# Patient Record
Sex: Female | Born: 1950
Health system: Southern US, Community
[De-identification: ages and names within clinical notes are randomized; demographics above are authoritative.]

## PROBLEM LIST (undated history)

## (undated) DIAGNOSIS — M810 Age-related osteoporosis without current pathological fracture: Secondary | ICD-10-CM

## (undated) DIAGNOSIS — I509 Heart failure, unspecified: Secondary | ICD-10-CM

## (undated) DIAGNOSIS — K219 Gastro-esophageal reflux disease without esophagitis: Secondary | ICD-10-CM

## (undated) DIAGNOSIS — R011 Cardiac murmur, unspecified: Secondary | ICD-10-CM

## (undated) DIAGNOSIS — H269 Unspecified cataract: Secondary | ICD-10-CM

## (undated) DIAGNOSIS — I10 Essential (primary) hypertension: Secondary | ICD-10-CM

## (undated) DIAGNOSIS — C801 Malignant (primary) neoplasm, unspecified: Secondary | ICD-10-CM

## (undated) DIAGNOSIS — T7840XA Allergy, unspecified, initial encounter: Secondary | ICD-10-CM

## (undated) DIAGNOSIS — I2510 Atherosclerotic heart disease of native coronary artery without angina pectoris: Secondary | ICD-10-CM

## (undated) DIAGNOSIS — E079 Disorder of thyroid, unspecified: Secondary | ICD-10-CM

## (undated) DIAGNOSIS — E785 Hyperlipidemia, unspecified: Secondary | ICD-10-CM

## (undated) DIAGNOSIS — M1990 Unspecified osteoarthritis, unspecified site: Secondary | ICD-10-CM

## (undated) HISTORY — DX: Allergy, unspecified, initial encounter: T78.40XA

## (undated) HISTORY — DX: Essential (primary) hypertension: I10

## (undated) HISTORY — PX: APPENDECTOMY: SHX54

## (undated) HISTORY — PX: CATARACT EXTRACTION: SUR2

## (undated) HISTORY — DX: Gastro-esophageal reflux disease without esophagitis: K21.9

## (undated) HISTORY — DX: Unspecified osteoarthritis, unspecified site: M19.90

## (undated) HISTORY — DX: Hyperlipidemia, unspecified: E78.5

## (undated) HISTORY — PX: CORONARY ANGIOPLASTY WITH STENT PLACEMENT: SHX49

## (undated) HISTORY — DX: Malignant (primary) neoplasm, unspecified: C80.1

## (undated) HISTORY — PX: COLONOSCOPY: SHX174

## (undated) HISTORY — DX: Cardiac murmur, unspecified: R01.1

## (undated) HISTORY — DX: Age-related osteoporosis without current pathological fracture: M81.0

## (undated) HISTORY — DX: Unspecified cataract: H26.9

## (undated) HISTORY — DX: Disorder of thyroid, unspecified: E07.9

## (undated) HISTORY — PX: ABDOMINAL HYSTERECTOMY: SHX81

---

## 2003-04-19 HISTORY — PX: CARDIAC ELECTROPHYSIOLOGY MAPPING AND ABLATION: SHX1292

## 2011-04-08 DIAGNOSIS — M341 CR(E)ST syndrome: Secondary | ICD-10-CM | POA: Insufficient documentation

## 2012-09-22 DIAGNOSIS — M1990 Unspecified osteoarthritis, unspecified site: Secondary | ICD-10-CM | POA: Insufficient documentation

## 2012-09-22 DIAGNOSIS — I7300 Raynaud's syndrome without gangrene: Secondary | ICD-10-CM | POA: Insufficient documentation

## 2014-12-05 DIAGNOSIS — F488 Other specified nonpsychotic mental disorders: Secondary | ICD-10-CM | POA: Insufficient documentation

## 2014-12-05 DIAGNOSIS — R278 Other lack of coordination: Secondary | ICD-10-CM | POA: Insufficient documentation

## 2016-05-22 ENCOUNTER — Ambulatory Visit: Payer: Self-pay | Admitting: Family Medicine

## 2016-05-23 ENCOUNTER — Ambulatory Visit: Payer: Self-pay | Admitting: Family Medicine

## 2016-05-27 DIAGNOSIS — M65332 Trigger finger, left middle finger: Secondary | ICD-10-CM | POA: Diagnosis not present

## 2016-05-27 DIAGNOSIS — M341 CR(E)ST syndrome: Secondary | ICD-10-CM | POA: Diagnosis not present

## 2016-05-27 DIAGNOSIS — I7300 Raynaud's syndrome without gangrene: Secondary | ICD-10-CM | POA: Diagnosis not present

## 2016-05-27 DIAGNOSIS — M1990 Unspecified osteoarthritis, unspecified site: Secondary | ICD-10-CM | POA: Diagnosis not present

## 2016-05-29 DIAGNOSIS — L600 Ingrowing nail: Secondary | ICD-10-CM | POA: Diagnosis not present

## 2016-05-29 DIAGNOSIS — S86912A Strain of unspecified muscle(s) and tendon(s) at lower leg level, left leg, initial encounter: Secondary | ICD-10-CM | POA: Diagnosis not present

## 2016-05-29 DIAGNOSIS — M25562 Pain in left knee: Secondary | ICD-10-CM | POA: Diagnosis not present

## 2016-05-30 DIAGNOSIS — M7652 Patellar tendinitis, left knee: Secondary | ICD-10-CM | POA: Diagnosis not present

## 2016-06-16 DIAGNOSIS — Z79899 Other long term (current) drug therapy: Secondary | ICD-10-CM | POA: Insufficient documentation

## 2016-06-16 DIAGNOSIS — M8589 Other specified disorders of bone density and structure, multiple sites: Secondary | ICD-10-CM | POA: Insufficient documentation

## 2016-06-16 DIAGNOSIS — M349 Systemic sclerosis, unspecified: Secondary | ICD-10-CM | POA: Insufficient documentation

## 2016-06-16 NOTE — Progress Notes (Signed)
Office Visit Note  Patient: Rhonda Brewer             Date of Birth: September 01, 1950           MRN: [id number]             PCP: Zigmund Gottron, MD Referring: Gwyndolyn Kaufman, MD Visit Date: 06/19/2016 Occupation: Retired Scientist, water quality    Subjective:  Pain hands   History of Present Illness: Rhonda Brewer is a 66 y.o. female seen in consultation per request of her PCP. According to patient in 2008 she was diagnosed with uterine cancer and was treated with hysterectomy, chemotherapy and radiation therapy. She states after that she developed swelling in her bilateral hands. She was referred to a rheumatologist while she was living in [place]. She was diagnosed with scleroderma and was started on Plaquenil. She states she did not require any other treatment. She had labs and eye exams every 6 months. Her Raynauds is only active when she is exposed to cold weather. She denies any reflux symptoms. She moved to [facility] in January 2018. She states she continues to have some stiffness in her hands and some Raynaud's phenomenon. She was also having problems with left third trigger finger which is improved by itself.  Activities of Daily Living:  Patient reports morning stiffness for 5 minutes.   Patient Denies nocturnal pain.  Difficulty dressing/grooming: Denies Difficulty climbing stairs: Denies Difficulty getting out of chair: Denies Difficulty using hands for taps, buttons, cutlery, and/or writing: Reports   Review of Systems  Constitutional: Negative for fatigue, night sweats, weight gain, weight loss and weakness.  HENT: Negative for mouth sores, trouble swallowing, trouble swallowing, mouth dryness and nose dryness.   Eyes: Positive for dryness. Negative for pain, redness and visual disturbance.  Respiratory: Negative for cough, shortness of breath and difficulty breathing.   Cardiovascular: Negative for chest pain, palpitations, hypertension, irregular heartbeat and  swelling in legs/feet.  Gastrointestinal: Negative for blood in stool, constipation and diarrhea.  Endocrine: Negative for increased urination.  Genitourinary: Negative for vaginal dryness.  Musculoskeletal: Positive for arthralgias, joint pain and morning stiffness. Negative for joint swelling, myalgias, muscle weakness, muscle tenderness and myalgias.  Skin: Positive for color change and skin tightness. Negative for rash, hair loss, ulcers and sensitivity to sunlight.  Allergic/Immunologic: Negative for susceptible to infections.  Neurological: Negative for dizziness, memory loss and night sweats.  Hematological: Negative for swollen glands.  Psychiatric/Behavioral: Negative for depressed mood and sleep disturbance. The patient is not nervous/anxious.     PMFS History:  Patient Active Problem List   Diagnosis Date Noted  . History of coronary artery disease 06/19/2016  . History of hypothyroidism 06/19/2016  . History of uterine cancer 06/19/2016  . Scleroderma ([place]) 06/16/2016  . High risk medication use 06/16/2016  . Osteopenia of multiple sites 06/16/2016    Past Medical History:  Diagnosis Date  . [facility])    Uterine 2008    No family history on file. Past Surgical History:  Procedure Laterality Date  . ABDOMINAL HYSTERECTOMY    . APPENDECTOMY    . CARDIAC ELECTROPHYSIOLOGY MAPPING AND ABLATION  04/19/2003  . CORONARY ANGIOPLASTY WITH STENT PLACEMENT     Social History   Social History Narrative  . No narrative on file     Objective: Vital Signs: BP 102/68   Pulse 60   Resp 16   Ht '5\' 4"'$  (1.626 m)   Wt 140 lb (63.5 kg)   BMI 24.03 kg/m  Physical Exam  Constitutional: She is oriented to person, place, and time. She appears well-developed and well-nourished.  HENT:  Head: Normocephalic and atraumatic.  Eyes: Conjunctivae and EOM are normal.  Neck: Normal range of motion.  Cardiovascular: Normal rate, regular rhythm, normal heart sounds and intact  distal pulses.   Pulmonary/Chest: Effort normal and breath sounds normal.  Abdominal: Soft. Bowel sounds are normal.  Lymphadenopathy:    She has no cervical adenopathy.  Neurological: She is alert and oriented to person, place, and time.  Skin: Skin is warm and dry. Capillary refill takes 2 to 3 seconds.  sclerodactyly noted on bilateral hands distal to MCPs. 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  Psychiatric: She has a normal mood and affect. Her behavior is normal.  Nursing note and vitals reviewed.    Musculoskeletal Exam: C-spine and thoracic lumbar spine good range of motion. No SI joint tenderness noted. Shoulder joints elbow joints wrist joint MCPs PIPs DIPs with good range of motion. She has some thickening of PIP joints consistent with osteoarthritis. Hip joints knee joints ankles MTPs PIPs with good range of motion with no synovitis.  CDAI Exam: No CDAI exam completed.    Investigation: Findings:  11/04/2015 ANA 1:2560 centromere,(dsDNA, SSA, SSB, Smith, RNP, SCL 70, Jo 1, chromatin negative), ESR 2, CRP less than 0.3, 08/31/2014 CMP normal, CBC normal, CK normal, anti-CCP antibody negative    Imaging: Xr Hand 2 View Left  Result Date: 06/19/2016 PIP DIP narrowing were noted. There was questionable erosion over right second and and fifth PIP joint. Impression: These findings are consistent with osteoarthritis and inflammatory arthritis.  Xr Hand 2 View Right  Result Date: 06/19/2016 Possible erosion noted over first distal phalanx minimal PIP/DIP narrowing noted. No MCP or intercarpal joint space narrowing was noted. Impression: These findings are consistent with osteoarthritis and inflammatory arthritis.   Speciality Comments: No specialty comments available.    Procedures:  No procedures performed Allergies: Avelox [moxifloxacin hcl in nacl]   Assessment / Plan:     Visit Diagnoses: Scleroderma ([place]) - Limited systemic with Raynauds,  Telengectesia's, sclerodactyly, arthralgias, erosions in right fifth and left third DIP, ANA centromere -she is sclerodactyly only distal to MCPs. I'll obtain some basic labs today. Plan: Urinalysis, Routine w reflex microscopic. I'll also make referral for cardiology and pulmonary evaluation.  High risk medication use - Hydroxychloroquine 200 mg twice a day , I advised patient to reduce her Plaquenil to 200 mg twice a day Monday to Friday is tolerable height. She's been getting eye exams every 6 months. I've advised her to establish with ophthalmologist.- Plan: CBC with Differential/Platelet, COMPLETE METABOLIC PANEL WITH GFR  Pain in both hands, she had no synovitis on examination today. - Plan: XR Hand 2 View Right, XR Hand 2 View Left  Osteopenia of multiple sites - T score -1.1 lumbar 2014: She is on supplements.  History of coronary artery disease -  status post stent  History of hypothyroidism  History of uterine cancer - 2008, status post hysterectomy, chemotherapy, radiation therapy    Orders: Orders Placed This Encounter  Procedures  . XR Hand 2 View Right  . XR Hand 2 View Left  . CBC with Differential/Platelet  . COMPLETE METABOLIC PANEL WITH GFR  . Urinalysis, Routine w reflex microscopic  . CBC with Differential/Platelet  . COMPLETE METABOLIC PANEL WITH GFR  . Ambulatory referral to Pulmonology  . AMB referral to CHF clinic   No orders of the defined  types were placed in this encounter.   Face-to-face time spent with patient was 45 minutes. 50% of time was spent in counseling and coordination of care.  Follow-Up Instructions: Return in about 6 months (around 12/19/2016) for Scleroderma.   Bo Merino, MD  Note - This record has been created using Editor, commissioning.  Chart creation errors have been sought, but may not always  have been located. Such creation errors do not reflect on  the standard of medical care.

## 2016-06-19 ENCOUNTER — Ambulatory Visit (INDEPENDENT_AMBULATORY_CARE_PROVIDER_SITE_OTHER): Payer: Medicare Other

## 2016-06-19 ENCOUNTER — Ambulatory Visit (INDEPENDENT_AMBULATORY_CARE_PROVIDER_SITE_OTHER): Payer: Medicare Other | Admitting: Rheumatology

## 2016-06-19 ENCOUNTER — Ambulatory Visit (INDEPENDENT_AMBULATORY_CARE_PROVIDER_SITE_OTHER): Payer: Self-pay

## 2016-06-19 ENCOUNTER — Encounter: Payer: Self-pay | Admitting: Rheumatology

## 2016-06-19 VITALS — BP 102/68 | HR 60 | Resp 16 | Ht 64.0 in | Wt 140.0 lb

## 2016-06-19 DIAGNOSIS — Z8542 Personal history of malignant neoplasm of other parts of uterus: Secondary | ICD-10-CM | POA: Diagnosis not present

## 2016-06-19 DIAGNOSIS — M8589 Other specified disorders of bone density and structure, multiple sites: Secondary | ICD-10-CM

## 2016-06-19 DIAGNOSIS — M79642 Pain in left hand: Secondary | ICD-10-CM

## 2016-06-19 DIAGNOSIS — Z8639 Personal history of other endocrine, nutritional and metabolic disease: Secondary | ICD-10-CM | POA: Diagnosis not present

## 2016-06-19 DIAGNOSIS — Z79899 Other long term (current) drug therapy: Secondary | ICD-10-CM | POA: Diagnosis not present

## 2016-06-19 DIAGNOSIS — Z8679 Personal history of other diseases of the circulatory system: Secondary | ICD-10-CM | POA: Diagnosis not present

## 2016-06-19 DIAGNOSIS — M79641 Pain in right hand: Secondary | ICD-10-CM

## 2016-06-19 DIAGNOSIS — M349 Systemic sclerosis, unspecified: Secondary | ICD-10-CM

## 2016-06-19 DIAGNOSIS — I2510 Atherosclerotic heart disease of native coronary artery without angina pectoris: Secondary | ICD-10-CM | POA: Insufficient documentation

## 2016-06-19 LAB — COMPLETE METABOLIC PANEL WITH GFR
ALT: 24 U/L (ref 6–29)
AST: 27 U/L (ref 10–35)
Albumin: 4.4 g/dL (ref 3.6–5.1)
Alkaline Phosphatase: 64 U/L (ref 33–130)
BUN: 14 mg/dL (ref 7–25)
CALCIUM: 9.5 mg/dL (ref 8.6–10.4)
CHLORIDE: 104 mmol/L (ref 98–110)
CO2: 24 mmol/L (ref 20–31)
Creat: 0.79 mg/dL (ref 0.50–0.99)
GFR, Est African American: >89 mL/min (ref >=60)
GFR, Est Non African American: 79 mL/min (ref >=60)
GLUCOSE: 82 mg/dL (ref 65–99)
Potassium: 4.2 mmol/L (ref 3.5–5.3)
SODIUM: 139 mmol/L (ref 135–146)
Total Bilirubin: 0.7 mg/dL (ref 0.2–1.2)
Total Protein: 6.8 g/dL (ref 6.1–8.1)

## 2016-06-19 LAB — CBC WITH DIFFERENTIAL/PLATELET
BASOS PCT: 0 %
Basophils Absolute: 0 cells/uL (ref 0–200)
Eosinophils Absolute: 50 cells/uL (ref 15–500)
Eosinophils Relative: 1 %
HCT: 44.9 % (ref 35.0–45.0)
Hemoglobin: 14.8 g/dL (ref 11.7–15.5)
LYMPHS PCT: 28 %
Lymphs Abs: 1400 cells/uL (ref 850–3900)
MCH: 31.7 pg (ref 27.0–33.0)
MCHC: 33 g/dL (ref 32.0–36.0)
MCV: 96.1 fL (ref 80.0–100.0)
MONO ABS: 500 {cells}/uL (ref 200–950)
MPV: 11.1 fL (ref 7.5–12.5)
Monocytes Relative: 10 %
Neutro Abs: 3050 cells/uL (ref 1500–7800)
Neutrophils Relative %: 61 %
PLATELETS: 88 10*3/uL — AB (ref 140–400)
RBC: 4.67 MIL/uL (ref 3.80–5.10)
RDW: 13.7 % (ref 11.0–15.0)
WBC: 5 10*3/uL (ref 3.8–10.8)

## 2016-06-19 NOTE — Progress Notes (Signed)
Pharmacy Note  Subjective: Patient presents today to the [facility] to see Dr. Estanislado Pandy.  Patient is currently taking hydroxychloroquine 200 mg BID prescribed by previous rheumatologist.  Patient seen by the pharmacist for counseling on hydroxychloroquine.    Objective: CBC, CMP ordered today  Assessment/Plan: Patient was counseled to take hydroxychloroquine 200 mg BID Monday through Friday.  Patient was counseled on the purpose, proper use, and adverse effects of hydroxychloroquine including nausea/diarrhea, skin rash, headaches, and sun sensitivity.  Discussed importance of annual eye exams while on hydroxychloroquine to monitor to ocular toxicity and discussed importance of frequent laboratory monitoring.  Provided patient with eye exam form and standing lab instructions.  Provided patient with educational materials on hydroxychloroquine and answered all questions.  Patient consented to hydroxychloroquine.  Will upload consent in the media tab.    Elisabeth Most, Pharm.D., BCPS Clinical Pharmacist Pager: [phone] Phone: [phone] 06/19/2016 9:58 AM

## 2016-06-19 NOTE — Patient Instructions (Addendum)
Standing Labs We placed an order today for your standing lab work.    Please come back and get your standing labs in 5 months  We have open lab Monday through Friday from 8:30-11:30 AM and 1:30-4 PM at the office of Dr. Tresa Moore, PA.   The office is located at [address] [place], [place], [place] [address] No appointment is necessary.   Labs are drawn by [facility].  You may receive a bill from [facility] for your lab work.       See your eye doctor, if you do not have one you can try Dr Katy Fitch, his office is close to ours, the phone number is [phone]    Hydroxychloroquine tablets What is this medicine? HYDROXYCHLOROQUINE (hye drox ee KLOR oh kwin) is used to treat rheumatoid arthritis and systemic lupus erythematosus. It is also used to treat malaria. This medicine may be used for other purposes; ask your health care provider or pharmacist if you have questions. COMMON BRAND NAME(S): Plaquenil, Quineprox What should I tell my health care provider before I take this medicine? They need to know if you have any of these conditions: -diabetes -eye disease, vision problems -G6PD deficiency -history of blood diseases -history of irregular heartbeat -if you often drink alcohol -kidney disease -liver disease -porphyria -psoriasis -seizures -an unusual or allergic reaction to chloroquine, hydroxychloroquine, other medicines, foods, dyes, or preservatives -pregnant or trying to get pregnant -breast-feeding How should I use this medicine? Take this medicine by mouth with a glass of water. Follow the directions on the prescription label. Avoid taking antacids within 4 hours of taking this medicine. It is best to separate these medicines by at least 4 hours. Do not cut, crush or chew this medicine. You can take it with or without food. If it upsets your stomach, take it with food. Take your medicine at regular intervals. Do not take your medicine more often  than directed. Take all of your medicine as directed even if you think you are better. Do not skip doses or stop your medicine early. Talk to your pediatrician regarding the use of this medicine in children. While this drug may be prescribed for selected conditions, precautions do apply. Overdosage: If you think you have taken too much of this medicine contact a poison control center or emergency room at once. NOTE: This medicine is only for you. Do not share this medicine with others. What if I miss a dose? If you miss a dose, take it as soon as you can. If it is almost time for your next dose, take only that dose. Do not take double or extra doses. What may interact with this medicine? Do not take this medicine with any of the following medications: -cisapride -dofetilide -dronedarone -live virus vaccines -penicillamine -pimozide -thioridazine -ziprasidone This medicine may also interact with the following medications: -ampicillin -antacids -cimetidine -cyclosporine -digoxin -medicines for diabetes, like insulin, glipizide, glyburide -medicines for seizures like carbamazepine, phenobarbital, phenytoin -mefloquine -methotrexate -other medicines that prolong the QT interval (cause an abnormal heart rhythm) -praziquantel This list may not describe all possible interactions. Give your health care provider a list of all the medicines, herbs, non-prescription drugs, or dietary supplements you use. Also tell them if you smoke, drink alcohol, or use illegal drugs. Some items may interact with your medicine. What should I watch for while using this medicine? Tell your doctor or healthcare professional if your symptoms do not start to get better or if they get  worse. Avoid taking antacids within 4 hours of taking this medicine. It is best to separate these medicines by at least 4 hours. Tell your doctor or health care professional right away if you have any change in your eyesight. Your  vision and blood may be tested before and during use of this medicine. This medicine can make you more sensitive to the sun. Keep out of the sun. If you cannot avoid being in the sun, wear protective clothing and use sunscreen. Do not use sun lamps or tanning beds/booths. What side effects may I notice from receiving this medicine? Side effects that you should report to your doctor or health care professional as soon as possible: -allergic reactions like skin rash, itching or hives, swelling of the face, lips, or tongue -changes in vision -decreased hearing or ringing of the ears -redness, blistering, peeling or loosening of the skin, including inside the mouth -seizures -sensitivity to light -signs and symptoms of a dangerous change in heartbeat or heart rhythm like chest pain; dizziness; fast or irregular heartbeat; palpitations; feeling faint or lightheaded, falls; breathing problems -signs and symptoms of liver injury like dark yellow or brown urine; general ill feeling or flu-like symptoms; light-colored stools; loss of appetite; nausea; right upper belly pain; unusually weak or tired; yellowing of the eyes or skin -signs and symptoms of low blood sugar such as feeling anxious; confusion; dizziness; increased hunger; unusually weak or tired; sweating; shakiness; cold; irritable; headache; blurred vision; fast heartbeat; loss of consciousness -uncontrollable head, mouth, neck, arm, or leg movements Side effects that usually do not require medical attention (report to your doctor or health care professional if they continue or are bothersome): -anxious -diarrhea -dizziness -hair loss -headache -irritable -loss of appetite -nausea, vomiting -stomach pain This list may not describe all possible side effects. Call your doctor for medical advice about side effects. You may report side effects to FDA at 1-800-FDA-1088. Where should I keep my medicine? Keep out of the reach of children. In  children, this medicine can cause overdose with small doses. Store at room temperature between 15 and 30 degrees C (59 and 86 degrees F). Protect from moisture and light. Throw away any unused medicine after the expiration date. NOTE: This sheet is a summary. It may not cover all possible information. If you have questions about this medicine, talk to your doctor, pharmacist, or health care provider.  2018 Elsevier/[employer] Standard (2015-10-18 14:16:15)

## 2016-06-20 LAB — URINALYSIS, ROUTINE W REFLEX MICROSCOPIC
BILIRUBIN URINE: NEGATIVE
GLUCOSE, UA: NEGATIVE
Hgb urine dipstick: NEGATIVE
KETONES UR: NEGATIVE
Leukocytes, UA: NEGATIVE
Nitrite: NEGATIVE
PH: 5.5 (ref 5.0–8.0)
Protein, ur: NEGATIVE
SPECIFIC GRAVITY, URINE: 1.015 (ref 1.001–1.035)

## 2016-06-20 NOTE — Progress Notes (Signed)
Labs normal.

## 2016-06-26 ENCOUNTER — Ambulatory Visit (INDEPENDENT_AMBULATORY_CARE_PROVIDER_SITE_OTHER): Payer: Medicare Other | Admitting: Family Medicine

## 2016-06-26 ENCOUNTER — Encounter: Payer: Self-pay | Admitting: Family Medicine

## 2016-06-26 ENCOUNTER — Telehealth: Payer: Self-pay | Admitting: *Deleted

## 2016-06-26 DIAGNOSIS — R1012 Left upper quadrant pain: Secondary | ICD-10-CM | POA: Diagnosis not present

## 2016-06-26 DIAGNOSIS — Z8679 Personal history of other diseases of the circulatory system: Secondary | ICD-10-CM | POA: Diagnosis not present

## 2016-06-26 DIAGNOSIS — I2510 Atherosclerotic heart disease of native coronary artery without angina pectoris: Secondary | ICD-10-CM | POA: Diagnosis not present

## 2016-06-26 DIAGNOSIS — Z8542 Personal history of malignant neoplasm of other parts of uterus: Secondary | ICD-10-CM | POA: Diagnosis not present

## 2016-06-26 DIAGNOSIS — Z9889 Other specified postprocedural states: Secondary | ICD-10-CM

## 2016-06-26 DIAGNOSIS — Z8639 Personal history of other endocrine, nutritional and metabolic disease: Secondary | ICD-10-CM | POA: Diagnosis not present

## 2016-06-26 MED ORDER — RANITIDINE HCL 300 MG PO TABS: 300.0000 mg | ORAL_TABLET | Freq: Every day | ORAL | 3 refills | Status: DC

## 2016-06-26 MED ORDER — TICAGRELOR 90 MG PO TABS: ORAL_TABLET | ORAL | 3 refills | Status: DC

## 2016-06-26 MED ORDER — FAMOTIDINE 40 MG PO TABS: 40.0000 mg | ORAL_TABLET | Freq: Every day | ORAL | 2 refills | Status: DC

## 2016-06-26 NOTE — Progress Notes (Signed)
   Subjective:    Patient ID: Rhonda Brewer, female    DOB: [birth year], 66 y.o.   MRN: [id number]  HPI  Just moved to area and here to establish care with me as PCP.  Note Multiple issues. 1. Scleroderma.  On plaquenil.  Already established with rheum.  No issues. 2. Recent cardiac stent.  Sept 2017.  On brilinta, aspirin and arorvastatin.  No chest pain.  Needs cardiologist.  Already referred to [facility], Dr. Tempie Hoist.   3. Suggested to have a pulm referral by rheum.  4. Hypothyroid on replacement.  No symptoms.  Believes had TSH done 11/2015 5. HX of uterine cancer.  Apparently cured with hysterectomy, radiation on chemo.   6. New symptom of left upper quadrent abd pain on and off for two months.  No pattern - maybe worse on empty stomach.  No change in bowel or bladder.  No bleeding, nausea or wt loss.  Has seen GI remotely - not for this.  Brother with history of "stomach cancer" so she gets q5y colonoscopy.  Remote hx of EGD.  Recent CBC and LFTs normal.   7 Believes that she is up to date with HPDP - but is unsure about pneumonia vaccines.      Review of Systems     Objective:   Physical Exam VS noted For all her PMHx, she is healthy appearing. HEENT normal Neck supple without thyromegally Lungs clear Cardiac RRR without m or g Abd, some mild Lt UQ tenderness, no rebound Ext no edema. Neuro WNL        Assessment & Plan:

## 2016-06-26 NOTE — Telephone Encounter (Signed)
Patient informed. 

## 2016-06-26 NOTE — Assessment & Plan Note (Addendum)
Refill brillinta.  Keep cards referral Request records from previous cardiologist.  Check direct LDL.

## 2016-06-26 NOTE — Patient Instructions (Addendum)
I refilled brilinta. I will give you a written prescription for famotidine, an acid reducing medicine.  Let me know if that helps your discomfort. I will call with the results of the blood tests My nurse will get you to sign several release of informations so that we can get your records. Call me if the abd pain worsens and I will order more tests. Please make appoints with the pulmonologist and cardiologist. See me in 4-6 weeks.  I want enough time for your records to get here.

## 2016-06-26 NOTE — Telephone Encounter (Signed)
Made change and sent to [facility].

## 2016-06-26 NOTE — Assessment & Plan Note (Signed)
Done for arrythmia (type?) 2005

## 2016-06-26 NOTE — Assessment & Plan Note (Signed)
Will get PCP records.

## 2016-06-26 NOTE — Telephone Encounter (Signed)
Famotidine not covered by insurance, pharmacy requesting formulary alternative ranitidine be sent in instead. Will forward to PCP.

## 2016-06-26 NOTE — Assessment & Plan Note (Signed)
Will get gyn and onc records.

## 2016-06-26 NOTE — Assessment & Plan Note (Addendum)
Check amylase and h pylori.  Therapeutic trial of H2 blocker.

## 2016-06-27 LAB — LDL CHOLESTEROL, DIRECT: LDL DIRECT: 57 mg/dL (ref 0–99)

## 2016-06-27 LAB — AMYLASE: Amylase: 85 U/L (ref 31–124)

## 2016-07-19 ENCOUNTER — Ambulatory Visit: Payer: Self-pay | Admitting: Rheumatology

## 2016-07-22 DIAGNOSIS — Z79899 Other long term (current) drug therapy: Secondary | ICD-10-CM | POA: Diagnosis not present

## 2016-07-22 DIAGNOSIS — M0600 Rheumatoid arthritis without rheumatoid factor, unspecified site: Secondary | ICD-10-CM | POA: Diagnosis not present

## 2016-07-22 DIAGNOSIS — H2513 Age-related nuclear cataract, bilateral: Secondary | ICD-10-CM | POA: Diagnosis not present

## 2016-07-24 ENCOUNTER — Telehealth (HOSPITAL_COMMUNITY): Payer: Self-pay | Admitting: *Deleted

## 2016-07-24 ENCOUNTER — Telehealth (HOSPITAL_COMMUNITY): Payer: Self-pay | Admitting: Vascular Surgery

## 2016-07-24 DIAGNOSIS — M349 Systemic sclerosis, unspecified: Secondary | ICD-10-CM

## 2016-07-24 NOTE — Telephone Encounter (Signed)
Left pt messag eto make new pt Pulm w/ echo and PFT

## 2016-07-24 NOTE — Telephone Encounter (Signed)
Received referral from Dr Estanislado Pandy, pt needs echo, pfts and appt w/Dr Bensimhon for scleroderma, pulm htn eval.  Orders placed, will scheduled

## 2016-07-25 ENCOUNTER — Ambulatory Visit (INDEPENDENT_AMBULATORY_CARE_PROVIDER_SITE_OTHER): Payer: Medicare Other | Admitting: Family Medicine

## 2016-07-25 ENCOUNTER — Encounter: Payer: Self-pay | Admitting: Family Medicine

## 2016-07-25 DIAGNOSIS — R1012 Left upper quadrant pain: Secondary | ICD-10-CM

## 2016-07-25 DIAGNOSIS — R21 Rash and other nonspecific skin eruption: Secondary | ICD-10-CM

## 2016-07-25 MED ORDER — RANITIDINE HCL 300 MG PO TABS: 300.0000 mg | ORAL_TABLET | Freq: Every day | ORAL | 3 refills | Status: DC

## 2016-07-25 MED ORDER — KETOCONAZOLE 2 % EX CREA: 1.0000 "application " | TOPICAL_CREAM | Freq: Two times a day (BID) | CUTANEOUS | 0 refills | Status: DC

## 2016-07-25 NOTE — Assessment & Plan Note (Signed)
Resolved on ranitidine.  Refill and continue.

## 2016-07-25 NOTE — Progress Notes (Signed)
   Subjective:    Patient ID: Rhonda Brewer, female    DOB: 12-21-1950, 66 y.o.   MRN: [id number]  HPI Several issues: My second visit with this patient. 1. Left upper quadrent/epigastric pain has resolved on ranitidine.   2. No significant medical records yet.  I believe she is largely up to date on HPDP.  She tells me that she had colonoscopy, 01/2014, Mammo Sept 2017, Tetanus 07/2011.  She is unclear on pneumonia vaccine - thinks maybe she got one.  Also doubts that she ever had HIV or Hep C screen.  She is at low risk.   3. Rash on right trunk.  Mildly pruritic.  Seems to be ggetting larger.No tick bites. 4. Getting plugged into local [facility] specialists.  Deveshwar, rheum: Bensimon, cards: Rameswamey, Pulm.  Wants to establish with local gyn physician.  Otherwise feels great.    Review of Systems     Objective:   Physical ExamLungs clear Cardiac RRR without m or g Three distinct lesions with active border and central clearing.  Not scaley,  Could not scrape enough off to do a KOH.        Assessment & Plan:

## 2016-07-25 NOTE — Patient Instructions (Addendum)
I have only received hand X rays from your hospital, no other records. What I most need is: Have you ever had a Hepatitis C or HIV test.  Have you had a pneumonia vaccine?  If yes, when and which one - there are two.  I also need your bone density scan results. Please schedule an annual Medicare Wellness visit with my nurse at your convenience.   It will help my grade if I get a copy of your colonoscopy, tetanus shot and mammogram. For a Gyn doctor, I recommend either [facility] or [facility].  Both are part of [facility]. Send me a message via MyChart in a month or so to remind me to find out what records have and have not come in.

## 2016-07-25 NOTE — Assessment & Plan Note (Addendum)
Exam consistent with tinea corporis  Attempted to scrape - not enough dead skin. Will treat empirically.

## 2016-07-30 ENCOUNTER — Institutional Professional Consult (permissible substitution): Payer: Medicare Other | Admitting: Internal Medicine

## 2016-08-13 ENCOUNTER — Encounter: Payer: Self-pay | Admitting: Family Medicine

## 2016-08-16 ENCOUNTER — Encounter: Payer: Self-pay | Admitting: Internal Medicine

## 2016-08-16 ENCOUNTER — Ambulatory Visit (INDEPENDENT_AMBULATORY_CARE_PROVIDER_SITE_OTHER): Payer: Medicare Other | Admitting: Internal Medicine

## 2016-08-16 DIAGNOSIS — R0600 Dyspnea, unspecified: Secondary | ICD-10-CM | POA: Diagnosis not present

## 2016-08-16 DIAGNOSIS — R0689 Other abnormalities of breathing: Secondary | ICD-10-CM | POA: Diagnosis not present

## 2016-08-16 NOTE — Assessment & Plan Note (Signed)
Higher concern is coronary artery related Lower concern for intersistial lung disease   Plan  - respect your desire and agree to hold off getting HRCT chest right now  - agree with PFT and ECHO 08/27/16 - definitely see Dr Jeffie Pollock 08/27/16; but any worsening go to ER  Followup 2 months or sooner if needed to review progress. Might need CT chest based on course

## 2016-08-16 NOTE — Progress Notes (Signed)
Subjective:    Patient ID: Rhonda Brewer, female    DOB: 11/30/1950, 66 y.o.   MRN: [id number]  PCP Rhonda Resides, MD   HPI  IOV 08/16/2016  Chief Complaint  Patient presents with  . Advice Only    Referred by Dr. Estanislado Pandy for scleroderma.  c/o worsening sob, chest tightness with exertion X1 month.     S: See resident physician for details. She has scleroderma for over 10 years for which she is on Actonel. Denies any associated acid reflux. Started on was believed only to involve the skin. I personally evaluated the history that this patient had a few months of shortness of breath in 2017 between summer and fall that then resulted in worsening dyspnea on exertion. That then resulted in a cardiac stent. After this dyspnea resolved. Then subsequently in January 2018 moved from the [place] area to [place], [place]. Now for the last 1 month she's having recurrent dyspnea on exertion. She feels this is from the heart. She does not think is a lung issue. Relieved by rest. She notices it for climbing stairs relieved by rest. She has cardiology appointment pending. There is a pulmonary function test and echocardiogram pending on 08/27/2016. She is reluctant to get a CT chest. Walking desat test in office 185 feet x  3 laps on RA: 100% at rest and exertion  Recent pertinent labs  Results for Rhonda, Brewer (MRN [id number]) as of 08/16/2016 10:29  Ref. Range 06/19/2016 10:38 06/26/2016 10:04  Creatinine Latest Ref Range: 0.50 - 0.99 mg/dL 0.79   Results for Rhonda, Brewer (MRN [id number]) as of 08/16/2016 10:29  Ref. Range 06/19/2016 10:38 06/26/2016 10:04  Hemoglobin Latest Ref Range: 11.7 - 15.5 g/dL 14.8      has a past medical history of Cancer ([place]).   reports that she has never smoked. She has never used smokeless tobacco.  Past Surgical History:  Procedure Laterality Date  . ABDOMINAL HYSTERECTOMY    . APPENDECTOMY    . CARDIAC ELECTROPHYSIOLOGY MAPPING AND ABLATION   04/19/2003  . CORONARY ANGIOPLASTY WITH STENT PLACEMENT      Allergies  Allergen Reactions  . Avelox [Moxifloxacin Hcl In Nacl]     dizziness    Immunization History  Administered Date(s) Administered  . Influenza Split 01/17/2016    Family History  Problem Relation Age of Onset  . Asthma Maternal Aunt      Current Outpatient Prescriptions:  .  aspirin EC 81 MG tablet, Take by mouth., Disp: , Rfl:  .  atorvastatin (LIPITOR) 40 MG tablet, Take by mouth., Disp: , Rfl:  .  Estriol 10 % CREA, by Does not apply route., Disp: , Rfl:  .  glucosamine-chondroitin 500-400 MG tablet, Take by mouth., Disp: , Rfl:  .  hydroxychloroquine (PLAQUENIL) 200 MG tablet, Take 200 mg by mouth 2 (two) times daily. Monday through Friday, Disp: , Rfl:  .  ketoconazole (NIZORAL) 2 % cream, Apply 1 application topically 2 (two) times daily., Disp: 30 g, Rfl: 0 .  levothyroxine (SYNTHROID) 75 MCG tablet, Take 75 mcg by mouth daily before breakfast. , Disp: , Rfl:  .  Multiple Vitamin (MULTI-VITAMINS) TABS, Take by mouth., Disp: , Rfl:  .  ranitidine (ZANTAC) 300 MG tablet, Take 1 tablet (300 mg total) by mouth at bedtime. Replaces famotidine Rx., Disp: 90 tablet, Rfl: 3 .  ticagrelor (BRILINTA) 90 MG TABS tablet, 1 Q 12 H, Disp: 180 tablet, Rfl: 3 .  vitamin B-12 (CYANOCOBALAMIN)  100 MCG tablet, Take 100 mcg by mouth daily., Disp: , Rfl:     Review of Systems  Constitutional: Negative for fever and unexpected weight change.  HENT: Negative for congestion, dental problem, ear pain, nosebleeds, postnasal drip, rhinorrhea, sinus pressure, sneezing, sore throat and trouble swallowing.   Eyes: Negative for redness and itching.  Respiratory: Positive for chest tightness, shortness of breath and wheezing. Negative for cough.   Cardiovascular: Negative for palpitations and leg swelling.  Gastrointestinal: Negative for nausea and vomiting.  Genitourinary: Negative for dysuria.  Musculoskeletal: Negative for  joint swelling.  Skin: Negative for rash.  Neurological: Negative for headaches.  Hematological: Does not bruise/bleed easily.  Psychiatric/Behavioral: Negative for dysphoric mood. The patient is not nervous/anxious.        Objective:   Physical Exam  Vitals:   08/16/16 1023  BP: 124/66  Pulse: 62  SpO2: 97%  Weight: 135 lb (61.2 kg)  Height: 5\' 4"  (1.626 m)    Estimated body mass index is 23.17 kg/m as calculated from the following:   Height as of this encounter: 5\' 4"  (1.626 m).   Weight as of this encounter: 135 lb (61.2 kg).  Obvious scleroderma ? Malar rash No oral ulcers No photosensitivity No loud  second heart sound Pulmonic component No basal crackles  Walking desaturation test 185 feet 3 laps on room air    Assessment & Plan:  Dyspnea and respiratory abnormalities Higher concern is coronary artery related Lower concern for intersistial lung disease   Plan  - respect your desire and agree to hold off getting HRCT chest right now  - agree with PFT and ECHO 08/27/16 - definitely see Dr Jeffie Pollock 08/27/16; but any worsening go to ER  Followup 2 months or sooner if needed to review progress. Might need CT chest based on course     Rest per resident   Dr. Brand Males, M.D., [facility].C.P Pulmonary and Critical Care Medicine Staff Physician [facility] Pager: [phone], If no answer or between  15:00h - 7:00h: call [phone]  08/16/2016 11:20 AM

## 2016-08-16 NOTE — Progress Notes (Signed)
Subjective:     Patient ID: Rhonda Brewer, female   DOB: 03-08-1951, 66 y.o.   MRN: [id number]  HPI 66 year old woman with history of scleroderma on hydroxychloroquine, CAD s/p stent 11/2015, uterine cancer dx in 2008 s/p hysterectomy, chemo/radtx presenting for evaluation of dyspnea on exertion.  She is followed by Dr. Patrecia Pour for rheumatology, Dr. Haroldine Laws for cardiology. Previously was followed by Dr. Gwynneth Aliment in [place]. Last saw Dr. Estanislado Pandy 06/19/2016. She has had scleroderma for 10 years and only has been on hydroxychloroquine. She moved to [place] in January 2018.  For the past month, she has dyspnea with going up one flight of stairs most times. This is new. Also sometimes gets short of breath with bending over to pick something up. Denies cough. She does yoga, strength training, and walking for exercise. She can walk a couple of miles without any issues. She is independent in all her activities of daily living. Does not use any devices to aid in walking. Denies GERD. Has not had a pulmonologist.  She had shortness of breath that prompted the cardiac evaluation leading to stent placement. She has an appointment with cardiology June 12. Review of Systems  Constitutional: Negative for chills, fever and unexpected weight change.  HENT: Negative for congestion.   Eyes: Negative for visual disturbance.  Respiratory: Negative for shortness of breath and wheezing.   Cardiovascular: Negative for chest pain.  Gastrointestinal: Negative for abdominal pain, nausea and vomiting.  Endocrine: Negative for polydipsia and polyuria.  Genitourinary: Negative for dysuria.  Musculoskeletal: Negative for myalgias and neck stiffness.  Skin: Negative for rash.  Neurological: Negative for dizziness and light-headedness.    has a past medical history of Cancer ([place]).    reports that she has never smoked. She has never used smokeless tobacco.  Past Surgical History:  Procedure Laterality Date  .  ABDOMINAL HYSTERECTOMY    . APPENDECTOMY    . CARDIAC ELECTROPHYSIOLOGY MAPPING AND ABLATION  04/19/2003  . CORONARY ANGIOPLASTY WITH STENT PLACEMENT     Allergies  Allergen Reactions  . Avelox [Moxifloxacin Hcl In Nacl]     dizziness   Immunization History  Administered Date(s) Administered  . Influenza Split 01/17/2016   Family History  Problem Relation Age of Onset  . Asthma Maternal Aunt     Current Outpatient Prescriptions:  .  aspirin EC 81 MG tablet, Take by mouth., Disp: , Rfl:  .  atorvastatin (LIPITOR) 40 MG tablet, Take by mouth., Disp: , Rfl:  .  Estriol 10 % CREA, by Does not apply route., Disp: , Rfl:  .  glucosamine-chondroitin 500-400 MG tablet, Take by mouth., Disp: , Rfl:  .  hydroxychloroquine (PLAQUENIL) 200 MG tablet, Take 200 mg by mouth 2 (two) times daily. Monday through Friday, Disp: , Rfl:  .  ketoconazole (NIZORAL) 2 % cream, Apply 1 application topically 2 (two) times daily., Disp: 30 g, Rfl: 0 .  levothyroxine (SYNTHROID) 75 MCG tablet, Take 75 mcg by mouth daily before breakfast. , Disp: , Rfl:  .  Multiple Vitamin (MULTI-VITAMINS) TABS, Take by mouth., Disp: , Rfl:  .  ranitidine (ZANTAC) 300 MG tablet, Take 1 tablet (300 mg total) by mouth at bedtime. Replaces famotidine Rx., Disp: 90 tablet, Rfl: 3 .  ticagrelor (BRILINTA) 90 MG TABS tablet, 1 Q 12 H, Disp: 180 tablet, Rfl: 3 .  vitamin B-12 (CYANOCOBALAMIN) 100 MCG tablet, Take 100 mcg by mouth daily., Disp: , Rfl:      Objective:   Physical Exam General  Apperance: NAD Head: Normocephalic, atraumatic Eyes: PERRL, EOMI, anicteric sclera Ears: Normal external ear canal Nose: Nares normal, septum midline, mucosa normal Throat: Lips, mucosa and tongue normal  Neck: Supple, trachea midline Back: No tenderness or bony abnormality  Lungs: Clear to auscultation bilaterally. No wheezes. Breathing comfortably on room air Chest Wall: Nontender, no deformity Heart: Regular rate and rhythm, no  murmur/rub/gallop Abdomen: Soft, nontender, nondistended, no rebound/guarding Extremities: Normal, atraumatic, warm and well perfused, no edema, no clubbing Pulses: 2+ throughout Skin: Hypopigmentation of bilateral fingers Neurologic: Alert and oriented x 3. CNII-XII intact. Normal strength and sensation  Echo 12/05/2014 with LV EF > 55%, no abnormalities.  No desaturation with ambulation    Assessment:     Scleroderma Dyspnea    Plan:     At risk for developing pulmonary fibrosis with scleroderma. Possible that her symptoms are cardiac in etiology. PFTs already ordered by cardiology. Will have her follow up following her cardiac evaluation.   Jacques Earthly, MD  Internal Medicine PGY-3 08/16/16 11:01 AM

## 2016-08-16 NOTE — Patient Instructions (Signed)
Dyspnea and respiratory abnormalities Higher concern is coronary artery related Lower concern for intersistial lung disease   Plan  - respect your desire and agree to hold off getting HRCT chest right now  - agree with PFT and ECHO 08/27/16 - definitely see Dr Jeffie Pollock 08/27/16; but any worsening go to ER  Followup 2 months or sooner if needed to review progress. Might need CT chest based on course

## 2016-08-27 ENCOUNTER — Encounter (HOSPITAL_COMMUNITY): Payer: Self-pay | Admitting: Internal Medicine

## 2016-08-27 ENCOUNTER — Ambulatory Visit (HOSPITAL_BASED_OUTPATIENT_CLINIC_OR_DEPARTMENT_OTHER)
Admission: RE | Admit: 2016-08-27 | Discharge: 2016-08-27 | Disposition: A | Discharge status: Home / Self Care | Payer: Medicare Other | Source: Ambulatory Visit | Attending: Internal Medicine | Admitting: Internal Medicine

## 2016-08-27 ENCOUNTER — Ambulatory Visit (HOSPITAL_COMMUNITY)
Admission: RE | Admit: 2016-08-27 | Discharge: 2016-08-27 | Disposition: A | Discharge status: Home / Self Care | Payer: Medicare Other | Source: Ambulatory Visit | Attending: Internal Medicine | Admitting: Internal Medicine

## 2016-08-27 VITALS — BP 130/72 | HR 76 | Wt 138.2 lb

## 2016-08-27 DIAGNOSIS — R0600 Dyspnea, unspecified: Secondary | ICD-10-CM | POA: Diagnosis not present

## 2016-08-27 DIAGNOSIS — I2510 Atherosclerotic heart disease of native coronary artery without angina pectoris: Secondary | ICD-10-CM

## 2016-08-27 DIAGNOSIS — R0609 Other forms of dyspnea: Secondary | ICD-10-CM | POA: Diagnosis not present

## 2016-08-27 DIAGNOSIS — R942 Abnormal results of pulmonary function studies: Secondary | ICD-10-CM | POA: Insufficient documentation

## 2016-08-27 DIAGNOSIS — M349 Systemic sclerosis, unspecified: Secondary | ICD-10-CM | POA: Insufficient documentation

## 2016-08-27 DIAGNOSIS — Z881 Allergy status to other antibiotic agents status: Secondary | ICD-10-CM | POA: Diagnosis not present

## 2016-08-27 DIAGNOSIS — Z8542 Personal history of malignant neoplasm of other parts of uterus: Secondary | ICD-10-CM | POA: Insufficient documentation

## 2016-08-27 DIAGNOSIS — Z7902 Long term (current) use of antithrombotics/antiplatelets: Secondary | ICD-10-CM | POA: Diagnosis not present

## 2016-08-27 DIAGNOSIS — Z955 Presence of coronary angioplasty implant and graft: Secondary | ICD-10-CM | POA: Diagnosis not present

## 2016-08-27 DIAGNOSIS — R0689 Other abnormalities of breathing: Secondary | ICD-10-CM | POA: Diagnosis not present

## 2016-08-27 DIAGNOSIS — Z79899 Other long term (current) drug therapy: Secondary | ICD-10-CM | POA: Diagnosis not present

## 2016-08-27 LAB — PULMONARY FUNCTION TEST
DL/VA % PRED: 70 %
DL/VA: 3.39 ml/min/mmHg/L
DLCO unc % pred: 63 %
DLCO unc: 15.42 ml/min/mmHg
FEF 25-75 POST: 2.89 L/s
FEF 25-75 Pre: 1.99 L/sec
FEF2575-%Change-Post: 45 %
FEF2575-%PRED-POST: 137 %
FEF2575-%Pred-Pre: 94 %
FEV1-%CHANGE-POST: 15 %
FEV1-%PRED-PRE: 88 %
FEV1-%Pred-Post: 101 %
FEV1-PRE: 2.11 L
FEV1-Post: 2.45 L
FEV1FVC-%Change-Post: 10 %
FEV1FVC-%Pred-Pre: 95 %
FEV6-%Change-Post: 9 %
FEV6-%Pred-Post: 98 %
FEV6-%Pred-Pre: 89 %
FEV6-POST: 2.96 L
FEV6-PRE: 2.69 L
FEV6FVC-%Change-Post: 0 %
FEV6FVC-%PRED-POST: 103 %
FEV6FVC-%PRED-PRE: 104 %
FVC-%Change-Post: 4 %
FVC-%PRED-POST: 94 %
FVC-%PRED-PRE: 91 %
FVC-POST: 2.98 L
FVC-PRE: 2.86 L
PRE FEV6/FVC RATIO: 100 %
Post FEV1/FVC ratio: 82 %
Post FEV6/FVC ratio: 99 %
Pre FEV1/FVC ratio: 74 %
RV % pred: 80 %
RV: 1.69 L
TLC % PRED: 91 %
TLC: 4.64 L

## 2016-08-27 LAB — ECHOCARDIOGRAM COMPLETE
CHL CUP MV DEC (S): 222
E/e' ratio: 7.32
EWDT: 222 ms
FS: 36 % (ref 28–44)
IV/PV OW: 1.18
LA ID, A-P, ES: 39 mm
LA diam index: 2.34 cm/m2
LA vol A4C: 48.6 ml
LA vol index: 33.1 mL/m2
LA vol: 55.2 mL
LDCA: 2.84 cm2
LEFT ATRIUM END SYS DIAM: 39 mm
LV PW d: 7.1 mm — AB (ref 0.6–1.1)
LV TDI E'LATERAL: 11.6
LV e' LATERAL: 11.6 cm/s
LVEEAVG: 7.32
LVEEMED: 7.32
LVOT VTI: 29.2 cm
LVOT peak grad rest: 9 mmHg
LVOTD: 19 mm
LVOTPV: 146 cm/s
LVOTSV: 83 mL
Lateral S' vel: 14.5 cm/s
MV Peak grad: 3 mmHg
MV pk A vel: 59.7 m/s
MV pk E vel: 84.9 m/s
TAPSE: 26.5 mm
TDI e' medial: 8.49

## 2016-08-27 MED ORDER — ALBUTEROL SULFATE (2.5 MG/3ML) 0.083% IN NEBU
2.5000 mg | INHALATION_SOLUTION | Freq: Once | RESPIRATORY_TRACT | Status: AC
  Administered 2016-08-27: 2.5 mg via RESPIRATORY_TRACT

## 2016-08-27 MED ORDER — CLOPIDOGREL BISULFATE 75 MG PO TABS: 75.0000 mg | ORAL_TABLET | Freq: Every day | ORAL | 2 refills | Status: DC

## 2016-08-27 NOTE — Progress Notes (Signed)
  Echocardiogram 2D Echocardiogram has been performed.  Rhonda Brewer 08/27/2016, 1:54 PM

## 2016-08-27 NOTE — Patient Instructions (Signed)
Stop Brillinta  Start Plavix 75 mg daily  High Resolution CT of chest  If shortness of breath gets worse please give [place] a call at [phone], opt 5  Your physician recommends that you schedule a follow-up appointment in: 1 month

## 2016-08-27 NOTE — Progress Notes (Signed)
PCP: Dr. Andria Frames Referring: Dr. Kirke Corin    HPI:  [name] is a 66 y/o woman with h/o scleroderma, CAD s/p stent 9/17 , SVT s/p ablation 2/05 referred by Dr. Patrecia Pour for screening for [facility] in setting of scleroderma.   Previously lived in [place]. 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 At time had exertional fatigue and dyspnea and arm tingling. No CP.    Cath 11/24/15 LM: mild irregs LAD: 20% mid LCX: mild irreg RCA: mRCA 99% ->Xience Alpine DES 3.5x45mm  ECHO 11/22/15: LVEF 55-60% Mild AI. Normal RV. Mild TR.   Non-smoker. Has had long h/o scleroderma (> 10 years). Very active. Walks a lot (2-3 miles per day). Also does some yoga and weights. No orthopnea, PND or edema. No coughing or presyncope/syncope.   Had walk test with Dr. Chase Caller this week with no desat.    Echo today: EF 6-065% RV normal Mild AI. No RV strain or PAH. Personally reviewed  PFTs:  FEV1 2.11 (88%) FVC 2.86 (91%) DLCO 63%   Review of Systems:     Cardiac Review of Systems: {Y] = yes [ ]  = no  Chest Pain [    ]  Resting SOB [   ] Exertional SOB  Blue.Reese  ]  Orthopnea [  ]   Pedal Edema [   ]    Palpitations [  ] Syncope  [  ]   Presyncope [   ]  General Review of Systems: [Y] = yes [  ]=no Constitional: recent weight change [  ]; anorexia [  ]; fatigue [  ]; nausea [  ]; night sweats [  ]; fever [  ]; or chills [  ];                                                                                                                                          Dental: poor dentition[  ]; y  Eye : blurred vision [  ]; diplopia [   ]; vision changes [  ];  Amaurosis fugax[  ]; Resp: cough [  ];  wheezing[  ];  hemoptysis[  ]; shortness of breath[  ]; paroxysmal nocturnal dyspnea[  ]; dyspnea on exertion[  ]; or orthopnea[  ];  GI:  gallstones[  ], vomiting[  ];  dysphagia[  ]; melena[  ];  hematochezia [  ];  heartburn[  ];   Hx of  Colonoscopy[  ]; GU: kidney stones [  ]; hematuria[  ];   dysuria [  ];  nocturia[  ];  history of     obstruction [  ];                 Skin: rash, swelling[ y ];, hair loss[  ];  peripheral edema[  ];  or itching[  ]; Musculosketetal: myalgias[  ];  joint swelling[  ];  joint erythema[  ];  joint pain[  ];  back pain[  ];  Heme/Lymph: bruising[  ];  bleeding[  ];  anemia[  ];  Neuro: TIA[  ];  headaches[  ];  stroke[  ];  vertigo[  ];  seizures[  ];   paresthesias[  ];  difficulty walking[  ];  Psych:depression[  ]; anxiety[  ];  Endocrine: diabetes[  ];  thyroid dysfunction[  ];  Other:    Past Medical History:  Diagnosis Date  . Cancer [facility])    Uterine 2008    Current Outpatient Prescriptions  Medication Sig Dispense Refill  . aspirin EC 81 MG tablet Take by mouth.    Marland Kitchen atorvastatin (LIPITOR) 40 MG tablet Take 40 mg by mouth daily.     . Estriol 10 % CREA Place 1 application vaginally 2 (two) times a week.     Marland Kitchen glucosamine-chondroitin 500-400 MG tablet Take 1 tablet by mouth daily.     . hydroxychloroquine (PLAQUENIL) 200 MG tablet Take 400 mg by mouth daily. Monday through Friday    . levothyroxine (SYNTHROID) 75 MCG tablet Take 75 mcg by mouth daily before breakfast.     . Multiple Vitamin (MULTI-VITAMINS) TABS Take 1 tablet by mouth daily.     . ranitidine (ZANTAC) 300 MG tablet Take 1 tablet (300 mg total) by mouth at bedtime. Replaces famotidine Rx. 90 tablet 3  . vitamin B-12 (CYANOCOBALAMIN) 100 MCG tablet Take 100 mcg by mouth daily.    . ticagrelor (BRILINTA) 90 MG TABS tablet 1 Q 12 H (Patient taking differently: Take 90 mg by mouth 2 (two) times daily. 1 Q 12 H) 180 tablet 3   No current facility-administered medications for this encounter.      Allergies  Allergen Reactions  . Avelox [Moxifloxacin Hcl In Nacl] Other (See Comments)    dizziness    Social History   Social History  . Marital status: Married    Spouse name: [name]  .  Number of children: [name]  . Years of education: [name]   Occupational History  . Not on file.   Social History Main Topics  . Smoking status: Never Smoker  . Smokeless tobacco: Never Used  . Alcohol use No  . Drug use: No  . Sexual activity: Not on file     Comment: hysterectomy   Other Topics Concern  . Not on file   Social History Narrative  . No narrative on file    Family History  Problem Relation Age of Onset  . Asthma Maternal Aunt     PHYSICAL EXAM: Vitals:   08/27/16 1359  BP: 130/72  Pulse: 76   General:  Well appearing. No respiratory difficulty HEENT: normal Neck: supple. no JVD. Carotids 2+ bilat; no bruits. No lymphadenopathy or thryomegaly appreciated. Cor: PMI nondisplaced. Regular rate & rhythm. No rubs, gallops or murmurs. Lungs: clear Abdomen: soft, nontender, nondistended. No hepatosplenomegaly. No bruits or masses. Good bowel sounds. Extremities: no cyanosis, clubbing, rash, edema. Mild skin tightening and telangectasias  Neuro: alert & oriented x 3, cranial nerves grossly intact. moves all 4 extremities w/o difficulty. Affect pleasant.   No results found for this or any previous visit (from the past 24 hour(s)). No results found.   ASSESSMENT & PLAN: 1. Dyspnea on exertion 2. CAD s/p RCA stent 9/17 3. Scleroderma 4. Abnormal DLCO on PFTs  She continues with exertional fatigue which appears stable since PCI. I have reviewed echo and PFTs personally. Echo normal without evidence of PAH or RV strain. PFTs with normal spirometry but mildly reduced DLCO.   Etiology of dyspnea currently unclear. Will switch Brilinta to Plavix and order hi-res CT of chest to look for CTD-related pulmonary fibrosis. If symptoms unchanged or worse will need R/L heart cath to assess patency of stent and evaluate for PAH.   Glori Bickers, MD  4:09 PM

## 2016-08-27 NOTE — Progress Notes (Signed)
Advanced Heart Failure Medication Review by a Pharmacist  Does the patient  feel that his/her medications are working for him/her?  yes  Has the patient been experiencing any side effects to the medications prescribed?  no  Does the patient measure his/her own blood pressure or blood glucose at home?  no   Does the patient have any problems obtaining medications due to transportation or finances?   No however Bilinta is ~$300/month and plaquenil ~$200. She is able to afford at this point.   Understanding of regimen: good Understanding of indications: good Potential of compliance: good Patient understands to avoid NSAIDs. Patient understands to avoid decongestants.  Issues to address at subsequent visits: copay assistance for Brilinta   Pharmacist comments: Rhonda Brewer is a pleasant 66 yo female presenting without Rx bottles, however with a good understanding of her medications. Patient has no complaints but does state that her brilinta is very costly, though she can afford it at this time.   [name], Pharm.D. PGY1 Pharmacy Resident 6/12/20182:12 PM Pager [phone]    Time with patient: 10 mins Preparation and documentation time: 3 mins Total time: 13 mins

## 2016-08-29 ENCOUNTER — Encounter: Payer: Self-pay | Admitting: Family Medicine

## 2016-08-29 NOTE — Progress Notes (Addendum)
Abstract of external records.  From GI Normal colonoscopy 02/05/13 will scan EGD 02/05/13 no Barretts or H pylori.  Will scan  DC summary for NSTMI 11/25/15  Will scan  Echocardiogram 11/22/15  Will scan.  No major abnormalities.  09/04/16, more records. 10/26/2014 received prevnar 13 pneumococcal vaccination. 09/14/13 received pneumovax 07/18/2011 received Tdap.  Per notes of 11/07/15 had dexa scan7/8/14 showed mild osteopenia of spine.  "Next due July 2018"   At this point she will need: 1. HIV and Hep C screen. 2. Mammo - likely ordered but I have no confirmed copy of results. 3. Dexa scan July 2018 4. Tetanus booster 2023 5. One last pneumovax 08/2018 since received previous dose before 65.

## 2016-09-03 ENCOUNTER — Ambulatory Visit (HOSPITAL_COMMUNITY)
Admission: RE | Admit: 2016-09-03 | Discharge: 2016-09-03 | Disposition: A | Discharge status: Home / Self Care | Payer: Medicare Other | Source: Ambulatory Visit | Attending: Internal Medicine | Admitting: Internal Medicine

## 2016-09-03 DIAGNOSIS — M47814 Spondylosis without myelopathy or radiculopathy, thoracic region: Secondary | ICD-10-CM | POA: Diagnosis not present

## 2016-09-03 DIAGNOSIS — I2510 Atherosclerotic heart disease of native coronary artery without angina pectoris: Secondary | ICD-10-CM | POA: Diagnosis not present

## 2016-09-03 DIAGNOSIS — I700 Atherosclerosis of aorta: Secondary | ICD-10-CM | POA: Diagnosis not present

## 2016-09-03 DIAGNOSIS — R0689 Other abnormalities of breathing: Secondary | ICD-10-CM

## 2016-09-03 DIAGNOSIS — R911 Solitary pulmonary nodule: Secondary | ICD-10-CM | POA: Insufficient documentation

## 2016-09-03 DIAGNOSIS — I313 Pericardial effusion (noninflammatory): Secondary | ICD-10-CM | POA: Insufficient documentation

## 2016-09-03 DIAGNOSIS — R0600 Dyspnea, unspecified: Secondary | ICD-10-CM | POA: Insufficient documentation

## 2016-09-04 ENCOUNTER — Ambulatory Visit (INDEPENDENT_AMBULATORY_CARE_PROVIDER_SITE_OTHER): Payer: Medicare Other | Admitting: Family Medicine

## 2016-09-04 ENCOUNTER — Encounter: Payer: Self-pay | Admitting: Family Medicine

## 2016-09-04 DIAGNOSIS — Z1159 Encounter for screening for other viral diseases: Secondary | ICD-10-CM

## 2016-09-04 DIAGNOSIS — Z114 Encounter for screening for human immunodeficiency virus [HIV]: Secondary | ICD-10-CM

## 2016-09-04 DIAGNOSIS — Z1239 Encounter for other screening for malignant neoplasm of breast: Secondary | ICD-10-CM | POA: Insufficient documentation

## 2016-09-04 DIAGNOSIS — Z1231 Encounter for screening mammogram for malignant neoplasm of breast: Secondary | ICD-10-CM

## 2016-09-04 DIAGNOSIS — I2510 Atherosclerotic heart disease of native coronary artery without angina pectoris: Secondary | ICD-10-CM | POA: Diagnosis not present

## 2016-09-04 DIAGNOSIS — M7052 Other bursitis of knee, left knee: Secondary | ICD-10-CM | POA: Diagnosis not present

## 2016-09-04 DIAGNOSIS — M8589 Other specified disorders of bone density and structure, multiple sites: Secondary | ICD-10-CM

## 2016-09-04 NOTE — Patient Instructions (Addendum)
At this point she will need: 1. HIV and Hep C screen. 2. Mammo - likely ordered but I have no confirmed copy of results. 3. Dexa scan July 2018 4. Tetanus booster 2023 5. One last pneumovax 08/2018 since received previous dose before 65.    Someone should contact you.  I ordered the bone density test, which can be done any time.    I will put in an order for your screening mammogram.  I would wait until at least the end of Sept.  Medicare may not pay if it is done in less than one year.  Google Pes anserinus bursitis to learn more about your knee.  I hope the cortisone shot helps.  Ice today to prevent bruising.

## 2016-09-05 LAB — HEPATITIS C ANTIBODY: Hep C Virus Ab: <0.1 s/co ratio (ref 0.0–0.9)

## 2016-09-05 LAB — HIV ANTIBODY (ROUTINE TESTING W REFLEX): HIV SCREEN 4TH GENERATION: NONREACTIVE

## 2016-09-05 NOTE — Assessment & Plan Note (Signed)
Retest now.

## 2016-09-05 NOTE — Assessment & Plan Note (Signed)
Done and neg

## 2016-09-05 NOTE — Assessment & Plan Note (Signed)
Done and negative

## 2016-09-05 NOTE — Progress Notes (Signed)
   Subjective:    Patient ID: Rhonda Brewer, female    DOB: 1950/08/16, 66 y.o.   MRN: [id number]  HPI Left knee pain.  Mild.  Present since they moved.  Moving required lots of lifting and stooping.  Not improving.  Present x 2 months.  No trauma.  No previous arthritis.  Interfering with activities - Yoga  Outside records.  Please see documentation Needs hep c screen and hiv screen.   Osteopenia needs repeat dexa Will be due for mammo in fall.    Review of Systems     Objective:   Physical Exam Left knee.  No effusion.  Pain is not max over joint.  Marked over inferior medial knee at pes ansuranus.    After discussion, bursal injection at pes with 40 mg of depo medrol and 2 cc xylocaine with good immediate relief.        Assessment & Plan:

## 2016-09-05 NOTE — Assessment & Plan Note (Signed)
Ordered screen for fall

## 2016-09-05 NOTE — Assessment & Plan Note (Signed)
Injection

## 2016-09-06 ENCOUNTER — Ambulatory Visit
Admission: RE | Admit: 2016-09-06 | Discharge: 2016-09-06 | Disposition: A | Discharge status: Home / Self Care | Payer: Medicare Other | Source: Ambulatory Visit | Attending: Family Medicine | Admitting: Family Medicine

## 2016-09-06 DIAGNOSIS — M85852 Other specified disorders of bone density and structure, left thigh: Secondary | ICD-10-CM | POA: Diagnosis not present

## 2016-09-06 DIAGNOSIS — M8589 Other specified disorders of bone density and structure, multiple sites: Secondary | ICD-10-CM

## 2016-09-06 DIAGNOSIS — Z780 Asymptomatic menopausal state: Secondary | ICD-10-CM | POA: Diagnosis not present

## 2016-09-19 ENCOUNTER — Encounter: Payer: Self-pay | Admitting: Family Medicine

## 2016-09-20 ENCOUNTER — Encounter: Payer: Self-pay | Admitting: Family Medicine

## 2016-09-23 ENCOUNTER — Other Ambulatory Visit: Payer: Self-pay | Admitting: *Deleted

## 2016-09-23 MED ORDER — RANITIDINE HCL 300 MG PO TABS: 300.0000 mg | ORAL_TABLET | Freq: Every day | ORAL | 3 refills | Status: DC

## 2016-09-26 ENCOUNTER — Encounter (HOSPITAL_COMMUNITY): Payer: Self-pay | Admitting: Internal Medicine

## 2016-09-26 ENCOUNTER — Ambulatory Visit (HOSPITAL_COMMUNITY)
Admission: RE | Admit: 2016-09-26 | Discharge: 2016-09-26 | Disposition: A | Discharge status: Home / Self Care | Payer: Medicare Other | Source: Ambulatory Visit | Attending: Internal Medicine | Admitting: Internal Medicine

## 2016-09-26 VITALS — BP 114/62 | HR 56 | Wt 135.1 lb

## 2016-09-26 DIAGNOSIS — Z7902 Long term (current) use of antithrombotics/antiplatelets: Secondary | ICD-10-CM | POA: Diagnosis not present

## 2016-09-26 DIAGNOSIS — R0600 Dyspnea, unspecified: Secondary | ICD-10-CM | POA: Insufficient documentation

## 2016-09-26 DIAGNOSIS — Z9889 Other specified postprocedural states: Secondary | ICD-10-CM | POA: Diagnosis not present

## 2016-09-26 DIAGNOSIS — I2510 Atherosclerotic heart disease of native coronary artery without angina pectoris: Secondary | ICD-10-CM

## 2016-09-26 DIAGNOSIS — R942 Abnormal results of pulmonary function studies: Secondary | ICD-10-CM

## 2016-09-26 DIAGNOSIS — Z7982 Long term (current) use of aspirin: Secondary | ICD-10-CM | POA: Insufficient documentation

## 2016-09-26 DIAGNOSIS — I471 Supraventricular tachycardia: Secondary | ICD-10-CM | POA: Diagnosis not present

## 2016-09-26 DIAGNOSIS — M349 Systemic sclerosis, unspecified: Secondary | ICD-10-CM

## 2016-09-26 DIAGNOSIS — Z955 Presence of coronary angioplasty implant and graft: Secondary | ICD-10-CM | POA: Diagnosis not present

## 2016-09-26 HISTORY — DX: Atherosclerotic heart disease of native coronary artery without angina pectoris: I25.10

## 2016-09-26 NOTE — Patient Instructions (Signed)
OK to STOP Plavix in September.  Follow up in 4 months, we will contact you to schedule appointment.

## 2016-09-26 NOTE — Progress Notes (Signed)
PCP: Dr. Andria Frames Referring: Dr. Kirke Corin    HPI:  Rhonda Brewer is a 66 y/o woman with h/o scleroderma, CAD s/p stent 9/17 , SVT s/p ablation 2/05 referred by Dr. Patrecia Pour for screening for [facility] in setting of scleroderma.   Previously lived in [place]. 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 At time had exertional fatigue and dyspnea and arm tingling. No CP.    Cath 11/24/15 LM: mild irregs LAD: 20% mid LCX: mild irreg RCA: mRCA 99% ->Xience Alpine DES 3.5x32mm  Echo 11/22/15: LVEF 55-60% Mild AI. Normal RV. Mild TR.  Echo 08/27/16: EF 60-65%,  RV normal Mild AI. No RV strain or PAH.   PFTs 08/2016 FEV1 2.11 (88%) FVC 2.86 (91%) DLCO 63%  She was seen in the HF clinic for initial evaluation in June 2018. It was felt that her dyspnea could be due to Brilinta. She was switched to Plavix. Chest CT was ordered and showed no interstitial lung disease.   She returns today for follow up. Feeling much better since stopping Brilinta. She can now walk up stairs without dyspnea. She walks 3-4 miles a day, does yoga without SOB. Denies chest pain, palpitations. She has been taking all of her medications, eating a healthy diet.    Past Medical History:  Diagnosis Date  . CAD (coronary artery disease)    a. DES to RCA 11/2015  . Cancer [facility])    Uterine 2008    Current Outpatient Prescriptions  Medication Sig Dispense Refill  . aspirin EC 81 MG tablet Take by mouth.    Marland Kitchen atorvastatin (LIPITOR) 40 MG tablet Take 40 mg by mouth daily.     . clopidogrel (PLAVIX) 75 MG tablet Take 1 tablet (75 mg total) by mouth daily. 90 tablet 2  . Estriol 10 % CREA Place 1 application vaginally 2 (two) times a week.     Marland Kitchen glucosamine-chondroitin 500-400 MG tablet Take 1 tablet by mouth daily.     . hydroxychloroquine (PLAQUENIL) 200 MG tablet Take 400 mg by mouth daily. Monday through Friday    .  levothyroxine (SYNTHROID) 75 MCG tablet Take 75 mcg by mouth daily before breakfast.     . Multiple Vitamin (MULTI-VITAMINS) TABS Take 1 tablet by mouth daily.     . ranitidine (ZANTAC) 300 MG tablet Take 1 tablet (300 mg total) by mouth at bedtime. 90 tablet 3  . vitamin B-12 (CYANOCOBALAMIN) 100 MCG tablet Take 100 mcg by mouth daily.     No current facility-administered medications for this encounter.      Allergies  Allergen Reactions  . Avelox [Moxifloxacin Hcl In Nacl] Other (See Comments)    dizziness    Social History   Social History  . Marital status: Married    Spouse name: [name]  . Number of children: [name]  . Years of education: [name]   Occupational History  . Not on file.   Social History Main Topics  . Smoking status: Never Smoker  . Smokeless tobacco: Never Used  . Alcohol use No  . Drug use: No  . Sexual activity: Not on file     Comment: hysterectomy   Other Topics Concern  . Not on file   Social History Narrative  . No narrative on file    Family History  Problem Relation Age of Onset  . Asthma Maternal Aunt  PHYSICAL EXAM: Vitals:   09/26/16 1323  BP: 114/62  Pulse: (!) 56    General: Well appearing. No resp difficulty. HEENT: Normal Neck: Supple. JVP 5-6. Carotids 2+ bilat; no bruits. No thyromegaly or nodule noted. Cor: PMI nondisplaced. RRR, No M/G/R noted Lungs: CTAB, normal effort. Abdomen: Soft, non-tender, non-distended, no HSM. No bruits or masses. +BS  Extremities: No cyanosis, clubbing, rash, R and LLE no edema.  Neuro: Alert & orientedx3, cranial nerves grossly intact. moves all 4 extremities w/o difficulty. Affect pleasant   ASSESSMENT & PLAN:  1. Dyspnea on exertion: Now resolved with switch from Brilinta to Plavix.   2. CAD s/p RCA stent in 11/2015: - Continue Plavix and ASA for a year. Can stop Plavix in 11/2016.  - Continue atorvastatin 40 mg - HR too low for beta blocker.  - Repeat Echo in June 2019.   3.  Scleroderma - Repeat PFT's in June 2019.   Follow up in 4 months.    Arbutus Leas, NP  1:28 PM   Patient seen and examined with Jettie Booze, NP. We discussed all aspects of the encounter. I agree with the assessment and plan as stated above.   Doing well. Dyspnea much improved with switch from Brillinta to Plavix. No s/s ischemia. Can stop Plavix in September.  DLCO mildly reduced. No R heart strain on echo. CT without ILD. Will repeat PFTs and echo next year. If DLCO decreasing further will need RHC.   Glori Bickers, MD  2:37 PM

## 2016-10-31 ENCOUNTER — Encounter: Payer: Self-pay | Admitting: Family Medicine

## 2016-10-31 ENCOUNTER — Encounter: Payer: Self-pay | Admitting: Gastroenterology

## 2016-10-31 ENCOUNTER — Ambulatory Visit (INDEPENDENT_AMBULATORY_CARE_PROVIDER_SITE_OTHER): Payer: Medicare Other | Admitting: Family Medicine

## 2016-10-31 DIAGNOSIS — R1012 Left upper quadrant pain: Secondary | ICD-10-CM | POA: Diagnosis not present

## 2016-10-31 DIAGNOSIS — I2510 Atherosclerotic heart disease of native coronary artery without angina pectoris: Secondary | ICD-10-CM

## 2016-10-31 MED ORDER — PANTOPRAZOLE SODIUM 40 MG PO TBEC: 40.0000 mg | DELAYED_RELEASE_TABLET | Freq: Every day | ORAL | 1 refills | Status: DC

## 2016-10-31 NOTE — Patient Instructions (Signed)
Someone should call in the next couple of days about the GI appointment. Stop the ranitidine for now. I sent in a prescription for the more potent acid reducers.  Take the protonix until seen by GI.

## 2016-11-01 NOTE — Progress Notes (Signed)
   Subjective:    Patient ID: Rhonda Brewer, female    DOB: 05/16/1950, 66 y.o.   MRN: [id number]  HPI FU epigastric and left upper quadrent pain.  Had initially resolved with ranitidine.  Now pain is back.  She can find not pattern to the pain.  Specifically denies association with meals, activity or bowel movements.  No vomiting.  BMs normal without bleeding.  Does seem to be improved when lying down.  Although she presents this as an acute problem, reviewing records, seems to be a chronic recurrent problem WU: per my synopsis of outside records: EGD 02/05/13 no Barretts or H pylori.  It is also scanned under media, 02/05/13.  Also, she had a CT chest on 09/03/16: with the comment that upper abd appeared normal.  CBC 06/19/16 normal  She has a strongly positive family history for stomach cancer.  She had a brother and uncle die of stomach cancer.         Review of Systems     Objective:   Physical Exam  Lungs clear Cardiac RRR without m or g Abd benign, no masses        Assessment & Plan:

## 2016-11-01 NOTE — Assessment & Plan Note (Signed)
I am most concern about unexplained pain in patient with strong family hx of stomach cancer.  Will switch from H2 blocker to PPI.  Also GI referral.  Note she will [place] out of the country in a month for a month.  Would like WU done prior to departure.

## 2016-11-05 ENCOUNTER — Encounter: Payer: Self-pay | Admitting: Internal Medicine

## 2016-11-05 ENCOUNTER — Ambulatory Visit (INDEPENDENT_AMBULATORY_CARE_PROVIDER_SITE_OTHER): Payer: Medicare Other | Admitting: Internal Medicine

## 2016-11-05 VITALS — BP 122/70 | HR 67 | Ht 64.0 in | Wt 134.0 lb

## 2016-11-05 DIAGNOSIS — R0689 Other abnormalities of breathing: Secondary | ICD-10-CM

## 2016-11-05 DIAGNOSIS — R0600 Dyspnea, unspecified: Secondary | ICD-10-CM | POA: Diagnosis not present

## 2016-11-05 DIAGNOSIS — I2510 Atherosclerotic heart disease of native coronary artery without angina pectoris: Secondary | ICD-10-CM | POA: Diagnosis not present

## 2016-11-05 NOTE — Progress Notes (Signed)
Subjective:     Patient ID: Rhonda Brewer, female   DOB: 08/30/1950, 66 y.o.   MRN: [id number]  HPI    PCP [name], MD   HPI  IOV 08/16/2016  Chief Complaint  Patient presents with  . Advice Only    Referred by Dr. Estanislado Pandy for scleroderma.  c/o worsening sob, chest tightness with exertion X1 month.    HPI 66 year old woman with history of scleroderma on hydroxychloroquine, CAD s/p stent 11/2015, uterine cancer dx in 2008 s/p hysterectomy, chemo/radtx presenting for evaluation of dyspnea on exertion.  She is followed by Dr. Patrecia Pour for rheumatology, Dr. Haroldine Laws for cardiology. Previously was followed by Dr. Gwynneth Aliment in [place]. Last saw Dr. Estanislado Pandy 06/19/2016. She has had scleroderma for 10 years and only has been on hydroxychloroquine. She moved to [facility] in January 2018.  For the past month, she has dyspnea with going up one flight of stairs most times. This is new. Also sometimes gets short of breath with bending over to pick something up. Denies cough. She does yoga, strength training, and walking for exercise. She can walk a couple of miles without any issues. She is independent in all her activities of daily living. Does not use any devices to aid in walking. Denies GERD. Has not had a pulmonologist.  She had shortness of breath that prompted the cardiac evaluation leading to stent placement. She has an appointment with cardiology June 12. Review of Systems  S: See resident physician for details. She has scleroderma for over 10 years for which she is on Actonel. Denies any associated acid reflux. Started on was believed only to involve the skin. I personally evaluated the history that this patient had a few months of shortness of breath in 2017 between summer and fall that then resulted in worsening dyspnea on exertion. That then resulted in a cardiac stent. After this dyspnea resolved. Then subsequently in January 2018 moved from the [place] area to [place], [place]. Now for the last 1 month she's having recurrent dyspnea on exertion. She feels this is from the heart. She does not think is a lung issue. Relieved by rest. She notices it for climbing stairs relieved by rest. She has cardiology appointment pending. There is a pulmonary function test and echocardiogram pending on 08/27/2016. She is reluctant to get a CT chest. Walking desat test in office 185 feet x  3 laps on RA: 100% at rest and exertion  Recent pertinent labs  Results for Rhonda, Brewer (MRN [id number]) as of 08/16/2016 10:29  Ref. Range 06/19/2016 10:38 06/26/2016 10:04  Creatinine Latest Ref Range: 0.50 - 0.99 mg/dL 0.79   Results for Rhonda, Brewer (MRN [id number]) as of 08/16/2016 10:29  Ref. Range 06/19/2016 10:38 06/26/2016 10:04  Hemoglobin Latest Ref Range: 11.7 - 15.5 g/dL 14.8      has a past medical history of Cancer ([place]).   reports that she has never smoked. She has never used smokeless tobacco.   OV 11/05/2016  Chief Complaint  Patient presents with  . Follow-up    Pt here after CT and PFT. Pt denies change in SOB since last OV. Pt denies cough, CP/tightness, f/c/s.     Follow-up scleroderma with associated shortness of breath   last seen in June 2018. At that time he thought the suspicion for interstitial lung disease was low. She had Rosanne Gutting function test that showed isolated reduction in diffusion capacity to 63%. This raises the possibility of interstitial lung disease but she  did have a high-resolution CT scan of the chest that is documented below. Interstitial lung disease has been ruled out. This no pulmonary parenchymal abnormality. Her hemoglobin was 14.8 g percent suggesting no anemia causing shortness of breath. She then followed up with cardiology. According to the echocardiogram she does not have pulmonary hypertension which can be seen and started on the patient's. She was on  BRILINTA for CAD - this got changed to Plavix and her dyspnea resolved. Currently she is  doing well does not have any rest symptoms.   Results for Rhonda, Brewer (MRN [id number]) as of 11/05/2016 11:24  Ref. Range 08/27/2016 11:19  FVC-Pre Latest Units: L 2.86  FVC-%Pred-Pre Latest Units: % 91  FEV1-Pre Latest Units: L 2.11  FEV1-%Pred-Pre Latest Units: % 88  Pre FEV1/FVC ratio Latest Units: % 74    Results for Rhonda, Brewer (MRN [id number]) as of 11/05/2016 11:24  Ref. Range 08/27/2016 11:19  TLC Latest Units: L 4.64  TLC % pred Latest Units: % 91  Results for Rhonda, Brewer (MRN [id number]) as of 11/05/2016 11:24  Ref. Range 08/27/2016 11:19  DLCO unc Latest Units: ml/min/mmHg 15.42  DLCO unc % pred Latest Units: % 63   IMPRESSION: 1. No evidence of interstitial lung disease. No acute pulmonary disease . 2. Solitary 3 mm solid apical right upper lobe pulmonary nodule. No follow-up needed if patient is low-risk. Non-contrast chest CT can be considered in 12 months if patient is high-risk. This recommendation follows the consensus statement: Guidelines for Management of Incidental Pulmonary Nodules Detected on CT Images: From the Fleischner Society 2017; Radiology 2017; 284:228-243. 3. Small pericardial effusion/thickening. 4. Three-vessel coronary atherosclerosis.  Aortic Atherosclerosis (ICD10-I70.0).   Electronically Signed   By: Ilona Sorrel M.D.   On: 09/03/2016 15:18    has a past medical history of CAD (coronary artery disease) and Cancer ([place]).   reports that she has never smoked. She has never used smokeless tobacco.  Past Surgical History:  Procedure Laterality Date  . ABDOMINAL HYSTERECTOMY    . APPENDECTOMY    . CARDIAC ELECTROPHYSIOLOGY MAPPING AND ABLATION  04/19/2003  . CORONARY ANGIOPLASTY WITH STENT PLACEMENT      Allergies  Allergen Reactions  . Avelox [Moxifloxacin Hcl In Nacl] Other (See Comments)    dizziness    Immunization History  Administered Date(s) Administered  . Influenza Split 01/17/2016  . Pneumococcal Conjugate-13  10/26/2014  . Pneumococcal-Unspecified 09/14/2013  . Td 07/18/2011    Family History  Problem Relation Age of Onset  . Asthma Maternal Aunt      Current Outpatient Prescriptions:  .  aspirin EC 81 MG tablet, Take by mouth., Disp: , Rfl:  .  atorvastatin (LIPITOR) 40 MG tablet, Take 40 mg by mouth daily. , Disp: , Rfl:  .  clopidogrel (PLAVIX) 75 MG tablet, Take 1 tablet (75 mg total) by mouth daily., Disp: 90 tablet, Rfl: 2 .  Estriol 10 % CREA, Place 1 application vaginally 2 (two) times a week. , Disp: , Rfl:  .  glucosamine-chondroitin 500-400 MG tablet, Take 1 tablet by mouth daily. , Disp: , Rfl:  .  hydroxychloroquine (PLAQUENIL) 200 MG tablet, Take 400 mg by mouth daily. Monday through Friday, Disp: , Rfl:  .  levothyroxine (SYNTHROID) 75 MCG tablet, Take 75 mcg by mouth daily before breakfast. , Disp: , Rfl:  .  Multiple Vitamin (MULTI-VITAMINS) TABS, Take 1 tablet by mouth daily. , Disp: , Rfl:  .  pantoprazole (PROTONIX) 40 MG tablet, Take  1 tablet (40 mg total) by mouth daily., Disp: 60 tablet, Rfl: 1 .  vitamin B-12 (CYANOCOBALAMIN) 100 MCG tablet, Take 100 mcg by mouth daily., Disp: , Rfl:    Review of Systems     Objective:   Physical Exam Vitals:   11/05/16 1122  BP: 122/70  Pulse: 67  SpO2: 97%    Estimated body mass index is 23 kg/m as calculated from the following:   Height as of 10/31/16: 5\' 4"  (1.626 m).   Weight as of 10/31/16: 134 lb (60.8 kg). Brief physical exam shows absence of crackles and normal heart sounds    Assessment:       ICD-10-CM   1. Dyspnea and respiratory abnormalities R06.00 Pulmonary Function Test   R06.89        Plan:     Though she is a candidate for interstitial lung disease and pulmonary hypertension based on low DLCO and scleroderma history he did these look unlikely based on high resolution CT scan of the chest that was normal and echocardiogram that does not show evidence of pulmonary hypertension. Her dyspnea has  resolved after stopping brilinta; this can be cause of dyspnea in a high percentage of patients taking this drug. At this point because of the low isolated reduction in diffusion capacity be related to see her in 1 year with pulmonary function test. She is agreeable with this plan if anything changes she will come earlier.   Dr. Brand Males, M.D., [facility].C.P Pulmonary and Critical Care Medicine Staff Physician [facility] Pager: [phone], If no answer or between  15:00h - 7:00h: call [phone]  11/05/2016 11:45 AM

## 2016-11-05 NOTE — Patient Instructions (Addendum)
ICD-10-CM   1. Dyspnea and respiratory abnormalities R06.00    R06.89    No evidence of ILD disease in lung on CT chest though PFT test suggested that you might have it Glad shortness of breath resolved after switch from brilinta to plavix  followup  - in 1 year do Pre-bd spiro and dlco only. No lung volume or bd response. No post-bd spiro - return to see me after that  - return sooner if needed

## 2016-11-13 ENCOUNTER — Other Ambulatory Visit: Payer: Self-pay | Admitting: *Deleted

## 2016-11-13 DIAGNOSIS — R1012 Left upper quadrant pain: Secondary | ICD-10-CM

## 2016-11-13 MED ORDER — PANTOPRAZOLE SODIUM 40 MG PO TBEC: 40.0000 mg | DELAYED_RELEASE_TABLET | Freq: Every day | ORAL | 1 refills | Status: DC

## 2016-11-19 ENCOUNTER — Other Ambulatory Visit (HOSPITAL_COMMUNITY): Payer: Self-pay | Admitting: Cardiology

## 2016-11-19 MED ORDER — CLOPIDOGREL BISULFATE 75 MG PO TABS: 75.0000 mg | ORAL_TABLET | Freq: Every day | ORAL | 0 refills | Status: DC

## 2016-11-19 NOTE — Telephone Encounter (Signed)
Patient was advised to continue PLavix until 9/30, with patients current bottle she has enough until 9/17, requests a 14 day supply to local pharmacy, if she refills with mail order she will have #90  As requested RX sent to local [facility]

## 2016-11-29 DIAGNOSIS — L821 Other seborrheic keratosis: Secondary | ICD-10-CM | POA: Diagnosis not present

## 2016-11-29 DIAGNOSIS — D2371 Other benign neoplasm of skin of right lower limb, including hip: Secondary | ICD-10-CM | POA: Diagnosis not present

## 2016-11-29 DIAGNOSIS — L814 Other melanin hyperpigmentation: Secondary | ICD-10-CM | POA: Diagnosis not present

## 2016-11-29 DIAGNOSIS — L819 Disorder of pigmentation, unspecified: Secondary | ICD-10-CM | POA: Diagnosis not present

## 2016-11-29 DIAGNOSIS — D225 Melanocytic nevi of trunk: Secondary | ICD-10-CM | POA: Diagnosis not present

## 2016-11-29 DIAGNOSIS — D224 Melanocytic nevi of scalp and neck: Secondary | ICD-10-CM | POA: Diagnosis not present

## 2016-12-17 ENCOUNTER — Other Ambulatory Visit: Payer: Self-pay | Admitting: Family Medicine

## 2016-12-17 DIAGNOSIS — Z1231 Encounter for screening mammogram for malignant neoplasm of breast: Secondary | ICD-10-CM

## 2016-12-19 ENCOUNTER — Ambulatory Visit: Payer: Medicare Other | Admitting: Rheumatology

## 2016-12-25 NOTE — Progress Notes (Signed)
Office Visit Note  Patient: Rhonda Brewer             Date of Birth: 07/19/1950           MRN: [id number]             PCP: [name], MD Referring: [name], MD Visit Date: 01/06/2017 Occupation: @GUAROCC @    Subjective:  Raynauds and hand stiffness.   History of Present Illness: Rhonda Brewer is a 66 y.o. female  with scleroderma patient returns today after her initial visit in April 2018. She states she continues to have some stiffness in her hands and some issues with Raynauds phenomenon. But no recent episodes of joint swelling. She denies any increased skin tightness or rash. She has seen Dr. Chase Caller who will follow her closely. She's also seen cardiologist. She had recent endoscopy which was unremarkable per patient.   Activities of Daily Living:  Patient reports morning stiffness for 10 minutes.   Patient Denies nocturnal pain.  Difficulty dressing/grooming: Denies Difficulty climbing stairs: Denies Difficulty getting out of chair: Denies Difficulty using hands for taps, buttons, cutlery, and/or writing: Denies   Review of Systems  Constitutional: Negative.  Negative for fatigue, night sweats, weight gain, weight loss and weakness.  HENT: Negative.  Negative for mouth sores, trouble swallowing, trouble swallowing, mouth dryness and nose dryness.   Eyes: Positive for dryness. Negative for pain, redness and visual disturbance.  Respiratory: Negative.  Negative for cough, shortness of breath and difficulty breathing.   Cardiovascular: Negative.  Negative for chest pain, palpitations, hypertension, irregular heartbeat and swelling in legs/feet.  Gastrointestinal: Negative.  Negative for blood in stool, constipation and diarrhea.  Endocrine: Negative for increased urination.  Genitourinary: Negative for vaginal dryness.  Musculoskeletal: Positive for arthralgias, joint pain and morning stiffness. Negative for joint swelling, myalgias, muscle weakness, muscle  tenderness and myalgias.  Skin: Positive for color change and skin tightness. Negative for rash, hair loss, ulcers and sensitivity to sunlight.  Allergic/Immunologic: Negative for susceptible to infections.  Neurological: Negative.  Negative for dizziness, numbness, headaches, memory loss and night sweats.  Hematological: Negative for swollen glands.  Psychiatric/Behavioral: Negative.  Negative for depressed mood and sleep disturbance. The patient is not nervous/anxious.     PMFS History:  Patient Active Problem List   Diagnosis Date Noted  . Screening for breast cancer 09/04/2016  . Pes anserinus bursitis of left knee 09/04/2016  . Dyspnea and respiratory abnormalities 08/16/2016  . Rash and nonspecific skin eruption 07/25/2016  . LUQ abdominal pain 06/26/2016  . H/O cardiac radiofrequency ablation 06/26/2016  . CAD (coronary artery disease), native coronary artery 06/19/2016  . History of hypothyroidism 06/19/2016  . History of uterine cancer 06/19/2016  . Scleroderma ([place]) 06/16/2016  . High risk medication use 06/16/2016  . Osteopenia of multiple sites 06/16/2016    Past Medical History:  Diagnosis Date  . Allergy   . Arthritis   . CAD (coronary artery disease)    a. DES to RCA 11/2015  . Cancer [facility])    Uterine 2008  . Heart murmur   . Hyperlipidemia   . Osteoporosis   . Thyroid disease     Family History  Problem Relation Age of Onset  . Asthma Maternal Aunt   . Heart disease Mother   . Diabetes Mother   . Heart disease Father   . Stomach cancer Brother   . Colon cancer Neg Hx   . Esophageal cancer Neg Hx   .  Rectal cancer Neg Hx    Past Surgical History:  Procedure Laterality Date  . ABDOMINAL HYSTERECTOMY    . APPENDECTOMY    . CARDIAC ELECTROPHYSIOLOGY MAPPING AND ABLATION  04/19/2003  . CORONARY ANGIOPLASTY WITH STENT PLACEMENT     Social History   Social History Narrative  . No narrative on file     Objective: Vital Signs: BP 120/67 (BP  Location: Left Arm, Patient Position: Sitting, Cuff Size: Normal)   Pulse 74   Ht 5\' 4"  (1.626 m)   Wt 140 lb (63.5 kg)   BMI 24.03 kg/m    Physical Exam  Constitutional: She is oriented to person, place, and time. She appears well-developed and well-nourished.  HENT:  Head: Normocephalic and atraumatic.  Eyes: Conjunctivae and EOM are normal.  Neck: Normal range of motion.  Cardiovascular: Normal rate, regular rhythm, normal heart sounds and intact distal pulses.   Pulmonary/Chest: Effort normal and breath sounds normal.  Abdominal: Soft. Bowel sounds are normal.  Lymphadenopathy:    She has no cervical adenopathy.  Neurological: She is alert and oriented to person, place, and time.  Skin: Skin is warm and dry. Capillary refill takes less than 2 seconds.  sclerodactyly noted on bilateral hands. Hyperpigmentation noted on bilateral hands. Nail bed capillary changes noted.  Psychiatric: She has a normal mood and affect. Her behavior is normal.  Nursing note and vitals reviewed.    Musculoskeletal Exam: C-spine and thoracic lumbar spine good range of motion. Shoulder joints elbow joints wrist joint MCPs PIPs DIPs with good range of motion with no synovitis. She is some tightness with taking fist formation. Hip joints knee joints ankles MTPs PIPs with good range of motion with no synovitis.  CDAI Exam: No CDAI exam completed.    Investigation: No additional findings.PLQ eye exam? CBC Latest Ref Rng & Units 06/19/2016  WBC 3.8 - 10.8 K/uL 5.0  Hemoglobin 11.7 - 15.5 g/dL 14.8  Hematocrit 35.0 - 45.0 % 44.9  Platelets 140 - 400 K/uL 88(L)   CMP Latest Ref Rng & Units 06/19/2016  Glucose 65 - 99 mg/dL 82  BUN 7 - 25 mg/dL 14  Creatinine 0.50 - 0.99 mg/dL 0.79  Sodium 135 - 146 mmol/L 139  Potassium 3.5 - 5.3 mmol/L 4.2  Chloride 98 - 110 mmol/L 104  CO2 20 - 31 mmol/L 24  Calcium 8.6 - 10.4 mg/dL 9.5  Total Protein 6.1 - 8.1 g/dL 6.8  Total Bilirubin 0.2 - 1.2 mg/dL 0.7    Alkaline Phos 33 - 130 U/L 64  AST 10 - 35 U/L 27  ALT 6 - 29 U/L 24    UA negative, HIV negative,hepatitis C negative Imaging: No results found.  Speciality Comments: No specialty comments available.    Procedures:  No procedures performed Allergies: Avelox [moxifloxacin hcl in nacl] and Other   Assessment / Plan:     Visit Diagnoses: Scleroderma ([place]) - Limited systemic with Raynauds, Telengectesia's, sclerodactyly, arthralgias, erosions in right fifth and left third DIP, ANA centromere  - patient continues to do well. She states her joint pain and stiffness is controlled on Plaquenil which she's been taking for the last 10 years from her rheumatologist in [place]. She states she had recent eye exam with Dr. Katy Fitch which was within normal limits. I do not have that report available. She's also seen Dr. Chase Caller and will be following up with them on a yearly basis. She's been closely monitored by her cardiologist. She had recent GI workup  which was unremarkable per patient.  High risk medication use - Hydroxychloroquine 200 mg twice a day Monday through Friday. She is clinically doing well. I've discussed about reducing her Plaquenil to once a day on daily basis if she has increased joint pain and discomfort then she can go back to twice a day dosing. Eye exam per patient was in May 2018. She was advised to get eye exam every 6 months.- Plan: CBC with Differential/Platelet, COMPLETE METABOLIC PANEL WITH GFR, CBC with Differential/Platelet, COMPLETE METABOLIC PANEL WITH GFR  Raynaud's phenomenon: Stable protective clothing was discussed as a winter is approaching.  Thrombocytopenia [facility]): Her last labs showed trauma cytopenia. Patient believes is because of anticoagulant use. She has come off the medication now. We will check her labs again today and then every 3 months to monitor. If the labs are stable we can change them to every 5 months.  Osteopenia of multiple sites - DEXA 6/2018t  score -1.6 left femur. She is on calcium and vitamin D.  History of coronary artery disease - status post stent  History of hypothyroidism  History of uterine cancer - 2008, status post hysterectomy, chemotherapy, radiation therapy     Orders: Orders Placed This Encounter  Procedures  . CBC with Differential/Platelet  . COMPLETE METABOLIC PANEL WITH GFR   No orders of the defined types were placed in this encounter.   Face-to-face time spent with patient was 30 minutes. Greater than 50% of time was spent in counseling and coordination of care.  Follow-Up Instructions: Return in about 6 months (around 07/07/2017) for Scleroderma, osteopenia.   Bo Merino, MD  Note - This record has been created using Editor, commissioning.  Chart creation errors have been sought, but may not always  have been located. Such creation errors do not reflect on  the standard of medical care.

## 2017-01-02 ENCOUNTER — Encounter: Payer: Self-pay | Admitting: Gastroenterology

## 2017-01-02 ENCOUNTER — Ambulatory Visit (INDEPENDENT_AMBULATORY_CARE_PROVIDER_SITE_OTHER): Payer: Medicare Other | Admitting: Gastroenterology

## 2017-01-02 VITALS — BP 110/70 | HR 68 | Ht 64.0 in | Wt 139.0 lb

## 2017-01-02 DIAGNOSIS — Z800 Family history of malignant neoplasm of digestive organs: Secondary | ICD-10-CM

## 2017-01-02 DIAGNOSIS — R1012 Left upper quadrant pain: Secondary | ICD-10-CM

## 2017-01-02 DIAGNOSIS — I2510 Atherosclerotic heart disease of native coronary artery without angina pectoris: Secondary | ICD-10-CM | POA: Diagnosis not present

## 2017-01-02 DIAGNOSIS — R1013 Epigastric pain: Secondary | ICD-10-CM | POA: Diagnosis not present

## 2017-01-02 MED ORDER — PANTOPRAZOLE SODIUM 40 MG PO TBEC: 40.0000 mg | DELAYED_RELEASE_TABLET | Freq: Every day | ORAL | 11 refills | Status: DC

## 2017-01-02 NOTE — Patient Instructions (Signed)
You have been scheduled for an abdominal ultrasound at [facility] (1st floor of hospital) on 01/07/2017 at 9am. Please arrive 15 minutes prior to your appointment for registration. Make certain not to have anything to eat or drink after midnight prior to your appointment. Should you need to reschedule your appointment, please contact radiology at [phone]. This test typically takes about 30 minutes to perform.  You have been scheduled for an endoscopy. Please follow written instructions given to you at your visit today. If you use inhalers (even only as needed), please bring them with you on the day of your procedure. Your physician has requested that you go to www.startemmi.com and enter the access code given to you at your visit today. This web site gives a general overview about your procedure. However, you should still follow specific instructions given to you by our office regarding your preparation for the procedure.  Continue Protonix   Follow up in 3 months

## 2017-01-02 NOTE — Progress Notes (Signed)
Rhonda Brewer    [id number]    01-22-1951  Primary Care Physician:Hensel, Jamal Collin, MD  Referring Physician: [name], MD [address] [place], [place] Brewer [address]  Chief complaint: Left upper quadrant /epigastric abdominal pain   HPI:  23 yr F with h/o scleroderma, CAD s/p stent 2017, SVT s/p ablatiion 20015, CHF, uterine cancer status post TAH/BSO , chemotherapy here with c/o left upper quadrant abominal pain. LUQ pain started about 2 months ago, comes and goes, was started on Pntoprazole, improved somewhat with decreased intensity but continues to have burning sensation. She feels better after a meal. She wakes up occasionally at bedtime with pain. Occasional nausea, no vomiting. No weight loss. No change in bowel movements.  No change in diet or meds recently. Exercises regularly with either yoga/Zumba, or cardio with small weight.  She had leave the Zumba cardio class with weights one day due to significant left upper quadrant abdominal pain.  Brother had stomach cancer at age 38, Maternal uncle also passed away with stomach cancer in his 80's Ovarian  Cancer in maternal grandmother    Outpatient Encounter Prescriptions as of 01/02/2017  Medication Sig  . aspirin EC 81 MG tablet Take by mouth.  Rhonda Brewer atorvastatin (LIPITOR) 40 MG tablet Take 40 mg by mouth daily.   Rhonda Brewer glucosamine-chondroitin 500-400 MG tablet Take 1 tablet by mouth daily.   . hydroxychloroquine (PLAQUENIL) 200 MG tablet Take 400 mg by mouth daily. Monday through Friday  . levothyroxine (SYNTHROID) 75 MCG tablet Take 75 mcg by mouth daily before breakfast.   . Multiple Vitamin (MULTI-VITAMINS) TABS Take 1 tablet by mouth daily.   . pantoprazole (PROTONIX) 40 MG tablet Take 1 tablet (40 mg total) by mouth daily.  . vitamin B-12 (CYANOCOBALAMIN) 100 MCG tablet Take 100 mcg by mouth daily.  . [DISCONTINUED] clopidogrel (PLAVIX) 75 MG tablet Take 1 tablet (75 mg total) by mouth daily.  .  [DISCONTINUED] Estriol 10 % CREA Place 1 application vaginally 2 (two) times a week.    No facility-administered encounter medications on file as of 01/02/2017.     Allergies as of 01/02/2017 - Review Complete 01/02/2017  Allergen Reaction Noted  . Avelox [moxifloxacin hcl in nacl] Other (See Comments) 06/19/2016    Past Medical History:  Diagnosis Date  . CAD (coronary artery disease)    a. DES to RCA 11/2015  . Cancer [facility])    Uterine 2008    Past Surgical History:  Procedure Laterality Date  . ABDOMINAL HYSTERECTOMY    . APPENDECTOMY    . CARDIAC ELECTROPHYSIOLOGY MAPPING AND ABLATION  04/19/2003  . CORONARY ANGIOPLASTY WITH STENT PLACEMENT      Family History  Problem Relation Age of Onset  . Asthma Maternal Aunt   . Heart disease Mother   . Diabetes Mother   . Heart disease Father   . Stomach cancer Brother     Social History   Social History  . Marital status: Married    Spouse name: Rhonda Brewer  . Number of children: 2  . Years of education: Rhonda Brewer   Occupational History  . Not on file.   Social History Main Topics  . Smoking status: Never Smoker  . Smokeless tobacco: Never Used  . Alcohol use No  . Drug use: No  . Sexual activity: Not on file     Comment: hysterectomy   Other Topics Concern  . Not on file   Social  History Narrative  . No narrative on file      Review of systems: Review of Systems  Constitutional: Negative for fever and chills.  HENT: Negative.   Eyes: Negative for blurred vision.  Respiratory: Negative for cough, shortness of breath and wheezing.   Cardiovascular: Negative for chest pain and palpitations.  Gastrointestinal: as per HPI Genitourinary: Negative for dysuria, urgency, frequency and hematuria.  Musculoskeletal: Negative for myalgias, back pain and joint pain.  Skin: Negative for itching and rash.  Neurological: Negative for dizziness, tremors, focal weakness, seizures and loss of consciousness.  Endo/Heme/Allergies:  Negative for seasonal allergies.  Psychiatric/Behavioral: Negative for depression, suicidal ideas and hallucinations.  All other systems reviewed and are negative.   Physical Exam: Vitals:   01/02/17 0909  BP: 110/70  Pulse: 68   Body mass index is 23.86 kg/m. Gen:      No acute distress HEENT:  EOMI, sclera anicteric Neck:     No masses; no thyromegaly Lungs:    Clear to auscultation bilaterally; normal respiratory effort CV:         Regular rate and rhythm; no murmurs Abd:      + bowel sounds; soft, non-tender; no palpable masses, no distension Ext:    No edema; adequate peripheral perfusion Skin:      Warm and dry; no rash Neuro: alert and oriented x 3 Psych: normal mood and affect  Data Reviewed:  Reviewed labs, radiology imaging, old records and pertinent past GI work up  Colonoscopy : Novemeber 21, 2014 Small internal hemorrhoids otherwise normal exam 10 yr recall  Assessment and Plan/Recommendations:  2 yr F with h/o scleroderma, CAD s/p stent 2017, SVT s/p ablatiion 20015, CHF, uterine cancer status post TAH/BSO , chemotherapy here with c/o left upper quadrant abominal pain. Family history of gastric cancer in brother and maternal uncle We'll schedule for EGD for evaluation Continue Protonix Antireflux measures Small frequent meals  Also obtain abdominal ultrasound to exclude gallbladder disease  Return in 2 months or sooner     Rhonda Brewer , MD [id number] Mon-Fri 8a-5p [phone] after 5p, weekends, holidays  CC: Hensel, Jamal Collin, MD

## 2017-01-03 ENCOUNTER — Ambulatory Visit (AMBULATORY_SURGERY_CENTER): Payer: Medicare Other | Admitting: Gastroenterology

## 2017-01-03 ENCOUNTER — Encounter: Payer: Self-pay | Admitting: Gastroenterology

## 2017-01-03 VITALS — BP 127/66 | HR 54 | Temp 97.1°F | Resp 12 | Ht 64.0 in | Wt 139.0 lb

## 2017-01-03 DIAGNOSIS — I2510 Atherosclerotic heart disease of native coronary artery without angina pectoris: Secondary | ICD-10-CM | POA: Diagnosis not present

## 2017-01-03 DIAGNOSIS — Z800 Family history of malignant neoplasm of digestive organs: Secondary | ICD-10-CM | POA: Diagnosis not present

## 2017-01-03 DIAGNOSIS — K219 Gastro-esophageal reflux disease without esophagitis: Secondary | ICD-10-CM | POA: Diagnosis not present

## 2017-01-03 DIAGNOSIS — R1012 Left upper quadrant pain: Secondary | ICD-10-CM | POA: Diagnosis not present

## 2017-01-03 DIAGNOSIS — R1013 Epigastric pain: Secondary | ICD-10-CM | POA: Diagnosis not present

## 2017-01-03 DIAGNOSIS — K208 Other esophagitis: Secondary | ICD-10-CM | POA: Diagnosis not present

## 2017-01-03 MED ORDER — SODIUM CHLORIDE 0.9 % IV SOLN: 500.0000 mL | INTRAVENOUS | Status: DC

## 2017-01-03 NOTE — Progress Notes (Signed)
Called to room to assist during endoscopic procedure.  Patient ID and intended procedure confirmed with present staff. Received instructions for my participation in the procedure from the performing physician.  

## 2017-01-03 NOTE — Patient Instructions (Signed)
**   Make appt to be seen in the office in 3 months **   YOU HAD AN ENDOSCOPIC PROCEDURE TODAY AT [place]:   Refer to the procedure report that was given to you for any specific questions about what was found during the examination.  If the procedure report does not answer your questions, please call your gastroenterologist to clarify.  If you requested that your care partner not be given the details of your procedure findings, then the procedure report has been included in a sealed envelope for you to review at your convenience later.  YOU SHOULD EXPECT: Some feelings of bloating in the abdomen. Passage of more gas than usual.  Walking can help get rid of the air that was put into your GI tract during the procedure and reduce the bloating. If you had a lower endoscopy (such as a colonoscopy or flexible sigmoidoscopy) you may notice spotting of blood in your stool or on the toilet paper. If you underwent a bowel prep for your procedure, you may not have a normal bowel movement for a few days.  Please Note:  You might notice some irritation and congestion in your nose or some drainage.  This is from the oxygen used during your procedure.  There is no need for concern and it should clear up in a day or so.  SYMPTOMS TO REPORT IMMEDIATELY:   Following upper endoscopy (EGD)  Vomiting of blood or coffee ground material  New chest pain or pain under the shoulder blades  Painful or persistently difficult swallowing  New shortness of breath  Fever of 100F or higher  Black, tarry-looking stools  For urgent or emergent issues, a gastroenterologist can be reached at any hour by calling [phone].   DIET:  We do recommend a small meal at first, but then you may proceed to your regular diet.  Drink plenty of fluids but you should avoid alcoholic beverages for 24 hours.  ACTIVITY:  You should plan to take it easy for the rest of today and you should NOT DRIVE or use heavy  machinery until tomorrow (because of the sedation medicines used during the test).    FOLLOW UP: Our staff will call the number listed on your records the next business day following your procedure to check on you and address any questions or concerns that you may have regarding the information given to you following your procedure. If we do not reach you, we will leave a message.  However, if you are feeling well and you are not experiencing any problems, there is no need to return our call.  We will assume that you have returned to your regular daily activities without incident.  If any biopsies were taken you will be contacted by phone or by letter within the next 1-3 weeks.  Please call us at [phone] if you have not heard about the biopsies in 3 weeks.    SIGNATURES/CONFIDENTIALITY: You and/or your care partner have signed paperwork which will be entered into your electronic medical record.  These signatures attest to the fact that that the information above on your After Visit Summary has been reviewed and is understood.  Full responsibility of the confidentiality of this discharge information lies with you and/or your care-partner.

## 2017-01-03 NOTE — Progress Notes (Signed)
Report to PACU, RN, vss, BBS= Clear.  

## 2017-01-03 NOTE — Op Note (Signed)
[place] Patient Name: Rhonda Brewer Procedure Date: 01/03/2017 10:09 AM MRN: [id number] Endoscopist: Mauri Pole , MD Age: 66 Referring MD:  Date of Birth: 20-Apr-1950 Gender: Female Account #: [id number] Procedure:                Upper GI endoscopy Indications:              Epigastric abdominal pain, Abdominal pain in the                            left upper quadrant Medicines:                Monitored Anesthesia Care Procedure:                Pre-Anesthesia Assessment:                           - Prior to the procedure, a History and Physical                            was performed, and patient medications and                            allergies were reviewed. The patient's tolerance of                            previous anesthesia was also reviewed. The risks                            and benefits of the procedure and the sedation                            options and risks were discussed with the patient.                            All questions were answered, and informed consent                            was obtained. Prior Anticoagulants: The patient has                            taken no previous anticoagulant or antiplatelet                            agents. ASA Grade Assessment: II - A patient with                            mild systemic disease. After reviewing the risks                            and benefits, the patient was deemed in                            satisfactory condition to undergo the procedure.  After obtaining informed consent, the endoscope was                            passed under direct vision. Throughout the                            procedure, the patient's blood pressure, pulse, and                            oxygen saturations were monitored continuously. The                            Model GIF-HQ190 [id number]) scope was introduced                            through the mouth, and  advanced to the second part                            of duodenum. The upper GI endoscopy was                            accomplished without difficulty. The patient                            tolerated the procedure well. Scope In: Scope Out: Findings:                 The esophagus and gastroesophageal junction were                            examined with white light and narrow band imaging                            (NBI) from a forward view and retroflexed position.                            There were esophageal mucosal changes suspicious                            for short-segment Barrett's esophagus. These                            changes involved the mucosa at the upper extent of                            the gastric folds (40 cm from the incisors)                            extending to the Z-line (39 cm from the incisors).                            Islands of salmon-colored mucosa were present at 62  cm and erosion and nodularity was present from 38                            to 40 cm. The maximum longitudinal extent of these                            esophageal mucosal changes was 1 cm in length.                            Mucosa was biopsied with a cold forceps for                            histology in a targeted manner at intervals of 1 cm                            at the gastroesophageal junction. One specimen                            bottle was sent to pathology.                           The stomach was normal.                           The examined duodenum was normal. Complications:            No immediate complications. Estimated Blood Loss:     Estimated blood loss was minimal. Impression:               - Esophageal mucosal changes suspicious for                            short-segment Barrett's esophagus. Biopsied.                           - Normal stomach.                           - Normal examined duodenum. Recommendation:            - Patient has a contact number available for                            emergencies. The signs and symptoms of potential                            delayed complications were discussed with the                            patient. Return to normal activities tomorrow.                            Written discharge instructions were provided to the                            patient.                           -  Resume previous diet.                           - Continue present medications.                           - Await pathology results.                           - Repeat upper endoscopy after studies are complete                            for surveillance based on pathology results.                           - Return to GI clinic in 3 months. Mauri Pole, MD 01/03/2017 10:25:47 AM This report has been signed electronically.

## 2017-01-06 ENCOUNTER — Ambulatory Visit (INDEPENDENT_AMBULATORY_CARE_PROVIDER_SITE_OTHER): Payer: Medicare Other | Admitting: Rheumatology

## 2017-01-06 ENCOUNTER — Encounter: Payer: Self-pay | Admitting: Rheumatology

## 2017-01-06 ENCOUNTER — Telehealth: Payer: Self-pay

## 2017-01-06 VITALS — BP 120/67 | HR 74 | Ht 64.0 in | Wt 140.0 lb

## 2017-01-06 DIAGNOSIS — Z8679 Personal history of other diseases of the circulatory system: Secondary | ICD-10-CM | POA: Diagnosis not present

## 2017-01-06 DIAGNOSIS — Z8639 Personal history of other endocrine, nutritional and metabolic disease: Secondary | ICD-10-CM

## 2017-01-06 DIAGNOSIS — Z8542 Personal history of malignant neoplasm of other parts of uterus: Secondary | ICD-10-CM

## 2017-01-06 DIAGNOSIS — M8589 Other specified disorders of bone density and structure, multiple sites: Secondary | ICD-10-CM

## 2017-01-06 DIAGNOSIS — Z79899 Other long term (current) drug therapy: Secondary | ICD-10-CM

## 2017-01-06 DIAGNOSIS — M349 Systemic sclerosis, unspecified: Secondary | ICD-10-CM | POA: Diagnosis not present

## 2017-01-06 DIAGNOSIS — D696 Thrombocytopenia, unspecified: Secondary | ICD-10-CM | POA: Diagnosis not present

## 2017-01-06 LAB — CBC WITH DIFFERENTIAL/PLATELET
BASOS PCT: 0.3 %
Basophils Absolute: 19 cells/uL (ref 0–200)
EOS PCT: 1.6 %
Eosinophils Absolute: 101 cells/uL (ref 15–500)
HCT: 43.8 % (ref 35.0–45.0)
Hemoglobin: 14.8 g/dL (ref 11.7–15.5)
Lymphs Abs: 1625 cells/uL (ref 850–3900)
MCH: 31.8 pg (ref 27.0–33.0)
MCHC: 33.8 g/dL (ref 32.0–36.0)
MCV: 94 fL (ref 80.0–100.0)
MONOS PCT: 8.8 %
MPV: 11.3 fL (ref 7.5–12.5)
NEUTROS ABS: 4001 {cells}/uL (ref 1500–7800)
Neutrophils Relative %: 63.5 %
PLATELETS: 129 10*3/uL — AB (ref 140–400)
RBC: 4.66 10*6/uL (ref 3.80–5.10)
RDW: 12 % (ref 11.0–15.0)
TOTAL LYMPHOCYTE: 25.8 %
WBC mixed population: 554 cells/uL (ref 200–950)
WBC: 6.3 10*3/uL (ref 3.8–10.8)

## 2017-01-06 LAB — COMPLETE METABOLIC PANEL WITH GFR
AG Ratio: 1.8 (calc) (ref 1.0–2.5)
ALT: 20 U/L (ref 6–29)
AST: 23 U/L (ref 10–35)
Albumin: 4.4 g/dL (ref 3.6–5.1)
Alkaline phosphatase (APISO): 70 U/L (ref 33–130)
BILIRUBIN TOTAL: 0.6 mg/dL (ref 0.2–1.2)
BUN: 14 mg/dL (ref 7–25)
CALCIUM: 9.5 mg/dL (ref 8.6–10.4)
CHLORIDE: 102 mmol/L (ref 98–110)
CO2: 28 mmol/L (ref 20–32)
Creat: 0.81 mg/dL (ref 0.50–0.99)
GFR, EST AFRICAN AMERICAN: 88 mL/min/{1.73_m2} (ref >=60)
GFR, Est Non African American: 76 mL/min/{1.73_m2} (ref >=60)
GLUCOSE: 82 mg/dL (ref 65–99)
Globulin: 2.4 g/dL (calc) (ref 1.9–3.7)
Potassium: 4 mmol/L (ref 3.5–5.3)
Sodium: 139 mmol/L (ref 135–146)
Total Protein: 6.8 g/dL (ref 6.1–8.1)

## 2017-01-06 NOTE — Telephone Encounter (Signed)
  Follow up Call-  Call Rhonda Brewer number 01/03/2017  Post procedure Call Rhonda Brewer phone  # [phone]  Permission to leave phone message Yes     Patient questions:  Do you have a fever, pain , or abdominal swelling? No. Pain Score  0 *  Have you tolerated food without any problems? Yes.    Have you been able to return to your normal activities? Yes.    Do you have any questions about your discharge instructions: Diet   No. Medications  No. Follow up visit  No.  Do you have questions or concerns about your Care? No.  Actions: * If pain score is 4 or above: No action needed, pain <4.

## 2017-01-06 NOTE — Patient Instructions (Signed)
Standing Labs We placed an order today for your standing lab work.    Please come back and get your standing labs in January and every 3 month  We have open lab Monday through Friday from 8:30-11:30 AM and 1:30-4 PM at the office of Dr. Bo Merino.   The office is located at [address], [place], [place], [place] [address] No appointment is necessary.   Labs are drawn by [facility].  You may receive a bill from [facility] for your lab work. If you have any questions regarding directions or hours of operation,  please call [phone].

## 2017-01-07 ENCOUNTER — Ambulatory Visit (HOSPITAL_COMMUNITY)
Admission: RE | Admit: 2017-01-07 | Discharge: 2017-01-07 | Disposition: A | Discharge status: Home / Self Care | Payer: Medicare Other | Source: Ambulatory Visit | Attending: Gastroenterology | Admitting: Gastroenterology

## 2017-01-07 DIAGNOSIS — R1013 Epigastric pain: Secondary | ICD-10-CM | POA: Insufficient documentation

## 2017-01-07 DIAGNOSIS — Z800 Family history of malignant neoplasm of digestive organs: Secondary | ICD-10-CM | POA: Insufficient documentation

## 2017-01-07 DIAGNOSIS — R1012 Left upper quadrant pain: Secondary | ICD-10-CM | POA: Diagnosis not present

## 2017-01-07 DIAGNOSIS — R109 Unspecified abdominal pain: Secondary | ICD-10-CM | POA: Diagnosis not present

## 2017-01-07 NOTE — Progress Notes (Signed)
Labs are stable.

## 2017-01-08 ENCOUNTER — Encounter: Payer: Self-pay | Admitting: Gastroenterology

## 2017-01-10 ENCOUNTER — Ambulatory Visit
Admission: RE | Admit: 2017-01-10 | Discharge: 2017-01-10 | Disposition: A | Discharge status: Home / Self Care | Payer: Medicare Other | Source: Ambulatory Visit | Attending: Family Medicine | Admitting: Family Medicine

## 2017-01-10 DIAGNOSIS — Z1231 Encounter for screening mammogram for malignant neoplasm of breast: Secondary | ICD-10-CM

## 2017-02-19 ENCOUNTER — Other Ambulatory Visit: Payer: Self-pay | Admitting: Rheumatology

## 2017-02-19 MED ORDER — HYDROXYCHLOROQUINE SULFATE 200 MG PO TABS: 400.0000 mg | ORAL_TABLET | Freq: Every day | ORAL | 0 refills | Status: DC

## 2017-02-19 NOTE — Telephone Encounter (Addendum)
Last Visit: 01/06/17 Next Visit: 07/10/17 Labs : 01/06/17 stable PLQ Eye Exam: 07/22/16 WNL Dr. Rae Roam to refill per Dr. Estanislado Pandy.

## 2017-02-19 NOTE — Telephone Encounter (Signed)
Patient called requesting an RX refill on her PLQ.  She would like it called into [facility].  CB#[phone].  Thank you.

## 2017-03-20 ENCOUNTER — Other Ambulatory Visit: Payer: Self-pay

## 2017-03-20 ENCOUNTER — Ambulatory Visit (INDEPENDENT_AMBULATORY_CARE_PROVIDER_SITE_OTHER): Payer: Medicare Other | Admitting: Family Medicine

## 2017-03-20 ENCOUNTER — Encounter: Payer: Self-pay | Admitting: Family Medicine

## 2017-03-20 DIAGNOSIS — J069 Acute upper respiratory infection, unspecified: Secondary | ICD-10-CM

## 2017-03-20 NOTE — Patient Instructions (Signed)
This seems to just be a bad cold/virus/upper respiratory infection.  You have no evidence of a bacterial infection such as pneumonia.  Therefore antibiotics will not help.   You should steadily get better.  Let me know if you get worse. Also let me know if you have no improvement by next Monday.  A weak indication for antibiotics is a cold that last more than 7 days without improvement. For over the counter meds - only take if they make feel better.  The two most likely to help are: Tylenol or advil which helps pain and fever. Robitussin DM - the dextromenthorphan helps with cough. Keep enjoying your grandchildren. Get a flu shot when you feel better.

## 2017-03-21 ENCOUNTER — Encounter: Payer: Self-pay | Admitting: Family Medicine

## 2017-03-21 DIAGNOSIS — J069 Acute upper respiratory infection, unspecified: Secondary | ICD-10-CM | POA: Insufficient documentation

## 2017-03-21 NOTE — Progress Notes (Signed)
   Subjective:    Patient ID: Rhonda Brewer, female    DOB: [birth year], 67 y.o.   MRN: [id number]  HPI  Acute resp illness x 5 days.  Cough, dry, non productive.  Nasal congestion. "It started like a sinus problem."  Some fever but not high and no chills.  Did not yet get flu shot.  Feels in chest but no SOB.  Infectious exposure was her sick granddaughter.  Feels that she is improving slightly today. No prominent myalgias.    Review of Systems     Objective:   Physical Exam Non toxic. TMs normal Throat, slightly injected without exudate. Neck no sig nodes. Lungs clear, no wheeze or rales. Cardiac RRR without m or g Skin, no rash.       Assessment & Plan:

## 2017-03-21 NOTE — Assessment & Plan Note (Signed)
Almost certainly viral.  Expectant observation.  OK to use OTC symptomatic relief.  Call if worsens.

## 2017-03-24 DIAGNOSIS — Z8542 Personal history of malignant neoplasm of other parts of uterus: Secondary | ICD-10-CM | POA: Diagnosis not present

## 2017-03-24 DIAGNOSIS — Z124 Encounter for screening for malignant neoplasm of cervix: Secondary | ICD-10-CM | POA: Diagnosis not present

## 2017-03-24 DIAGNOSIS — Z8041 Family history of malignant neoplasm of ovary: Secondary | ICD-10-CM | POA: Diagnosis not present

## 2017-03-24 DIAGNOSIS — Z01419 Encounter for gynecological examination (general) (routine) without abnormal findings: Secondary | ICD-10-CM | POA: Diagnosis not present

## 2017-03-24 DIAGNOSIS — Z6824 Body mass index (BMI) 24.0-24.9, adult: Secondary | ICD-10-CM | POA: Diagnosis not present

## 2017-03-24 DIAGNOSIS — Z800 Family history of malignant neoplasm of digestive organs: Secondary | ICD-10-CM | POA: Diagnosis not present

## 2017-03-31 DIAGNOSIS — R350 Frequency of micturition: Secondary | ICD-10-CM | POA: Diagnosis not present

## 2017-03-31 DIAGNOSIS — N390 Urinary tract infection, site not specified: Secondary | ICD-10-CM | POA: Diagnosis not present

## 2017-04-02 DIAGNOSIS — Z23 Encounter for immunization: Secondary | ICD-10-CM | POA: Diagnosis not present

## 2017-04-03 DIAGNOSIS — R3915 Urgency of urination: Secondary | ICD-10-CM | POA: Diagnosis not present

## 2017-04-03 DIAGNOSIS — R300 Dysuria: Secondary | ICD-10-CM | POA: Diagnosis not present

## 2017-04-03 DIAGNOSIS — N390 Urinary tract infection, site not specified: Secondary | ICD-10-CM | POA: Diagnosis not present

## 2017-04-05 ENCOUNTER — Other Ambulatory Visit: Payer: Self-pay | Admitting: Rheumatology

## 2017-04-07 NOTE — Telephone Encounter (Signed)
Last Visit: 01/06/17 Next Visit: 07/10/17 Labs: 01/06/17 Stable PLQ Eye exam: 07/22/16 WNL   Okay to refill per Dr. Estanislado Pandy

## 2017-04-21 DIAGNOSIS — R300 Dysuria: Secondary | ICD-10-CM | POA: Diagnosis not present

## 2017-04-21 DIAGNOSIS — R3915 Urgency of urination: Secondary | ICD-10-CM | POA: Diagnosis not present

## 2017-04-23 ENCOUNTER — Encounter: Payer: Self-pay | Admitting: Family Medicine

## 2017-04-23 DIAGNOSIS — G8929 Other chronic pain: Secondary | ICD-10-CM

## 2017-04-23 DIAGNOSIS — M545 Low back pain, unspecified: Secondary | ICD-10-CM

## 2017-04-29 DIAGNOSIS — M545 Low back pain, unspecified: Secondary | ICD-10-CM | POA: Insufficient documentation

## 2017-04-29 DIAGNOSIS — G8929 Other chronic pain: Secondary | ICD-10-CM

## 2017-04-30 NOTE — Progress Notes (Signed)
Office Visit Note  Patient: Rhonda Brewer             Date of Birth: September 21, 1950           MRN: [id number]             PCP: [name], MD Referring: [name], MD Visit Date: 05/01/2017 Occupation: @GUAROCC @    Subjective:  Pain and numbness in bilateral hands   History of Present Illness: Rhonda Brewer is a 67 y.o. female with history of scleroderma.  Patient takes PLQ 200 mg BID Monday-Friday.  She has to schedule her next eye exam.  She reports for the past 6 weeks she has been waking up periodically throughout the night with bilateral hand pain and numbness.  She states her symptoms are worse in her right hand.  She describes the sensation as "pins and needles."  She has to change positions and shake her hands out in the night.  she denies any overuse activities, but she does do yoga and weight lifting exercises occasionally. She denies a history of carpal tunnel.  She denies any joint swelling.  She does have joint stiffness, especially in the morning.  Her lower back has been causing increased stiffness and discomfort.  She denies any numbness or shooting pains.  Her left pes aneserinus buritis has improved.  She reports any worsening of her scleroderma.  She reports she continues to have skin tightness in her fingers.  She denies any digital ulcers or new telangiectasias. She continues to see Dr. Chase Caller and her cardiologist on a yearly basis.        Activities of Daily Living:  Patient reports morning stiffness for 5-7  minutes.   Patient Reports nocturnal pain.  Difficulty dressing/grooming: Denies Difficulty climbing stairs: Denies Difficulty getting out of chair: Denies Difficulty using hands for taps, buttons, cutlery, and/or writing: Denies   Review of Systems  Constitutional: Negative for fatigue and weakness.  HENT: Negative for mouth sores, mouth dryness and nose dryness.   Eyes: Positive for dryness (Restasis ). Negative for pain, redness and visual  disturbance.  Respiratory: Negative for cough, hemoptysis, shortness of breath and difficulty breathing.   Cardiovascular: Negative for chest pain, palpitations, hypertension, irregular heartbeat and swelling in legs/feet.  Gastrointestinal: Negative for blood in stool, constipation and diarrhea.  Endocrine: Negative for increased urination.  Genitourinary: Negative for painful urination.  Musculoskeletal: Positive for morning stiffness. Negative for arthralgias, joint pain, joint swelling, myalgias, muscle weakness, muscle tenderness and myalgias.  Skin: Positive for color change and skin tightness. Negative for pallor, rash, hair loss, nodules/bumps, redness, ulcers and sensitivity to sunlight.  Neurological: Negative for dizziness, numbness and headaches.  Hematological: Negative for swollen glands.  Psychiatric/Behavioral: Negative for depressed mood and sleep disturbance. The patient is not nervous/anxious.     PMFS History:  Patient Active Problem List   Diagnosis Date Noted  . Chronic low back pain 04/29/2017  . Screening for breast cancer 09/04/2016  . Pes anserinus bursitis of left knee 09/04/2016  . Dyspnea and respiratory abnormalities 08/16/2016  . LUQ abdominal pain 06/26/2016  . H/O cardiac radiofrequency ablation 06/26/2016  . CAD (coronary artery disease), native coronary artery 06/19/2016  . History of hypothyroidism 06/19/2016  . History of uterine cancer 06/19/2016  . Scleroderma ([place]) 06/16/2016  . High risk medication use 06/16/2016  . Osteopenia of multiple sites 06/16/2016    Past Medical History:  Diagnosis Date  . Allergy   . Arthritis   .  CAD (coronary artery disease)    a. DES to RCA 11/2015  . Cancer [facility])    Uterine 2008  . Heart murmur   . Hyperlipidemia   . Osteoporosis   . Thyroid disease     Family History  Problem Relation Age of Onset  . Asthma Maternal Aunt   . Heart disease Mother   . Diabetes Mother   . Heart disease Father   .  Stomach cancer Brother   . Colon cancer Neg Hx   . Esophageal cancer Neg Hx   . Rectal cancer Neg Hx    Past Surgical History:  Procedure Laterality Date  . ABDOMINAL HYSTERECTOMY    . APPENDECTOMY    . CARDIAC ELECTROPHYSIOLOGY MAPPING AND ABLATION  04/19/2003  . CORONARY ANGIOPLASTY WITH STENT PLACEMENT     Social History   Social History Narrative  . Not on file     Objective: Vital Signs: BP 119/68 (BP Location: Left Arm, Patient Position: Sitting, Cuff Size: Small)   Pulse 64   Resp 12   Ht 5\' 3"  (1.6 m)   Wt 138 lb (62.6 kg)   BMI 24.45 kg/m    Physical Exam  Constitutional: She is oriented to person, place, and time. She appears well-developed and well-nourished.  HENT:  Head: Normocephalic and atraumatic.  Eyes: Conjunctivae and EOM are normal.  Neck: Normal range of motion.  Cardiovascular: Normal rate, regular rhythm, normal heart sounds and intact distal pulses.  Pulmonary/Chest: Effort normal and breath sounds normal.  Abdominal: Soft. Bowel sounds are normal.  Lymphadenopathy:    She has no cervical adenopathy.  Neurological: She is alert and oriented to person, place, and time.  Skin: Skin is warm and dry. Capillary refill takes less than 2 seconds.  Psychiatric: She has a normal mood and affect. Her behavior is normal.  Nursing note and vitals reviewed.    Musculoskeletal Exam: C-spine, thoracic, and lumbar spine good ROM.  No midline spinal tenderness.  No SI joint tenderness.  Shoulder joints, elbow joints, wrist joints, MCPs, PIPs, and DIPs good ROM.  She has some tightness in her fingers when making a fist.  Hip joints, knee joints, ankle joints, MTPs, PIPs, and DIPs good ROM with no synovitis.  No warmth or effusion of knees.  She has bilateral knee crepitus. No tenderness of trochanteric bursa.  Positive Phalen's bilaterally.  Positive Tinel's sign of right wrist.   CDAI Exam: No CDAI exam completed.    Investigation: No additional  findings. CBC Latest Ref Rng & Units 01/06/2017 06/19/2016  WBC 3.8 - 10.8 Thousand/uL 6.3 5.0  Hemoglobin 11.7 - 15.5 g/dL 14.8 14.8  Hematocrit 35.0 - 45.0 % 43.8 44.9  Platelets 140 - 400 Thousand/uL 129(L) 88(L)   CMP Latest Ref Rng & Units 01/06/2017 06/19/2016  Glucose 65 - 99 mg/dL 82 82  BUN 7 - 25 mg/dL 14 14  Creatinine 0.50 - 0.99 mg/dL 0.81 0.79  Sodium 135 - 146 mmol/L 139 139  Potassium 3.5 - 5.3 mmol/L 4.0 4.2  Chloride 98 - 110 mmol/L 102 104  CO2 20 - 32 mmol/L 28 24  Calcium 8.6 - 10.4 mg/dL 9.5 9.5  Total Protein 6.1 - 8.1 g/dL 6.8 6.8  Total Bilirubin 0.2 - 1.2 mg/dL 0.6 0.7  Alkaline Phos 33 - 130 U/L - 64  AST 10 - 35 U/L 23 27  ALT 6 - 29 U/L 20 24    Imaging: No results found.  Speciality Comments: PLQ Eye Exam:  07/22/16 WNL @ [facility]    Procedures:  No procedures performed Allergies: Avelox [moxifloxacin hcl in nacl] and Other   Assessment / Plan:     Visit Diagnoses: Scleroderma ([place]) - Limited systemic w/ Raynauds, Telengectesia's, sclerodactyly, arthralgias, erosions in right 5th and left 3rd DIP, ANA centromere.  She has skin tightness and telangectasia's of her fingers bilaterally.  She has no synovitis or tenderness on exam.  She continues to see Dr. Chase Caller and her cardiologist yearly.  She takes Plaquenil 200 mg BID M-F.  She does not need any refills today.  She is clinically doing well on PLQ.    High risk medication use - PLQ 200 mg BID M-F: Eye exam 07/22/16 WNL @ [facility].  She is going to schedule her eye exam. CBC/CMP 01/06/2017.  CBC and CMP were obtained today to monitor for drug toxicity.- Plan: CBC with Differential/Platelet, COMPLETE METABOLIC PANEL WITH GFR  Osteopenia of multiple sites - DEXA 08/2016, T-score -1.6 L femur  Pes anserinus bursitis of left knee: Improving.  No tenderness on exam. She has good ROM.  She has bilateral knee crepitus.  No warmth or effusion.   Chronic low back pain without sciatica, unspecified  back pain laterality: She has chronic lower back pain and morning stiffness.  No midline or SI joint tenderness on exam.  No radiation of pain.   Paresthesia and pain of both upper extremities -She has bilateral hand pain and numbness worse at night. She has paresthesias in the median nerve distribution bilaterally. She does not have a history of carpal tunnel and has not experienced symptoms like this until 6 weeks ago.  The numbness and pain have been waking her up at night.  She has a positive Phalen's bilaterally and positive Tinel's sign of right wrist.  A referral was made for a nerve conduction velocity test with Dr. Ernestina Patches to evaluate for carpal tunnel syndrome.  She was given a prescription for carpal tunnel wrist splints that she can wear at night.  She was encouraged to avoid repetitive overuse type activities.  Patient is in agreeance with the plan.  Plan: Ambulatory referral to Physical Medicine Rehab   Other medical conditions are listed as follows:   Thrombocytopenia ([place])  History of hypothyroidism  History of uterine cancer - 2008, s/p hysterectomy, chemo, and radiation   H/O cardiac radiofrequency ablation  History of coronary artery disease - s/p stent     Orders: Orders Placed This Encounter  Procedures  . CBC with Differential/Platelet  . COMPLETE METABOLIC PANEL WITH GFR  . Ambulatory referral to Physical Medicine Rehab   No orders of the defined types were placed in this encounter.   Face-to-face time spent with patient was 30 minutes. Greater than 50% of time was spent in counseling and coordination of care.  Follow-Up Instructions: Return for Scleroderma.   Ofilia Neas, PA-C  Note - This record has been created using Dragon software.  Chart creation errors have been sought, but may not always  have been located. Such creation errors do not reflect on  the standard of medical care.

## 2017-05-01 ENCOUNTER — Encounter: Payer: Self-pay | Admitting: Physician Assistant

## 2017-05-01 ENCOUNTER — Ambulatory Visit (INDEPENDENT_AMBULATORY_CARE_PROVIDER_SITE_OTHER): Payer: Medicare Other | Admitting: Physician Assistant

## 2017-05-01 VITALS — BP 119/68 | HR 64 | Resp 12 | Ht 63.0 in | Wt 138.0 lb

## 2017-05-01 DIAGNOSIS — G8929 Other chronic pain: Secondary | ICD-10-CM

## 2017-05-01 DIAGNOSIS — M8589 Other specified disorders of bone density and structure, multiple sites: Secondary | ICD-10-CM

## 2017-05-01 DIAGNOSIS — D696 Thrombocytopenia, unspecified: Secondary | ICD-10-CM | POA: Diagnosis not present

## 2017-05-01 DIAGNOSIS — M545 Low back pain, unspecified: Secondary | ICD-10-CM

## 2017-05-01 DIAGNOSIS — Z8542 Personal history of malignant neoplasm of other parts of uterus: Secondary | ICD-10-CM | POA: Diagnosis not present

## 2017-05-01 DIAGNOSIS — Z8679 Personal history of other diseases of the circulatory system: Secondary | ICD-10-CM

## 2017-05-01 DIAGNOSIS — M349 Systemic sclerosis, unspecified: Secondary | ICD-10-CM | POA: Diagnosis not present

## 2017-05-01 DIAGNOSIS — Z79899 Other long term (current) drug therapy: Secondary | ICD-10-CM | POA: Diagnosis not present

## 2017-05-01 DIAGNOSIS — M79602 Pain in left arm: Secondary | ICD-10-CM

## 2017-05-01 DIAGNOSIS — M7052 Other bursitis of knee, left knee: Secondary | ICD-10-CM

## 2017-05-01 DIAGNOSIS — Z9889 Other specified postprocedural states: Secondary | ICD-10-CM | POA: Diagnosis not present

## 2017-05-01 DIAGNOSIS — R202 Paresthesia of skin: Secondary | ICD-10-CM

## 2017-05-01 DIAGNOSIS — Z8639 Personal history of other endocrine, nutritional and metabolic disease: Secondary | ICD-10-CM | POA: Diagnosis not present

## 2017-05-01 DIAGNOSIS — M79601 Pain in right arm: Secondary | ICD-10-CM

## 2017-05-02 LAB — COMPLETE METABOLIC PANEL WITH GFR
AG RATIO: 1.9 (calc) (ref 1.0–2.5)
ALT: 31 U/L — ABNORMAL HIGH (ref 6–29)
AST: 29 U/L (ref 10–35)
Albumin: 4.6 g/dL (ref 3.6–5.1)
Alkaline phosphatase (APISO): 86 U/L (ref 33–130)
BILIRUBIN TOTAL: 0.5 mg/dL (ref 0.2–1.2)
BUN: 16 mg/dL (ref 7–25)
CHLORIDE: 106 mmol/L (ref 98–110)
CO2: 25 mmol/L (ref 20–32)
Calcium: 9.6 mg/dL (ref 8.6–10.4)
Creat: 0.82 mg/dL (ref 0.50–0.99)
GFR, EST AFRICAN AMERICAN: 86 mL/min/{1.73_m2} (ref >=60)
GFR, Est Non African American: 75 mL/min/{1.73_m2} (ref >=60)
GLUCOSE: 94 mg/dL (ref 65–99)
Globulin: 2.4 g/dL (calc) (ref 1.9–3.7)
POTASSIUM: 4.4 mmol/L (ref 3.5–5.3)
Sodium: 140 mmol/L (ref 135–146)
TOTAL PROTEIN: 7 g/dL (ref 6.1–8.1)

## 2017-05-02 LAB — CBC WITH DIFFERENTIAL/PLATELET
BASOS PCT: 0.6 %
Basophils Absolute: 28 cells/uL (ref 0–200)
EOS ABS: 150 {cells}/uL (ref 15–500)
Eosinophils Relative: 3.2 %
HCT: 42.1 % (ref 35.0–45.0)
Hemoglobin: 14.5 g/dL (ref 11.7–15.5)
Lymphs Abs: 1678 cells/uL (ref 850–3900)
MCH: 32.2 pg (ref 27.0–33.0)
MCHC: 34.4 g/dL (ref 32.0–36.0)
MCV: 93.3 fL (ref 80.0–100.0)
MONOS PCT: 10.1 %
MPV: 10.8 fL (ref 7.5–12.5)
NEUTROS PCT: 50.4 %
Neutro Abs: 2369 cells/uL (ref 1500–7800)
PLATELETS: 173 10*3/uL (ref 140–400)
RBC: 4.51 10*6/uL (ref 3.80–5.10)
RDW: 12.1 % (ref 11.0–15.0)
TOTAL LYMPHOCYTE: 35.7 %
WBC mixed population: 475 cells/uL (ref 200–950)
WBC: 4.7 10*3/uL (ref 3.8–10.8)

## 2017-05-02 NOTE — Progress Notes (Signed)
ALT very mildly elevated.  Please advise her to avoid NSAIDs, tylenol, and alcohol. We will continue to monitor.  All other labs are WNL.

## 2017-05-12 DIAGNOSIS — G8929 Other chronic pain: Secondary | ICD-10-CM | POA: Diagnosis not present

## 2017-05-12 DIAGNOSIS — M545 Low back pain: Secondary | ICD-10-CM | POA: Diagnosis not present

## 2017-05-15 DIAGNOSIS — G8929 Other chronic pain: Secondary | ICD-10-CM | POA: Diagnosis not present

## 2017-05-15 DIAGNOSIS — M545 Low back pain: Secondary | ICD-10-CM | POA: Diagnosis not present

## 2017-05-16 ENCOUNTER — Other Ambulatory Visit: Payer: Self-pay | Admitting: Family Medicine

## 2017-05-16 DIAGNOSIS — R1012 Left upper quadrant pain: Secondary | ICD-10-CM

## 2017-05-19 DIAGNOSIS — G8929 Other chronic pain: Secondary | ICD-10-CM | POA: Diagnosis not present

## 2017-05-19 DIAGNOSIS — M545 Low back pain: Secondary | ICD-10-CM | POA: Diagnosis not present

## 2017-05-21 DIAGNOSIS — G8929 Other chronic pain: Secondary | ICD-10-CM | POA: Diagnosis not present

## 2017-05-21 DIAGNOSIS — M545 Low back pain: Secondary | ICD-10-CM | POA: Diagnosis not present

## 2017-05-23 ENCOUNTER — Encounter (INDEPENDENT_AMBULATORY_CARE_PROVIDER_SITE_OTHER): Payer: Self-pay | Admitting: Physical Medicine and Rehabilitation

## 2017-05-23 ENCOUNTER — Ambulatory Visit (INDEPENDENT_AMBULATORY_CARE_PROVIDER_SITE_OTHER): Payer: Medicare Other | Admitting: Physical Medicine and Rehabilitation

## 2017-05-23 DIAGNOSIS — R202 Paresthesia of skin: Secondary | ICD-10-CM | POA: Diagnosis not present

## 2017-05-23 NOTE — Progress Notes (Signed)
.  Numeric Pain Rating Scale and Functional Assessment Average Pain 4   In the last MONTH (on 0-10 scale) has pain interfered with the following?  1. General activity like being  able to carry out your everyday physical activities such as walking, climbing stairs, carrying groceries, or moving a chair?  Rating(3)   

## 2017-05-26 NOTE — Procedures (Signed)
EMG & NCV Findings: Evaluation of the right median motor nerve showed prolonged distal onset latency (7.1 ms), reduced amplitude (4.7 mV), and decreased conduction velocity (Elbow-Wrist, 46 m/s).  The left median (across palm) sensory nerve showed prolonged distal peak latency (Wrist, 4.5 ms) and prolonged distal peak latency (Palm, 2.1 ms).  The right median (across palm) sensory nerve showed no response (Wrist) and prolonged distal peak latency (Palm, 4.9 ms).  All remaining nerves (as indicated in the following tables) were within normal limits.  Left vs. Right side comparison data for the median motor nerve indicates abnormal L-R latency difference (3.0 ms).  The ulnar motor nerve indicates abnormal L-R amplitude difference (25.6 %).  All remaining left vs. right side differences were within normal limits.    All examined muscles (as indicated in the following table) showed no evidence of electrical instability.    Impression: The above electrodiagnostic study is ABNORMAL and reveals evidence of:  1.  A moderate to severe right median nerve entrapment at the wrist (carpal tunnel syndrome) affecting sensory and motor components.   2.  A mild left median nerve entrapment at the wrist (carpal tunnel syndrome) affecting sensory components.   There is no significant electrodiagnostic evidence of any other focal nerve entrapment, brachial plexopathy or cervical radiculopathy.   Recommendations: 1.  Follow-up with referring physician. 2.  Continue current management of symptoms. 3.  Continue use of resting splint at night-time and as needed during the day. 4.  Suggest surgical evaluation.  Nerve Conduction Studies Anti Sensory Summary Table   Stim Site NR Peak (ms) Norm Peak (ms) P-T Amp (V) Norm P-T Amp Site1 Site2 Delta-P (ms) Dist (cm) Vel (m/s) Norm Vel (m/s)  Left Median Acr Palm Anti Sensory (2nd Digit)  32.5C  Wrist    *4.5 <3.6 20.5 >10 Wrist Palm 2.4 0.0    Palm    *2.1 <2.0 7.0          Right Median Acr Palm Anti Sensory (2nd Digit)  34C  Wrist *NR  <3.6  >10 Wrist Palm  0.0    Palm    *4.9 <2.0 13.8         Left Radial Anti Sensory (Base 1st Digit)  31.6C  Wrist    2.1 <3.1 22.8  Wrist Base 1st Digit 2.1 0.0    Right Radial Anti Sensory (Base 1st Digit)  36.6C  Wrist    2.3 <3.1 16.4  Wrist Base 1st Digit 2.3 0.0    Left Ulnar Anti Sensory (5th Digit)  32.4C  Wrist    3.5 <3.7 19.5 >15.0 Wrist 5th Digit 3.5 14.0 40 >38  Right Ulnar Anti Sensory (5th Digit)  34.7C  Wrist    3.2 <3.7 19.0 >15.0 Wrist 5th Digit 3.2 14.0 44 >38   Motor Summary Table   Stim Site NR Onset (ms) Norm Onset (ms) O-P Amp (mV) Norm O-P Amp Site1 Site2 Delta-0 (ms) Dist (cm) Vel (m/s) Norm Vel (m/s)  Left Median Motor (Abd Poll Brev)  31.9C  Wrist    4.1 <4.2 6.7 >5 Elbow Wrist 3.6 18.0 50 >50  Elbow    7.7  6.6         Right Median Motor (Abd Poll Brev)  35.9C  Wrist    *7.1 <4.2 *4.7 >5 Elbow Wrist 4.2 19.5 *46 >50  Elbow    11.3  4.6         Left Ulnar Motor (Abd Dig Min)  32C  Wrist  2.8 <4.2 9.0 >3 B Elbow Wrist 3.0 17.0 57 >53  B Elbow    5.8  9.0  A Elbow B Elbow 1.2 9.5 79 >53  A Elbow    7.0  8.8         Right Ulnar Motor (Abd Dig Min)  35.6C  Wrist    2.7 <4.2 6.7 >3 B Elbow Wrist 3.0 17.5 58 >53  B Elbow    5.7  6.8  A Elbow B Elbow 1.2 9.0 75 >53  A Elbow    6.9  6.7          EMG   Side Muscle Nerve Root Ins Act Fibs Psw Amp Dur Poly Recrt Int Fraser Din Comment  Right Abd Poll Brev Median C8-T1 Nml Nml Nml Nml Nml 0 Nml Nml   Right 1stDorInt Ulnar C8-T1 Nml Nml Nml Nml Nml 0 Nml Nml   Right PronatorTeres Median C6-7 Nml Nml Nml Nml Nml 0 Nml Nml   Right Biceps Musculocut C5-6 Nml Nml Nml Nml Nml 0 Nml Nml   Right Deltoid Axillary C5-6 Nml Nml Nml Nml Nml 0 Nml Nml     Nerve Conduction Studies Anti Sensory Left/Right Comparison   Stim Site L Lat (ms) R Lat (ms) L-R Lat (ms) L Amp (V) R Amp (V) L-R Amp (%) Site1 Site2 L Vel (m/s) R Vel (m/s) L-R Vel (m/s)    Median Acr Palm Anti Sensory (2nd Digit)  32.5C  Wrist *4.5   20.5   Wrist Palm     Palm *2.1 *4.9 2.8 7.0 13.8 49.3       Radial Anti Sensory (Base 1st Digit)  31.6C  Wrist 2.1 2.3 0.2 22.8 16.4 28.1 Wrist Base 1st Digit     Ulnar Anti Sensory (5th Digit)  32.4C  Wrist 3.5 3.2 0.3 19.5 19.0 2.6 Wrist 5th Digit 40 44 4   Motor Left/Right Comparison   Stim Site L Lat (ms) R Lat (ms) L-R Lat (ms) L Amp (mV) R Amp (mV) L-R Amp (%) Site1 Site2 L Vel (m/s) R Vel (m/s) L-R Vel (m/s)  Median Motor (Abd Poll Brev)  31.9C  Wrist 4.1 *7.1 *3.0 6.7 *4.7 29.9 Elbow Wrist 50 *46 4  Elbow 7.7 11.3 3.6 6.6 4.6 30.3       Ulnar Motor (Abd Dig Min)  32C  Wrist 2.8 2.7 0.1 9.0 6.7 *25.6 B Elbow Wrist 57 58 1  B Elbow 5.8 5.7 0.1 9.0 6.8 24.4 A Elbow B Elbow 79 75 4  A Elbow 7.0 6.9 0.1 8.8 6.7 23.9          Waveforms:

## 2017-05-27 NOTE — Progress Notes (Signed)
Rhonda Brewer - 67 y.o. female MRN [id number]  Date of birth: 13-Mar-1951  Office Visit Note: Visit Date: 05/23/2017 PCP: [name], MD Referred by: [name], MD  Subjective: Chief Complaint  Patient presents with  . Left Hand - Brewer, Numbness, Tingling  . Right Hand - Brewer, Numbness, Tingling   HPI: Rhonda Brewer is a 67 year old right-hand-dominant female who comes in today at the request of Dr. Estanislado Pandy for electrodiagnostic studies of both upper limbs.  She reports approximately 1 year of worsening Brewer numbness and tingling in both the right and left hands.  The right hand in particular is her thumb and index and middle fingers worse than the others.  She reports nocturnal complaints as well as worsening with position.  She really talks about not being able to write for any length of time Rhonda Brewer and numbness.  Left hand is similar in terms of the pattern being more of the radial digits.  The left hand is much less than the right.  She does report that she has a hard time picking up objects with her left hand.  She has had no prior electrodiagnostic studies.  She does carry a diagnosis of scleroderma.    ROS Otherwise per HPI.  Assessment & Plan: Visit Diagnoses:  1. Paresthesia of skin     Plan: No additional findings.  Impression: The above electrodiagnostic study is ABNORMAL and reveals evidence of:  1.  A moderate to severe right median nerve entrapment at the wrist (carpal tunnel syndrome) affecting sensory and motor components.   2.  A mild left median nerve entrapment at the wrist (carpal tunnel syndrome) affecting sensory components.   There is no significant electrodiagnostic evidence of any other focal nerve entrapment, brachial plexopathy or cervical radiculopathy.   Recommendations: 1.  Follow-up with referring physician. 2.  Continue current management of symptoms. 3.  Continue use of resting splint at night-time and as needed during the  day. 4.  Suggest surgical evaluation.   Meds & Orders: No orders of the defined types were placed in this encounter.   Orders Placed This Encounter  Procedures  . NCV with EMG (electromyography)    Follow-up: Return for Dr. Estanislado Pandy.   Procedures: No procedures performed  EMG & NCV Findings: Evaluation of the right median motor nerve showed prolonged distal onset latency (7.1 ms), reduced amplitude (4.7 mV), and decreased conduction velocity (Elbow-Wrist, 46 m/s).  The left median (across palm) sensory nerve showed prolonged distal peak latency (Wrist, 4.5 ms) and prolonged distal peak latency (Palm, 2.1 ms).  The right median (across palm) sensory nerve showed no response (Wrist) and prolonged distal peak latency (Palm, 4.9 ms).  All remaining nerves (as indicated in the following tables) were within normal limits.  Left vs. Right side comparison data for the median motor nerve indicates abnormal L-R latency difference (3.0 ms).  The ulnar motor nerve indicates abnormal L-R amplitude difference (25.6 %).  All remaining left vs. right side differences were within normal limits.    All examined muscles (as indicated in the following table) showed no evidence of electrical instability.    Impression: The above electrodiagnostic study is ABNORMAL and reveals evidence of:  1.  A moderate to severe right median nerve entrapment at the wrist (carpal tunnel syndrome) affecting sensory and motor components.   2.  A mild left median nerve entrapment at the wrist (carpal tunnel syndrome) affecting sensory components.   There is no significant electrodiagnostic evidence  of any other focal nerve entrapment, brachial plexopathy or cervical radiculopathy.   Recommendations: 1.  Follow-up with referring physician. 2.  Continue current management of symptoms. 3.  Continue use of resting splint at night-time and as needed during the day. 4.  Suggest surgical evaluation.  Nerve Conduction  Studies Anti Sensory Summary Table   Stim Site NR Peak (ms) Norm Peak (ms) P-T Amp (V) Norm P-T Amp Site1 Site2 Delta-P (ms) Dist (cm) Vel (m/s) Norm Vel (m/s)  Left Median Acr Palm Anti Sensory (2nd Digit)  32.5C  Wrist    *4.5 <3.6 20.5 >10 Wrist Palm 2.4 0.0    Palm    *2.1 <2.0 7.0         Right Median Acr Palm Anti Sensory (2nd Digit)  34C  Wrist *NR  <3.6  >10 Wrist Palm  0.0    Palm    *4.9 <2.0 13.8         Left Radial Anti Sensory (Base 1st Digit)  31.6C  Wrist    2.1 <3.1 22.8  Wrist Base 1st Digit 2.1 0.0    Right Radial Anti Sensory (Base 1st Digit)  36.6C  Wrist    2.3 <3.1 16.4  Wrist Base 1st Digit 2.3 0.0    Left Ulnar Anti Sensory (5th Digit)  32.4C  Wrist    3.5 <3.7 19.5 >15.0 Wrist 5th Digit 3.5 14.0 40 >38  Right Ulnar Anti Sensory (5th Digit)  34.7C  Wrist    3.2 <3.7 19.0 >15.0 Wrist 5th Digit 3.2 14.0 44 >38   Motor Summary Table   Stim Site NR Onset (ms) Norm Onset (ms) O-P Amp (mV) Norm O-P Amp Site1 Site2 Delta-0 (ms) Dist (cm) Vel (m/s) Norm Vel (m/s)  Left Median Motor (Abd Poll Brev)  31.9C  Wrist    4.1 <4.2 6.7 >5 Elbow Wrist 3.6 18.0 50 >50  Elbow    7.7  6.6         Right Median Motor (Abd Poll Brev)  35.9C  Wrist    *7.1 <4.2 *4.7 >5 Elbow Wrist 4.2 19.5 *46 >50  Elbow    11.3  4.6         Left Ulnar Motor (Abd Dig Min)  32C  Wrist    2.8 <4.2 9.0 >3 B Elbow Wrist 3.0 17.0 57 >53  B Elbow    5.8  9.0  A Elbow B Elbow 1.2 9.5 79 >53  A Elbow    7.0  8.8         Right Ulnar Motor (Abd Dig Min)  35.6C  Wrist    2.7 <4.2 6.7 >3 B Elbow Wrist 3.0 17.5 58 >53  B Elbow    5.7  6.8  A Elbow B Elbow 1.2 9.0 75 >53  A Elbow    6.9  6.7          EMG   Side Muscle Nerve Root Ins Act Fibs Psw Amp Dur Poly Recrt Int Fraser Din Comment  Right Abd Poll Brev Median C8-T1 Nml Nml Nml Nml Nml 0 Nml Nml   Right 1stDorInt Ulnar C8-T1 Nml Nml Nml Nml Nml 0 Nml Nml   Right PronatorTeres Median C6-7 Nml Nml Nml Nml Nml 0 Nml Nml   Right Biceps Musculocut C5-6  Nml Nml Nml Nml Nml 0 Nml Nml   Right Deltoid Axillary C5-6 Nml Nml Nml Nml Nml 0 Nml Nml     Nerve Conduction Studies Anti Sensory Left/Right Comparison  Stim Site L Lat (ms) R Lat (ms) L-R Lat (ms) L Amp (V) R Amp (V) L-R Amp (%) Site1 Site2 L Vel (m/s) R Vel (m/s) L-R Vel (m/s)  Median Acr Palm Anti Sensory (2nd Digit)  32.5C  Wrist *4.5   20.5   Wrist Palm     Palm *2.1 *4.9 2.8 7.0 13.8 49.3       Radial Anti Sensory (Base 1st Digit)  31.6C  Wrist 2.1 2.3 0.2 22.8 16.4 28.1 Wrist Base 1st Digit     Ulnar Anti Sensory (5th Digit)  32.4C  Wrist 3.5 3.2 0.3 19.5 19.0 2.6 Wrist 5th Digit 40 44 4   Motor Left/Right Comparison   Stim Site L Lat (ms) R Lat (ms) L-R Lat (ms) L Amp (mV) R Amp (mV) L-R Amp (%) Site1 Site2 L Vel (m/s) R Vel (m/s) L-R Vel (m/s)  Median Motor (Abd Poll Brev)  31.9C  Wrist 4.1 *7.1 *3.0 6.7 *4.7 29.9 Elbow Wrist 50 *46 4  Elbow 7.7 11.3 3.6 6.6 4.6 30.3       Ulnar Motor (Abd Dig Min)  32C  Wrist 2.8 2.7 0.1 9.0 6.7 *25.6 B Elbow Wrist 57 58 1  B Elbow 5.8 5.7 0.1 9.0 6.8 24.4 A Elbow B Elbow 79 75 4  A Elbow 7.0 6.9 0.1 8.8 6.7 23.9          Waveforms:                     Clinical History: No specialty comments available.   She reports that  has never smoked. she has never used smokeless tobacco. No results for input(s): HGBA1C, LABURIC in the last 8760 hours.  Objective:  VS:  HT:    WT:   BMI:     BP:   HR: bpm  TEMP: ( )  RESP:  Physical Exam  Musculoskeletal:  Inspection reveals mild flattening of the right APB but no atrophy of the bilateral APB or FDI or hand intrinsics. There is no swelling, color changes, allodynia or dystrophic changes. There is 5 out of 5 strength in the bilateral wrist extension, finger abduction and long finger flexion.  There is decreased sensation in the median nerve distribution on the right compared to left. There is a negative Hoffmann's test bilaterally.    Ortho Exam Imaging: No results  found.  Past Medical/Family/Surgical/Social History: Medications & Allergies reviewed per EMR, new medications updated. Patient Active Problem List   Diagnosis Date Noted  . Chronic low back Brewer 04/29/2017  . Screening for breast cancer 09/04/2016  . Pes anserinus bursitis of left knee 09/04/2016  . Dyspnea and respiratory abnormalities 08/16/2016  . LUQ abdominal Brewer 06/26/2016  . H/O cardiac radiofrequency ablation 06/26/2016  . CAD (coronary artery disease), native coronary artery 06/19/2016  . History of hypothyroidism 06/19/2016  . History of uterine cancer 06/19/2016  . Scleroderma ([place]) 06/16/2016  . High risk medication use 06/16/2016  . Osteopenia of multiple sites 06/16/2016   Past Medical History:  Diagnosis Date  . Allergy   . Arthritis   . CAD (coronary artery disease)    a. DES to RCA 11/2015  . Cancer [facility])    Uterine 2008  . Heart murmur   . Hyperlipidemia   . Osteoporosis   . Thyroid disease    Family History  Problem Relation Age of Onset  . Asthma Maternal Aunt   . Heart disease Mother   . Diabetes Mother   .  Heart disease Father   . Stomach cancer Brother   . Colon cancer Neg Hx   . Esophageal cancer Neg Hx   . Rectal cancer Neg Hx    Past Surgical History:  Procedure Laterality Date  . ABDOMINAL HYSTERECTOMY    . APPENDECTOMY    . CARDIAC ELECTROPHYSIOLOGY MAPPING AND ABLATION  04/19/2003  . CORONARY ANGIOPLASTY WITH STENT PLACEMENT     Social History   Occupational History  . Not on file  Tobacco Use  . Smoking status: Never Smoker  . Smokeless tobacco: Never Used  Substance and Sexual Activity  . Alcohol use: No  . Drug use: No  . Sexual activity: Not on file    Comment: hysterectomy

## 2017-05-28 ENCOUNTER — Encounter: Payer: Self-pay | Admitting: Family Medicine

## 2017-05-28 DIAGNOSIS — G5600 Carpal tunnel syndrome, unspecified upper limb: Secondary | ICD-10-CM

## 2017-05-28 DIAGNOSIS — G8929 Other chronic pain: Secondary | ICD-10-CM | POA: Diagnosis not present

## 2017-05-28 DIAGNOSIS — M545 Low back pain: Secondary | ICD-10-CM | POA: Diagnosis not present

## 2017-05-28 NOTE — Progress Notes (Signed)
Office Visit Note  Patient: Rhonda Brewer             Date of Birth: October 16, 1950           MRN: [id number]             PCP: [name], MD Referring: [name], MD Visit Date: 05/29/2017 Occupation: @GUAROCC @    Subjective:  Other (trigger finger )   History of Present Illness: Rhonda Brewer is a 67 y.o. female with history of a scleroderma.  She states she continues to have some problems with rainouts phenomenon.  She has not noticed any increasing skin tightness.  She has been having discomfort and pain in her left third finger which has been triggering.  She has also noticed decreased grip strength in her left hand.  Activities of Daily Living:  Patient reports morning stiffness for 5-10 minutes.   Patient Reports nocturnal pain.  Difficulty dressing/grooming: Denies Difficulty climbing stairs: Denies Difficulty getting out of chair: Denies Difficulty using hands for taps, buttons, cutlery, and/or writing: Denies   Review of Systems  Constitutional: Negative for fatigue, night sweats, weight gain, weight loss and weakness.  HENT: Positive for mouth dryness. Negative for mouth sores, trouble swallowing, trouble swallowing and nose dryness.   Eyes: Positive for dryness. Negative for pain, redness and visual disturbance.  Respiratory: Negative for cough, shortness of breath and difficulty breathing.   Cardiovascular: Negative for chest pain, palpitations, hypertension, irregular heartbeat and swelling in legs/feet.  Gastrointestinal: Negative for abdominal pain, blood in stool, constipation and diarrhea.  Endocrine: Negative for increased urination.  Genitourinary: Negative for pelvic pain and vaginal dryness.  Musculoskeletal: Positive for morning stiffness. Negative for arthralgias, joint pain, joint swelling, myalgias, muscle weakness, muscle tenderness and myalgias.  Skin: Negative for color change, rash, hair loss, skin tightness, ulcers and sensitivity to  sunlight.  Allergic/Immunologic: Negative for susceptible to infections.  Neurological: Negative for dizziness, headaches, memory loss and night sweats.  Hematological: Negative for bruising/bleeding tendency and swollen glands.  Psychiatric/Behavioral: Negative for depressed mood, confusion and sleep disturbance. The patient is not nervous/anxious.     PMFS History:  Patient Active Problem List   Diagnosis Date Noted  . Chronic low back pain 04/29/2017  . Screening for breast cancer 09/04/2016  . Pes anserinus bursitis of left knee 09/04/2016  . Dyspnea and respiratory abnormalities 08/16/2016  . LUQ abdominal pain 06/26/2016  . H/O cardiac radiofrequency ablation 06/26/2016  . CAD (coronary artery disease), native coronary artery 06/19/2016  . History of hypothyroidism 06/19/2016  . History of uterine cancer 06/19/2016  . Scleroderma ([place]) 06/16/2016  . High risk medication use 06/16/2016  . Osteopenia of multiple sites 06/16/2016    Past Medical History:  Diagnosis Date  . Allergy   . Arthritis   . CAD (coronary artery disease)    a. DES to RCA 11/2015  . Cancer [facility])    Uterine 2008  . Heart murmur   . Hyperlipidemia   . Osteoporosis   . Thyroid disease     Family History  Problem Relation Age of Onset  . Asthma Maternal Aunt   . Heart disease Mother   . Diabetes Mother   . Heart disease Father   . Stomach cancer Brother   . Colon cancer Neg Hx   . Esophageal cancer Neg Hx   . Rectal cancer Neg Hx    Past Surgical History:  Procedure Laterality Date  . ABDOMINAL HYSTERECTOMY    .  APPENDECTOMY    . CARDIAC ELECTROPHYSIOLOGY MAPPING AND ABLATION  04/19/2003  . CORONARY ANGIOPLASTY WITH STENT PLACEMENT     Social History   Social History Narrative  . Not on file     Objective: Vital Signs: BP 116/67 (BP Location: Left Arm, Patient Position: Sitting, Cuff Size: Normal)   Pulse 65   Resp 16   Ht 5\' 4"  (1.626 m)   Wt 137 lb (62.1 kg)   BMI 23.52 kg/m      Physical Exam  Constitutional: She is oriented to person, place, and time. She appears well-developed and well-nourished.  HENT:  Head: Normocephalic and atraumatic.  Eyes: Conjunctivae and EOM are normal.  Neck: Normal range of motion.  Cardiovascular: Normal rate, regular rhythm, normal heart sounds and intact distal pulses.  Pulmonary/Chest: Effort normal and breath sounds normal.  Abdominal: Soft. Bowel sounds are normal.  Lymphadenopathy:    She has no cervical adenopathy.  Neurological: She is alert and oriented to person, place, and time.  Skin: Skin is warm and dry. Capillary refill takes less than 2 seconds.  sclerodactyly noted on bilateral hands.  Telangiectasias noted on face and bilateral hands.  Psychiatric: She has a normal mood and affect. Her behavior is normal.  Nursing note and vitals reviewed.    Musculoskeletal Exam: C-spine thoracic lumbar spine good range of motion.  Shoulder joints elbow joints wrist joints are good range of motion.  Fist formation is about 80% due to sclerodactyly.  No synovitis was noted.  Hip joints knee joints ankles MTPs PIPs with good range of motion.  CDAI Exam: No CDAI exam completed.    Investigation: No additional findings.PLQ eye exam: 07/22/2016  CBC Latest Ref Rng & Units 05/01/2017 01/06/2017 06/19/2016  WBC 3.8 - 10.8 Thousand/uL 4.7 6.3 5.0  Hemoglobin 11.7 - 15.5 g/dL 14.5 14.8 14.8  Hematocrit 35.0 - 45.0 % 42.1 43.8 44.9  Platelets 140 - 400 Thousand/uL 173 129(L) 88(L)   CMP Latest Ref Rng & Units 05/01/2017 01/06/2017 06/19/2016  Glucose 65 - 99 mg/dL 94 82 82  BUN 7 - 25 mg/dL 16 14 14   Creatinine 0.50 - 0.99 mg/dL 0.82 0.81 0.79  Sodium 135 - 146 mmol/L 140 139 139  Potassium 3.5 - 5.3 mmol/L 4.4 4.0 4.2  Chloride 98 - 110 mmol/L 106 102 104  CO2 20 - 32 mmol/L 25 28 24   Calcium 8.6 - 10.4 mg/dL 9.6 9.5 9.5  Total Protein 6.1 - 8.1 g/dL 7.0 6.8 6.8  Total Bilirubin 0.2 - 1.2 mg/dL 0.5 0.6 0.7  Alkaline Phos 33 -  130 U/L - - 64  AST 10 - 35 U/L 29 23 27   ALT 6 - 29 U/L 31(H) 20 24    Imaging: No results found.  Speciality Comments: PLQ Eye Exam: 07/22/16 WNL @ [facility]    Procedures:  Hand/UE Inj: L long A1 for trigger finger on 05/29/2017 12:00 PM Indications: pain, tendon swelling and therapeutic Details: 27 G needle, ultrasound-guided volar approach Medications: 0.5 mL lidocaine 1 %; 10 mg triamcinolone acetonide 40 MG/ML Aspirate: 0 mL; sent for lab analysis Outcome: tolerated well, no immediate complications Procedure, treatment alternatives, risks and benefits explained, specific risks discussed. Consent was given by the patient. Immediately prior to procedure a time out was called to verify the correct patient, procedure, equipment, support staff and site/side marked as required. Patient was prepped and draped in the usual sterile fashion.     Allergies: Avelox [moxifloxacin hcl in nacl]  and Other   Assessment / Plan:     Visit Diagnoses: Scleroderma ([place]) - Limited systemic with Raynauds, Telengectesia's, sclerodactyly, arthralgias, erosions in right fifth and left third DIP, ANA centromere.  Her symptoms did appears to be stable without any worsening of sclerodactyly or rainouts phenomenon.  No digital ulcers were noted.  High risk medication use - Hydroxychloroquine 200 mg twice a day Monday through Fridayeye exam: 07/22/2016.  Her next eye exam is due in May 2019.  Raynaud's phenomenon without gangrene: Her rainouts is a stable with keeping warm clothing.  Trigger middle finger of left hand :: Different treatment options were discussed.  Left third trigger finger was injected with cortisone under ultrasound guidance the procedure as described above.  Thrombocytopenia ([place]): Her platelets were better on the last lab work.    Osteopenia of multiple sites - DEXA 6/2018t score -1.6 left femur. She is on calcium and vitamin D.  She has been exercising on a regular basis.  History  of hypothyroidism  History of uterine cancer - 2008, status post hysterectomy, chemotherapy, radiation therapy    History of coronary artery disease - status post stent     Orders: Orders Placed This Encounter  Procedures  . Hand/UE Inj: L long A1  . Urinalysis, Routine w reflex microscopic   No orders of the defined types were placed in this encounter.   Face-to-face time spent with patient was 30 minutes. Greater 50% of time was spent in counseling and coordination of care.  Follow-Up Instructions: Return in about 6 months (around 11/29/2017) for Scleroderma, raynauds.   Bo Merino, MD  Note - This record has been created using Editor, commissioning.  Chart creation errors have been sought, but may not always  have been located. Such creation errors do not reflect on  the standard of medical care.

## 2017-05-29 ENCOUNTER — Telehealth: Payer: Self-pay | Admitting: *Deleted

## 2017-05-29 ENCOUNTER — Encounter: Payer: Self-pay | Admitting: Rheumatology

## 2017-05-29 ENCOUNTER — Ambulatory Visit (INDEPENDENT_AMBULATORY_CARE_PROVIDER_SITE_OTHER): Payer: Medicare Other | Admitting: Rheumatology

## 2017-05-29 VITALS — BP 116/67 | HR 65 | Resp 16 | Ht 64.0 in | Wt 137.0 lb

## 2017-05-29 DIAGNOSIS — D696 Thrombocytopenia, unspecified: Secondary | ICD-10-CM

## 2017-05-29 DIAGNOSIS — Z79899 Other long term (current) drug therapy: Secondary | ICD-10-CM | POA: Diagnosis not present

## 2017-05-29 DIAGNOSIS — I7300 Raynaud's syndrome without gangrene: Secondary | ICD-10-CM | POA: Diagnosis not present

## 2017-05-29 DIAGNOSIS — Z8679 Personal history of other diseases of the circulatory system: Secondary | ICD-10-CM | POA: Diagnosis not present

## 2017-05-29 DIAGNOSIS — Z8639 Personal history of other endocrine, nutritional and metabolic disease: Secondary | ICD-10-CM

## 2017-05-29 DIAGNOSIS — M65332 Trigger finger, left middle finger: Secondary | ICD-10-CM

## 2017-05-29 DIAGNOSIS — M8589 Other specified disorders of bone density and structure, multiple sites: Secondary | ICD-10-CM | POA: Diagnosis not present

## 2017-05-29 DIAGNOSIS — Z8542 Personal history of malignant neoplasm of other parts of uterus: Secondary | ICD-10-CM

## 2017-05-29 DIAGNOSIS — G5600 Carpal tunnel syndrome, unspecified upper limb: Secondary | ICD-10-CM | POA: Insufficient documentation

## 2017-05-29 DIAGNOSIS — M349 Systemic sclerosis, unspecified: Secondary | ICD-10-CM | POA: Diagnosis not present

## 2017-05-29 MED ORDER — LIDOCAINE HCL 1 % IJ SOLN
0.5000 mL | INTRAMUSCULAR | Status: AC | PRN
  Administered 2017-05-29: .5 mL

## 2017-05-29 MED ORDER — TRIAMCINOLONE ACETONIDE 40 MG/ML IJ SUSP
10.0000 mg | INTRAMUSCULAR | Status: AC | PRN
  Administered 2017-05-29: 10 mg

## 2017-05-29 NOTE — Telephone Encounter (Signed)
Attempted to contact the patient and left message for patient to call the office.  

## 2017-05-29 NOTE — Patient Instructions (Signed)
CBC, CMP and UA in 6 months.

## 2017-05-29 NOTE — Telephone Encounter (Signed)
Patient states she has a friend who has a recommendation on a surgeon for the [place]. Patient states she will call the office with the name of the surgeon when she has it.

## 2017-05-29 NOTE — Telephone Encounter (Signed)
-----   Message from Magnus Sinning, MD sent at 05/27/2017  8:19 AM EDT ----- Please see note the patient has pretty significant carpal tunnel issues on the right with mild on the left

## 2017-06-04 DIAGNOSIS — M545 Low back pain: Secondary | ICD-10-CM | POA: Diagnosis not present

## 2017-06-04 DIAGNOSIS — G8929 Other chronic pain: Secondary | ICD-10-CM | POA: Diagnosis not present

## 2017-06-18 DIAGNOSIS — G8929 Other chronic pain: Secondary | ICD-10-CM | POA: Diagnosis not present

## 2017-06-18 DIAGNOSIS — M545 Low back pain: Secondary | ICD-10-CM | POA: Diagnosis not present

## 2017-06-23 DIAGNOSIS — G8929 Other chronic pain: Secondary | ICD-10-CM | POA: Diagnosis not present

## 2017-06-23 DIAGNOSIS — M545 Low back pain: Secondary | ICD-10-CM | POA: Diagnosis not present

## 2017-06-25 ENCOUNTER — Other Ambulatory Visit: Payer: Self-pay | Admitting: Rheumatology

## 2017-06-25 DIAGNOSIS — M545 Low back pain: Secondary | ICD-10-CM | POA: Diagnosis not present

## 2017-06-25 DIAGNOSIS — G8929 Other chronic pain: Secondary | ICD-10-CM | POA: Diagnosis not present

## 2017-06-25 MED ORDER — HYDROXYCHLOROQUINE SULFATE 200 MG PO TABS: 200.0000 mg | ORAL_TABLET | Freq: Two times a day (BID) | ORAL | 0 refills | Status: DC

## 2017-06-25 NOTE — Telephone Encounter (Signed)
Last visit: 05/29/17 Next visit: 12/04/17 Labs:05/01/17 ALT very mildly elevated. We will continue to monitor. All other labs are WNL PLQ Eye Exam:07/22/2016   Okay to refill per Dr. Estanislado Pandy

## 2017-06-30 DIAGNOSIS — G8929 Other chronic pain: Secondary | ICD-10-CM | POA: Diagnosis not present

## 2017-06-30 DIAGNOSIS — M545 Low back pain: Secondary | ICD-10-CM | POA: Diagnosis not present

## 2017-07-03 DIAGNOSIS — G8929 Other chronic pain: Secondary | ICD-10-CM | POA: Diagnosis not present

## 2017-07-03 DIAGNOSIS — M545 Low back pain: Secondary | ICD-10-CM | POA: Diagnosis not present

## 2017-07-07 DIAGNOSIS — M545 Low back pain: Secondary | ICD-10-CM | POA: Diagnosis not present

## 2017-07-07 DIAGNOSIS — G8929 Other chronic pain: Secondary | ICD-10-CM | POA: Diagnosis not present

## 2017-07-10 ENCOUNTER — Ambulatory Visit: Payer: Medicare Other | Admitting: Rheumatology

## 2017-07-10 ENCOUNTER — Encounter: Payer: Self-pay | Admitting: Family Medicine

## 2017-07-15 ENCOUNTER — Telehealth: Payer: Self-pay | Admitting: Rheumatology

## 2017-07-15 NOTE — Telephone Encounter (Signed)
error 

## 2017-07-29 IMAGING — CT CT CHEST HIGH RESOLUTION W/O CM
3 of 8 series · 15 of 36 positions shown, 18 images · non-contrast
Comparison: None.

CLINICAL DATA: Scleroderma. Worsening dyspnea with exertion for 2
months. Nonsmoker.

EXAM:
CT CHEST WITHOUT CONTRAST
TECHNIQUE: Multidetector CT imaging of the chest was performed following the
standard protocol without intravenous contrast. High resolution
imaging of the lungs, as well as inspiratory and expiratory imaging,
was performed.

[Series 7: chest w/o 2.0 i31s 3 · axial · non-contrast · 0.56mm/px · z∈[+912,+1114]mm · 10 of 125 slices shown, 13 images]
[im 12/125  mediastinal]
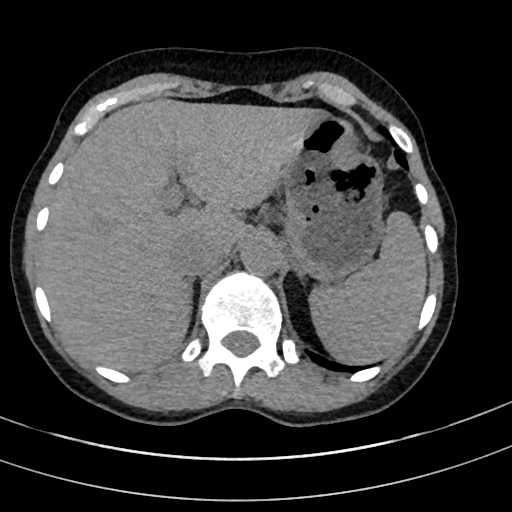
[im 12/125  lung]
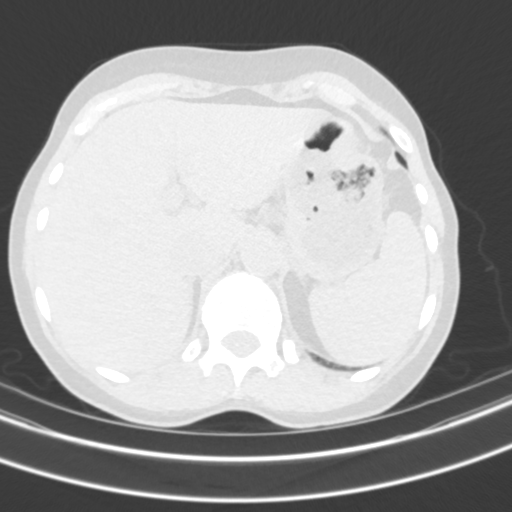
[im 23/125  lung]
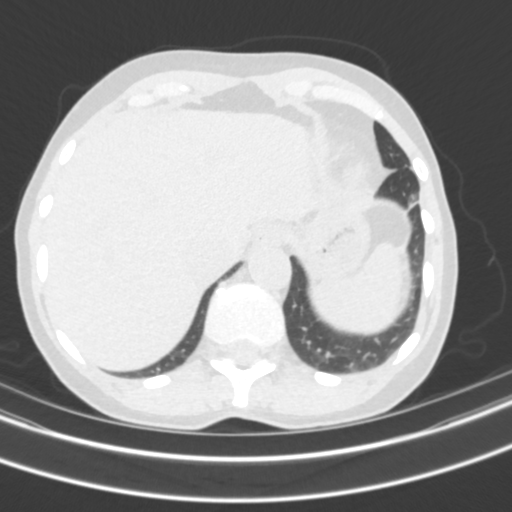
[im 34/125  lung]
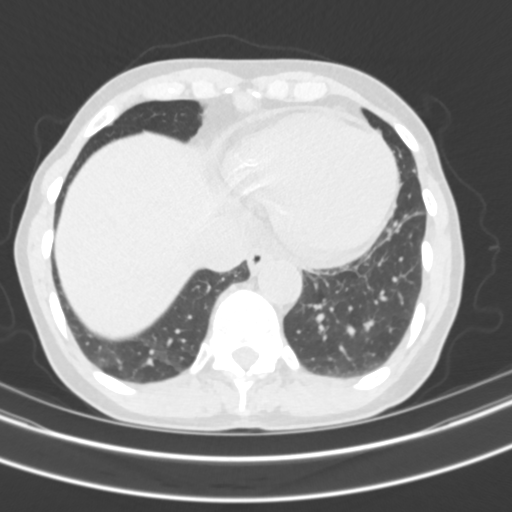
[im 46/125  lung]
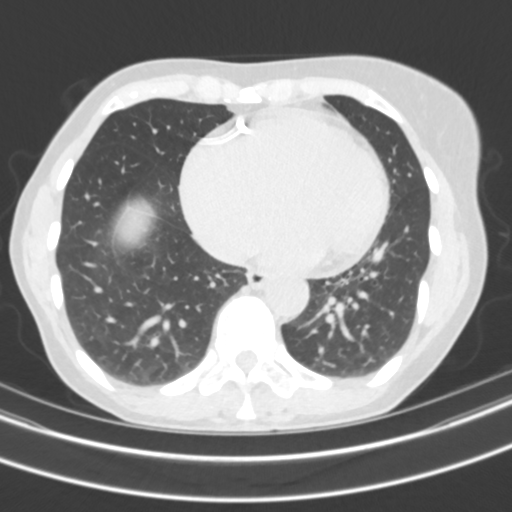
[im 57/125  mediastinal]
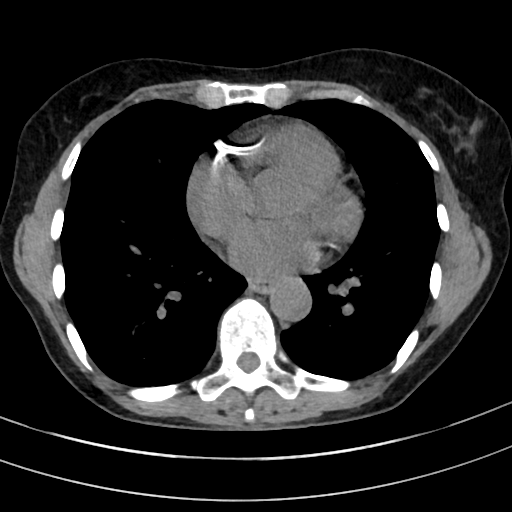
[im 57/125  lung]
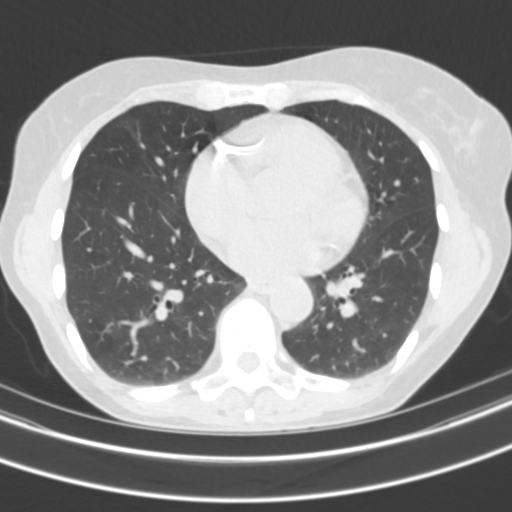
[im 68/125  lung]
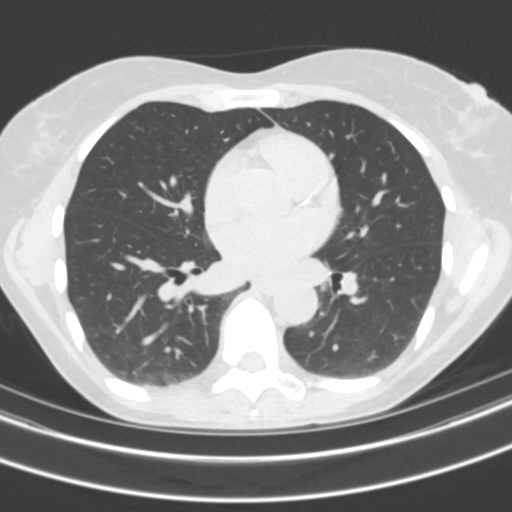
[im 79/125  lung]
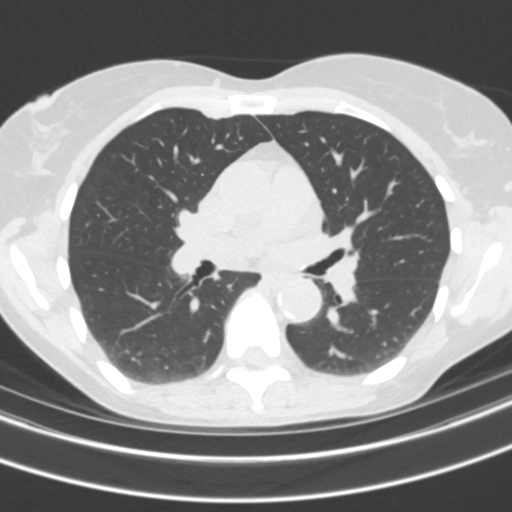
[im 91/125  lung]
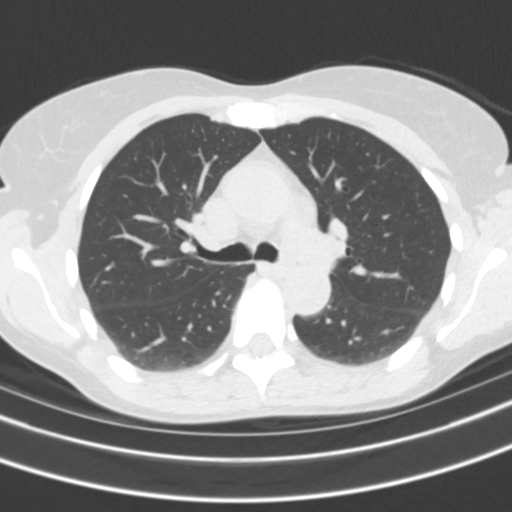
[im 102/125  mediastinal]
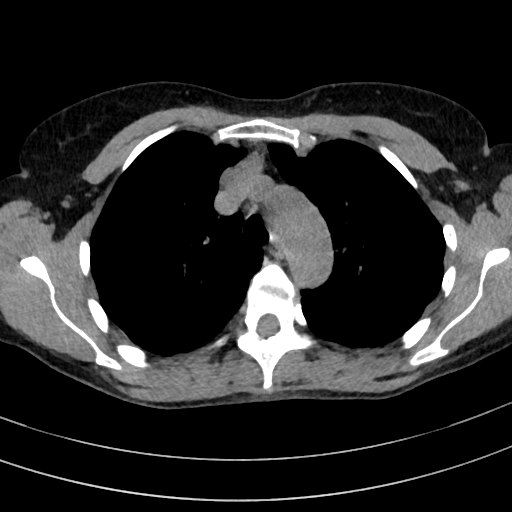
[im 102/125  lung]
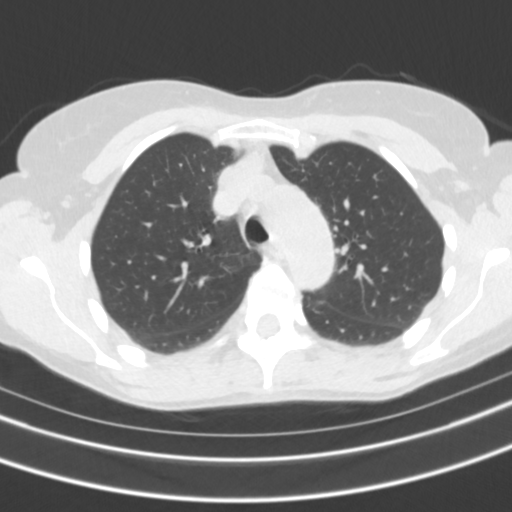
[im 113/125  lung]
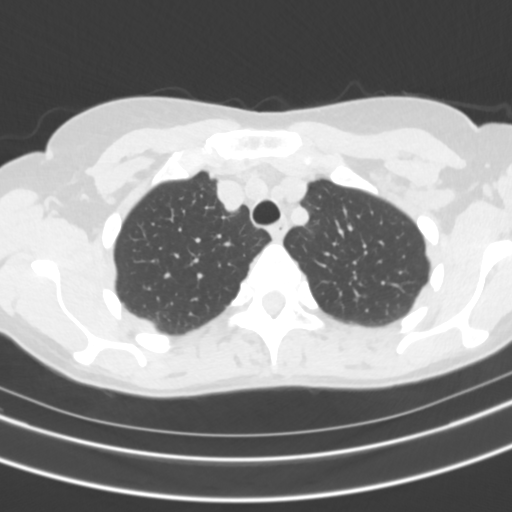

[Series 9: chest w/o 3.0 mpr cor · coronal · non-contrast · 0.57mm/px · 3 of 95 slices shown]
[im 19/95  lung]
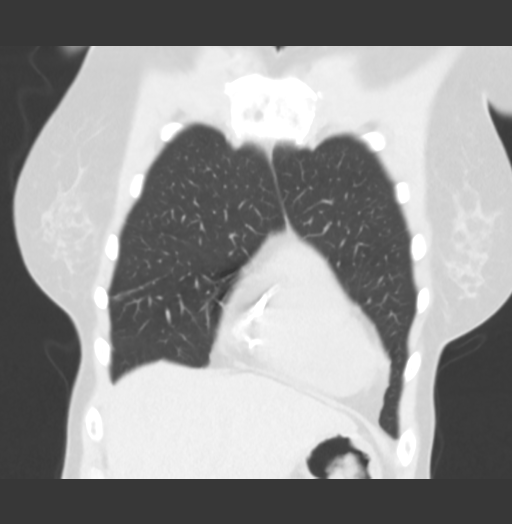
[im 38/95  lung]
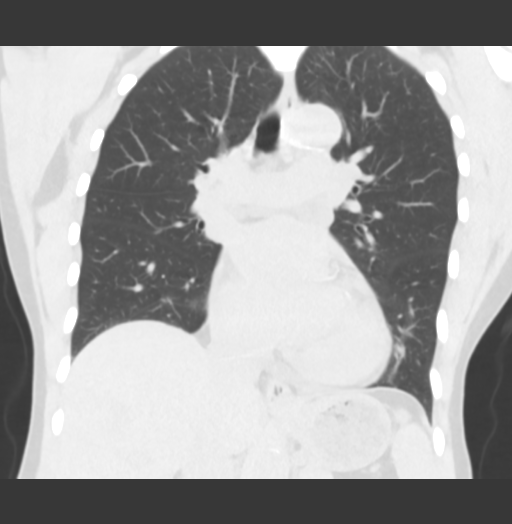
[im 57/95  lung]
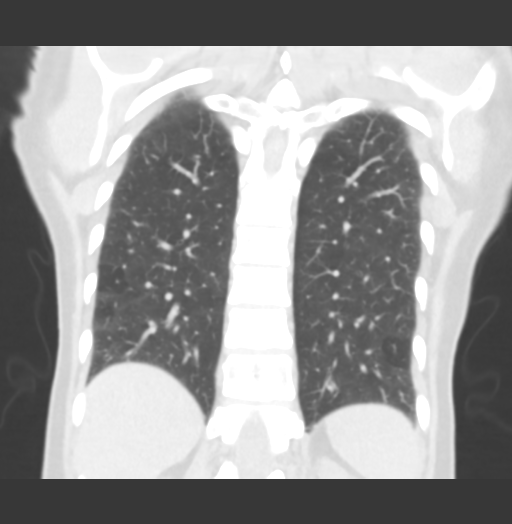

[Series 13: chest w/o 1.0 i70s 3 · axial · non-contrast · 0.48mm/px · z∈[+1126,+1138]mm · 2 of 62 slices shown]
[im 13/62  lung]
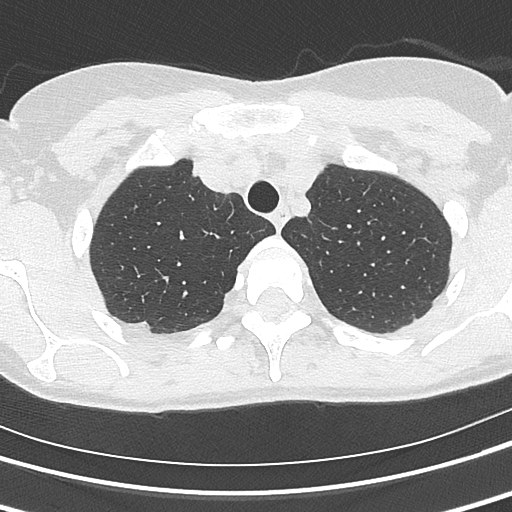
[im 25/62  lung]
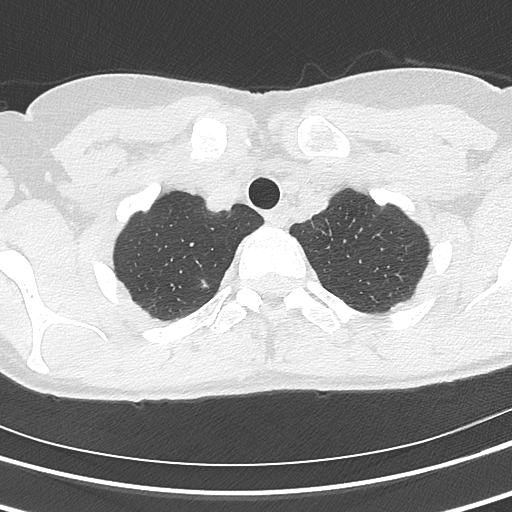

[15 of 36 positions shown; findings below may reference images not displayed]

FINDINGS: Cardiovascular: Top-normal heart size. Small pericardial
effusion/thickening. Left anterior descending, left circumflex and
right coronary atherosclerosis. Atherosclerotic nonaneurysmal
thoracic aorta. Normal caliber pulmonary arteries.

Mediastinum/Nodes: No discrete thyroid nodules. Unremarkable
esophagus. No pathologically enlarged axillary, mediastinal or gross
hilar lymph nodes, noting limited sensitivity for the detection of
hilar adenopathy on this noncontrast study.

Lungs/Pleura: No pneumothorax. No pleural effusion. Apical right
upper lobe 3 mm solid pulmonary nodule (series 6/image 14). No acute
consolidative airspace disease, lung masses or additional
significant pulmonary nodules. No significant air trapping on the
limited expiration sequence. No significant regions of subpleural
reticulation, ground-glass attenuation, traction bronchiectasis,
parenchymal banding, architectural distortion or frank honeycombing.
Minimal reticulation and ground-glass attenuation in the dependent
lungs on the routine sequence are not apparent on the inspiration
sequence, compatible with hypoventilatory changes.

Upper abdomen: Unremarkable.

Musculoskeletal: No aggressive appearing focal osseous lesions. Mild
thoracic spondylosis.
IMPRESSION: 1. No evidence of interstitial lung disease. No acute pulmonary
disease .
2. Solitary 3 mm solid apical right upper lobe pulmonary nodule. No
follow-up needed if patient is low-risk. Non-contrast chest CT can
be considered in 12 months if patient is high-risk. This
recommendation follows the consensus statement: Guidelines for
Management of Incidental Pulmonary Nodules Detected on CT Images:
3. Small pericardial effusion/thickening.
4. Three-vessel coronary atherosclerosis.

Aortic Atherosclerosis (MFIRV-X3O.O).

## 2017-08-07 ENCOUNTER — Telehealth: Payer: Self-pay | Admitting: Rheumatology

## 2017-08-07 ENCOUNTER — Telehealth (HOSPITAL_COMMUNITY): Payer: Self-pay

## 2017-08-07 DIAGNOSIS — H2513 Age-related nuclear cataract, bilateral: Secondary | ICD-10-CM | POA: Diagnosis not present

## 2017-08-07 DIAGNOSIS — M0600 Rheumatoid arthritis without rheumatoid factor, unspecified site: Secondary | ICD-10-CM | POA: Diagnosis not present

## 2017-08-07 DIAGNOSIS — Z79899 Other long term (current) drug therapy: Secondary | ICD-10-CM | POA: Diagnosis not present

## 2017-08-07 DIAGNOSIS — H43823 Vitreomacular adhesion, bilateral: Secondary | ICD-10-CM | POA: Diagnosis not present

## 2017-08-07 NOTE — Telephone Encounter (Signed)
Patient left VM on CHF clinic triage line asking for appt with Dr. Haroldine Laws due to recently feeling more general fatigue and weakness and more fainty with exercise. Attempted to return call back to schedule, no answer, left return VM to return our call to schedule her.  Renee Pain, RN

## 2017-08-07 NOTE — Telephone Encounter (Signed)
Patient called stating that she saw Dr. Katy Fitch, eye doctor today and he told her to stop taking the Plaquenil because it is affecting her eyes.  Patient states Dr. Katy Fitch told her he would contact Dr. Estanislado Pandy to discuss findings.  Patient scheduled an appointment with Dr. Estanislado Pandy on 08/12/17.

## 2017-08-07 NOTE — Progress Notes (Signed)
Office Visit Note  Patient: Rhonda Brewer             Date of Birth: 11-Jul-1950           MRN: [id number]             PCP: [name], MD Referring: [name], MD Visit Date: 08/12/2017 Occupation: @GUAROCC @    Subjective:  Medication management.   History of Present Illness: Rhonda Brewer is a 67 y.o. female history of the scleroderma.  She states recently there was some change in the vision on her eye examination and she was advised to come off the Plaquenil.  Currently she is not having any problems with rainouts phenomenon.  There is no change in the skin tightness.  Activities of Daily Living:  Patient reports morning stiffness for 5 minutes.   Patient Denies nocturnal pain.  Difficulty dressing/grooming: Denies Difficulty climbing stairs: Denies Difficulty getting out of chair: Denies Difficulty using hands for taps, buttons, cutlery, and/or writing: Denies   Review of Systems  Constitutional: Negative for activity change.  HENT: Negative for mouth dryness.   Eyes: Positive for dryness.  Respiratory: Negative for shortness of breath and difficulty breathing.   Cardiovascular: Positive for chest pain.       Patient has appointment with the cardiologist.  Gastrointestinal: Negative for constipation.  Endocrine: Negative for excessive thirst.  Genitourinary: Negative for difficulty urinating.  Musculoskeletal: Positive for morning stiffness.  Skin: Positive for color change and skin tightness. Negative for rash.  Neurological: Negative for weakness.  Hematological: Negative for bruising/bleeding tendency.  Psychiatric/Behavioral: Negative for sleep disturbance.    PMFS History:  Patient Active Problem List   Diagnosis Date Noted  . Carpal tunnel syndrome 05/29/2017  . Chronic low back pain 04/29/2017  . Screening for breast cancer 09/04/2016  . Pes anserinus bursitis of left knee 09/04/2016  . Dyspnea and respiratory abnormalities 08/16/2016  .  LUQ abdominal pain 06/26/2016  . H/O cardiac radiofrequency ablation 06/26/2016  . CAD (coronary artery disease), native coronary artery 06/19/2016  . History of hypothyroidism 06/19/2016  . History of uterine cancer 06/19/2016  . Scleroderma ([place]) 06/16/2016  . High risk medication use 06/16/2016  . Osteopenia of multiple sites 06/16/2016    Past Medical History:  Diagnosis Date  . Allergy   . Arthritis   . CAD (coronary artery disease)    a. DES to RCA 11/2015  . Cancer [facility])    Uterine 2008  . Heart murmur   . Hyperlipidemia   . Osteoporosis   . Thyroid disease     Family History  Problem Relation Age of Onset  . Asthma Maternal Aunt   . Heart disease Mother   . Diabetes Mother   . Heart disease Father   . Stomach cancer Brother   . Colon cancer Neg Hx   . Esophageal cancer Neg Hx   . Rectal cancer Neg Hx    Past Surgical History:  Procedure Laterality Date  . ABDOMINAL HYSTERECTOMY    . APPENDECTOMY    . CARDIAC ELECTROPHYSIOLOGY MAPPING AND ABLATION  04/19/2003  . CORONARY ANGIOPLASTY WITH STENT PLACEMENT     Social History   Social History Narrative  . Not on file     Objective: Vital Signs: BP (!) 107/59 (BP Location: Left Arm, Patient Position: Sitting, Cuff Size: Normal)   Pulse 65   Resp 14   Ht 5\' 4"  (1.626 m)   Wt 137 lb (62.1 kg)  BMI 23.52 kg/m    Physical Exam  Constitutional: She is oriented to person, place, and time. She appears well-developed and well-nourished.  HENT:  Head: Normocephalic and atraumatic.  Eyes: Conjunctivae and EOM are normal.  Neck: Normal range of motion.  Cardiovascular: Normal rate, regular rhythm, normal heart sounds and intact distal pulses.  Pulmonary/Chest: Effort normal and breath sounds normal.  Abdominal: Soft. Bowel sounds are normal.  Lymphadenopathy:    She has no cervical adenopathy.  Neurological: She is alert and oriented to person, place, and time.  Skin: Skin is warm and dry. Capillary refill  takes less than 2 seconds.  Sclerodactyly noted on bilateral hands.  Psychiatric: She has a normal mood and affect. Her behavior is normal.  Nursing note and vitals reviewed.    Musculoskeletal Exam: Spine thoracic lumbar spine good range of motion.  Shoulder joints elbow joints wrist joint MCPs PIPs DIPs were in good range of motion.  She does have limitation of motion of her PIP joints due to his sclerodactyly.  She is in complete fist formation.  Hip joints knee joints ankles MTPs PIPs been good range of motion with no synovitis.  CDAI Exam: No CDAI exam completed.    Investigation: No additional findings. CBC Latest Ref Rng & Units 05/01/2017 01/06/2017 06/19/2016  WBC 3.8 - 10.8 Thousand/uL 4.7 6.3 5.0  Hemoglobin 11.7 - 15.5 g/dL 14.5 14.8 14.8  Hematocrit 35.0 - 45.0 % 42.1 43.8 44.9  Platelets 140 - 400 Thousand/uL 173 129(L) 88(L)   CMP Latest Ref Rng & Units 05/01/2017 01/06/2017 06/19/2016  Glucose 65 - 99 mg/dL 94 82 82  BUN 7 - 25 mg/dL 16 14 14   Creatinine 0.50 - 0.99 mg/dL 0.82 0.81 0.79  Sodium 135 - 146 mmol/L 140 139 139  Potassium 3.5 - 5.3 mmol/L 4.4 4.0 4.2  Chloride 98 - 110 mmol/L 106 102 104  CO2 20 - 32 mmol/L 25 28 24   Calcium 8.6 - 10.4 mg/dL 9.6 9.5 9.5  Total Protein 6.1 - 8.1 g/dL 7.0 6.8 6.8  Total Bilirubin 0.2 - 1.2 mg/dL 0.5 0.6 0.7  Alkaline Phos 33 - 130 U/L - - 64  AST 10 - 35 U/L 29 23 27   ALT 6 - 29 U/L 31(H) 20 24    Imaging: No results found.  Speciality Comments: PLQ Eye Exam: 07/22/16 WNL @ [facility]    Procedures:  No procedures performed Allergies: Avelox [moxifloxacin hcl in nacl] and Other   Assessment / Plan:     Visit Diagnoses: Scleroderma ([place]) - Limited systemic with Raynauds, Telengectesia's, sclerodactyly, arthralgias, erosions in right fifth and left third DIP, ANA centromere.  Patient continues to have some skin thickening in bilateral hands which is proximal to her wrist joint.  There has been no progression of the  skin tightness.  Patient was taking Plaquenil for raynaud's phenominon.  Due to the changes in her visual field we decided to discontinue Plaquenil.  I do not see need for any other DMARDs at this time.  I have advised patient to contact me in case she develops  joint swelling.  High risk medication use - PLQ (Dr. Katy Fitch advised patient to stop taking PLQ)  Raynaud's phenomenon without gangrene-she has been taking aspirin which has been helpful.  Thrombocytopenia ([place])  Osteopenia of multiple sites - DEXA 6/2018t score -1.6 left femur.  She has been exercising on regular basis.  Use of calcium and vitamin D was discussed.  She will need DEXA  next year.  History of uterine cancer - 2008, status post hysterectomy, chemotherapy, radiation therapy    History of hypothyroidism  History of coronary artery disease - status post stent.  Patient has appointment with Dr. Haroldine Laws next week.   Orders: No orders of the defined types were placed in this encounter.  No orders of the defined types were placed in this encounter.   Face-to-face tim Follow-Up Instructions: Return in about 6 months (around 02/12/2018).   Bo Merino, MD  Note - This record has been created using Editor, commissioning.  Chart creation errors have been sought, but may not always  have been located. Such creation errors do not reflect on  the standard of medical care.

## 2017-08-08 ENCOUNTER — Ambulatory Visit (HOSPITAL_COMMUNITY)
Admission: RE | Admit: 2017-08-08 | Discharge: 2017-08-08 | Disposition: A | Discharge status: Home / Self Care | Payer: Medicare Other | Source: Ambulatory Visit | Attending: Internal Medicine | Admitting: Internal Medicine

## 2017-08-08 ENCOUNTER — Other Ambulatory Visit (HOSPITAL_COMMUNITY): Payer: Self-pay | Admitting: *Deleted

## 2017-08-08 ENCOUNTER — Telehealth (HOSPITAL_COMMUNITY): Payer: Self-pay | Admitting: *Deleted

## 2017-08-08 ENCOUNTER — Encounter (HOSPITAL_COMMUNITY): Payer: Medicare Other

## 2017-08-08 VITALS — BP 135/68 | HR 63 | Wt 138.0 lb

## 2017-08-08 DIAGNOSIS — Z7982 Long term (current) use of aspirin: Secondary | ICD-10-CM | POA: Diagnosis not present

## 2017-08-08 DIAGNOSIS — I471 Supraventricular tachycardia: Secondary | ICD-10-CM | POA: Diagnosis not present

## 2017-08-08 DIAGNOSIS — Z800 Family history of malignant neoplasm of digestive organs: Secondary | ICD-10-CM | POA: Diagnosis not present

## 2017-08-08 DIAGNOSIS — I2510 Atherosclerotic heart disease of native coronary artery without angina pectoris: Secondary | ICD-10-CM | POA: Insufficient documentation

## 2017-08-08 DIAGNOSIS — R0789 Other chest pain: Secondary | ICD-10-CM

## 2017-08-08 DIAGNOSIS — E079 Disorder of thyroid, unspecified: Secondary | ICD-10-CM | POA: Insufficient documentation

## 2017-08-08 DIAGNOSIS — E785 Hyperlipidemia, unspecified: Secondary | ICD-10-CM | POA: Diagnosis not present

## 2017-08-08 DIAGNOSIS — I517 Cardiomegaly: Secondary | ICD-10-CM | POA: Diagnosis not present

## 2017-08-08 DIAGNOSIS — R942 Abnormal results of pulmonary function studies: Secondary | ICD-10-CM

## 2017-08-08 DIAGNOSIS — R0600 Dyspnea, unspecified: Secondary | ICD-10-CM | POA: Diagnosis not present

## 2017-08-08 DIAGNOSIS — Z79899 Other long term (current) drug therapy: Secondary | ICD-10-CM | POA: Insufficient documentation

## 2017-08-08 DIAGNOSIS — Z955 Presence of coronary angioplasty implant and graft: Secondary | ICD-10-CM | POA: Insufficient documentation

## 2017-08-08 DIAGNOSIS — Z9889 Other specified postprocedural states: Secondary | ICD-10-CM | POA: Diagnosis not present

## 2017-08-08 DIAGNOSIS — M349 Systemic sclerosis, unspecified: Secondary | ICD-10-CM | POA: Insufficient documentation

## 2017-08-08 DIAGNOSIS — Z79890 Hormone replacement therapy (postmenopausal): Secondary | ICD-10-CM | POA: Insufficient documentation

## 2017-08-08 DIAGNOSIS — R0689 Other abnormalities of breathing: Secondary | ICD-10-CM

## 2017-08-08 DIAGNOSIS — Z8249 Family history of ischemic heart disease and other diseases of the circulatory system: Secondary | ICD-10-CM | POA: Insufficient documentation

## 2017-08-08 MED ORDER — NITROGLYCERIN 0.4 MG SL SUBL: 0.4000 mg | SUBLINGUAL_TABLET | SUBLINGUAL | 3 refills | Status: DC | PRN

## 2017-08-08 NOTE — H&P (View-Only) (Signed)
PCP: Dr. Andria Frames Referring: Dr. Kirke Corin    HPI:  Rhonda Brewer is a 67 y/o woman with h/o scleroderma, CAD s/p stent 9/17 , SVT s/p ablation 2/05 referred by Dr. Patrecia Pour for screening for [facility] in setting of scleroderma.   Previously lived in [place]. 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 At time had exertional fatigue and dyspnea and arm tingling. No CP.    Cath 11/24/15 LM: mild irregs LAD: 20% mid LCX: mild irreg RCA: mRCA 99% ->Xience Alpine DES 3.5x89mm  Echo 11/22/15: LVEF 55-60% Mild AI. Normal RV. Mild TR.  Echo 08/27/16: EF 60-65%,  RV normal Mild AI. No RV strain or PAH.   PFTs 08/2016 FEV1 2.11 (88%) FVC 2.86 (91%) DLCO 63%  She was seen in the HF clinic for initial evaluation in June 2018. It was felt that her dyspnea could be due to Brilinta. She was switched to Plavix. Chest CT was ordered and showed no interstitial lung disease.   She returns today for follow up. Has been going to gym regularly. 5-6x per week. Does weights, cardio and yoga. For cardio will do Zumba and other classes. Also walks a lot. This week was doing Zumba and felt SOB and a little chest tightness. Says symptoms lingered for a while. Exercised today with yoga and felt ok but didn't push it. Some SOB when bending over. Gets occasional chest tightness which is relatively. Symptoms similar to symptoms prior to stent in 2017.    Past Medical History:  Diagnosis Date  . Allergy   . Arthritis   . CAD (coronary artery disease)    a. DES to RCA 11/2015  . Cancer [facility])    Uterine 2008  . Heart murmur   . Hyperlipidemia   . Osteoporosis   . Thyroid disease     Current Outpatient Medications  Medication Sig Dispense Refill  . aspirin EC 81 MG tablet Take by mouth.    Marland Kitchen atorvastatin (LIPITOR) 40 MG tablet Take 40 mg by mouth daily.     . cycloSPORINE (RESTASIS) 0.05 % ophthalmic emulsion Place 1  drop into both eyes 2 (two) times daily.    Marland Kitchen glucosamine-chondroitin 500-400 MG tablet Take 1 tablet by mouth daily.     . hydroxychloroquine (PLAQUENIL) 200 MG tablet Take 1 tablet (200 mg total) by mouth 2 (two) times daily. Monday through Friday 120 tablet 0  . levothyroxine (SYNTHROID) 75 MCG tablet Take 75 mcg by mouth daily before breakfast.     . levothyroxine (SYNTHROID) 75 MCG tablet Take 75 mcg by mouth daily.    . Multiple Vitamin (MULTI-VITAMINS) TABS Take 1 tablet by mouth daily.     . pantoprazole (PROTONIX) 40 MG tablet TAKE 1 TABLET(40 MG) BY MOUTH DAILY 90 tablet 3  . vitamin B-12 (CYANOCOBALAMIN) 100 MCG tablet Take 100 mcg by mouth daily.     No current facility-administered medications for this encounter.      Allergies  Allergen Reactions  . Avelox [Moxifloxacin Hcl In Nacl] Other (See Comments)    dizziness  . Other     SEASONAL     Social History   Socioeconomic History  . Marital status: Married    Spouse name: Not on file  . Number of children: 2  . Years of education: Not on file  . Highest education level: Not on file  Occupational History  .  Not on file  Social Needs  . Financial resource strain: Not on file  . Food insecurity:    Worry: Not on file    Inability: Not on file  . Transportation needs:    Medical: Not on file    Non-medical: Not on file  Tobacco Use  . Smoking status: Never Smoker  . Smokeless tobacco: Never Used  Substance and Sexual Activity  . Alcohol use: Yes    Comment: occ  . Drug use: No  . Sexual activity: Not on file    Comment: hysterectomy  Lifestyle  . Physical activity:    Days per week: Not on file    Minutes per session: Not on file  . Stress: Not on file  Relationships  . Social connections:    Talks on phone: Not on file    Gets together: Not on file    Attends religious service: Not on file    Active member of club or organization: Not on file    Attends meetings of clubs or organizations: Not on  file    Relationship status: Not on file  . Intimate partner violence:    Fear of current or ex partner: Not on file    Emotionally abused: Not on file    Physically abused: Not on file    Forced sexual activity: Not on file  Other Topics Concern  . Not on file  Social History Narrative  . Not on file    Family History  Problem Relation Age of Onset  . Asthma Maternal Aunt   . Heart disease Mother   . Diabetes Mother   . Heart disease Father   . Stomach cancer Brother   . Colon cancer Neg Hx   . Esophageal cancer Neg Hx   . Rectal cancer Neg Hx     PHYSICAL EXAM: Vitals:   08/08/17 1029  BP: 135/68  Pulse: 63  SpO2: 100%    General:  Well appearing. No resp difficulty HEENT: normal Neck: supple. no JVD. Carotids 2+ bilat; no bruits. No lymphadenopathy or thryomegaly appreciated. Cor: PMI nondisplaced. Regular rate & rhythm. No rubs, gallops or murmurs. Lungs: clear Abdomen: soft, nontender, nondistended. No hepatosplenomegaly. No bruits or masses. Good bowel sounds. Extremities: no cyanosis, clubbing, rash, edema Neuro: alert & orientedx3, cranial nerves grossly intact. moves all 4 extremities w/o difficulty. Affect pleasant   ECG: NSR 63 PRWP No ST-T wave abnormalities. Personally reviewed   ASSESSMENT & PLAN:  1. CAD s/p RCA stent in 11/2015: - Now off Plavix - Now with recurrent onset of exertional chest tightness and dyspnea. Similar to previous angina - We discussed stress test vs cath. Initially she chose stress test and echo but has called back and decided on cath. Will arrange for next week.  - Continue atorvastatin 40 mg - HR too low for beta blocker.   2. Scleroderma - DLCO mildly reduced. No R heart strain on echo. CT without ILD.  - Will repeat echo and PFTs - Perform RHC at time of coronary angio    Glori Bickers, MD  11:08 AM

## 2017-08-08 NOTE — Telephone Encounter (Signed)
Pt called this afternoon stating she decided she would rather proceed w/LHC than the echo and stress test.  OK to sch per Dr Haroldine Laws.  LHC sch for 5/30 at 12 pm, pt aware and all instructions have been reviewed her via phone, again advised pt if symptoms worsen before then to call our office or report to ER, she is agreeable.

## 2017-08-08 NOTE — Patient Instructions (Signed)
We have sent in a prescription for Nitroglycerin to use as needed for chest pain, place 1 tab under your tongue may repeat 5 minutes later if pain persist and gain 5 minutes later, if you take the 3rd tab please call 911 immediately  If you symptoms worse please call our office or report to ER immediately  Your physician has requested that you have an exercise tolerance test. For further information please visit [email or web address]. Please also follow instruction sheet, as given.  Your physician has requested that you have an echocardiogram. Echocardiography is a painless test that uses sound waves to create images of your heart. It provides your doctor with information about the size and shape of your heart and how well your heart's chambers and valves are working. This procedure takes approximately one hour. There are no restrictions for this procedure.  We will contact you in 6 months to schedule your next appointment.

## 2017-08-08 NOTE — Progress Notes (Signed)
PCP: Dr. Andria Frames Referring: Dr. Kirke Corin    HPI:  Rhonda Brewer is a 67 y/o woman with h/o scleroderma, CAD s/p stent 9/17 , SVT s/p ablation 2/05 referred by Dr. Patrecia Pour for screening for [facility] in setting of scleroderma.   Previously lived in [place]. 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 At time had exertional fatigue and dyspnea and arm tingling. No CP.    Cath 11/24/15 LM: mild irregs LAD: 20% mid LCX: mild irreg RCA: mRCA 99% ->Xience Alpine DES 3.5x33mm  Echo 11/22/15: LVEF 55-60% Mild AI. Normal RV. Mild TR.  Echo 08/27/16: EF 60-65%,  RV normal Mild AI. No RV strain or PAH.   PFTs 08/2016 FEV1 2.11 (88%) FVC 2.86 (91%) DLCO 63%  She was seen in the HF clinic for initial evaluation in June 2018. It was felt that her dyspnea could be due to Brilinta. She was switched to Plavix. Chest CT was ordered and showed no interstitial lung disease.   She returns today for follow up. Has been going to gym regularly. 5-6x per week. Does weights, cardio and yoga. For cardio will do Zumba and other classes. Also walks a lot. This week was doing Zumba and felt SOB and a little chest tightness. Says symptoms lingered for a while. Exercised today with yoga and felt ok but didn't push it. Some SOB when bending over. Gets occasional chest tightness which is relatively. Symptoms similar to symptoms prior to stent in 2017.    Past Medical History:  Diagnosis Date  . Allergy   . Arthritis   . CAD (coronary artery disease)    a. DES to RCA 11/2015  . Cancer [facility])    Uterine 2008  . Heart murmur   . Hyperlipidemia   . Osteoporosis   . Thyroid disease     Current Outpatient Medications  Medication Sig Dispense Refill  . aspirin EC 81 MG tablet Take by mouth.    Marland Kitchen atorvastatin (LIPITOR) 40 MG tablet Take 40 mg by mouth daily.     . cycloSPORINE (RESTASIS) 0.05 % ophthalmic emulsion Place 1  drop into both eyes 2 (two) times daily.    Marland Kitchen glucosamine-chondroitin 500-400 MG tablet Take 1 tablet by mouth daily.     . hydroxychloroquine (PLAQUENIL) 200 MG tablet Take 1 tablet (200 mg total) by mouth 2 (two) times daily. Monday through Friday 120 tablet 0  . levothyroxine (SYNTHROID) 75 MCG tablet Take 75 mcg by mouth daily before breakfast.     . levothyroxine (SYNTHROID) 75 MCG tablet Take 75 mcg by mouth daily.    . Multiple Vitamin (MULTI-VITAMINS) TABS Take 1 tablet by mouth daily.     . pantoprazole (PROTONIX) 40 MG tablet TAKE 1 TABLET(40 MG) BY MOUTH DAILY 90 tablet 3  . vitamin B-12 (CYANOCOBALAMIN) 100 MCG tablet Take 100 mcg by mouth daily.     No current facility-administered medications for this encounter.      Allergies  Allergen Reactions  . Avelox [Moxifloxacin Hcl In Nacl] Other (See Comments)    dizziness  . Other     SEASONAL     Social History   Socioeconomic History  . Marital status: Married    Spouse name: Not on file  . Number of children: 2  . Years of education: Not on file  . Highest education level: Not on file  Occupational History  .  Not on file  Social Needs  . Financial resource strain: Not on file  . Food insecurity:    Worry: Not on file    Inability: Not on file  . Transportation needs:    Medical: Not on file    Non-medical: Not on file  Tobacco Use  . Smoking status: Never Smoker  . Smokeless tobacco: Never Used  Substance and Sexual Activity  . Alcohol use: Yes    Comment: occ  . Drug use: No  . Sexual activity: Not on file    Comment: hysterectomy  Lifestyle  . Physical activity:    Days per week: Not on file    Minutes per session: Not on file  . Stress: Not on file  Relationships  . Social connections:    Talks on phone: Not on file    Gets together: Not on file    Attends religious service: Not on file    Active member of club or organization: Not on file    Attends meetings of clubs or organizations: Not on  file    Relationship status: Not on file  . Intimate partner violence:    Fear of current or ex partner: Not on file    Emotionally abused: Not on file    Physically abused: Not on file    Forced sexual activity: Not on file  Other Topics Concern  . Not on file  Social History Narrative  . Not on file    Family History  Problem Relation Age of Onset  . Asthma Maternal Aunt   . Heart disease Mother   . Diabetes Mother   . Heart disease Father   . Stomach cancer Brother   . Colon cancer Neg Hx   . Esophageal cancer Neg Hx   . Rectal cancer Neg Hx     PHYSICAL EXAM: Vitals:   08/08/17 1029  BP: 135/68  Pulse: 63  SpO2: 100%    General:  Well appearing. No resp difficulty HEENT: normal Neck: supple. no JVD. Carotids 2+ bilat; no bruits. No lymphadenopathy or thryomegaly appreciated. Cor: PMI nondisplaced. Regular rate & rhythm. No rubs, gallops or murmurs. Lungs: clear Abdomen: soft, nontender, nondistended. No hepatosplenomegaly. No bruits or masses. Good bowel sounds. Extremities: no cyanosis, clubbing, rash, edema Neuro: alert & orientedx3, cranial nerves grossly intact. moves all 4 extremities w/o difficulty. Affect pleasant   ECG: NSR 63 PRWP No ST-T wave abnormalities. Personally reviewed   ASSESSMENT & PLAN:  1. CAD s/p RCA stent in 11/2015: - Now off Plavix - Now with recurrent onset of exertional chest tightness and dyspnea. Similar to previous angina - We discussed stress test vs cath. Initially she chose stress test and echo but has called back and decided on cath. Will arrange for next week.  - Continue atorvastatin 40 mg - HR too low for beta blocker.   2. Scleroderma - DLCO mildly reduced. No R heart strain on echo. CT without ILD.  - Will repeat echo and PFTs - Perform RHC at time of coronary angio    Glori Bickers, MD  11:08 AM

## 2017-08-09 ENCOUNTER — Other Ambulatory Visit: Payer: Self-pay | Admitting: Rheumatology

## 2017-08-10 ENCOUNTER — Telehealth: Payer: Self-pay | Admitting: Cardiology

## 2017-08-10 NOTE — Telephone Encounter (Signed)
Pt with increased chest pressure and SOB with walk, now at home.  No pressure but SOB, she is scheduled for cath on 08/14/17.  Discussed with Dr. Haroldine Laws and if symptoms have increased she should come to Er at [facility].  Instructed pt and she is agreeable.

## 2017-08-12 ENCOUNTER — Encounter: Payer: Self-pay | Admitting: Rheumatology

## 2017-08-12 ENCOUNTER — Ambulatory Visit (INDEPENDENT_AMBULATORY_CARE_PROVIDER_SITE_OTHER): Payer: Medicare Other | Admitting: Rheumatology

## 2017-08-12 VITALS — BP 107/59 | HR 65 | Resp 14 | Ht 64.0 in | Wt 137.0 lb

## 2017-08-12 DIAGNOSIS — D696 Thrombocytopenia, unspecified: Secondary | ICD-10-CM

## 2017-08-12 DIAGNOSIS — M8589 Other specified disorders of bone density and structure, multiple sites: Secondary | ICD-10-CM

## 2017-08-12 DIAGNOSIS — Z8639 Personal history of other endocrine, nutritional and metabolic disease: Secondary | ICD-10-CM

## 2017-08-12 DIAGNOSIS — Z8542 Personal history of malignant neoplasm of other parts of uterus: Secondary | ICD-10-CM

## 2017-08-12 DIAGNOSIS — Z8679 Personal history of other diseases of the circulatory system: Secondary | ICD-10-CM | POA: Diagnosis not present

## 2017-08-12 DIAGNOSIS — I7300 Raynaud's syndrome without gangrene: Secondary | ICD-10-CM

## 2017-08-12 DIAGNOSIS — Z79899 Other long term (current) drug therapy: Secondary | ICD-10-CM | POA: Diagnosis not present

## 2017-08-12 DIAGNOSIS — M349 Systemic sclerosis, unspecified: Secondary | ICD-10-CM | POA: Diagnosis not present

## 2017-08-14 ENCOUNTER — Ambulatory Visit (HOSPITAL_COMMUNITY)
Admission: RE | Admit: 2017-08-14 | Discharge: 2017-08-14 | Disposition: A | Discharge status: Home / Self Care | Payer: Medicare Other | Source: Ambulatory Visit | Attending: Internal Medicine | Admitting: Internal Medicine

## 2017-08-14 ENCOUNTER — Ambulatory Visit (HOSPITAL_COMMUNITY)
Admission: RE | Disposition: A | Discharge status: Home / Self Care | Payer: Self-pay | Source: Ambulatory Visit | Attending: Internal Medicine

## 2017-08-14 ENCOUNTER — Other Ambulatory Visit: Payer: Self-pay

## 2017-08-14 ENCOUNTER — Encounter (HOSPITAL_COMMUNITY): Payer: Self-pay | Admitting: Emergency Medicine

## 2017-08-14 DIAGNOSIS — M810 Age-related osteoporosis without current pathological fracture: Secondary | ICD-10-CM | POA: Insufficient documentation

## 2017-08-14 DIAGNOSIS — Z79890 Hormone replacement therapy (postmenopausal): Secondary | ICD-10-CM | POA: Diagnosis not present

## 2017-08-14 DIAGNOSIS — R011 Cardiac murmur, unspecified: Secondary | ICD-10-CM | POA: Insufficient documentation

## 2017-08-14 DIAGNOSIS — Z955 Presence of coronary angioplasty implant and graft: Secondary | ICD-10-CM | POA: Diagnosis not present

## 2017-08-14 DIAGNOSIS — Z79899 Other long term (current) drug therapy: Secondary | ICD-10-CM | POA: Insufficient documentation

## 2017-08-14 DIAGNOSIS — Z833 Family history of diabetes mellitus: Secondary | ICD-10-CM | POA: Diagnosis not present

## 2017-08-14 DIAGNOSIS — E785 Hyperlipidemia, unspecified: Secondary | ICD-10-CM | POA: Insufficient documentation

## 2017-08-14 DIAGNOSIS — Z888 Allergy status to other drugs, medicaments and biological substances status: Secondary | ICD-10-CM | POA: Diagnosis not present

## 2017-08-14 DIAGNOSIS — Z8249 Family history of ischemic heart disease and other diseases of the circulatory system: Secondary | ICD-10-CM | POA: Insufficient documentation

## 2017-08-14 DIAGNOSIS — E079 Disorder of thyroid, unspecified: Secondary | ICD-10-CM | POA: Diagnosis not present

## 2017-08-14 DIAGNOSIS — I471 Supraventricular tachycardia: Secondary | ICD-10-CM | POA: Insufficient documentation

## 2017-08-14 DIAGNOSIS — R0609 Other forms of dyspnea: Secondary | ICD-10-CM

## 2017-08-14 DIAGNOSIS — I25119 Atherosclerotic heart disease of native coronary artery with unspecified angina pectoris: Secondary | ICD-10-CM | POA: Diagnosis not present

## 2017-08-14 DIAGNOSIS — Z7982 Long term (current) use of aspirin: Secondary | ICD-10-CM | POA: Diagnosis not present

## 2017-08-14 DIAGNOSIS — Z7902 Long term (current) use of antithrombotics/antiplatelets: Secondary | ICD-10-CM | POA: Insufficient documentation

## 2017-08-14 DIAGNOSIS — R0789 Other chest pain: Secondary | ICD-10-CM

## 2017-08-14 DIAGNOSIS — Z8542 Personal history of malignant neoplasm of other parts of uterus: Secondary | ICD-10-CM | POA: Insufficient documentation

## 2017-08-14 DIAGNOSIS — M349 Systemic sclerosis, unspecified: Secondary | ICD-10-CM | POA: Diagnosis not present

## 2017-08-14 DIAGNOSIS — I2510 Atherosclerotic heart disease of native coronary artery without angina pectoris: Secondary | ICD-10-CM

## 2017-08-14 HISTORY — PX: RIGHT/LEFT HEART CATH AND CORONARY ANGIOGRAPHY: CATH118266

## 2017-08-14 HISTORY — PX: ULTRASOUND GUIDANCE FOR VASCULAR ACCESS: SHX6516

## 2017-08-14 LAB — POCT I-STAT 3, VENOUS BLOOD GAS (G3P V)
ACID-BASE DEFICIT: 6 mmol/L — AB (ref 0.0–2.0)
ACID-BASE DEFICIT: 1 mmol/L (ref 0.0–2.0)
Acid-base deficit: 2 mmol/L (ref 0.0–2.0)
Acid-base deficit: 2 mmol/L (ref 0.0–2.0)
BICARBONATE: 24.3 mmol/L (ref 20.0–28.0)
BICARBONATE: 25.6 mmol/L (ref 20.0–28.0)
Bicarbonate: 19.1 mmol/L — ABNORMAL LOW (ref 20.0–28.0)
Bicarbonate: 27.2 mmol/L (ref 20.0–28.0)
Bicarbonate: 24.7 mmol/L (ref 20.0–28.0)
O2 SAT: 68 %
O2 SAT: 79 %
O2 SAT: 75 %
O2 Saturation: 80 %
O2 Saturation: 78 %
PH VEN: 7.321 (ref 7.250–7.430)
PH VEN: 7.344 (ref 7.250–7.430)
PH VEN: 7.335 (ref 7.250–7.430)
PO2 VEN: 47 mmHg — AB (ref 32.0–45.0)
PO2 VEN: 43 mmHg (ref 32.0–45.0)
TCO2: 20 mmol/L — AB (ref 22–32)
TCO2: 26 mmol/L (ref 22–32)
TCO2: 29 mmol/L (ref 22–32)
TCO2: 26 mmol/L (ref 22–32)
TCO2: 27 mmol/L (ref 22–32)
pCO2, Ven: 36.9 mmHg — ABNORMAL LOW (ref 44.0–60.0)
pCO2, Ven: 44.6 mmHg (ref 44.0–60.0)
pCO2, Ven: 52.3 mmHg (ref 44.0–60.0)
pCO2, Ven: 46.2 mmHg (ref 44.0–60.0)
pCO2, Ven: 48.1 mmHg (ref 44.0–60.0)
pH, Ven: 7.323 (ref 7.250–7.430)
pH, Ven: 7.335 (ref 7.250–7.430)
pO2, Ven: 39 mmHg (ref 32.0–45.0)
pO2, Ven: 47 mmHg — ABNORMAL HIGH (ref 32.0–45.0)
pO2, Ven: 46 mmHg — ABNORMAL HIGH (ref 32.0–45.0)

## 2017-08-14 LAB — CBC
HCT: 44.6 % (ref 36.0–46.0)
HEMOGLOBIN: 14.9 g/dL (ref 12.0–15.0)
MCH: 31.4 pg (ref 26.0–34.0)
MCHC: 33.4 g/dL (ref 30.0–36.0)
MCV: 94.1 fL (ref 78.0–100.0)
Platelets: 196 10*3/uL (ref 150–400)
RBC: 4.74 MIL/uL (ref 3.87–5.11)
RDW: 11.9 % (ref 11.5–15.5)
WBC: 6.3 10*3/uL (ref 4.0–10.5)

## 2017-08-14 LAB — BASIC METABOLIC PANEL
ANION GAP: 7 (ref 5–15)
BUN: 13 mg/dL (ref 6–20)
CALCIUM: 9.5 mg/dL (ref 8.9–10.3)
CHLORIDE: 108 mmol/L (ref 101–111)
CO2: 25 mmol/L (ref 22–32)
Creatinine, Ser: 0.76 mg/dL (ref 0.44–1.00)
GFR calc non Af Amer: >60 mL/min (ref >60)
Glucose, Bld: 91 mg/dL (ref 65–99)
Potassium: 4.3 mmol/L (ref 3.5–5.1)
Sodium: 140 mmol/L (ref 135–145)

## 2017-08-14 SURGERY — RIGHT/LEFT HEART CATH AND CORONARY ANGIOGRAPHY
Anesthesia: LOCAL

## 2017-08-14 MED ORDER — HEPARIN (PORCINE) IN NACL 1000-0.9 UT/500ML-% IV SOLN
INTRAVENOUS | Status: AC
  Filled 2017-08-14: qty 1000

## 2017-08-14 MED ORDER — FENTANYL CITRATE (PF) 100 MCG/2ML IJ SOLN
INTRAMUSCULAR | Status: AC
  Filled 2017-08-14: qty 2

## 2017-08-14 MED ORDER — HEPARIN SODIUM (PORCINE) 1000 UNIT/ML IJ SOLN
INTRAMUSCULAR | Status: DC | PRN
  Administered 2017-08-14: 3000 [IU] via INTRAVENOUS

## 2017-08-14 MED ORDER — LIDOCAINE HCL (PF) 1 % IJ SOLN
INTRAMUSCULAR | Status: AC
  Filled 2017-08-14: qty 30

## 2017-08-14 MED ORDER — LEVOTHYROXINE SODIUM 75 MCG PO TABS: 75.0000 ug | ORAL_TABLET | Freq: Every day | ORAL | 4 refills | Status: DC

## 2017-08-14 MED ORDER — IOPAMIDOL (ISOVUE-370) INJECTION 76%
INTRAVENOUS | Status: DC | PRN
  Administered 2017-08-14: 30 mL via INTRA_ARTERIAL

## 2017-08-14 MED ORDER — IOPAMIDOL (ISOVUE-370) INJECTION 76%
INTRAVENOUS | Status: AC
  Filled 2017-08-14: qty 100

## 2017-08-14 MED ORDER — LIDOCAINE HCL (PF) 1 % IJ SOLN
INTRAMUSCULAR | Status: DC | PRN
  Administered 2017-08-14: 1 mL
  Administered 2017-08-14: 2 mL
  Administered 2017-08-14 (×2): 15 mL

## 2017-08-14 MED ORDER — SODIUM CHLORIDE 0.9% FLUSH: 3.0000 mL | Freq: Two times a day (BID) | INTRAVENOUS | Status: DC

## 2017-08-14 MED ORDER — IOHEXOL 350 MG/ML SOLN: INTRAVENOUS | Status: DC | PRN

## 2017-08-14 MED ORDER — SODIUM CHLORIDE 0.9 % IV SOLN: 250.0000 mL | INTRAVENOUS | Status: DC | PRN

## 2017-08-14 MED ORDER — VERAPAMIL HCL 2.5 MG/ML IV SOLN
INTRAVENOUS | Status: AC
  Filled 2017-08-14: qty 2

## 2017-08-14 MED ORDER — ONDANSETRON HCL 4 MG/2ML IJ SOLN: 4.0000 mg | Freq: Four times a day (QID) | INTRAMUSCULAR | Status: DC | PRN

## 2017-08-14 MED ORDER — ASPIRIN 81 MG PO CHEW: 81.0000 mg | CHEWABLE_TABLET | ORAL | Status: DC

## 2017-08-14 MED ORDER — SODIUM CHLORIDE 0.9% FLUSH: 3.0000 mL | INTRAVENOUS | Status: DC | PRN

## 2017-08-14 MED ORDER — SODIUM CHLORIDE 0.9 % IV SOLN
INTRAVENOUS | Status: DC
  Administered 2017-08-14: 11:00:00 via INTRAVENOUS

## 2017-08-14 MED ORDER — FENTANYL CITRATE (PF) 100 MCG/2ML IJ SOLN
INTRAMUSCULAR | Status: DC | PRN
  Administered 2017-08-14 (×2): 25 ug via INTRAVENOUS

## 2017-08-14 MED ORDER — SODIUM CHLORIDE 0.9 % IV SOLN: INTRAVENOUS | Status: AC

## 2017-08-14 MED ORDER — MIDAZOLAM HCL 2 MG/2ML IJ SOLN
INTRAMUSCULAR | Status: AC
  Filled 2017-08-14: qty 2

## 2017-08-14 MED ORDER — MIDAZOLAM HCL 2 MG/2ML IJ SOLN
INTRAMUSCULAR | Status: DC | PRN
  Administered 2017-08-14 (×2): 1 mg via INTRAVENOUS

## 2017-08-14 MED ORDER — HEPARIN SODIUM (PORCINE) 1000 UNIT/ML IJ SOLN
INTRAMUSCULAR | Status: AC
  Filled 2017-08-14: qty 1

## 2017-08-14 MED ORDER — HEPARIN (PORCINE) IN NACL 2-0.9 UNITS/ML
INTRAMUSCULAR | Status: DC | PRN
  Administered 2017-08-14 (×2): 500 mL

## 2017-08-14 MED ORDER — ACETAMINOPHEN 325 MG PO TABS: 650.0000 mg | ORAL_TABLET | ORAL | Status: DC | PRN

## 2017-08-14 MED ORDER — VERAPAMIL HCL 2.5 MG/ML IV SOLN
INTRAVENOUS | Status: DC | PRN
  Administered 2017-08-14: 10 mL via INTRA_ARTERIAL

## 2017-08-14 SURGICAL SUPPLY — 18 items
CATH 5FR JL3.5 JR4 ANG PIG MP (CATHETERS) ×3 IMPLANT
CATH BALLN WEDGE 5F 110CM (CATHETERS) ×3 IMPLANT
CATH SWAN GANZ 7F STRAIGHT (CATHETERS) ×3 IMPLANT
COVER PRB 48X5XTLSCP FOLD TPE (BAG) ×4 IMPLANT
COVER PROBE 5X48 (BAG) ×2
DEVICE RAD COMP TR BAND LRG (VASCULAR PRODUCTS) ×3 IMPLANT
GUIDEWIRE .025 260CM (WIRE) ×3 IMPLANT
GUIDEWIRE INQWIRE 1.5J.035X260 (WIRE) ×2 IMPLANT
INQWIRE 1.5J .035X260CM (WIRE) ×3
KIT HEART LEFT (KITS) ×3 IMPLANT
KIT MICROPUNCTURE NIT STIFF (SHEATH) ×6 IMPLANT
NEEDLE PERC 21GX4CM (NEEDLE) ×3 IMPLANT
PACK CARDIAC CATHETERIZATION (CUSTOM PROCEDURE TRAY) ×3 IMPLANT
SHEATH AVANTI 11CM 7FR (SHEATH) ×3 IMPLANT
SHEATH RAIN 4/5FR (SHEATH) ×3 IMPLANT
SHEATH RAIN RADIAL 21G 6FR (SHEATH) ×3 IMPLANT
TRANSDUCER W/STOPCOCK (MISCELLANEOUS) ×3 IMPLANT
TUBING CIL FLEX 10 FLL-RA (TUBING) ×3 IMPLANT

## 2017-08-14 NOTE — Progress Notes (Signed)
Site area: left fv sheath Site Prior to Removal:  Level 0 Pressure Applied For: 10 minutes Manual:   yes Patient Status During Pull:  stable Post Pull Site:  Level 0 Post Pull Instructions Given:  yes Post Pull Pulses Present: palpable left dp Dressing Applied:  Gauze and tegaderm Bedrest begins @ 1510 Comments:

## 2017-08-14 NOTE — Research (Signed)
OPTIMIZE Informed Consent   Subject Name: Sheyanne Munley  Subject met inclusion and exclusion criteria.  The informed consent form, study requirements and expectations were reviewed with the subject and questions and concerns were addressed prior to the signing of the consent form.  The subject verbalized understanding of the trail requirements.  The subject agreed to participate in the OPTIMIZE trial and signed the informed consent.  The informed consent was obtained prior to performance of any protocol-specific procedures for the subject.  A copy of the signed informed consent was given to the subject and a copy was placed in the subject's medical record. Applicable only if Randomized.  Hedrick,Lavell Ridings W 08/14/2017, 12:22 PM

## 2017-08-14 NOTE — Interval H&P Note (Signed)
History and Physical Interval Note:  08/14/2017 12:59 PM  Rhonda Brewer  has presented today for surgery, with the diagnosis of cp  The various methods of treatment have been discussed with the patient and family. After consideration of risks, benefits and other options for treatment, the patient has consented to  Procedure(s): LEFT HEART CATH AND CORONARY ANGIOGRAPHY ([name]) and possible coronary angioplasty as a surgical intervention .  The patient's history has been reviewed, patient examined, no change in status, stable for surgery.  I have reviewed the patient's chart and labs.  Questions were answered to the patient's satisfaction.     Daniel Bensimhon

## 2017-08-14 NOTE — Research (Signed)
CADFEM Informed Consent   Subject Name: Rhonda Brewer  Subject met inclusion and exclusion criteria.  The informed consent form, study requirements and expectations were reviewed with the subject and questions and concerns were addressed prior to the signing of the consent form.  The subject verbalized understanding of the trail requirements.  The subject agreed to participate in the CADFEM trial and signed the informed consent.  The informed consent was obtained prior to performance of any protocol-specific procedures for the subject.  A copy of the signed informed consent was given to the subject and a copy was placed in the subject's medical record.  Christena Flake 08/14/2017, 11:06 AM

## 2017-08-14 NOTE — Discharge Instructions (Signed)

## 2017-08-15 ENCOUNTER — Encounter (HOSPITAL_COMMUNITY): Payer: Self-pay | Admitting: Internal Medicine

## 2017-08-15 MED FILL — Heparin Sod (Porcine)-NaCl IV Soln 1000 Unit/500ML-0.9%: INTRAVENOUS | Qty: 1000 | Status: AC

## 2017-08-29 DIAGNOSIS — M79641 Pain in right hand: Secondary | ICD-10-CM | POA: Diagnosis not present

## 2017-08-29 DIAGNOSIS — G5601 Carpal tunnel syndrome, right upper limb: Secondary | ICD-10-CM | POA: Diagnosis not present

## 2017-09-17 ENCOUNTER — Telehealth (HOSPITAL_COMMUNITY): Payer: Self-pay | Admitting: *Deleted

## 2017-09-17 DIAGNOSIS — R918 Other nonspecific abnormal finding of lung field: Secondary | ICD-10-CM

## 2017-09-17 NOTE — Telephone Encounter (Signed)
Pt is due for repeat CT scan to eval pulm nodule seen June 2018, pt is aware she is due for this, order placed

## 2017-09-25 ENCOUNTER — Ambulatory Visit (HOSPITAL_COMMUNITY): Payer: Medicare Other

## 2017-09-25 ENCOUNTER — Telehealth (HOSPITAL_COMMUNITY): Payer: Self-pay | Admitting: *Deleted

## 2017-09-25 NOTE — Telephone Encounter (Signed)
Received a message on triage line from Echo with [facility] in [place], [place] asking for medical records.  She didn't specify which records and didn't leave a fax number to send records.  I tried calling her back but had to leave a message.  Ref# she provided was [id number].

## 2017-09-29 ENCOUNTER — Encounter (HOSPITAL_COMMUNITY): Payer: Self-pay | Admitting: *Deleted

## 2017-09-29 NOTE — Progress Notes (Signed)
Received medical record request from [facility].  Requested records faxed today to [phone].  Original request will be scanned to patient's electronic medical record.

## 2017-10-07 ENCOUNTER — Ambulatory Visit (HOSPITAL_COMMUNITY)
Admission: RE | Admit: 2017-10-07 | Discharge: 2017-10-07 | Disposition: A | Discharge status: Home / Self Care | Payer: Medicare Other | Source: Ambulatory Visit | Attending: Internal Medicine | Admitting: Internal Medicine

## 2017-10-07 DIAGNOSIS — I313 Pericardial effusion (noninflammatory): Secondary | ICD-10-CM | POA: Diagnosis not present

## 2017-10-07 DIAGNOSIS — I2510 Atherosclerotic heart disease of native coronary artery without angina pectoris: Secondary | ICD-10-CM | POA: Insufficient documentation

## 2017-10-07 DIAGNOSIS — I700 Atherosclerosis of aorta: Secondary | ICD-10-CM | POA: Insufficient documentation

## 2017-10-07 DIAGNOSIS — R911 Solitary pulmonary nodule: Secondary | ICD-10-CM | POA: Diagnosis not present

## 2017-10-07 DIAGNOSIS — R918 Other nonspecific abnormal finding of lung field: Secondary | ICD-10-CM | POA: Insufficient documentation

## 2017-10-10 ENCOUNTER — Telehealth (HOSPITAL_COMMUNITY): Payer: Self-pay | Admitting: Cardiology

## 2017-10-10 NOTE — Telephone Encounter (Signed)
Surgical clearance faxed to [facility] faxed to [phone] Per Dr Haroldine Laws patient is low risk for surgery and ok to stop ASA is needed 5 days pror

## 2017-10-15 DIAGNOSIS — G5601 Carpal tunnel syndrome, right upper limb: Secondary | ICD-10-CM | POA: Diagnosis not present

## 2017-10-15 HISTORY — PX: CARPAL TUNNEL RELEASE: SHX101

## 2017-10-24 ENCOUNTER — Other Ambulatory Visit (HOSPITAL_COMMUNITY): Payer: Self-pay

## 2017-10-24 MED ORDER — ATORVASTATIN CALCIUM 40 MG PO TABS: 40.0000 mg | ORAL_TABLET | Freq: Every day | ORAL | 5 refills | Status: DC

## 2017-11-11 ENCOUNTER — Encounter: Payer: Self-pay | Admitting: Family Medicine

## 2017-11-11 DIAGNOSIS — M545 Low back pain, unspecified: Secondary | ICD-10-CM

## 2017-11-11 DIAGNOSIS — G8929 Other chronic pain: Secondary | ICD-10-CM

## 2017-11-11 NOTE — Telephone Encounter (Signed)
Order entered

## 2017-11-20 DIAGNOSIS — Z79899 Other long term (current) drug therapy: Secondary | ICD-10-CM | POA: Diagnosis not present

## 2017-11-20 DIAGNOSIS — M0600 Rheumatoid arthritis without rheumatoid factor, unspecified site: Secondary | ICD-10-CM | POA: Diagnosis not present

## 2017-11-20 DIAGNOSIS — H43823 Vitreomacular adhesion, bilateral: Secondary | ICD-10-CM | POA: Diagnosis not present

## 2017-11-20 DIAGNOSIS — H2513 Age-related nuclear cataract, bilateral: Secondary | ICD-10-CM | POA: Diagnosis not present

## 2017-11-27 DIAGNOSIS — L218 Other seborrheic dermatitis: Secondary | ICD-10-CM | POA: Diagnosis not present

## 2017-11-27 DIAGNOSIS — L814 Other melanin hyperpigmentation: Secondary | ICD-10-CM | POA: Diagnosis not present

## 2017-11-27 DIAGNOSIS — L819 Disorder of pigmentation, unspecified: Secondary | ICD-10-CM | POA: Diagnosis not present

## 2017-11-27 DIAGNOSIS — D692 Other nonthrombocytopenic purpura: Secondary | ICD-10-CM | POA: Diagnosis not present

## 2017-11-27 DIAGNOSIS — L821 Other seborrheic keratosis: Secondary | ICD-10-CM | POA: Diagnosis not present

## 2017-11-27 DIAGNOSIS — D2371 Other benign neoplasm of skin of right lower limb, including hip: Secondary | ICD-10-CM | POA: Diagnosis not present

## 2017-12-02 ENCOUNTER — Other Ambulatory Visit: Payer: Self-pay | Admitting: Family Medicine

## 2017-12-02 DIAGNOSIS — Z1231 Encounter for screening mammogram for malignant neoplasm of breast: Secondary | ICD-10-CM

## 2017-12-04 ENCOUNTER — Ambulatory Visit: Payer: Medicare Other | Admitting: Rheumatology

## 2017-12-04 DIAGNOSIS — M545 Low back pain: Secondary | ICD-10-CM | POA: Diagnosis not present

## 2017-12-04 DIAGNOSIS — G8929 Other chronic pain: Secondary | ICD-10-CM | POA: Diagnosis not present

## 2017-12-08 DIAGNOSIS — Z23 Encounter for immunization: Secondary | ICD-10-CM | POA: Diagnosis not present

## 2017-12-09 ENCOUNTER — Encounter: Payer: Self-pay | Admitting: Family Medicine

## 2017-12-09 DIAGNOSIS — G8929 Other chronic pain: Secondary | ICD-10-CM | POA: Diagnosis not present

## 2017-12-09 DIAGNOSIS — M545 Low back pain: Secondary | ICD-10-CM | POA: Diagnosis not present

## 2017-12-16 ENCOUNTER — Ambulatory Visit (INDEPENDENT_AMBULATORY_CARE_PROVIDER_SITE_OTHER): Payer: Medicare Other | Admitting: Internal Medicine

## 2017-12-16 ENCOUNTER — Encounter: Payer: Self-pay | Admitting: Internal Medicine

## 2017-12-16 VITALS — BP 128/76 | HR 60 | Ht 63.0 in | Wt 134.8 lb

## 2017-12-16 DIAGNOSIS — M349 Systemic sclerosis, unspecified: Secondary | ICD-10-CM | POA: Diagnosis not present

## 2017-12-16 DIAGNOSIS — R0600 Dyspnea, unspecified: Secondary | ICD-10-CM | POA: Diagnosis not present

## 2017-12-16 DIAGNOSIS — R0689 Other abnormalities of breathing: Secondary | ICD-10-CM

## 2017-12-16 NOTE — Patient Instructions (Addendum)
ICD-10-CM   1. Dyspnea and respiratory abnormalities R06.00    R06.89   2. Scleroderma (HCC) M34.9     Mild shortness of breath that is baseline No evidence of ILD disease from scleroderma base donJuly 2019 CT or exam 12/16/2017 No evidence of pulmonary hypertensio - June 2018 Echo  Plan - do walk test 12/16/2017 - monitor symptoms - Please talk to PCP Hensel, Jamal Collin, MD -  and ensure you get  shingrix ([place]) inactivated vaccine against shingles   followup - 1 year do Pre-bd spiro and dlco only. No lung volume or bd response. No post-bd spiro - return to regular clinic in 1 year or sooner if needed

## 2017-12-16 NOTE — Progress Notes (Addendum)
PCP Rhonda Resides, MD   HPI  IOV 08/16/2016  Chief Complaint  Patient presents with  . Advice Only    Referred by Rhonda Brewer for scleroderma.  c/o worsening sob, chest tightness with exertion X1 month.    HPI 67 year old woman with history of scleroderma on hydroxychloroquine, CAD s/p stent 11/2015, uterine cancer dx in 2008 s/p hysterectomy, chemo/radtx presenting for evaluation of dyspnea on exertion.  She is followed by Dr. Patrecia Brewer for rheumatology, Dr. Haroldine Brewer for cardiology. Previously was followed by Dr. Gwynneth Brewer in [place]. Last saw Rhonda Brewer 06/19/2016. She has had scleroderma for 10 years and only has been on hydroxychloroquine. She moved to [facility] in January 2018.  For the past month, she has dyspnea with going up one flight of stairs most times. This is new. Also sometimes gets short of breath with bending over to pick something up. Denies cough. She does yoga, strength training, and walking for exercise. She can walk a couple of miles without any issues. She is independent in all her activities of daily living. Does not use any devices to aid in walking. Denies GERD. Has not had a pulmonologist.  She had shortness of breath that prompted the cardiac evaluation leading to stent placement. She has an appointment with cardiology June 12. Review of Systems  S: See resident physician for details. She has scleroderma for over 10 years for which she is on Actonel. Denies any associated acid reflux. Started on was believed only to involve the skin. I personally evaluated the history that this patient had a few months of shortness of breath in 2017 between summer and fall that then resulted in worsening dyspnea on exertion. That then resulted in a cardiac stent. After this dyspnea resolved. Then subsequently in January 2018 moved from the [place] area to [place], [place]. Now for the last 1 month she's having recurrent dyspnea on exertion. She feels this is  from the heart. She does not think is a lung issue. Relieved by rest. She notices it for climbing stairs relieved by rest. She has cardiology appointment pending. There is a pulmonary function test and echocardiogram pending on 08/27/2016. She is reluctant to get a CT chest. Walking desat test in office 185 feet x  3 laps on RA: 100% at rest and exertion  Recent pertinent labs  Results for Rhonda, Brewer (MRN [id number]) as of 08/16/2016 10:29  Ref. Range 06/19/2016 10:38 06/26/2016 10:04  Creatinine Latest Ref Range: 0.50 - 0.99 mg/dL 0.79   Results for Rhonda, Brewer (MRN [id number]) as of 08/16/2016 10:29  Ref. Range 06/19/2016 10:38 06/26/2016 10:04  Hemoglobin Latest Ref Range: 11.7 - 15.5 g/dL 14.8      has a past medical history of Cancer ([place]).   reports that she has never smoked. She has never used smokeless tobacco.   OV 11/05/2016  Chief Complaint  Patient presents with  . Follow-up    Pt here after CT and PFT. Pt denies change in SOB since last OV. Pt denies cough, CP/tightness, f/c/s.     Follow-up scleroderma with associated shortness of breath   last seen in June 2018. At that time he thought the suspicion for interstitial lung disease was low. She had Pulm  function test that showed isolated reduction in diffusion capacity to 63%. This raises the possibility of interstitial lung disease but she did have a high-resolution CT scan of the chest that is documented below. Interstitial lung disease has been ruled out.  This no pulmonary parenchymal abnormality. Her hemoglobin was 14.8 g percent suggesting no anemia causing shortness of breath. She then followed up with cardiology. According to the echocardiogram she does not have pulmonary hypertension which can be seen and started on the patient's. She was on  BRILINTA for CAD - this got changed to Plavix and her dyspnea resolved. Currently she is doing well does not have any rest symptoms.   Results for Rhonda, Brewer (MRN [id number]) as  of 11/05/2016 11:24  Ref. Range 08/27/2016 11:19  FVC-Pre Latest Units: L 2.86  FVC-%Pred-Pre Latest Units: % 91  FEV1-Pre Latest Units: L 2.11  FEV1-%Pred-Pre Latest Units: % 88  Pre FEV1/FVC ratio Latest Units: % 74    Results for Rhonda, Brewer (MRN [id number]) as of 11/05/2016 11:24  Ref. Range 08/27/2016 11:19  TLC Latest Units: L 4.64  TLC % pred Latest Units: % 91  Results for Rhonda, Brewer (MRN [id number]) as of 11/05/2016 11:24  Ref. Range 08/27/2016 11:19  DLCO unc Latest Units: ml/min/mmHg 15.42  DLCO unc % pred Latest Units: % 63   IMPRESSION: 1. No evidence of interstitial lung disease. No acute pulmonary disease . 2. Solitary 3 mm solid apical right upper lobe pulmonary nodule. No follow-up needed if patient is low-risk. Non-contrast chest CT can be considered in 12 months if patient is high-risk. This recommendation follows the consensus statement: Guidelines for Management of Incidental Pulmonary Nodules Detected on CT Images: From the Fleischner Society 2017; Radiology 2017; 284:228-243. 3. Small pericardial effusion/thickening. 4. Three-vessel coronary atherosclerosis.  Aortic Atherosclerosis (ICD10-I70.0).   Electronically Signed   By: Rhonda Brewer M.D.   On: 09/03/2016 15:18  OV 12/16/2017  Subjective:  Patient ID: Rhonda Brewer, female , DOB: 1951-01-01 , age 67 y.o. y.o. , MRN: [id number] , ADDRESS: [address] [place] [address]   12/16/2017 -   Chief Complaint  Patient presents with  . Follow-up    Pt is here for a 1 year follow up and states she has been doing well. Pt denies any complaints.     HPI Rhonda Brewer 67 y.o. -1 year follow-up for ILD monitoring in the setting of scleroderma.  She says overall she is stable.  She is no longer on Plaquenil for scleroderma.  She continues to have Raynaud.  She tells me other than extremely mild shortness of breath IV exertion she is stable.  There is no interim medical issues or surgical issues.  No  change in medications of significance.  No ER visits no hospitalizations.  Only interim medical issues carpal tunnel surgery.  She had a CT scan of the chest July 2019.  This is not a high-resolution CT chest.  On the report there is no evidence of ILD.  Last echocardiogram June 2018 without any pulmonary hypertension.  There is no wheezing cough orthopnea proximal nocturnal dyspnea.   Simple office walk 185 feet x  3 laps goal with forehead probe 12/16/2017   O2 used Room air  Number laps completed 3  Comments about pace Normal brisk  Resting Pulse Ox/HR 99% and 65/min  Final Pulse Ox/HR 99% and 74/min  Desaturated </= 88% no  Desaturated <= 3% points no  Got Tachycardic >/= 90/min no  Symptoms at end of test non3  Miscellaneous comments Normal test      ROS - per HPI     has a past medical history of Allergy, Arthritis, CAD (coronary artery disease), Cancer ([place]), Heart murmur, Hyperlipidemia, Osteoporosis, and Thyroid disease.  reports that she has never smoked. She has never used smokeless tobacco.  Past Surgical History:  Procedure Laterality Date  . ABDOMINAL HYSTERECTOMY    . APPENDECTOMY    . CARDIAC ELECTROPHYSIOLOGY MAPPING AND ABLATION  04/19/2003  . CORONARY ANGIOPLASTY WITH STENT PLACEMENT    . RIGHT/LEFT HEART CATH AND CORONARY ANGIOGRAPHY [name] 08/14/2017   Procedure: RIGHT/LEFT HEART CATH AND CORONARY ANGIOGRAPHY;  Surgeon: Jolaine Artist, MD;  Location: [facility];  Service: Cardiovascular;  Laterality: [name];  . ULTRASOUND GUIDANCE FOR VASCULAR ACCESS  08/14/2017   Procedure: Ultrasound Guidance For Vascular Access;  Surgeon: Jolaine Artist, MD;  Location: [facility];  Service: Cardiovascular;;    Allergies  Allergen Reactions  . Avelox [Moxifloxacin Hcl In Nacl] Other (See Comments)    dizziness  . Other Other (See Comments)    SEASONAL     Immunization History  Administered Date(s) Administered  . Influenza Split 01/17/2016  .  Influenza-Unspecified 04/02/2017, 12/08/2017  . Pneumococcal Conjugate-13 10/26/2014  . Pneumococcal-Unspecified 09/14/2013  . Td 07/18/2011    Family History  Problem Relation Age of Onset  . Asthma Maternal Aunt   . Heart disease Mother   . Diabetes Mother   . Heart disease Father   . Stomach cancer Brother   . Colon cancer Neg Hx   . Esophageal cancer Neg Hx   . Rectal cancer Neg Hx      Current Outpatient Medications:  .  aspirin EC 81 MG tablet, Take 81 mg by mouth daily. , Disp: , Rfl:  .  atorvastatin (LIPITOR) 40 MG tablet, Take 1 tablet (40 mg total) by mouth daily., Disp: 30 tablet, Rfl: 5 .  Calcium Carbonate-Vitamin D (CALCIUM-VITAMIN D3 PO), Take 1 tablet by mouth daily., Disp: , Rfl:  .  cycloSPORINE (RESTASIS) 0.05 % ophthalmic emulsion, Place 1 drop into both eyes 2 (two) times daily., Disp: , Rfl:  .  levothyroxine (SYNTHROID) 75 MCG tablet, Take 1 tablet (75 mcg total) by mouth daily before breakfast., Disp: 90 tablet, Rfl: 4 .  Multiple Vitamin (MULTI-VITAMINS) TABS, Take 1 tablet by mouth daily. , Disp: , Rfl:  .  pantoprazole (PROTONIX) 40 MG tablet, TAKE 1 TABLET(40 MG) BY MOUTH DAILY, Disp: 90 tablet, Rfl: 3 .  vitamin B-12 (CYANOCOBALAMIN) 100 MCG tablet, Take 100 mcg by mouth daily., Disp: , Rfl:  .  nitroGLYCERIN (NITROSTAT) 0.4 MG SL tablet, Place 1 tablet (0.4 mg total) under the tongue every 5 (five) minutes as needed for chest pain., Disp: 25 tablet, Rfl: 3      Objective:   Vitals:   12/16/17 1204  BP: 128/76  Pulse: 60  SpO2: 98%  Weight: 134 lb 12.8 oz (61.1 kg)  Height: 5\' 3"  (1.6 m)    Estimated body mass index is 23.88 kg/m as calculated from the following:   Height as of this encounter: 5\' 3"  (1.6 m).   Weight as of this encounter: 134 lb 12.8 oz (61.1 kg).  @WEIGHTCHANGE @  [employer]   12/16/17 1204  Weight: 134 lb 12.8 oz (61.1 kg)     Physical Exam  General Appearance:    Alert, cooperative, no distress, appears  stated age - yes , Deconditioned looking - no , OBESE  - no, Sitting on Wheelchair -  no  Head:    Normocephalic, without obvious abnormality, atraumatic  Eyes:    PERRL, conjunctiva/corneas clear,  Ears:    Normal TM's and external ear canals, both ears  Nose:   Nares normal, septum midline, mucosa normal, no drainage    or sinus tenderness. OXYGEN ON  - no . Patient is @ ra   Throat:   Lips, mucosa, and tongue normal; teeth and gums normal. Cyanosis on lips - no  Neck:   Supple, symmetrical, trachea midline, no adenopathy;    thyroid:  no enlargement/tenderness/nodules; no carotid   bruit or JVD  Back:     Symmetric, no curvature, ROM normal, no CVA tenderness  Lungs:     Distress - no , Wheeze no, Barrell Chest - no, Purse lip breathing - no, Crackles - no   Chest Wall:    No tenderness or deformity.    Heart:    Regular rate and rhythm, S1 and S2 normal, no rub   or gallop, Murmur - no  Breast Exam:    NOT DONE  Abdomen:     Soft, non-tender, bowel sounds active all four quadrants,    no masses, no organomegaly. Visceral obesity - no  Genitalia:   NOT DONE  Rectal:   NOT DONE  Extremities:   Extremities - normal, Has Cane - no, Clubbing - no, Edema - no  Pulses:   2+ and symmetric all extremities  Skin:   Stigmata of Connective Tissue Disease - YES SCLREDOERMA  Lymph nodes:   Cervical, supraclavicular, and axillary nodes normal  Psychiatric:  Neurologic:   Pleasant - yes, Anxious - no, Flat affect - no  CAm-ICU - neg, Alert and Oriented x 3 - yes, Moves all 4s - yes, Speech - normal, Cognition - intact           Assessment:       ICD-10-CM   1. Dyspnea and respiratory abnormalities R06.00    R06.89   2. Scleroderma ([place]) M34.9        Plan:     Patient Instructions     ICD-10-CM   1. Dyspnea and respiratory abnormalities R06.00    R06.89   2. Scleroderma (HCC) M34.9     Mild shortness of breath that is baseline No evidence of ILD disease from scleroderma base  donJuly 2019 CT or exam 12/16/2017 No evidence of pulmonary hypertensio - June 2018 Echo  Plan - do walk test 12/16/2017 - monitor symptoms - Please talk to PCP Hensel, Jamal Collin, MD -  and ensure you get  shingrix ([place]) inactivated vaccine against shingles   followup - 1 year do Pre-bd spiro and dlco only. No lung volume or bd response. No post-bd spiro - return to regular clinic in 1 year or sooner if needed   (> 50% of this 15 min visit spent in face to face counseling or/and coordination of care by this undersigned MD - Dr Brand Males. This includes one or more of the following documented above: discussion of test results, diagnostic or treatment recommendations, prognosis, risks and benefits of management options, instructions, education, compliance or risk-factor reduction)    SIGNATURE    Dr. Brand Males, M.D., F.C.C.P,  Pulmonary and Critical Care Medicine Staff Physician, [place] Director - Interstitial Lung Disease  Program  [facility] [place], [place], [address]  Pager: [phone], If no answer or between  15:00h - 7:00h: call [phone] Telephone: [phone]  12:30 PM 12/16/2017

## 2017-12-18 DIAGNOSIS — M545 Low back pain: Secondary | ICD-10-CM | POA: Diagnosis not present

## 2017-12-18 DIAGNOSIS — G8929 Other chronic pain: Secondary | ICD-10-CM | POA: Diagnosis not present

## 2017-12-25 DIAGNOSIS — G8929 Other chronic pain: Secondary | ICD-10-CM | POA: Diagnosis not present

## 2017-12-25 DIAGNOSIS — M545 Low back pain: Secondary | ICD-10-CM | POA: Diagnosis not present

## 2018-01-12 ENCOUNTER — Ambulatory Visit: Payer: Medicare Other

## 2018-01-13 DIAGNOSIS — G8929 Other chronic pain: Secondary | ICD-10-CM | POA: Diagnosis not present

## 2018-01-13 DIAGNOSIS — M545 Low back pain: Secondary | ICD-10-CM | POA: Diagnosis not present

## 2018-01-20 NOTE — Progress Notes (Addendum)
Office Visit Note  Patient: Rhonda Brewer             Date of Birth: 05-04-1950           MRN: [id number]             PCP: [name], MD Referring: [name], MD Visit Date: 01/21/2018 Occupation: @GUAROCC @  Subjective:  Increased pain and stiffness in bilateral hands  History of Present Illness: Rhonda Brewer is a 67 y.o. female with history of scleroderma, osteopenia, and Raynaud's.  She discontinued Plaquenil per recommendations of Dr. Katy Fitch due to changes in visual field.  She reports that she has been having increased pain and stiffness in bilateral hands.  She states she has noticed mild swelling in both hands.  She states that she has noticed some increased skin tightness in her hands as well.  She states that she has intermittent discomfort in her left knee joint.  She states that the pain is most severe after walking for prolonged periods of time.  She denies any mechanical symptoms or injuries.  She denies any left knee joint swelling.  She states that after walking for long peers of time she ices the knee and rest and her symptoms resolved.  She continues to have symptoms of Raynaud's intermittently.  She denies any digital ulcerations.  She denies any recent rashes, sores in her mouth or nose, or dysphagia. She denies any other concerns at this time.    Activities of Daily Living:  Patient reports morning stiffness for 5-10 minutes.   Patient Reports nocturnal pain.  Difficulty dressing/grooming: Denies Difficulty climbing stairs: Denies Difficulty getting out of chair: Reports Difficulty using hands for taps, buttons, cutlery, and/or writing: Denies  Review of Systems  Constitutional: Negative for fatigue.  HENT: Positive for mouth dryness. Negative for mouth sores, trouble swallowing, trouble swallowing and nose dryness.   Eyes: Negative for pain, redness, itching, visual disturbance and dryness.  Respiratory: Negative for cough, hemoptysis, shortness of  breath, wheezing and difficulty breathing.   Cardiovascular: Negative for chest pain, palpitations, hypertension and swelling in legs/feet.  Gastrointestinal: Negative for abdominal pain, blood in stool, constipation, diarrhea, nausea and vomiting.  Endocrine: Negative for increased urination.  Genitourinary: Negative for painful urination, nocturia and pelvic pain.  Musculoskeletal: Positive for arthralgias, joint pain and morning stiffness. Negative for joint swelling, myalgias, muscle weakness, muscle tenderness and myalgias.  Skin: Negative for color change, pallor, rash, hair loss, nodules/bumps, skin tightness, ulcers and sensitivity to sunlight.  Allergic/Immunologic: Negative for susceptible to infections.  Neurological: Negative for dizziness, light-headedness, numbness, headaches, memory loss and weakness.  Hematological: Negative for swollen glands.  Psychiatric/Behavioral: Negative for depressed mood, confusion and sleep disturbance. The patient is not nervous/anxious.     PMFS History:  Patient Active Problem List   Diagnosis Date Noted  . Carpal tunnel syndrome 05/29/2017  . Chronic low back pain 04/29/2017  . Screening for breast cancer 09/04/2016  . Pes anserinus bursitis of left knee 09/04/2016  . Dyspnea and respiratory abnormalities 08/16/2016  . LUQ abdominal pain 06/26/2016  . H/O cardiac radiofrequency ablation 06/26/2016  . CAD (coronary artery disease), native coronary artery 06/19/2016  . History of hypothyroidism 06/19/2016  . History of uterine cancer 06/19/2016  . Scleroderma ([place]) 06/16/2016  . High risk medication use 06/16/2016  . Osteopenia of multiple sites 06/16/2016    Past Medical History:  Diagnosis Date  . Allergy   . Arthritis   . CAD (coronary artery  disease)    a. DES to RCA 11/2015  . Cancer [facility])    Uterine 2008  . Heart murmur   . Hyperlipidemia   . Osteoporosis   . Thyroid disease     Family History  Problem Relation Age of Onset   . Asthma Maternal Aunt   . Heart disease Mother   . Diabetes Mother   . Heart disease Father   . Stomach cancer Brother   . Colon cancer Neg Hx   . Esophageal cancer Neg Hx   . Rectal cancer Neg Hx    Past Surgical History:  Procedure Laterality Date  . ABDOMINAL HYSTERECTOMY    . APPENDECTOMY    . CARDIAC ELECTROPHYSIOLOGY MAPPING AND ABLATION  04/19/2003  . CARPAL TUNNEL RELEASE Right 10/15/2017  . CORONARY ANGIOPLASTY WITH STENT PLACEMENT    . RIGHT/LEFT HEART CATH AND CORONARY ANGIOGRAPHY [name] 08/14/2017   Procedure: RIGHT/LEFT HEART CATH AND CORONARY ANGIOGRAPHY;  Surgeon: Jolaine Artist, MD;  Location: [facility];  Service: Cardiovascular;  Laterality: [name];  . ULTRASOUND GUIDANCE FOR VASCULAR ACCESS  08/14/2017   Procedure: Ultrasound Guidance For Vascular Access;  Surgeon: Jolaine Artist, MD;  Location: [facility];  Service: Cardiovascular;;   Social History   Social History Narrative  . Not on file    Objective: Vital Signs: BP 115/68 (BP Location: Left Arm, Patient Position: Sitting, Cuff Size: Normal)   Pulse 77   Resp 12   Ht 5\' 3"  (1.6 m)   Wt 138 lb (62.6 kg)   BMI 24.45 kg/m    Physical Exam  Constitutional: She is oriented to person, place, and time. She appears well-developed and well-nourished.  HENT:  Head: Normocephalic and atraumatic.  Eyes: Conjunctivae and EOM are normal.  Neck: Normal range of motion.  Cardiovascular: Normal rate, regular rhythm, normal heart sounds and intact distal pulses.  Pulmonary/Chest: Effort normal and breath sounds normal.  Abdominal: Soft. Bowel sounds are normal.  Lymphadenopathy:    She has no cervical adenopathy.  Neurological: She is alert and oriented to person, place, and time.  Skin: Skin is warm and dry. Capillary refill takes less than 2 seconds.  Sclerodactyly noted on bilateral hands.   Psychiatric: She has a normal mood and affect. Her behavior is normal.  Nursing note and vitals  reviewed.    Musculoskeletal Exam: C-spine, thoracic spine, and lumbar spine good ROM.  No midline spinal tenderness.  No SI joint tenderness.  Shoulder joints, elbow joints, wrist joints good ROM with no synovitis.  She has tenderness of the right wrist joint.  Tenderness as described below.  No synovitis noted. Hip joints, knee joints, ankle joints, MTPs, PIPs, and DIPs good ROM with no synovitis.  No warmth or effusion of knee joints.  No tenderness or swelling of ankle joints.  No achilles tendonitis or plantar fasciitis.  No tenderness of trochanteric bursa bilaterally.   CDAI Exam: CDAI Score: Not documented Patient Global Assessment: Not documented; Provider Global Assessment: Not documented Swollen: 0 ; Tender: 7  Joint Exam      Right  Left  Wrist   Tender     MCP 1   Tender   Tender  MCP 2   Tender   Tender  MCP 3   Tender     PIP 3   Tender        Investigation: No additional findings.  Imaging: No results found.  Recent Labs: Lab Results  Component Value Date  WBC 6.3 08/14/2017   HGB 14.9 08/14/2017   PLT 196 08/14/2017   NA 140 08/14/2017   K 4.3 08/14/2017   CL 108 08/14/2017   CO2 25 08/14/2017   GLUCOSE 91 08/14/2017   BUN 13 08/14/2017   CREATININE 0.76 08/14/2017   BILITOT 0.5 05/01/2017   ALKPHOS 64 06/19/2016   AST 29 05/01/2017   ALT 31 (H) 05/01/2017   PROT 7.0 05/01/2017   ALBUMIN 4.4 06/19/2016   CALCIUM 9.5 08/14/2017   GFRAA >60 08/14/2017    Speciality Comments: PLQ Eye Exam: 08/07/17 PLQ toxcity,  PLQ discotinued @ [facility]  Procedures:  No procedures performed Allergies: Avelox [moxifloxacin hcl in nacl] and Other      Assessment / Plan:     Visit Diagnoses: Scleroderma ([place]) -  Limited systemic with Raynauds, Telengectesia's, sclerodactyly, arthralgias, erosions in right fifth and left third DIP, ANA centromere: She has skin thickening in bilateral hands but there is no progression in the skin tightness.  She has no  synovitis on exam today.  She has been having increased pain in bilateral hands but no joint swelling was noted.  She has been off of Plaquenil since May 2019 due to recommendations made by Dr. [name] findings.  She continues to have intermittent symptoms of Raynaud's but no digital ulcerations were noted.  She denies any symptoms of dysphagia.  She denies any shortness of breath or chest pain at this time.  Patient saw Dr. Chase Caller on 12/16/2017 for one-year follow-up of ILD monitoring.  A CT scan of the chest was performed in July 2019 but it was not high resolution.  On the CT scan there is no evidence of ILD.  She had echocardiogram in June 2018 that did not reveal findings consistent with pulmonary hypertension.  She continues to follow-up with Dr. Haroldine Laws regularly as well.  CBC, CMP, and UA will be checked today.  She will follow-up in the office in 5 months.  She was advised to notify us of any new or worsening symptoms.- Plan: COMPLETE METABOLIC PANEL WITH GFR, CBC with Differential/Platelet, Urinalysis, Routine w reflex microscopic  High risk medication use - Dr. Katy Fitch advised patient to stop taking PLQ.  She has been off of Plaquenil since May 2019.  Raynaud's phenomenon without gangrene: She continues to have intersegmental and symptoms of Raynolds.  She has no digital ulcerations on exam.  We discussed the importance of keeping her core body temperature warm and avoiding triggers.  Thrombocytopenia ([place]): CBC was ordered today.  Platelet count was 196 on 08/14/2017.  Osteopenia of multiple sites - DEXA 6/2018t score -1.6 left femur.  She continues to take calcium and vitamin D on a daily basis.  Other medical conditions are listed as follows:  History of uterine cancer - 2008, status post hysterectomy, chemotherapy, radiation therapy    History of hypothyroidism  History of coronary artery disease  H/O cardiac radiofrequency ablation   Orders: Orders Placed This Encounter    Procedures  . COMPLETE METABOLIC PANEL WITH GFR  . CBC with Differential/Platelet  . Urinalysis, Routine w reflex microscopic   No orders of the defined types were placed in this encounter.   Follow-Up Instructions: Return in about 5 months (around 06/22/2018) for Scleroderma, Raynaud's syndrome.   Ofilia Neas, PA-C   I examined and evaluated the patient with Hazel Sams PA. The plan of care was discussed as noted above.  Bo Merino, MD  Note - This record has been created  using Editor, commissioning.  Chart creation errors have been sought, but may not always  have been located. Such creation errors do not reflect on  the standard of medical care.

## 2018-01-21 ENCOUNTER — Ambulatory Visit (INDEPENDENT_AMBULATORY_CARE_PROVIDER_SITE_OTHER): Payer: Medicare Other | Admitting: Physician Assistant

## 2018-01-21 ENCOUNTER — Encounter: Payer: Self-pay | Admitting: Physician Assistant

## 2018-01-21 VITALS — BP 115/68 | HR 77 | Resp 12 | Ht 63.0 in | Wt 138.0 lb

## 2018-01-21 DIAGNOSIS — Z9889 Other specified postprocedural states: Secondary | ICD-10-CM

## 2018-01-21 DIAGNOSIS — Z8679 Personal history of other diseases of the circulatory system: Secondary | ICD-10-CM

## 2018-01-21 DIAGNOSIS — Z8639 Personal history of other endocrine, nutritional and metabolic disease: Secondary | ICD-10-CM | POA: Diagnosis not present

## 2018-01-21 DIAGNOSIS — M8589 Other specified disorders of bone density and structure, multiple sites: Secondary | ICD-10-CM | POA: Diagnosis not present

## 2018-01-21 DIAGNOSIS — Z8542 Personal history of malignant neoplasm of other parts of uterus: Secondary | ICD-10-CM | POA: Diagnosis not present

## 2018-01-21 DIAGNOSIS — Z79899 Other long term (current) drug therapy: Secondary | ICD-10-CM | POA: Diagnosis not present

## 2018-01-21 DIAGNOSIS — I7300 Raynaud's syndrome without gangrene: Secondary | ICD-10-CM

## 2018-01-21 DIAGNOSIS — M349 Systemic sclerosis, unspecified: Secondary | ICD-10-CM | POA: Diagnosis not present

## 2018-01-21 DIAGNOSIS — D696 Thrombocytopenia, unspecified: Secondary | ICD-10-CM

## 2018-01-22 DIAGNOSIS — G8929 Other chronic pain: Secondary | ICD-10-CM | POA: Diagnosis not present

## 2018-01-22 DIAGNOSIS — M545 Low back pain: Secondary | ICD-10-CM | POA: Diagnosis not present

## 2018-01-22 LAB — CBC WITH DIFFERENTIAL/PLATELET
BASOS ABS: 33 {cells}/uL (ref 0–200)
Basophils Relative: 0.5 %
EOS PCT: 1.8 %
Eosinophils Absolute: 119 cells/uL (ref 15–500)
HEMATOCRIT: 40.8 % (ref 35.0–45.0)
Hemoglobin: 14 g/dL (ref 11.7–15.5)
LYMPHS ABS: 2224 {cells}/uL (ref 850–3900)
MCH: 32.1 pg (ref 27.0–33.0)
MCHC: 34.3 g/dL (ref 32.0–36.0)
MCV: 93.6 fL (ref 80.0–100.0)
MPV: 11.1 fL (ref 7.5–12.5)
Monocytes Relative: 8.7 %
NEUTROS PCT: 55.3 %
Neutro Abs: 3650 cells/uL (ref 1500–7800)
PLATELETS: 174 10*3/uL (ref 140–400)
RBC: 4.36 10*6/uL (ref 3.80–5.10)
RDW: 12 % (ref 11.0–15.0)
TOTAL LYMPHOCYTE: 33.7 %
WBC mixed population: 574 cells/uL (ref 200–950)
WBC: 6.6 10*3/uL (ref 3.8–10.8)

## 2018-01-22 LAB — URINALYSIS, ROUTINE W REFLEX MICROSCOPIC
BILIRUBIN URINE: NEGATIVE
Glucose, UA: NEGATIVE
Hgb urine dipstick: NEGATIVE
KETONES UR: NEGATIVE
Leukocytes, UA: NEGATIVE
NITRITE: NEGATIVE
Protein, ur: NEGATIVE
SPECIFIC GRAVITY, URINE: 1.022 (ref 1.001–1.03)
pH: <=5 (ref 5.0–8.0)

## 2018-01-22 LAB — COMPLETE METABOLIC PANEL WITH GFR
AG Ratio: 2 (calc) (ref 1.0–2.5)
ALBUMIN MSPROF: 4.3 g/dL (ref 3.6–5.1)
ALKALINE PHOSPHATASE (APISO): 79 U/L (ref 33–130)
ALT: 41 U/L — ABNORMAL HIGH (ref 6–29)
AST: 38 U/L — ABNORMAL HIGH (ref 10–35)
BUN: 18 mg/dL (ref 7–25)
CO2: 22 mmol/L (ref 20–32)
CREATININE: 0.75 mg/dL (ref 0.50–0.99)
Calcium: 9.5 mg/dL (ref 8.6–10.4)
Chloride: 110 mmol/L (ref 98–110)
GFR, Est African American: 96 mL/min/{1.73_m2} (ref >=60)
GFR, Est Non African American: 82 mL/min/{1.73_m2} (ref >=60)
GLUCOSE: 93 mg/dL (ref 65–99)
Globulin: 2.2 g/dL (calc) (ref 1.9–3.7)
Potassium: 4.4 mmol/L (ref 3.5–5.3)
SODIUM: 141 mmol/L (ref 135–146)
Total Bilirubin: 0.5 mg/dL (ref 0.2–1.2)
Total Protein: 6.5 g/dL (ref 6.1–8.1)

## 2018-01-22 NOTE — Progress Notes (Signed)
LFTs elevated. Please forward to PCP. All other labs are WNL.

## 2018-01-29 DIAGNOSIS — M545 Low back pain: Secondary | ICD-10-CM | POA: Diagnosis not present

## 2018-01-29 DIAGNOSIS — G8929 Other chronic pain: Secondary | ICD-10-CM | POA: Diagnosis not present

## 2018-02-04 DIAGNOSIS — G8929 Other chronic pain: Secondary | ICD-10-CM | POA: Diagnosis not present

## 2018-02-04 DIAGNOSIS — M545 Low back pain: Secondary | ICD-10-CM | POA: Diagnosis not present

## 2018-02-17 ENCOUNTER — Ambulatory Visit: Payer: Medicare Other | Admitting: Physician Assistant

## 2018-02-23 DIAGNOSIS — M545 Low back pain: Secondary | ICD-10-CM | POA: Diagnosis not present

## 2018-02-23 DIAGNOSIS — G8929 Other chronic pain: Secondary | ICD-10-CM | POA: Diagnosis not present

## 2018-02-24 ENCOUNTER — Ambulatory Visit: Payer: Medicare Other

## 2018-03-03 ENCOUNTER — Ambulatory Visit: Payer: Medicare Other

## 2018-03-09 ENCOUNTER — Ambulatory Visit (INDEPENDENT_AMBULATORY_CARE_PROVIDER_SITE_OTHER): Payer: Medicare Other

## 2018-03-09 VITALS — BP 116/70 | HR 69 | Temp 98.3°F | Ht 63.0 in | Wt 139.0 lb

## 2018-03-09 DIAGNOSIS — Z0000 Encounter for general adult medical examination without abnormal findings: Secondary | ICD-10-CM | POA: Diagnosis not present

## 2018-03-09 NOTE — Progress Notes (Signed)
Patient ID: Rhonda Brewer, female   DOB: 12-22-1950, 67 y.o.   MRN: [id number] I have reviewed this visit and agree with the documentation.

## 2018-03-09 NOTE — Patient Instructions (Addendum)
Rhonda Brewer , Thank you for taking time to come for your Medicare Wellness Visit. I appreciate your ongoing commitment to your health goals. Please review the following plan we discussed and let me know if I can assist you in the future.   These are the goals we discussed: Goals    . Patient Stated     Work with a Physiological scientist at [employer] for improved health.       This is a list of the screening recommended for you and due dates:  Health Maintenance  Topic Date Due  . Pneumonia vaccines (2 of 2 - PPSV23) 09/15/2018  . Mammogram  01/11/2019  . Tetanus Vaccine  07/17/2021  . Colon Cancer Screening  02/06/2023  . Flu Shot  Completed  . DEXA scan (bone density measurement)  Completed  .  Hepatitis C: One time screening is recommended by Center for Disease Control  (CDC) for  adults born from [birth year] through 1965.   Completed    Health Maintenance, Female Adopting a healthy lifestyle and getting preventive care can go a long way to promote health and wellness. Talk with your health care provider about what schedule of regular examinations is right for you. This is a good chance for you to check in with your provider about disease prevention and staying healthy. In between checkups, there are plenty of things you can do on your own. Experts have done a lot of research about which lifestyle changes and preventive measures are most likely to keep you healthy. Ask your health care provider for more information. Weight and diet Eat a healthy diet  Be sure to include plenty of vegetables, fruits, low-fat dairy products, and lean protein.  Do not eat a lot of foods high in solid fats, added sugars, or salt.  Get regular exercise. This is one of the most important things you can do for your health. ? Most adults should exercise for at least 150 minutes each week. The exercise should increase your heart rate and make you sweat (moderate-intensity exercise). ? Most adults should also do  strengthening exercises at least twice a week. This is in addition to the moderate-intensity exercise. Maintain a healthy weight  Body mass index (BMI) is a measurement that can be used to identify possible weight problems. It estimates body fat based on height and weight. Your health care provider can help determine your BMI and help you achieve or maintain a healthy weight.  For females 60 years of age and older: ? A BMI below 18.5 is considered underweight. ? A BMI of 18.5 to 24.9 is normal. ? A BMI of 25 to 29.9 is considered overweight. ? A BMI of 30 and above is considered obese. Watch levels of cholesterol and blood lipids  You should start having your blood tested for lipids and cholesterol at 67 years of age, then have this test every 5 years.  You may need to have your cholesterol levels checked more often if: ? Your lipid or cholesterol levels are high. ? You are older than 67 years of age. ? You are at high risk for heart disease. Cancer screening Lung Cancer  Lung cancer screening is recommended for adults 11-70 years old who are at high risk for lung cancer because of a history of smoking.  A yearly low-dose CT scan of the lungs is recommended for people who: ? Currently smoke. ? Have quit within the past 15 years. ? Have at least a 30-pack-year history  of smoking. A pack year is smoking an average of one pack of cigarettes a day for 1 year.  Yearly screening should continue until it has been 15 years since you quit.  Yearly screening should stop if you develop a health problem that would prevent you from having lung cancer treatment. Breast Cancer  Practice breast self-awareness. This means understanding how your breasts normally appear and feel.  It also means doing regular breast self-exams. Let your health care provider know about any changes, no matter how small.  If you are in your 20s or 30s, you should have a clinical breast exam (CBE) by a health care  provider every 1-3 years as part of a regular health exam.  If you are 54 or older, have a CBE every year. Also consider having a breast X-ray (mammogram) every year.  If you have a family history of breast cancer, talk to your health care provider about genetic screening.  If you are at high risk for breast cancer, talk to your health care provider about having an MRI and a mammogram every year.  Breast cancer gene (BRCA) assessment is recommended for women who have family members with BRCA-related cancers. BRCA-related cancers include: ? Breast. ? Ovarian. ? Tubal. ? Peritoneal cancers.  Results of the assessment will determine the need for genetic counseling and BRCA1 and BRCA2 testing. Cervical Cancer Your health care provider may recommend that you be screened regularly for cancer of the pelvic organs (ovaries, uterus, and vagina). This screening involves a pelvic examination, including checking for microscopic changes to the surface of your cervix (Pap test). You may be encouraged to have this screening done every 3 years, beginning at age 41.  For women ages 76-65, health care providers may recommend pelvic exams and Pap testing every 3 years, or they may recommend the Pap and pelvic exam, combined with testing for human papilloma virus (HPV), every 5 years. Some types of HPV increase your risk of cervical cancer. Testing for HPV may also be done on women of any age with unclear Pap test results.  Other health care providers may not recommend any screening for nonpregnant women who are considered low risk for pelvic cancer and who do not have symptoms. Ask your health care provider if a screening pelvic exam is right for you.  If you have had past treatment for cervical cancer or a condition that could lead to cancer, you need Pap tests and screening for cancer for at least 20 years after your treatment. If Pap tests have been discontinued, your risk factors (such as having a new sexual  partner) need to be reassessed to determine if screening should resume. Some women have medical problems that increase the chance of getting cervical cancer. In these cases, your health care provider may recommend more frequent screening and Pap tests. Colorectal Cancer  This type of cancer can be detected and often prevented.  Routine colorectal cancer screening usually begins at 67 years of age and continues through 67 years of age.  Your health care provider may recommend screening at an earlier age if you have risk factors for colon cancer.  Your health care provider may also recommend using home test kits to check for hidden blood in the stool.  A small camera at the end of a tube can be used to examine your colon directly (sigmoidoscopy or colonoscopy). This is done to check for the earliest forms of colorectal cancer.  Routine screening usually begins at age 69.  Direct examination of the colon should be repeated every 5-10 years through 67 years of age. However, you may need to be screened more often if early forms of precancerous polyps or small growths are found. Skin Cancer  Check your skin from head to toe regularly.  Tell your health care provider about any new moles or changes in moles, especially if there is a change in a mole's shape or color.  Also tell your health care provider if you have a mole that is larger than the size of a pencil eraser.  Always use sunscreen. Apply sunscreen liberally and repeatedly throughout the day.  Protect yourself by wearing long sleeves, pants, a wide-brimmed hat, and sunglasses whenever you are outside. Heart disease, diabetes, and high blood pressure  High blood pressure causes heart disease and increases the risk of stroke. High blood pressure is more likely to develop in: ? People who have blood pressure in the high end of the normal range (130-139/85-89 mm Hg). ? People who are overweight or obese. ? People who are African  American.  If you are 57-33 years of age, have your blood pressure checked every 3-5 years. If you are 37 years of age or older, have your blood pressure checked every year. You should have your blood pressure measured twice-once when you are at a hospital or clinic, and once when you are not at a hospital or clinic. Record the average of the two measurements. To check your blood pressure when you are not at a hospital or clinic, you can use: ? An automated blood pressure machine at a pharmacy. ? A home blood pressure monitor.  If you are between 31 years and 23 years old, ask your health care provider if you should take aspirin to prevent strokes.  Have regular diabetes screenings. This involves taking a blood sample to check your fasting blood sugar level. ? If you are at a normal weight and have a low risk for diabetes, have this test once every three years after 67 years of age. ? If you are overweight and have a high risk for diabetes, consider being tested at a younger age or more often. Preventing infection Hepatitis B  If you have a higher risk for hepatitis B, you should be screened for this virus. You are considered at high risk for hepatitis B if: ? You were born in a country where hepatitis B is common. Ask your health care provider which countries are considered high risk. ? Your parents were born in a high-risk country, and you have not been immunized against hepatitis B (hepatitis B vaccine). ? You have HIV or AIDS. ? You use needles to inject street drugs. ? You live with someone who has hepatitis B. ? You have had sex with someone who has hepatitis B. ? You get hemodialysis treatment. ? You take certain medicines for conditions, including cancer, organ transplantation, and autoimmune conditions. Hepatitis C  Blood testing is recommended for: ? Everyone born from [birth year] through 1965. ? Anyone with known risk factors for hepatitis C. Sexually transmitted infections  (STIs)  You should be screened for sexually transmitted infections (STIs) including gonorrhea and chlamydia if: ? You are sexually active and are younger than 67 years of age. ? You are older than 67 years of age and your health care provider tells you that you are at risk for this type of infection. ? Your sexual activity has changed since you were last screened and you are at an  increased risk for chlamydia or gonorrhea. Ask your health care provider if you are at risk.  If you do not have HIV, but are at risk, it may be recommended that you take a prescription medicine daily to prevent HIV infection. This is called pre-exposure prophylaxis (PrEP). You are considered at risk if: ? You are sexually active and do not regularly use condoms or know the HIV status of your partner(s). ? You take drugs by injection. ? You are sexually active with a partner who has HIV. Talk with your health care provider about whether you are at high risk of being infected with HIV. If you choose to begin PrEP, you should first be tested for HIV. You should then be tested every 3 months for as long as you are taking PrEP. Pregnancy  If you are premenopausal and you may become pregnant, ask your health care provider about preconception counseling.  If you may become pregnant, take 400 to 800 micrograms (mcg) of folic acid every day.  If you want to prevent pregnancy, talk to your health care provider about birth control (contraception). Osteoporosis and menopause  Osteoporosis is a disease in which the bones lose minerals and strength with aging. This can result in serious bone fractures. Your risk for osteoporosis can be identified using a bone density scan.  If you are 9 years of age or older, or if you are at risk for osteoporosis and fractures, ask your health care provider if you should be screened.  Ask your health care provider whether you should take a calcium or vitamin D supplement to lower your risk  for osteoporosis.  Menopause may have certain physical symptoms and risks.  Hormone replacement therapy may reduce some of these symptoms and risks. Talk to your health care provider about whether hormone replacement therapy is right for you. Follow these instructions at home:  Schedule regular health, dental, and eye exams.  Stay current with your immunizations.  Do not use any tobacco products including cigarettes, chewing tobacco, or electronic cigarettes.  If you are pregnant, do not drink alcohol.  If you are breastfeeding, limit how much and how often you drink alcohol.  Limit alcohol intake to no more than 1 drink per day for nonpregnant women. One drink equals 12 ounces of beer, 5 ounces of wine, or 1 ounces of hard liquor.  Do not use street drugs.  Do not share needles.  Ask your health care provider for help if you need support or information about quitting drugs.  Tell your health care provider if you often feel depressed.  Tell your health care provider if you have ever been abused or do not feel safe at home. This information is not intended to replace advice given to you by your health care provider. Make sure you discuss any questions you have with your health care provider. Document Released: 09/17/2010 Document Revised: 08/10/2015 Document Reviewed: 12/06/2014 Elsevier Interactive Patient Education  2019 [employer].

## 2018-03-09 NOTE — Progress Notes (Signed)
Subjective:   Rhonda Brewer is a 67 y.o. female who presents for an Initial Medicare Annual Wellness Visit.  Review of Systems    Physical assessment deferred to PCP.  Cardiac Risk Factors include: advanced age (>60men, >68 women);dyslipidemia    Objective:    Today's Vitals   03/09/18 1046  BP: 116/70  Pulse: 69  Temp: 98.3 F (36.8 C)  TempSrc: Oral  SpO2: 98%  Weight: 139 lb (63 kg)  Height: 5\' 3"  (1.6 m)  PainSc: 0-No pain   Body mass index is 24.62 kg/m.  Advanced Directives 03/09/2018 08/14/2017 03/20/2017 01/03/2017 10/31/2016 09/04/2016 07/25/2016  Does Patient Have a Medical Advance Directive? Yes Yes No Yes Yes No Yes  Type of Paramedic of [place];Living will [place] will;Healthcare Power of Attorney Living will;Healthcare Power of Attorney  Does patient want to make changes to medical advance directive? - No - Patient declined - - - - -  Copy of [place] in Chart? No - copy requested No - copy requested - - No - copy requested No - copy requested No - copy requested  Would patient like information on creating a medical advance directive? - - No - Patient declined - - - -    Current Medications (verified) Outpatient Encounter Medications as of 03/09/2018  Medication Sig  . aspirin EC 81 MG tablet Take 81 mg by mouth daily.   Marland Kitchen atorvastatin (LIPITOR) 40 MG tablet Take 1 tablet (40 mg total) by mouth daily.  . Calcium Carbonate-Vitamin D (CALCIUM-VITAMIN D3 PO) Take 1 tablet by mouth daily.  . cycloSPORINE (RESTASIS) 0.05 % ophthalmic emulsion Place 1 drop into both eyes 2 (two) times daily.  Marland Kitchen levothyroxine (SYNTHROID) 75 MCG tablet Take 1 tablet (75 mcg total) by mouth daily before breakfast.  . Multiple Vitamin (MULTI-VITAMINS) TABS Take 1 tablet by mouth daily.   . nitroGLYCERIN (NITROSTAT) 0.4 MG SL tablet Place 1  tablet (0.4 mg total) under the tongue every 5 (five) minutes as needed for chest pain.  . pantoprazole (PROTONIX) 40 MG tablet TAKE 1 TABLET(40 MG) BY MOUTH DAILY  . vitamin B-12 (CYANOCOBALAMIN) 100 MCG tablet Take 100 mcg by mouth daily.   No facility-administered encounter medications on file as of 03/09/2018.     Allergies (verified) Avelox [moxifloxacin hcl in nacl] and Other   History: Past Medical History:  Diagnosis Date  . Allergy   . Arthritis   . CAD (coronary artery disease)    a. DES to RCA 11/2015  . Cancer [facility])    Uterine 2008  . Heart murmur   . Hyperlipidemia   . Osteoporosis   . Thyroid disease    Past Surgical History:  Procedure Laterality Date  . ABDOMINAL HYSTERECTOMY    . APPENDECTOMY    . CARDIAC ELECTROPHYSIOLOGY MAPPING AND ABLATION  04/19/2003  . CARPAL TUNNEL RELEASE Right 10/15/2017  . CORONARY ANGIOPLASTY WITH STENT PLACEMENT    . RIGHT/LEFT HEART CATH AND CORONARY ANGIOGRAPHY [name] 08/14/2017   Procedure: RIGHT/LEFT HEART CATH AND CORONARY ANGIOGRAPHY;  Surgeon: Jolaine Artist, MD;  Location: [place] [facility];  Service: Cardiovascular;  Laterality: [name];  . ULTRASOUND GUIDANCE FOR VASCULAR ACCESS  08/14/2017   Procedure: Ultrasound Guidance For Vascular Access;  Surgeon: Jolaine Artist, MD;  Location: [place] [facility];  Service: Cardiovascular;;   Family History  Problem Relation Age of Onset  .  Asthma Maternal Aunt   . Heart disease Mother   . Diabetes Mother   . Heart disease Father   . Stomach cancer Brother   . Diabetes Brother   . Colon cancer Neg Hx   . Esophageal cancer Neg Hx   . Rectal cancer Neg Hx    Social History   Socioeconomic History  . Marital status: Married    Spouse name: Cay Schillings  . Number of children: 2  . Years of education: 25  . Highest education level: 12th grade  Occupational History  . Occupation: retired    Comment: Scientist, water quality  Social Needs  . Financial resource strain: Not hard  at all  . Food insecurity:    Worry: Never true    Inability: Never true  . Transportation needs:    Medical: No    Non-medical: No  Tobacco Use  . Smoking status: Never Smoker  . Smokeless tobacco: Never Used  Substance and Sexual Activity  . Alcohol use: Yes    Comment: occasional wine  . Drug use: No  . Sexual activity: Yes    Birth control/protection: Surgical    Comment: hysterectomy  Lifestyle  . Physical activity:    Days per week: 5 days    Minutes per session: 90 min  . Stress: Not at all  Relationships  . Social connections:    Talks on phone: More than three times a week    Gets together: More than three times a week    Attends religious service: Never    Active member of club or organization: Yes    Attends meetings of clubs or organizations: More than 4 times per year    Relationship status: Married  Other Topics Concern  . Not on file  Social History Narrative   Lives in home with husband. Daughters live out of state. Step-daughter in [place]. Has 8 grandchildren. Lives in 2 level home but most living is on first floor. Stairs have handrails, no problem with stairs. No grab bars in bathroom. No tripping hazards. Smoke alarms present.   No pets.    Eats a good variety of foods, husband is a cook. Eats meats, fruits, vegetables. Drinks water mostly. Occasional tea.   Goes to gym 5 times a week. Enjoys reading, sewing, movies. Walking when weather is nice.    Wears seat belt in car.     Tobacco Counseling Counseling given: Yes   Clinical Intake:  Pre-visit preparation completed: Yes  Pain Score: 0-No pain    Nutritional Status: BMI of 19-24  Normal Nutritional Risks: None Diabetes: No  How often do you need to have someone help you when you read instructions, pamphlets, or other written materials from your doctor or pharmacy?: 1 - Never What is the last grade level you completed in school?: high school  Interpreter Needed?: No    Activities of Daily  Living In your present state of health, do you have any difficulty performing the following activities: 03/09/2018 08/14/2017  Hearing? N N  Vision? N N  Difficulty concentrating or making decisions? N N  Walking or climbing stairs? N N  Dressing or bathing? N N  Doing errands, shopping? N -  Preparing Food and eating ? N -  Using the Toilet? N -  In the past six months, have you accidently leaked urine? N -  Do you have problems with loss of bowel control? N -  Managing your Medications? N -  Managing your Finances? N -  Housekeeping or managing your Housekeeping? N -  Some recent data might be hidden    Immunizations and Health Maintenance Immunization History  Administered Date(s) Administered  . Influenza Split 01/17/2016  . Influenza-Unspecified 04/02/2017, 12/08/2017  . Pneumococcal Conjugate-13 10/26/2014  . Pneumococcal-Unspecified 09/14/2013  . Td 07/18/2011   There are no preventive care reminders to display for this patient.  Patient Care Team: [name], MD as PCP - General (Family Medicine) Bo Merino, MD as Consulting Physician (Rheumatology) Bensimhon, Shaune Pascal, MD as Consulting Physician (Cardiology) Mauri Pole, MD as Consulting Physician (Gastroenterology) Brand Males, MD as Consulting Physician (Pulmonary Disease) Warden Fillers, MD as Consulting Physician (Ophthalmology) Royston Sinner Colin Benton, MD as Consulting Physician (Obstetrics and Gynecology)  Indicate any recent Medical Services you may have received from other than [facility] providers in the past year (date may be approximate).     Assessment:   This is a routine wellness examination for [facility].  Hearing/Vision screen  Hearing Screening   Method: Audiometry   125Hz  250Hz  500Hz  1000Hz  2000Hz  3000Hz  4000Hz  6000Hz  8000Hz   Right ear:  Pass Pass Pass Pass  Pass    Left ear:  Pass Pass Pass Pass  Pass    Vision Screening Comments: Wears readers. Has had eye exam this  year.   Dietary issues and exercise activities discussed: Current Exercise Habits: Structured exercise class, Type of exercise: strength training/weights;yoga;walking;calisthenics, Time (Minutes): > 60, Frequency (Times/Week): 5, Weekly Exercise (Minutes/Week): 0, Intensity: Moderate, Exercise limited by: None identified  Goals    . Patient Stated     Work with a Physiological scientist at [employer] for improved health.      Depression Screen PHQ 2/9 Scores 03/09/2018 03/20/2017 10/31/2016 09/04/2016 07/25/2016 06/26/2016  PHQ - 2 Score 0 0 0 0 0 0    Fall Risk Fall Risk  03/09/2018 03/20/2017 10/31/2016 09/04/2016 07/25/2016  Falls in the past year? 0 No No No No    Is the patient's home free of loose throw rugs in walkways, pet beds, electrical cords, etc?   yes      Grab bars in the bathroom? no      Handrails on the stairs?   yes      Adequate lighting?   yes  Cognitive Function: MMSE - Mini Mental State Exam 03/09/2018  Orientation to time 5  Orientation to Place 5  Registration 3  Attention/ Calculation 5  Recall 3  Language- name 2 objects 2  Language- repeat 1  Language- follow 3 step command 3  Language- read & follow direction 1  Write a sentence 1  Copy design 1  Total score 30     6CIT Screen 03/09/2018  What Year? 0 points  What month? 0 points  What time? 0 points  Count back from 20 0 points  Months in reverse 0 points  Repeat phrase 0 points  Total Score 0    Screening Tests Health Maintenance  Topic Date Due  . PNA vac Low Risk Adult (2 of 2 - PPSV23) 09/15/2018  . MAMMOGRAM  01/11/2019  . TETANUS/TDAP  07/17/2021  . COLONOSCOPY  02/06/2023  . INFLUENZA VACCINE  Completed  . DEXA SCAN  Completed  . Hepatitis C Screening  Completed    Cancer Screenings: Lung: Low Dose CT Chest recommended if Age 36-80 years, 30 pack-year currently smoking OR have quit w/in 15years. Patient does not qualify. Breast: Up to date on Mammogram? Yes  Scheduled for January  2020. Up to date  of Bone Density/Dexa? Yes Colorectal: up to date   Additional Screenings:  Hepatitis C Screening: complete     Plan:  All health maintenance items up to date. Has mammogram scheduled for January 2020. No needs identified at this time.   I have personally reviewed and noted the following in the patient's chart:   . Medical and social history . Use of alcohol, tobacco or illicit drugs  . Current medications and supplements . Functional ability and status . Nutritional status . Physical activity . Advanced directives . List of other physicians . Hospitalizations, surgeries, and ER visits in previous 12 months . Vitals . Screenings to include cognitive, depression, and falls . Referrals and appointments  In addition, I have reviewed and discussed with patient certain preventive protocols, quality metrics, and best practice recommendations. A written personalized care plan for preventive services as well as general preventive health recommendations were provided to patient.     Esau Grew, RN   03/09/2018

## 2018-03-23 ENCOUNTER — Encounter (HOSPITAL_COMMUNITY): Payer: Self-pay | Admitting: Internal Medicine

## 2018-03-23 ENCOUNTER — Ambulatory Visit (HOSPITAL_COMMUNITY)
Admission: RE | Admit: 2018-03-23 | Discharge: 2018-03-23 | Disposition: A | Discharge status: Home / Self Care | Payer: Medicare Other | Source: Ambulatory Visit | Attending: Internal Medicine | Admitting: Internal Medicine

## 2018-03-23 VITALS — BP 148/70 | HR 70 | Wt 134.8 lb

## 2018-03-23 DIAGNOSIS — Z79899 Other long term (current) drug therapy: Secondary | ICD-10-CM | POA: Diagnosis not present

## 2018-03-23 DIAGNOSIS — Z8249 Family history of ischemic heart disease and other diseases of the circulatory system: Secondary | ICD-10-CM | POA: Diagnosis not present

## 2018-03-23 DIAGNOSIS — Z7902 Long term (current) use of antithrombotics/antiplatelets: Secondary | ICD-10-CM | POA: Insufficient documentation

## 2018-03-23 DIAGNOSIS — Z79890 Hormone replacement therapy (postmenopausal): Secondary | ICD-10-CM | POA: Insufficient documentation

## 2018-03-23 DIAGNOSIS — E079 Disorder of thyroid, unspecified: Secondary | ICD-10-CM | POA: Insufficient documentation

## 2018-03-23 DIAGNOSIS — M349 Systemic sclerosis, unspecified: Secondary | ICD-10-CM | POA: Diagnosis not present

## 2018-03-23 DIAGNOSIS — I2510 Atherosclerotic heart disease of native coronary artery without angina pectoris: Secondary | ICD-10-CM

## 2018-03-23 DIAGNOSIS — I10 Essential (primary) hypertension: Secondary | ICD-10-CM | POA: Diagnosis not present

## 2018-03-23 DIAGNOSIS — Z7982 Long term (current) use of aspirin: Secondary | ICD-10-CM | POA: Insufficient documentation

## 2018-03-23 DIAGNOSIS — M545 Low back pain: Secondary | ICD-10-CM | POA: Diagnosis not present

## 2018-03-23 DIAGNOSIS — E785 Hyperlipidemia, unspecified: Secondary | ICD-10-CM | POA: Insufficient documentation

## 2018-03-23 DIAGNOSIS — G8929 Other chronic pain: Secondary | ICD-10-CM | POA: Diagnosis not present

## 2018-03-23 DIAGNOSIS — Z955 Presence of coronary angioplasty implant and graft: Secondary | ICD-10-CM | POA: Diagnosis not present

## 2018-03-23 DIAGNOSIS — I509 Heart failure, unspecified: Secondary | ICD-10-CM | POA: Insufficient documentation

## 2018-03-23 DIAGNOSIS — I110 Hypertensive heart disease with heart failure: Secondary | ICD-10-CM | POA: Insufficient documentation

## 2018-03-23 NOTE — Patient Instructions (Signed)
Your physician has requested that you have an echocardiogram. Echocardiography is a painless test that uses sound waves to create images of your heart. It provides your doctor with information about the size and shape of your heart and how well your heart's chambers and valves are working. This procedure takes approximately one hour. There are no restrictions for this procedure.   Your physician has requested that you regularly monitor and record your blood pressure readings at home. Please use the same machine at the same time of day to check your readings and record them to bring to your follow-up visit. If your systolic blood pressure (top number) is greater than 140 consistently call us at [phone] option #2.  Follow up with Dr. Haroldine Laws in 1 year.

## 2018-03-23 NOTE — Progress Notes (Signed)
PCP: Dr. Andria Frames Referring: Dr. Kirke Corin    HPI:  Rhonda Brewer is a 68 y/o woman with h/o scleroderma, CAD s/p stent 9/17 , SVT s/p ablation 2/05 referred by Dr. Patrecia Pour for screening for [facility] in setting of scleroderma.   Previously lived in [place]. 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 At time had exertional fatigue and dyspnea and arm tingling. No CP.    Cath 11/24/15 LM: mild irregs LAD: 20% mid LCX: mild irreg RCA: mRCA 99% ->Xience Alpine DES 3.5x62mm  Echo 11/22/15: LVEF 55-60% Mild AI. Normal RV. Mild TR.  Echo 08/27/16: EF 60-65%,  RV normal Mild AI. No RV strain or PAH.   PFTs 08/2016 FEV1 2.11 (88%) FVC 2.86 (91%) DLCO 63%  She was seen in the HF clinic for initial evaluation in June 2018. It was felt that her dyspnea could be due to Brilinta. She was switched to Plavix. Chest CT was ordered and showed no interstitial lung disease.   Since we last saw her underwent cath 5/19 which showed very mild CAD. No evidence. There was a small mstep-up in saturations at RA level suggestive of anomalous pulmonary vein or shunt at atrial level    Mid RCA lesion is 20% stenosed.  Prox LAD lesion is 40% stenosed.  Here for routine f/u. Doing great. Exercising at least 5x/week with Zumba, weigh training and spinning. No further CP. No undue SOB. No edema. Scleroderma under control.    Cath 5/19:  Ao = 137/66 (94) LV = 141/13 RA = 2 RV = 20/4 PA = 19/6 (12) PCW = 7 Fick cardiac output/index = 5.3/3.1 Thermo CO/CI = 3.6/2.2 PVR = 0.7 WU SVR 1402 Ao sat = 99% PA sat = 79%, 80% High SVC sat = 68% RA sat = 78% RV sat = 75% Qp/Qs = 1.11 Assessment:  1. CAD with patent RCA stent and mild non-obstructive CAD in LAD 2. Normal LVEF 60-65% 3. Normal right heart pressures with no evidence of PAH 4. Small step-up in saturations at RA level suggestive of anomalous pulmonary  vein or shunt at atrial level      Past Medical History:  Diagnosis Date  . Allergy   . Arthritis   . CAD (coronary artery disease)    a. DES to RCA 11/2015  . Cancer [facility])    Uterine 2008  . Heart murmur   . Hyperlipidemia   . Osteoporosis   . Thyroid disease     Current Outpatient Medications  Medication Sig Dispense Refill  . aspirin EC 81 MG tablet Take 81 mg by mouth daily.     Marland Kitchen atorvastatin (LIPITOR) 40 MG tablet Take 1 tablet (40 mg total) by mouth daily. 30 tablet 5  . Calcium Carbonate-Vitamin D (CALCIUM-VITAMIN D3 PO) Take 1 tablet by mouth daily.    . cycloSPORINE (RESTASIS) 0.05 % ophthalmic emulsion Place 1 drop into both eyes 2 (two) times daily.    Marland Kitchen levothyroxine (SYNTHROID) 75 MCG tablet Take 1 tablet (75 mcg total) by mouth daily before breakfast. 90 tablet 4  . Multiple Vitamin (MULTI-VITAMINS) TABS Take 1 tablet by mouth daily.     . pantoprazole (PROTONIX) 40 MG tablet TAKE 1 TABLET(40 MG) BY MOUTH DAILY 90 tablet 3  . vitamin B-12 (CYANOCOBALAMIN) 100 MCG tablet Take 100 mcg by mouth daily.    . nitroGLYCERIN (NITROSTAT) 0.4 MG SL tablet  Place 1 tablet (0.4 mg total) under the tongue every 5 (five) minutes as needed for chest pain. (Patient not taking: Reported on 03/23/2018) 25 tablet 3   No current facility-administered medications for this encounter.      Allergies  Allergen Reactions  . Avelox [Moxifloxacin Hcl In Nacl] Other (See Comments)    dizziness  . Other Other (See Comments)    SEASONAL     Social History   Socioeconomic History  . Marital status: Married    Spouse name: Cay Schillings  . Number of children: 2  . Years of education: 50  . Highest education level: 12th grade  Occupational History  . Occupation: retired    Comment: Scientist, water quality  Social Needs  . Financial resource strain: Not hard at all  . Food insecurity:    Worry: Never true    Inability: Never true  . Transportation needs:    Medical: No    Non-medical: No   Tobacco Use  . Smoking status: Never Smoker  . Smokeless tobacco: Never Used  Substance and Sexual Activity  . Alcohol use: Yes    Comment: occasional wine  . Drug use: No  . Sexual activity: Yes    Birth control/protection: Surgical    Comment: hysterectomy  Lifestyle  . Physical activity:    Days per week: 5 days    Minutes per session: 90 min  . Stress: Not at all  Relationships  . Social connections:    Talks on phone: More than three times a week    Gets together: More than three times a week    Attends religious service: Never    Active member of club or organization: Yes    Attends meetings of clubs or organizations: More than 4 times per year    Relationship status: Married  . Intimate partner violence:    Fear of current or ex partner: No    Emotionally abused: No    Physically abused: No    Forced sexual activity: No  Other Topics Concern  . Not on file  Social History Narrative   Lives in home with husband. Daughters live out of state. Step-daughter in [place]. Has 8 grandchildren. Lives in 2 level home but most living is on first floor. Stairs have handrails, no problem with stairs. No grab bars in bathroom. No tripping hazards. Smoke alarms present.   No pets.    Eats a good variety of foods, husband is a cook. Eats meats, fruits, vegetables. Drinks water mostly. Occasional tea.   Goes to gym 5 times a week. Enjoys reading, sewing, movies. Walking when weather is nice.    Wears seat belt in car.     Family History  Problem Relation Age of Onset  . Asthma Maternal Aunt   . Heart disease Mother   . Diabetes Mother   . Heart disease Father   . Stomach cancer Brother   . Diabetes Brother   . Colon cancer Neg Hx   . Esophageal cancer Neg Hx   . Rectal cancer Neg Hx     PHYSICAL EXAM: Vitals:   03/23/18 1347  BP: (!) 148/70  Pulse: 70  SpO2: 100%    General:  Well appearing. No resp difficulty HEENT: normal Neck: supple. no JVD. Carotids 2+ bilat; no  bruits. No lymphadenopathy or thryomegaly appreciated. Cor: PMI nondisplaced. Regular rate & rhythm. No rubs, gallops or murmurs. Lungs: clear Abdomen: soft, nontender, nondistended. No hepatosplenomegaly. No bruits or masses. Good bowel sounds.  Extremities: no cyanosis, clubbing, rash, edema Neuro: alert & orientedx3, cranial nerves grossly intact. moves all 4 extremities w/o difficulty. Affect pleasant   ASSESSMENT & PLAN:  1. CAD s/p RCA stent in 11/2015: - repeat cath 5/19 for recurrent CP show mild non-obstructive CAD - results reviewed with her - no further CP  - Continue ASA and atorvastatin 40 mg  2. Scleroderma - DLCO mildly reduced on PFTs 6/18. No R heart strain on echo. CT without ILD.  - RHC 5/19 no PAH - Will repeat PFTs with Dr. Chase Caller. Will repeat echo in 1 year as recent cath ok.   3. Hypertension - BP mildly elevated here. She will follow it at the gym for a few weeks. If SBP consistently > 142mmHG she will call me to discuss.   Rhonda Bickers, MD  2:01 PM

## 2018-03-23 NOTE — Addendum Note (Signed)
Encounter addended by: Marlise Eves, RN on: 03/23/2018 2:10 PM  Actions taken: Order list changed, Diagnosis association updated, Clinical Note Signed

## 2018-03-26 DIAGNOSIS — Z6824 Body mass index (BMI) 24.0-24.9, adult: Secondary | ICD-10-CM | POA: Diagnosis not present

## 2018-03-26 DIAGNOSIS — Z1231 Encounter for screening mammogram for malignant neoplasm of breast: Secondary | ICD-10-CM | POA: Diagnosis not present

## 2018-04-02 ENCOUNTER — Encounter: Payer: Self-pay | Admitting: Family Medicine

## 2018-04-13 DIAGNOSIS — G8929 Other chronic pain: Secondary | ICD-10-CM | POA: Diagnosis not present

## 2018-04-13 DIAGNOSIS — M545 Low back pain: Secondary | ICD-10-CM | POA: Diagnosis not present

## 2018-04-27 DIAGNOSIS — M65341 Trigger finger, right ring finger: Secondary | ICD-10-CM | POA: Insufficient documentation

## 2018-04-27 NOTE — Progress Notes (Deleted)
Office Visit Note  Patient: Rhonda Brewer             Date of Birth: [birth year]           MRN: [id number]             PCP: [name], MD Referring: [name], MD Visit Date: 05/07/2018 Occupation: @GUAROCC @  Subjective:  No chief complaint on file.   History of Present Illness: Rhonda Brewer is a 68 y.o. female ***   Activities of Daily Living:  Patient reports morning stiffness for *** {minute/hour:19697}.   Patient {ACTIONS;DENIES/REPORTS:21021675::"Denies"} nocturnal pain.  Difficulty dressing/grooming: {ACTIONS;DENIES/REPORTS:21021675::"Denies"} Difficulty climbing stairs: {ACTIONS;DENIES/REPORTS:21021675::"Denies"} Difficulty getting out of chair: {ACTIONS;DENIES/REPORTS:21021675::"Denies"} Difficulty using hands for taps, buttons, cutlery, and/or writing: {ACTIONS;DENIES/REPORTS:21021675::"Denies"}  No Rheumatology ROS completed.   PMFS History:  Patient Active Problem List   Diagnosis Date Noted  . Carpal tunnel syndrome 05/29/2017  . Chronic low back pain 04/29/2017  . Screening for breast cancer 09/04/2016  . Pes anserinus bursitis of left knee 09/04/2016  . Dyspnea and respiratory abnormalities 08/16/2016  . LUQ abdominal pain 06/26/2016  . H/O cardiac radiofrequency ablation 06/26/2016  . CAD (coronary artery disease), native coronary artery 06/19/2016  . History of hypothyroidism 06/19/2016  . History of uterine cancer 06/19/2016  . Scleroderma ([place]) 06/16/2016  . High risk medication use 06/16/2016  . Osteopenia of multiple sites 06/16/2016    Past Medical History:  Diagnosis Date  . Allergy   . Arthritis   . CAD (coronary artery disease)    a. DES to RCA 11/2015  . Cancer [facility])    Uterine 2008  . Heart murmur   . Hyperlipidemia   . Osteoporosis   . Thyroid disease     Family History  Problem Relation Age of Onset  . Asthma Maternal Aunt   . Heart disease Mother   . Diabetes Mother   . Heart disease Father   . Stomach cancer  Brother   . Diabetes Brother   . Colon cancer Neg Hx   . Esophageal cancer Neg Hx   . Rectal cancer Neg Hx    Past Surgical History:  Procedure Laterality Date  . ABDOMINAL HYSTERECTOMY    . APPENDECTOMY    . CARDIAC ELECTROPHYSIOLOGY MAPPING AND ABLATION  04/19/2003  . CARPAL TUNNEL RELEASE Right 10/15/2017  . CORONARY ANGIOPLASTY WITH STENT PLACEMENT    . RIGHT/LEFT HEART CATH AND CORONARY ANGIOGRAPHY [name] 08/14/2017   Procedure: RIGHT/LEFT HEART CATH AND CORONARY ANGIOGRAPHY;  Surgeon: Jolaine Artist, MD;  Location: [facility];  Service: Cardiovascular;  Laterality: [name];  . ULTRASOUND GUIDANCE FOR VASCULAR ACCESS  08/14/2017   Procedure: Ultrasound Guidance For Vascular Access;  Surgeon: Jolaine Artist, MD;  Location: [facility];  Service: Cardiovascular;;   Social History   Social History Narrative   Lives in home with husband. Daughters live out of state. Step-daughter in [place]. Has 8 grandchildren. Lives in 2 level home but most living is on first floor. Stairs have handrails, no problem with stairs. No grab bars in bathroom. No tripping hazards. Smoke alarms present.   No pets.    Eats a good variety of foods, husband is a cook. Eats meats, fruits, vegetables. Drinks water mostly. Occasional tea.   Goes to gym 5 times a week. Enjoys reading, sewing, movies. Walking when weather is nice.    Wears seat belt in car.    Immunization History  Administered Date(s) Administered  . Influenza Split  01/17/2016  . Influenza-Unspecified 04/02/2017, 12/08/2017  . Pneumococcal Conjugate-13 10/26/2014  . Pneumococcal-Unspecified 09/14/2013  . Td 07/18/2011     Objective: Vital Signs: There were no vitals taken for this visit.   Physical Exam   Musculoskeletal Exam: ***  CDAI Exam: CDAI Score: Not documented Patient Global Assessment: Not documented; Provider Global Assessment: Not documented Swollen: Not documented; Tender: Not documented Joint Exam   Not  documented   There is currently no information documented on the homunculus. Go to the Rheumatology activity and complete the homunculus joint exam.  Investigation: No additional findings.  Imaging: No results found.  Recent Labs: Lab Results  Component Value Date   WBC 6.6 01/21/2018   HGB 14.0 01/21/2018   PLT 174 01/21/2018   NA 141 01/21/2018   K 4.4 01/21/2018   CL 110 01/21/2018   CO2 22 01/21/2018   GLUCOSE 93 01/21/2018   BUN 18 01/21/2018   CREATININE 0.75 01/21/2018   BILITOT 0.5 01/21/2018   ALKPHOS 64 06/19/2016   AST 38 (H) 01/21/2018   ALT 41 (H) 01/21/2018   PROT 6.5 01/21/2018   ALBUMIN 4.4 06/19/2016   CALCIUM 9.5 01/21/2018   GFRAA 96 01/21/2018    Speciality Comments: PLQ Eye Exam: 08/07/17 PLQ toxcity,  PLQ discotinued @ [facility]  Procedures:  No procedures performed Allergies: Avelox [moxifloxacin hcl in nacl] and Other   Assessment / Plan:     Visit Diagnoses: Scleroderma ([place]) - Limited systemic, Raynauds, Telengectesia's, sclerodactyly, arthralgias, erosions in right 5th and left 3rd DIP, ANA centromere:Dr. Chase Caller, Dr. Haroldine Laws  High risk medication use - Dr. Katy Fitch advised patient to stop taking PLQ.  She has been off of Plaquenil since May 2019  Raynaud's phenomenon without gangrene  Thrombocytopenia ([place])  Osteopenia of multiple sites - DEXA 6/2018t score -1.6 left femur  History of uterine cancer - 2008, status post hysterectomy, chemotherapy, radiation therapy    History of hypothyroidism  History of coronary artery disease  H/O cardiac radiofrequency ablation  Trigger middle finger of left hand  Pes anserinus bursitis of left knee   Orders: No orders of the defined types were placed in this encounter.  No orders of the defined types were placed in this encounter.   Face-to-face time spent with patient was *** minutes. Greater than 50% of time was spent in counseling and coordination of care.  Follow-Up  Instructions: No follow-ups on file.   Ofilia Neas, PA-C  Note - This record has been created using Dragon software.  Chart creation errors have been sought, but may not always  have been located. Such creation errors do not reflect on  the standard of medical care.

## 2018-05-06 DIAGNOSIS — G8929 Other chronic pain: Secondary | ICD-10-CM | POA: Diagnosis not present

## 2018-05-06 DIAGNOSIS — M545 Low back pain: Secondary | ICD-10-CM | POA: Diagnosis not present

## 2018-05-07 ENCOUNTER — Ambulatory Visit: Payer: Medicare Other | Admitting: Rheumatology

## 2018-05-22 DIAGNOSIS — H25813 Combined forms of age-related cataract, bilateral: Secondary | ICD-10-CM | POA: Diagnosis not present

## 2018-05-22 DIAGNOSIS — M069 Rheumatoid arthritis, unspecified: Secondary | ICD-10-CM | POA: Diagnosis not present

## 2018-05-22 DIAGNOSIS — H04123 Dry eye syndrome of bilateral lacrimal glands: Secondary | ICD-10-CM | POA: Diagnosis not present

## 2018-05-22 DIAGNOSIS — Z79899 Other long term (current) drug therapy: Secondary | ICD-10-CM | POA: Diagnosis not present

## 2018-05-22 DIAGNOSIS — H43823 Vitreomacular adhesion, bilateral: Secondary | ICD-10-CM | POA: Diagnosis not present

## 2018-05-27 DIAGNOSIS — G8929 Other chronic pain: Secondary | ICD-10-CM | POA: Diagnosis not present

## 2018-05-27 DIAGNOSIS — M545 Low back pain: Secondary | ICD-10-CM | POA: Diagnosis not present

## 2018-05-29 ENCOUNTER — Telehealth: Payer: Self-pay | Admitting: *Deleted

## 2018-05-29 NOTE — Telephone Encounter (Signed)
Noted.  Nothing to add.

## 2018-05-29 NOTE — Telephone Encounter (Signed)
Pt calls because she is not feeling well.  Fever - no Sore throat - yes Runny nose- yes Cough - yes SOB - no Travel - last week traveled thru [place], [place], [place], [place] only stopping to sleep.  Advised that no fever or SOB was a good thing.  Pt sounds good on the phone. Advised to rest, drink water and treat symptoms.  To call back if she develops fever or SOB.  Pt is aware that we will call back if Dr. Andria Frames has more to add. Pt appreciative.  Christen Bame, CMA

## 2018-06-03 ENCOUNTER — Encounter: Payer: Self-pay | Admitting: Sports Medicine

## 2018-06-03 ENCOUNTER — Other Ambulatory Visit: Payer: Self-pay | Admitting: Sports Medicine

## 2018-06-03 ENCOUNTER — Telehealth: Payer: Self-pay | Admitting: Sports Medicine

## 2018-06-03 DIAGNOSIS — R6889 Other general symptoms and signs: Secondary | ICD-10-CM

## 2018-06-03 NOTE — Progress Notes (Signed)
Order placed for COVID-19 testing.  

## 2018-06-03 NOTE — Telephone Encounter (Signed)
Reviewed patient answers. Discussed with additional clinic staff. Patient was recommended for testing. Pt advised on recommendation and will go to pre-established testing location.

## 2018-06-05 ENCOUNTER — Other Ambulatory Visit: Payer: Self-pay | Admitting: Family Medicine

## 2018-06-05 MED ORDER — ALBUTEROL SULFATE HFA 108 (90 BASE) MCG/ACT IN AERS: 2.0000 | INHALATION_SPRAY | RESPIRATORY_TRACT | 0 refills | Status: DC | PRN

## 2018-06-05 NOTE — Progress Notes (Signed)
Patient called to request albuterol inhaler.

## 2018-06-10 ENCOUNTER — Ambulatory Visit (INDEPENDENT_AMBULATORY_CARE_PROVIDER_SITE_OTHER): Payer: Medicare Other | Admitting: Family Medicine

## 2018-06-10 ENCOUNTER — Telehealth: Payer: Self-pay | Admitting: Family Medicine

## 2018-06-10 ENCOUNTER — Other Ambulatory Visit: Payer: Self-pay

## 2018-06-10 ENCOUNTER — Telehealth: Payer: Self-pay

## 2018-06-10 VITALS — BP 110/60 | Temp 99.9°F | Wt 135.0 lb

## 2018-06-10 DIAGNOSIS — J189 Pneumonia, unspecified organism: Secondary | ICD-10-CM | POA: Insufficient documentation

## 2018-06-10 DIAGNOSIS — J181 Lobar pneumonia, unspecified organism: Secondary | ICD-10-CM | POA: Diagnosis not present

## 2018-06-10 MED ORDER — AMOXICILLIN 875 MG PO TABS: 875.0000 mg | ORAL_TABLET | Freq: Two times a day (BID) | ORAL | 0 refills | Status: DC

## 2018-06-10 NOTE — Progress Notes (Signed)
   HPI [name] who presents for 2 to 3-week history of cough, sore throat, congestion, fevers.  Symptoms initially started as a sore throat and rhinorrhea.  This then progressed to cough, intermittent febrility, and sinus pressure.  Patient did undergo CoViD testing on 3/18 which was negative.  Patient with low-grade fevers getting up to between 100-101 for about 2 weeks until she spiked up to 103 multiple times early this week.  She has had limited shortness of breath, stating that occasionally she will be coughing so much that she has a hard time catching her breath.  There is been no period between when her symptoms first started and now where she has been without symptoms.  Of note patient does have history of scleroderma.  She has had no pulmonary involvement from this disease.  She is not currently taking any immune suppressant medication.  CC: Cough, fever   ROS:   Review of Systems See HPI for ROS.   CC, SH/smoking status, and VS noted  Objective: BP 110/60   Temp 99.9 F (37.7 C) (Oral)   Wt 135 lb (61.2 kg)   SpO2 90%   BMI 23.91 kg/m  Gen: [name], no acute distress HEENT: Bilateral Cervical lymphadenopathy.  Moist mucous membranes CV: RRR, no murmur Resp: Consolidation noted right middle lobe.,  No sensory muscle use, no increased work of breathing. Neuro: Alert and oriented, Speech clear, No gross deficits   Assessment and plan:  Community acquired pneumonia of right middle lobe of lung ([place]) Given patient's long duration of symptoms and consolidation noted on exam will treat as a community-acquired pneumonia.  Felt less likely to be viral secondary to long time course and acute worsening of symptoms in recent past.  Will give amoxicillin 875 mg twice daily for 7 days.  Patient to call if symptoms do not improve over this time period. Discussed return precautions and emergency symptoms to be aware of.   No orders of the  defined types were placed in this encounter.   Meds ordered this encounter  Medications  . amoxicillin (AMOXIL) 875 MG tablet    Sig: Take 1 tablet (875 mg total) by mouth 2 (two) times daily.    Dispense:  14 tablet    Refill:  0     Guadalupe Dawn MD PGY-2 Family Medicine Resident  06/10/2018 3:25 PM

## 2018-06-10 NOTE — Assessment & Plan Note (Addendum)
Given patient's long duration of symptoms and consolidation noted on exam will treat as a community-acquired pneumonia.  Felt less likely to be viral secondary to long time course and acute worsening of symptoms in recent past.  Will give amoxicillin 875 mg twice daily for 7 days.  Patient to call if symptoms do not improve over this time period. Discussed return precautions and emergency symptoms to be aware of.

## 2018-06-10 NOTE — Patient Instructions (Addendum)
It was great meeting you today! I think your symptoms and exam are consistent with a community acquired pneumonia. I will give you an antibiotic called amoxicillin. You will take this two times per day for 7 days. You can take over the counter cough medication and cepacol lozenges to help with your symptoms. Can also take tylenol to help with the fever. If you are not feeling better towards the end of your antibiotic regimen please let us know.

## 2018-06-10 NOTE — Telephone Encounter (Signed)
Patient has history of ongoing cough, SOB, feversup to 103F for 2 weeks. Patient has negative COVID19 testing. RVP was not performed. Recommend evaluation of patient to be seen in clinic.

## 2018-06-10 NOTE — Telephone Encounter (Signed)
Called patient to inform of negative COVID test.

## 2018-06-18 LAB — NOVEL CORONAVIRUS, NAA: SARS-COV-2, NAA: NOT DETECTED

## 2018-06-20 ENCOUNTER — Other Ambulatory Visit: Payer: Self-pay | Admitting: Family Medicine

## 2018-06-20 DIAGNOSIS — R1012 Left upper quadrant pain: Secondary | ICD-10-CM

## 2018-06-22 ENCOUNTER — Ambulatory Visit: Payer: Medicare Other | Admitting: Physician Assistant

## 2018-07-01 ENCOUNTER — Other Ambulatory Visit (HOSPITAL_COMMUNITY): Payer: Self-pay | Admitting: Internal Medicine

## 2018-07-29 NOTE — Progress Notes (Signed)
Office Visit Note  Patient: Rhonda Brewer             Date of Birth: 15-Dec-1950           MRN: [id number]             PCP: [name], MD Referring: [name], MD Visit Date: 08/07/2018 Occupation: @GUAROCC @  Subjective:  Right middle finger pain  History of Present Illness: Rhonda Brewer is a 68 y.o. female with history of scleroderma and Raynaud's.  She denies any new or worsening symptoms of scleroderma.  Her symptoms of Raynaud's have improved with warmer weather.  She denies any digital ulcerations or signs of gangrene.  She is having increased stiffness in both hands especially the right hand.  She denies any recent rashes or new telangectasia's.  She denies any dysphagia.  She denies any sores in her mouth or nose.  She denies any shortness or other chest pain recently.  She continues to follow-up with Dr. Chase Caller and also her [facility] on a yearly basis. She presents today with a right middle trigger finger that started about 1 month ago.  She has been experiencing tenderness and locking intermittently.  She experiences some stiffness in both knee joints if she sitting for long periods of time.  She denies any joint swelling.     Activities of Daily Living:  Patient reports morning stiffness for 15 minutes.   Patient Reports nocturnal pain.  Difficulty dressing/grooming: Denies Difficulty climbing stairs: Denies Difficulty getting out of chair: Denies Difficulty using hands for taps, buttons, cutlery, and/or writing: Denies  Review of Systems  Constitutional: Negative for fatigue.  HENT: Positive for mouth dryness. Negative for mouth sores and nose dryness.   Eyes: Positive for dryness. Negative for pain and visual disturbance.  Respiratory: Negative for cough, hemoptysis, shortness of breath and difficulty breathing.   Cardiovascular: Negative for chest pain, palpitations, hypertension and swelling in legs/feet.  Gastrointestinal: Negative for blood in  stool, constipation and diarrhea.  Endocrine: Negative for increased urination.  Genitourinary: Negative for difficulty urinating and painful urination.  Musculoskeletal: Positive for arthralgias, joint pain and morning stiffness. Negative for joint swelling, myalgias, muscle weakness, muscle tenderness and myalgias.  Skin: Negative for color change, pallor, rash, hair loss, nodules/bumps, skin tightness, ulcers and sensitivity to sunlight.  Allergic/Immunologic: Negative for susceptible to infections.  Neurological: Negative for dizziness, numbness, headaches and weakness.  Hematological: Negative for swollen glands.  Psychiatric/Behavioral: Negative for depressed mood and sleep disturbance. The patient is not nervous/anxious.     PMFS History:  Patient Active Problem List   Diagnosis Date Noted  . Community acquired pneumonia of right middle lobe of lung ([place]) 06/10/2018  . Carpal tunnel syndrome 05/29/2017  . Chronic low back pain 04/29/2017  . Screening for breast cancer 09/04/2016  . Pes anserinus bursitis of left knee 09/04/2016  . Dyspnea and respiratory abnormalities 08/16/2016  . LUQ abdominal pain 06/26/2016  . H/O cardiac radiofrequency ablation 06/26/2016  . CAD (coronary artery disease), native coronary artery 06/19/2016  . History of hypothyroidism 06/19/2016  . History of uterine cancer 06/19/2016  . Scleroderma ([place]) 06/16/2016  . High risk medication use 06/16/2016  . Osteopenia of multiple sites 06/16/2016    Past Medical History:  Diagnosis Date  . Allergy   . Arthritis   . CAD (coronary artery disease)    a. DES to RCA 11/2015  . Cancer [facility])    Uterine 2008  . Heart murmur   .  Hyperlipidemia   . Osteoporosis   . Thyroid disease     Family History  Problem Relation Age of Onset  . Asthma Maternal Aunt   . Heart disease Mother   . Diabetes Mother   . Heart disease Father   . Stomach cancer Brother   . Diabetes Brother   . Colon cancer Neg Hx   .  Esophageal cancer Neg Hx   . Rectal cancer Neg Hx    Past Surgical History:  Procedure Laterality Date  . ABDOMINAL HYSTERECTOMY    . APPENDECTOMY    . CARDIAC ELECTROPHYSIOLOGY MAPPING AND ABLATION  04/19/2003  . CARPAL TUNNEL RELEASE Right 10/15/2017  . CORONARY ANGIOPLASTY WITH STENT PLACEMENT    . RIGHT/LEFT HEART CATH AND CORONARY ANGIOGRAPHY [name] 08/14/2017   Procedure: RIGHT/LEFT HEART CATH AND CORONARY ANGIOGRAPHY;  Surgeon: Jolaine Artist, MD;  Location: [place] [facility];  Service: Cardiovascular;  Laterality: [name];  . ULTRASOUND GUIDANCE FOR VASCULAR ACCESS  08/14/2017   Procedure: Ultrasound Guidance For Vascular Access;  Surgeon: Jolaine Artist, MD;  Location: [place] [facility];  Service: Cardiovascular;;   Social History   Social History Narrative   Lives in home with husband. Daughters live out of state. Step-daughter in [place]. Has 8 grandchildren. Lives in 2 level home but most living is on first floor. Stairs have handrails, no problem with stairs. No grab bars in bathroom. No tripping hazards. Smoke alarms present.   No pets.    Eats a good variety of foods, husband is a cook. Eats meats, fruits, vegetables. Drinks water mostly. Occasional tea.   Goes to gym 5 times a week. Enjoys reading, sewing, movies. Walking when weather is nice.    Wears seat belt in car.    Immunization History  Administered Date(s) Administered  . Influenza Split 01/17/2016  . Influenza-Unspecified 04/02/2017, 12/08/2017  . Pneumococcal Conjugate-13 10/26/2014  . Pneumococcal-Unspecified 09/14/2013  . Td 07/18/2011     Objective: Vital Signs: BP (!) 118/57 (BP Location: Left Arm, Patient Position: Sitting, Cuff Size: Normal)   Pulse 61   Resp 14   Ht 5\' 3"  (1.6 m)   Wt 146 lb 3.2 oz (66.3 kg)   BMI 25.90 kg/m    Physical Exam Vitals signs and nursing note reviewed.  Constitutional:      Appearance: She is well-developed.  HENT:     Head: Normocephalic and atraumatic.   Eyes:     Conjunctiva/sclera: Conjunctivae normal.  Neck:     Musculoskeletal: Normal range of motion.  Cardiovascular:     Rate and Rhythm: Normal rate and regular rhythm.     Heart sounds: Normal heart sounds.  Pulmonary:     Effort: Pulmonary effort is normal.     Breath sounds: Normal breath sounds.  Abdominal:     General: Bowel sounds are normal.     Palpations: Abdomen is soft.  Lymphadenopathy:     Cervical: No cervical adenopathy.  Skin:    General: Skin is warm and dry.     Capillary Refill: Capillary refill takes less than 2 seconds.     Comments: Sclerodactyly noted on bilateral hands  Neurological:     Mental Status: She is alert and oriented to person, place, and time.  Psychiatric:        Behavior: Behavior normal.      Musculoskeletal Exam: C-spine, thoracic spine, spine good range of motion.  No midline spinal tenderness.  No SI joint tenderness.  Shoulders, elbows, wrist  joints, MCPs, PIPs, DIPs good range of motion no synovitis.  Right middle trigger finger noted.  Hip joints, knee joints, ankle joints, MTPs, PIPs, DIPs good range of motion no synovitis.  No warmth or effusion bilateral knee joints.  She has some tenderness over the left peds anserine bursa.  No warmth or effusion of bilateral knee joints.  No tenderness or swelling of ankle joints.  No tenderness over trochanter bursa bilaterally.  CDAI Exam: CDAI Score: Not documented Patient Global Assessment: Not documented; Provider Global Assessment: Not documented Swollen: Not documented; Tender: Not documented Joint Exam   Not documented   There is currently no information documented on the homunculus. Go to the Rheumatology activity and complete the homunculus joint exam.  Investigation: No additional findings.  Imaging: No results found.  Recent Labs: Lab Results  Component Value Date   WBC 6.6 01/21/2018   HGB 14.0 01/21/2018   PLT 174 01/21/2018   NA 141 01/21/2018   K 4.4 01/21/2018    CL 110 01/21/2018   CO2 22 01/21/2018   GLUCOSE 93 01/21/2018   BUN 18 01/21/2018   CREATININE 0.75 01/21/2018   BILITOT 0.5 01/21/2018   ALKPHOS 64 06/19/2016   AST 38 (H) 01/21/2018   ALT 41 (H) 01/21/2018   PROT 6.5 01/21/2018   ALBUMIN 4.4 06/19/2016   CALCIUM 9.5 01/21/2018   GFRAA 96 01/21/2018    Speciality Comments: PLQ Eye Exam: 08/07/17 PLQ toxcity,  PLQ discotinued @ [facility]  Procedures:  No procedures performed Allergies: Avelox [moxifloxacin hcl in nacl] and Other    Assessment / Plan:     Visit Diagnoses: Scleroderma ([place]) - Limited systemic with Raynauds, Telengectesia's, sclerodactyly, arthralgias, erosions in right fifth and left third DIP, ANA centromere: She has not developed any new or worsening symptoms.  She has sclerodactyly of both hands.  She has intermittent symptoms of Raynaud's but no digital stations or signs of gangrene were noted.  She has not had any symptoms of dysphagia or a reduction in oral aperture recently.  She continues to have stiffness in both hands and both knee joints but no synovitis or tenderness was noted on exam today.  She has not noticed any new Telangectasia's or rashes.  She continues to follow-up with Dr. Chase Caller and Dr. Haroldine Laws yearly basis.She had appointment with Dr. Haroldine Laws on 03/23/2018, and according to his note DLCO mildly reduced on PFTs from 6/18.  Echo on 08/27/16 did no reveal evidence of heart stain.  Chest CT on 10/07/17 did not reveal ILD.  [place] 5/19 no PAH.  She followed up with Dr. Chase Caller on 12/16/2017 and according to his note there is no evidence of ILD on imaging or examination. She is advised to notify us if she develops any new or worsening symptoms.  She will follow-up in the office in 5 months  High risk medication use - Dr. Katy Fitch advised patient to stop taking PLQ.  She has been off of Plaquenil since May 2019.  Raynaud's phenomenon without gangrene: She has intermittent symptoms of Raynolds.  Her  symptoms have improved with warmer weather.  No digital ulcerations or signs of gangrene were noted on exam today.  Trigger finger, right middle finger: She has been experiencing tenderness on the palmar aspect and locking intermittently for the past 1 month.  We discussed using Voltaren gel and wearing a splint or buddy taping to the adjacent finger.  She will notify us if her symptoms persist and we will schedule an  ultrasound-guided cortisone injection.  Trigger middle finger of left hand - Resolved  Pes anserinus bursitis of left knee - She continues to have tenderness intermittently.  She was encouraged to use voltaren gel topically PRN for pain relief.  Thrombocytopenia ([place]): Plt count 174 on 01/21/18.  Osteopenia of multiple sites - DEXA 08/2016 Tscore -1.6 left femur.  She continues to take calcium and vitamin D on a daily basis.  Other medical conditions are listed as follows:   History of uterine cancer  History of coronary artery disease  H/O cardiac radiofrequency ablation  History of hypothyroidism   Orders: No orders of the defined types were placed in this encounter.  No orders of the defined types were placed in this encounter.     Follow-Up Instructions: Return in about 5 months (around 01/07/2019) for Scleroderma.   Ofilia Neas, PA-C  Note - This record has been created using Dragon software.  Chart creation errors have been sought, but may not always  have been located. Such creation errors do not reflect on  the standard of medical care.

## 2018-08-07 ENCOUNTER — Encounter: Payer: Self-pay | Admitting: Physician Assistant

## 2018-08-07 ENCOUNTER — Other Ambulatory Visit: Payer: Self-pay

## 2018-08-07 ENCOUNTER — Ambulatory Visit: Payer: Medicare Other | Admitting: Physician Assistant

## 2018-08-07 ENCOUNTER — Encounter: Payer: Self-pay | Admitting: Rheumatology

## 2018-08-07 VITALS — BP 118/57 | HR 61 | Resp 14 | Ht 63.0 in | Wt 146.2 lb

## 2018-08-07 DIAGNOSIS — M65331 Trigger finger, right middle finger: Secondary | ICD-10-CM

## 2018-08-07 DIAGNOSIS — M8589 Other specified disorders of bone density and structure, multiple sites: Secondary | ICD-10-CM

## 2018-08-07 DIAGNOSIS — I7300 Raynaud's syndrome without gangrene: Secondary | ICD-10-CM

## 2018-08-07 DIAGNOSIS — M7052 Other bursitis of knee, left knee: Secondary | ICD-10-CM

## 2018-08-07 DIAGNOSIS — Z79899 Other long term (current) drug therapy: Secondary | ICD-10-CM | POA: Diagnosis not present

## 2018-08-07 DIAGNOSIS — M349 Systemic sclerosis, unspecified: Secondary | ICD-10-CM

## 2018-08-07 DIAGNOSIS — Z8679 Personal history of other diseases of the circulatory system: Secondary | ICD-10-CM

## 2018-08-07 DIAGNOSIS — Z8639 Personal history of other endocrine, nutritional and metabolic disease: Secondary | ICD-10-CM

## 2018-08-07 DIAGNOSIS — M65332 Trigger finger, left middle finger: Secondary | ICD-10-CM | POA: Diagnosis not present

## 2018-08-07 DIAGNOSIS — Z8542 Personal history of malignant neoplasm of other parts of uterus: Secondary | ICD-10-CM

## 2018-08-07 DIAGNOSIS — Z9889 Other specified postprocedural states: Secondary | ICD-10-CM

## 2018-08-07 DIAGNOSIS — D696 Thrombocytopenia, unspecified: Secondary | ICD-10-CM

## 2018-08-07 NOTE — Telephone Encounter (Signed)
Patient was in the office for an appointment on 08/07/18. Did you discuss sending in Voltaren Gell with her? Please advise.

## 2018-09-01 ENCOUNTER — Other Ambulatory Visit: Payer: Self-pay | Admitting: Family Medicine

## 2018-09-07 ENCOUNTER — Ambulatory Visit (INDEPENDENT_AMBULATORY_CARE_PROVIDER_SITE_OTHER): Payer: Medicare Other | Admitting: Rheumatology

## 2018-09-07 ENCOUNTER — Other Ambulatory Visit: Payer: Self-pay

## 2018-09-07 VITALS — BP 118/52 | HR 64

## 2018-09-07 DIAGNOSIS — M65331 Trigger finger, right middle finger: Secondary | ICD-10-CM | POA: Diagnosis not present

## 2018-09-07 MED ORDER — LIDOCAINE HCL 1 % IJ SOLN
0.5000 mL | INTRAMUSCULAR | Status: AC | PRN
  Administered 2018-09-07: .5 mL

## 2018-09-07 MED ORDER — TRIAMCINOLONE ACETONIDE 40 MG/ML IJ SUSP
10.0000 mg | INTRAMUSCULAR | Status: AC | PRN
  Administered 2018-09-07: 10 mg

## 2018-09-07 NOTE — Progress Notes (Signed)
   Procedure Note  Patient: Rhonda Brewer             Date of Birth: May 11, 1950           MRN: [id number]             Visit Date: 09/07/2018  Procedures: Visit Diagnoses: Trigger finger, right middle finger  Hand/UE Inj: R long A1 for trigger finger on 09/07/2018 11:28 AM Indications: pain, tendon swelling and therapeutic Details: 27 G needle, ultrasound-guided volar approach Medications: 0.5 mL lidocaine 1 %; 10 mg triamcinolone acetonide 40 MG/ML Aspirate: 0 mL Consent was given by the patient. Immediately prior to procedure a time out was called to verify the correct patient, procedure, equipment, support staff and site/side marked as required. Patient was prepped and draped in the usual sterile fashion.    Bo Merino, MD

## 2018-10-06 ENCOUNTER — Other Ambulatory Visit: Payer: Self-pay | Admitting: Family Medicine

## 2018-10-06 ENCOUNTER — Encounter: Payer: Self-pay | Admitting: Family Medicine

## 2018-10-06 DIAGNOSIS — R1012 Left upper quadrant pain: Secondary | ICD-10-CM

## 2018-10-07 MED ORDER — PANTOPRAZOLE SODIUM 40 MG PO TBEC: 40.0000 mg | DELAYED_RELEASE_TABLET | Freq: Every day | ORAL | 3 refills | Status: DC

## 2018-10-15 ENCOUNTER — Encounter: Payer: Self-pay | Admitting: Family Medicine

## 2018-10-15 DIAGNOSIS — G8929 Other chronic pain: Secondary | ICD-10-CM

## 2018-10-15 DIAGNOSIS — M545 Low back pain, unspecified: Secondary | ICD-10-CM

## 2018-10-15 MED ORDER — BACLOFEN 10 MG PO TABS: 10.0000 mg | ORAL_TABLET | Freq: Three times a day (TID) | ORAL | 1 refills | Status: DC

## 2018-11-11 ENCOUNTER — Other Ambulatory Visit (HOSPITAL_COMMUNITY): Payer: Self-pay | Admitting: Internal Medicine

## 2018-11-12 ENCOUNTER — Ambulatory Visit
Admission: RE | Admit: 2018-11-12 | Discharge: 2018-11-12 | Disposition: A | Discharge status: Home / Self Care | Payer: Medicare Other | Source: Ambulatory Visit | Attending: Family Medicine | Admitting: Family Medicine

## 2018-11-12 ENCOUNTER — Other Ambulatory Visit: Payer: Self-pay

## 2018-11-12 ENCOUNTER — Encounter: Payer: Self-pay | Admitting: Family Medicine

## 2018-11-12 ENCOUNTER — Ambulatory Visit (INDEPENDENT_AMBULATORY_CARE_PROVIDER_SITE_OTHER): Payer: Medicare Other | Admitting: Family Medicine

## 2018-11-12 DIAGNOSIS — Z8542 Personal history of malignant neoplasm of other parts of uterus: Secondary | ICD-10-CM

## 2018-11-12 DIAGNOSIS — G8929 Other chronic pain: Secondary | ICD-10-CM

## 2018-11-12 DIAGNOSIS — Z8639 Personal history of other endocrine, nutritional and metabolic disease: Secondary | ICD-10-CM

## 2018-11-12 DIAGNOSIS — K5900 Constipation, unspecified: Secondary | ICD-10-CM

## 2018-11-12 DIAGNOSIS — M5137 Other intervertebral disc degeneration, lumbosacral region: Secondary | ICD-10-CM | POA: Diagnosis not present

## 2018-11-12 DIAGNOSIS — Z23 Encounter for immunization: Secondary | ICD-10-CM | POA: Diagnosis not present

## 2018-11-12 DIAGNOSIS — I2510 Atherosclerotic heart disease of native coronary artery without angina pectoris: Secondary | ICD-10-CM

## 2018-11-12 DIAGNOSIS — M545 Low back pain, unspecified: Secondary | ICD-10-CM

## 2018-11-12 NOTE — Assessment & Plan Note (Signed)
Add daily miralax.

## 2018-11-12 NOTE — Assessment & Plan Note (Signed)
Check TSH 

## 2018-11-12 NOTE — Progress Notes (Signed)
Established Patient Office Visit  Subjective:  Patient ID: Rhonda Brewer, female    DOB: [birth year]  Age: 68 y.o. MRN: [id number]  CC:  Chief Complaint  Patient presents with  . Annual Exam    HPI Rhonda Brewer presents for not annual exam.  FU mult problems. 1. Chronic low back pain.  Recent flair, which is subsiding.  The baclofen did not seem to help her much.  No radiation.  Does have a personal hx of cancer.  No old films to compare. 2. Constipation.  New.  No bleeding or abd pain.  Up to date on colon cancer screen. 3. CAD.  No chest pain.  On statin and aspirin.  No recent lipid panel 4. Needs flu and last pneumovax.   5. Hx of hypothyroid on levothyroxine.  Asymptomatic.  No recent TSH.  Past Medical History:  Diagnosis Date  . Allergy   . Arthritis   . CAD (coronary artery disease)    a. DES to RCA 11/2015  . Cancer [facility])    Uterine 2008  . Heart murmur   . Hyperlipidemia   . Osteoporosis   . Thyroid disease     Past Surgical History:  Procedure Laterality Date  . ABDOMINAL HYSTERECTOMY    . APPENDECTOMY    . CARDIAC ELECTROPHYSIOLOGY MAPPING AND ABLATION  04/19/2003  . CARPAL TUNNEL RELEASE Right 10/15/2017  . CORONARY ANGIOPLASTY WITH STENT PLACEMENT    . RIGHT/LEFT HEART CATH AND CORONARY ANGIOGRAPHY [name] 08/14/2017   Procedure: RIGHT/LEFT HEART CATH AND CORONARY ANGIOGRAPHY;  Surgeon: Jolaine Artist, MD;  Location: [place] [facility];  Service: Cardiovascular;  Laterality: [name];  . ULTRASOUND GUIDANCE FOR VASCULAR ACCESS  08/14/2017   Procedure: Ultrasound Guidance For Vascular Access;  Surgeon: Jolaine Artist, MD;  Location: [place] [facility];  Service: Cardiovascular;;    Family History  Problem Relation Age of Onset  . Asthma Maternal Aunt   . Heart disease Mother   . Diabetes Mother   . Heart disease Father   . Stomach cancer Brother   . Diabetes Brother   . Colon cancer Neg Hx   . Esophageal cancer Neg Hx   . Rectal cancer Neg Hx      Social History   Socioeconomic History  . Marital status: Married    Spouse name: Cay Schillings  . Number of children: 2  . Years of education: 65  . Highest education level: 12th grade  Occupational History  . Occupation: retired    Comment: Scientist, water quality  Social Needs  . Financial resource strain: Not hard at all  . Food insecurity    Worry: Never true    Inability: Never true  . Transportation needs    Medical: No    Non-medical: No  Tobacco Use  . Smoking status: Never Smoker  . Smokeless tobacco: Never Used  Substance and Sexual Activity  . Alcohol use: Yes    Comment: occasional wine  . Drug use: No  . Sexual activity: Yes    Birth control/protection: Surgical    Comment: hysterectomy  Lifestyle  . Physical activity    Days per week: 5 days    Minutes per session: 90 min  . Stress: Not at all  Relationships  . Social connections    Talks on phone: More than three times a week    Gets together: More than three times a week    Attends religious service: Never    Active member of club or  organization: Yes    Attends meetings of clubs or organizations: More than 4 times per year    Relationship status: Married  . Intimate partner violence    Fear of current or ex partner: No    Emotionally abused: No    Physically abused: No    Forced sexual activity: No  Other Topics Concern  . Not on file  Social History Narrative   Lives in home with husband. Daughters live out of state. Step-daughter in [place]. Has 8 grandchildren. Lives in 2 level home but most living is on first floor. Stairs have handrails, no problem with stairs. No grab bars in bathroom. No tripping hazards. Smoke alarms present.   No pets.    Eats a good variety of foods, husband is a cook. Eats meats, fruits, vegetables. Drinks water mostly. Occasional tea.   Goes to gym 5 times a week. Enjoys reading, sewing, movies. Walking when weather is nice.    Wears seat belt in car.     Outpatient  Medications Prior to Visit  Medication Sig Dispense Refill  . albuterol (PROVENTIL HFA;VENTOLIN HFA) 108 (90 Base) MCG/ACT inhaler INHALE 2 PUFFS INTO THE LUNGS EVERY 4 HOURS AS NEEDED FOR WHEEZING OR SHORTNESS OF BREATH 90 g 6  . aspirin EC 81 MG tablet Take 81 mg by mouth daily.     Marland Kitchen atorvastatin (LIPITOR) 40 MG tablet TAKE 1 TABLET BY MOUTH  DAILY 90 tablet 1  . Calcium Carbonate-Vitamin D (CALCIUM-VITAMIN D3 PO) Take 1 tablet by mouth daily.    . cycloSPORINE (RESTASIS) 0.05 % ophthalmic emulsion Place 1 drop into both eyes 2 (two) times daily.    Marland Kitchen levothyroxine (SYNTHROID) 75 MCG tablet TAKE 1 TABLET BY MOUTH  DAILY BEFORE BREAKFAST 90 tablet 4  . Multiple Vitamin (MULTI-VITAMINS) TABS Take 1 tablet by mouth daily.     . pantoprazole (PROTONIX) 40 MG tablet Take 1 tablet (40 mg total) by mouth daily. 90 tablet 3  . vitamin B-12 (CYANOCOBALAMIN) 100 MCG tablet Take 100 mcg by mouth daily.    . baclofen (LIORESAL) 10 MG tablet Take 1 tablet (10 mg total) by mouth 3 (three) times daily. 30 each 1  . nitroGLYCERIN (NITROSTAT) 0.4 MG SL tablet Place 1 tablet (0.4 mg total) under the tongue every 5 (five) minutes as needed for chest pain. (Patient not taking: Reported on 03/23/2018) 25 tablet 3   No facility-administered medications prior to visit.     Allergies  Allergen Reactions  . Avelox [Moxifloxacin Hcl In Nacl] Other (See Comments)    dizziness  . Other Other (See Comments)    SEASONAL     ROS Review of Systems    Objective:    Physical Exam  BP 108/60   Pulse 67   Wt 139 lb 12.8 oz (63.4 kg)   SpO2 99%   BMI 24.76 kg/m  Wt Readings from Last 3 Encounters:  11/12/18 139 lb 12.8 oz (63.4 kg)  08/07/18 146 lb 3.2 oz (66.3 kg)  06/10/18 135 lb (61.2 kg)   Paraspinous muscle tenderness in lumbar spine.   Normal lower ext. Strength, sensation and gait.    Health Maintenance Due  Topic Date Due  . PNA vac Low Risk Adult (2 of 2 - PPSV23) 09/15/2018  . INFLUENZA  VACCINE  10/17/2018    There are no preventive care reminders to display for this patient.  No results found for: TSH Lab Results  Component Value Date   WBC 6.6 01/21/2018  HGB 14.0 01/21/2018   HCT 40.8 01/21/2018   MCV 93.6 01/21/2018   PLT 174 01/21/2018   Lab Results  Component Value Date   NA 141 01/21/2018   K 4.4 01/21/2018   CO2 22 01/21/2018   GLUCOSE 93 01/21/2018   BUN 18 01/21/2018   CREATININE 0.75 01/21/2018   BILITOT 0.5 01/21/2018   ALKPHOS 64 06/19/2016   AST 38 (H) 01/21/2018   ALT 41 (H) 01/21/2018   PROT 6.5 01/21/2018   ALBUMIN 4.4 06/19/2016   CALCIUM 9.5 01/21/2018   ANIONGAP 7 08/14/2017   No results found for: CHOL No results found for: HDL No results found for: LDLCALC No results found for: TRIG No results found for: CHOLHDL No results found for: HGBA1C    Assessment & Plan:   Problem List Items Addressed This Visit    CAD (coronary artery disease), native coronary artery   Relevant Orders   Lipid panel   History of hypothyroidism   Relevant Orders   TSH   Low back pain without sciatica   Relevant Orders   DG Lumbar Spine Complete      No orders of the defined types were placed in this encounter.   Follow-up: No follow-ups on file.    [name], MD

## 2018-11-12 NOTE — Assessment & Plan Note (Signed)
Rx with topical meds

## 2018-11-12 NOTE — Patient Instructions (Addendum)
My favorite topical thing for the is roll on Capsicum cream Try miralax daily for constipation.  Both of these are over the counter meds. I will call with lab test results.   You can have another physical anytime after 03/10/19

## 2018-11-12 NOTE — Assessment & Plan Note (Signed)
Some risk for recurrence.  Check plain back films.

## 2018-11-12 NOTE — Assessment & Plan Note (Signed)
Continue meds.  Check labs.

## 2018-11-13 LAB — LIPID PANEL
Chol/HDL Ratio: 2.1 ratio (ref 0.0–4.4)
Cholesterol, Total: 169 mg/dL (ref 100–199)
HDL: 82 mg/dL (ref >39)
LDL Calculated: 73 mg/dL (ref 0–99)
Triglycerides: 71 mg/dL (ref 0–149)
VLDL Cholesterol Cal: 14 mg/dL (ref 5–40)

## 2018-11-13 LAB — TSH: TSH: 2.56 u[IU]/mL (ref 0.450–4.500)

## 2018-11-30 DIAGNOSIS — L821 Other seborrheic keratosis: Secondary | ICD-10-CM | POA: Diagnosis not present

## 2018-11-30 DIAGNOSIS — D2371 Other benign neoplasm of skin of right lower limb, including hip: Secondary | ICD-10-CM | POA: Diagnosis not present

## 2018-11-30 DIAGNOSIS — D692 Other nonthrombocytopenic purpura: Secondary | ICD-10-CM | POA: Diagnosis not present

## 2018-11-30 DIAGNOSIS — L819 Disorder of pigmentation, unspecified: Secondary | ICD-10-CM | POA: Diagnosis not present

## 2018-12-07 DIAGNOSIS — M545 Low back pain: Secondary | ICD-10-CM | POA: Diagnosis not present

## 2018-12-15 DIAGNOSIS — M545 Low back pain: Secondary | ICD-10-CM | POA: Diagnosis not present

## 2018-12-29 DIAGNOSIS — M545 Low back pain: Secondary | ICD-10-CM | POA: Diagnosis not present

## 2019-01-08 DIAGNOSIS — N390 Urinary tract infection, site not specified: Secondary | ICD-10-CM | POA: Diagnosis not present

## 2019-01-08 DIAGNOSIS — I10 Essential (primary) hypertension: Secondary | ICD-10-CM | POA: Insufficient documentation

## 2019-01-08 DIAGNOSIS — E785 Hyperlipidemia, unspecified: Secondary | ICD-10-CM | POA: Insufficient documentation

## 2019-01-08 DIAGNOSIS — E039 Hypothyroidism, unspecified: Secondary | ICD-10-CM | POA: Insufficient documentation

## 2019-01-08 DIAGNOSIS — C541 Malignant neoplasm of endometrium: Secondary | ICD-10-CM | POA: Insufficient documentation

## 2019-01-08 DIAGNOSIS — Z8542 Personal history of malignant neoplasm of other parts of uterus: Secondary | ICD-10-CM | POA: Insufficient documentation

## 2019-01-08 DIAGNOSIS — I519 Heart disease, unspecified: Secondary | ICD-10-CM | POA: Insufficient documentation

## 2019-01-08 DIAGNOSIS — R350 Frequency of micturition: Secondary | ICD-10-CM | POA: Diagnosis not present

## 2019-01-19 DIAGNOSIS — M545 Low back pain: Secondary | ICD-10-CM | POA: Diagnosis not present

## 2019-01-25 NOTE — Progress Notes (Deleted)
Office Visit Note  Patient: Rhonda Brewer             Date of Birth: 1951/02/22           MRN: [id number]             PCP: [name], MD Referring: [name], MD Visit Date: 02/08/2019 Occupation: @GUAROCC @  Subjective:  No chief complaint on file.   History of Present Illness: Ethan Cederquist is a 68 y.o. female ***   Activities of Daily Living:  Patient reports morning stiffness for *** {minute/hour:19697}.   Patient {ACTIONS;DENIES/REPORTS:21021675::"Denies"} nocturnal pain.  Difficulty dressing/grooming: {ACTIONS;DENIES/REPORTS:21021675::"Denies"} Difficulty climbing stairs: {ACTIONS;DENIES/REPORTS:21021675::"Denies"} Difficulty getting out of chair: {ACTIONS;DENIES/REPORTS:21021675::"Denies"} Difficulty using hands for taps, buttons, cutlery, and/or writing: {ACTIONS;DENIES/REPORTS:21021675::"Denies"}  No Rheumatology ROS completed.   PMFS History:  Patient Active Problem List   Diagnosis Date Noted  . Constipation 11/12/2018  . Carpal tunnel syndrome 05/29/2017  . Low back pain without sciatica 04/29/2017  . Screening for breast cancer 09/04/2016  . Dyspnea and respiratory abnormalities 08/16/2016  . LUQ abdominal pain 06/26/2016  . H/O cardiac radiofrequency ablation 06/26/2016  . CAD (coronary artery disease), native coronary artery 06/19/2016  . History of hypothyroidism 06/19/2016  . History of uterine cancer 06/19/2016  . Scleroderma ([place]) 06/16/2016  . High risk medication use 06/16/2016  . Osteopenia of multiple sites 06/16/2016    Past Medical History:  Diagnosis Date  . Allergy   . Arthritis   . CAD (coronary artery disease)    a. DES to RCA 11/2015  . Cancer [facility])    Uterine 2008  . Heart murmur   . Hyperlipidemia   . Osteoporosis   . Thyroid disease     Family History  Problem Relation Age of Onset  . Asthma Maternal Aunt   . Heart disease Mother   . Diabetes Mother   . Heart disease Father   . Stomach cancer Brother   .  Diabetes Brother   . Colon cancer Neg Hx   . Esophageal cancer Neg Hx   . Rectal cancer Neg Hx    Past Surgical History:  Procedure Laterality Date  . ABDOMINAL HYSTERECTOMY    . APPENDECTOMY    . CARDIAC ELECTROPHYSIOLOGY MAPPING AND ABLATION  04/19/2003  . CARPAL TUNNEL RELEASE Right 10/15/2017  . CORONARY ANGIOPLASTY WITH STENT PLACEMENT    . RIGHT/LEFT HEART CATH AND CORONARY ANGIOGRAPHY [name] 08/14/2017   Procedure: RIGHT/LEFT HEART CATH AND CORONARY ANGIOGRAPHY;  Surgeon: Jolaine Artist, MD;  Location: [place] [facility];  Service: Cardiovascular;  Laterality: [name];  . ULTRASOUND GUIDANCE FOR VASCULAR ACCESS  08/14/2017   Procedure: Ultrasound Guidance For Vascular Access;  Surgeon: Jolaine Artist, MD;  Location: [place] [facility];  Service: Cardiovascular;;   Social History   Social History Narrative   Lives in home with husband. Daughters live out of state. Step-daughter in [place]. Has 8 grandchildren. Lives in 2 level home but most living is on first floor. Stairs have handrails, no problem with stairs. No grab bars in bathroom. No tripping hazards. Smoke alarms present.   No pets.    Eats a good variety of foods, husband is a cook. Eats meats, fruits, vegetables. Drinks water mostly. Occasional tea.   Goes to gym 5 times a week. Enjoys reading, sewing, movies. Walking when weather is nice.    Wears seat belt in car.    Immunization History  Administered Date(s) Administered  . Influenza Split 01/17/2016  . Influenza,inj,Quad  PF,6+ Mos 11/12/2018  . Influenza-Unspecified 04/02/2017, 12/08/2017  . Pneumococcal Conjugate-13 10/26/2014  . Pneumococcal-Unspecified 09/14/2013  . Td 07/18/2011     Objective: Vital Signs: There were no vitals taken for this visit.   Physical Exam   Musculoskeletal Exam: ***  CDAI Exam: CDAI Score: - Patient Global: -; Provider Global: - Swollen: -; Tender: - Joint Exam   No joint exam has been documented for this visit   There  is currently no information documented on the homunculus. Go to the Rheumatology activity and complete the homunculus joint exam.  Investigation: No additional findings.  Imaging: No results found.  Recent Labs: Lab Results  Component Value Date   WBC 6.6 01/21/2018   HGB 14.0 01/21/2018   PLT 174 01/21/2018   NA 141 01/21/2018   K 4.4 01/21/2018   CL 110 01/21/2018   CO2 22 01/21/2018   GLUCOSE 93 01/21/2018   BUN 18 01/21/2018   CREATININE 0.75 01/21/2018   BILITOT 0.5 01/21/2018   ALKPHOS 64 06/19/2016   AST 38 (H) 01/21/2018   ALT 41 (H) 01/21/2018   PROT 6.5 01/21/2018   ALBUMIN 4.4 06/19/2016   CALCIUM 9.5 01/21/2018   GFRAA 96 01/21/2018    Speciality Comments: PLQ Eye Exam: 08/07/17 PLQ toxcity,  PLQ discotinued @ [facility]  Procedures:  No procedures performed Allergies: Avelox [moxifloxacin hcl in nacl] and Other   Assessment / Plan:     Visit Diagnoses: No diagnosis found.  Orders: No orders of the defined types were placed in this encounter.  No orders of the defined types were placed in this encounter.   Face-to-face time spent with patient was *** minutes. Greater than 50% of time was spent in counseling and coordination of care.  Follow-Up Instructions: No follow-ups on file.   Earnestine Mealing, CMA  Note - This record has been created using Editor, commissioning.  Chart creation errors have been sought, but may not always  have been located. Such creation errors do not reflect on  the standard of medical care.

## 2019-02-04 NOTE — Progress Notes (Deleted)
Office Visit Note  Patient: Rhonda Brewer             Date of Birth: [birth year]           MRN: [id number]             PCP: [name], MD Referring: [name], MD Visit Date: 02/16/2019 Occupation: @GUAROCC @  Subjective:  No chief complaint on file.   History of Present Illness: Rhonda Brewer is a 68 y.o. female ***   Activities of Daily Living:  Patient reports morning stiffness for *** {minute/hour:19697}.   Patient {ACTIONS;DENIES/REPORTS:21021675::"Denies"} nocturnal pain.  Difficulty dressing/grooming: {ACTIONS;DENIES/REPORTS:21021675::"Denies"} Difficulty climbing stairs: {ACTIONS;DENIES/REPORTS:21021675::"Denies"} Difficulty getting out of chair: {ACTIONS;DENIES/REPORTS:21021675::"Denies"} Difficulty using hands for taps, buttons, cutlery, and/or writing: {ACTIONS;DENIES/REPORTS:21021675::"Denies"}  No Rheumatology ROS completed.   PMFS History:  Patient Active Problem List   Diagnosis Date Noted  . Constipation 11/12/2018  . Carpal tunnel syndrome 05/29/2017  . Low back pain without sciatica 04/29/2017  . Screening for breast cancer 09/04/2016  . Dyspnea and respiratory abnormalities 08/16/2016  . LUQ abdominal pain 06/26/2016  . H/O cardiac radiofrequency ablation 06/26/2016  . CAD (coronary artery disease), native coronary artery 06/19/2016  . History of hypothyroidism 06/19/2016  . History of uterine cancer 06/19/2016  . Scleroderma ([place]) 06/16/2016  . High risk medication use 06/16/2016  . Osteopenia of multiple sites 06/16/2016    Past Medical History:  Diagnosis Date  . Allergy   . Arthritis   . CAD (coronary artery disease)    a. DES to RCA 11/2015  . Cancer [facility])    Uterine 2008  . Heart murmur   . Hyperlipidemia   . Osteoporosis   . Thyroid disease     Family History  Problem Relation Age of Onset  . Asthma Maternal Aunt   . Heart disease Mother   . Diabetes Mother   . Heart disease Father   . Stomach cancer Brother   .  Diabetes Brother   . Colon cancer Neg Hx   . Esophageal cancer Neg Hx   . Rectal cancer Neg Hx    Past Surgical History:  Procedure Laterality Date  . ABDOMINAL HYSTERECTOMY    . APPENDECTOMY    . CARDIAC ELECTROPHYSIOLOGY MAPPING AND ABLATION  04/19/2003  . CARPAL TUNNEL RELEASE Right 10/15/2017  . CORONARY ANGIOPLASTY WITH STENT PLACEMENT    . RIGHT/LEFT HEART CATH AND CORONARY ANGIOGRAPHY [name] 08/14/2017   Procedure: RIGHT/LEFT HEART CATH AND CORONARY ANGIOGRAPHY;  Surgeon: Jolaine Artist, MD;  Location: [place] [facility];  Service: Cardiovascular;  Laterality: [name];  . ULTRASOUND GUIDANCE FOR VASCULAR ACCESS  08/14/2017   Procedure: Ultrasound Guidance For Vascular Access;  Surgeon: Jolaine Artist, MD;  Location: [place] [facility];  Service: Cardiovascular;;   Social History   Social History Narrative   Lives in home with husband. Daughters live out of state. Step-daughter in [place]. Has 8 grandchildren. Lives in 2 level home but most living is on first floor. Stairs have handrails, no problem with stairs. No grab bars in bathroom. No tripping hazards. Smoke alarms present.   No pets.    Eats a good variety of foods, husband is a cook. Eats meats, fruits, vegetables. Drinks water mostly. Occasional tea.   Goes to gym 5 times a week. Enjoys reading, sewing, movies. Walking when weather is nice.    Wears seat belt in car.    Immunization History  Administered Date(s) Administered  . Influenza Split 01/17/2016  . Influenza,inj,Quad  PF,6+ Mos 11/12/2018  . Influenza-Unspecified 04/02/2017, 12/08/2017  . Pneumococcal Conjugate-13 10/26/2014  . Pneumococcal-Unspecified 09/14/2013  . Td 07/18/2011     Objective: Vital Signs: There were no vitals taken for this visit.   Physical Exam   Musculoskeletal Exam: ***  CDAI Exam: CDAI Score: - Patient Global: -; Provider Global: - Swollen: -; Tender: - Joint Exam   No joint exam has been documented for this visit   There  is currently no information documented on the homunculus. Go to the Rheumatology activity and complete the homunculus joint exam.  Investigation: No additional findings.  Imaging: No results found.  Recent Labs: Lab Results  Component Value Date   WBC 6.6 01/21/2018   HGB 14.0 01/21/2018   PLT 174 01/21/2018   NA 141 01/21/2018   K 4.4 01/21/2018   CL 110 01/21/2018   CO2 22 01/21/2018   GLUCOSE 93 01/21/2018   BUN 18 01/21/2018   CREATININE 0.75 01/21/2018   BILITOT 0.5 01/21/2018   ALKPHOS 64 06/19/2016   AST 38 (H) 01/21/2018   ALT 41 (H) 01/21/2018   PROT 6.5 01/21/2018   ALBUMIN 4.4 06/19/2016   CALCIUM 9.5 01/21/2018   GFRAA 96 01/21/2018    Speciality Comments: PLQ Eye Exam: 08/07/17 PLQ toxcity,  PLQ discotinued @ [facility]  Procedures:  No procedures performed Allergies: Avelox [moxifloxacin hcl in nacl] and Other   Assessment / Plan:     Visit Diagnoses: No diagnosis found.  Orders: No orders of the defined types were placed in this encounter.  No orders of the defined types were placed in this encounter.   Face-to-face time spent with patient was *** minutes. Greater than 50% of time was spent in counseling and coordination of care.  Follow-Up Instructions: No follow-ups on file.   Ofilia Neas, PA-C  Note - This record has been created using Dragon software.  Chart creation errors have been sought, but may not always  have been located. Such creation errors do not reflect on  the standard of medical care.

## 2019-02-08 ENCOUNTER — Ambulatory Visit: Payer: Medicare Other | Admitting: Physician Assistant

## 2019-02-09 DIAGNOSIS — M545 Low back pain: Secondary | ICD-10-CM | POA: Diagnosis not present

## 2019-02-16 ENCOUNTER — Ambulatory Visit: Payer: Medicare Other | Admitting: Rheumatology

## 2019-02-27 ENCOUNTER — Other Ambulatory Visit (HOSPITAL_COMMUNITY)
Admission: RE | Admit: 2019-02-27 | Discharge: 2019-02-27 | Disposition: A | Discharge status: Home / Self Care | Payer: Medicare Other | Source: Ambulatory Visit | Attending: Acute Care | Admitting: Acute Care

## 2019-02-27 DIAGNOSIS — Z20828 Contact with and (suspected) exposure to other viral communicable diseases: Secondary | ICD-10-CM | POA: Diagnosis not present

## 2019-02-27 DIAGNOSIS — Z01812 Encounter for preprocedural laboratory examination: Secondary | ICD-10-CM | POA: Insufficient documentation

## 2019-02-28 LAB — NOVEL CORONAVIRUS, NAA (HOSP ORDER, SEND-OUT TO REF LAB; TAT 18-24 HRS): SARS-CoV-2, NAA: NOT DETECTED

## 2019-03-02 ENCOUNTER — Other Ambulatory Visit: Payer: Self-pay | Admitting: *Deleted

## 2019-03-02 DIAGNOSIS — R0600 Dyspnea, unspecified: Secondary | ICD-10-CM

## 2019-03-02 DIAGNOSIS — M349 Systemic sclerosis, unspecified: Secondary | ICD-10-CM

## 2019-03-03 ENCOUNTER — Other Ambulatory Visit: Payer: Self-pay

## 2019-03-03 ENCOUNTER — Ambulatory Visit (INDEPENDENT_AMBULATORY_CARE_PROVIDER_SITE_OTHER): Payer: Medicare Other | Admitting: Internal Medicine

## 2019-03-03 ENCOUNTER — Encounter: Payer: Self-pay | Admitting: Acute Care

## 2019-03-03 ENCOUNTER — Ambulatory Visit: Payer: Medicare Other | Admitting: Acute Care

## 2019-03-03 VITALS — BP 125/78 | HR 63 | Temp 97.0°F | Ht 64.0 in | Wt 140.0 lb

## 2019-03-03 DIAGNOSIS — R0689 Other abnormalities of breathing: Secondary | ICD-10-CM

## 2019-03-03 DIAGNOSIS — M349 Systemic sclerosis, unspecified: Secondary | ICD-10-CM

## 2019-03-03 DIAGNOSIS — R0600 Dyspnea, unspecified: Secondary | ICD-10-CM | POA: Diagnosis not present

## 2019-03-03 LAB — PULMONARY FUNCTION TEST
DL/VA % pred: 93 %
DL/VA: 3.87 ml/min/mmHg/L
DLCO unc % pred: 85 %
DLCO unc: 16.78 ml/min/mmHg
FEF 25-75 Pre: 2.24 L/sec
FEF2575-%Pred-Pre: 112 %
FEV1-%Pred-Pre: 99 %
FEV1-Pre: 2.32 L
FEV1FVC-%Pred-Pre: 104 %
FEV6-%Pred-Pre: 98 %
FEV6-Pre: 2.89 L
FEV6FVC-%Pred-Pre: 104 %
FVC-%Pred-Pre: 94 %
FVC-Pre: 2.9 L
Pre FEV1/FVC ratio: 80 %
Pre FEV6/FVC Ratio: 100 %

## 2019-03-03 NOTE — Patient Instructions (Addendum)
It is good to see you today. Your Pulmonary function tests are actually better than your 2018 PFT Continue walking every day as you have been doing This is clearly good for your lungs.  Use your albuterol rescue inhaler as needed for shortness of breath or wheezing. Continue your Synthroid and Protonix as you have been doing. Remember to have your eyes checked annually, as you have been doing  I will discuss your PFT's with Dr. Chase Caller and see if he wants to make any changes  Follow up in 12 months with Dr. Chase Caller 02/2020 Remember you can call anytime if you need Rhonda Brewer sooner.  Please contact office for sooner follow up if symptoms do not improve or worsen or seek emergency care  Have a happy holiday Let Rhonda Brewer know if you need a refill on your albuterol.  Please contact office for sooner follow up if symptoms do not improve or worsen or seek emergency care

## 2019-03-03 NOTE — Progress Notes (Signed)
Spiro/DLCO performed today. 

## 2019-03-03 NOTE — Progress Notes (Signed)
History of Present Illness Rhonda Brewer is a 68 y.o. female with scleroderma. She is followed by Dr. Chase Caller  Synopsis HPI 68 year old woman with history of scleroderma on hydroxychloroquine, CAD s/p stent 11/2015, uterine cancer dx in 2008 s/p hysterectomy, chemo/radtx presenting for evaluation of dyspnea on exertion. She had been on Plaquenil and she stopped that last year ( 2019)at the recommendation of her  Eye doctor.  03/03/2019  Pt. Presents for annual follow up after PFT's.  She states she has been doing well. She has been walking 3-4 miles a day for the last 10 months since the pandemic started.Upon review of her PFT's she has had significant improvement.DLCO has improved from 63% to 85%.  PFT's do not numerically validate obstruction, but the flow curve does show extra-thoracic obstruction. This was also present on her previous spirometry in 2018. She does not have a history of intubation. She does have a history of relux and she does take protonix. She is no longer on her Plaquenil as her eye doctor saw changes in her eyes and requested that she stop the medication. She has been off the plaquenil x 1 year. She does go for annual eye exams.She rarely uses her albuterol ( 2 times in the last 6 months).She does have some dyspnea only with what she considers extreme exertion, like after climbing a flight of stairs. She has been masking and maintaining social distancing.  She has not yet had her shingles vaccine. I have asked her to follow up with Dr. Andria Frames.  She has had flu vaccine and pneumovax 10/2018.   Test Results: HRCT No evidence of interstitial lung disease. No acute pulmonary disease . Solitary 3 mm solid apical right upper lobe pulmonary nodule. No follow-up needed if patient is low-risk. Non-contrast chest CT can be considered in 12 months if patient is high-risk. This recommendation follows the consensus statement: Guidelines for Management of Incidental Pulmonary  Nodules Detected on CT Images: From the Fleischner Society 2017; Radiology 2017; 284:228-243. Small pericardial effusion/thickening. Three-vessel coronary atherosclerosis.  Aortic Atherosclerosis (ICD10-I70.0).  PFT 03/03/2019 Results for Rhonda, Brewer (MRN [id number]) as of 03/03/2019 15:06  Ref. Range 03/03/2019 14:00  FVC-Pre Latest Units: L 2.90  FVC-%Pred-Pre Latest Units: % 94  FEV1-Pre Latest Units: L 2.32  FEV1-%Pred-Pre Latest Units: % 99  Pre FEV1/FVC ratio Latest Units: % 80  FEV1FVC-%Pred-Pre Latest Units: % 104  FEF 25-75 Pre Latest Units: L/sec 2.24  FEF2575-%Pred-Pre Latest Units: % 112  FEV6-Pre Latest Units: L 2.89  FEV6-%Pred-Pre Latest Units: % 98  Pre FEV6/FVC Ratio Latest Units: % 100  FEV6FVC-%Pred-Pre Latest Units: % 104  DLCO unc Latest Units: ml/min/mmHg 16.78  DLCO unc % pred Latest Units: % 85  DL/VA Latest Units: ml/min/mmHg/L 3.87  DL/VA % pred Latest Units: % 93   08/27/2016 PFT's   Results for Rhonda, Brewer (MRN [id number]) as of 03/03/2019 15:06  Ref. Range 08/27/2016 11:19  FVC-Pre Latest Units: L 2.86  FVC-%Pred-Pre Latest Units: % 91  FEV1-Pre Latest Units: L 2.11  FEV1-%Pred-Pre Latest Units: % 88  Pre FEV1/FVC ratio Latest Units: % 74  FEV1FVC-%Pred-Pre Latest Units: % 95  FEF 25-75 Pre Latest Units: L/sec 1.99  FEF2575-%Pred-Pre Latest Units: % 94  FEV6-Pre Latest Units: L 2.69  FEV6-%Pred-Pre Latest Units: % 89  Pre FEV6/FVC Ratio Latest Units: % 100  FEV6FVC-%Pred-Pre Latest Units: % 104  FVC-Post Latest Units: L 2.98  FVC-%Pred-Post Latest Units: % 94  FVC-%Change-Post Latest Units: % 4  FEV1-Post Latest Units:  L 2.45  FEV1-%Pred-Post Latest Units: % 101  FEV1-%Change-Post Latest Units: % 15  Post FEV1/FVC ratio Latest Units: % 82  FEV1FVC-%Change-Post Latest Units: % 10  FEF 25-75 Post Latest Units: L/sec 2.89  FEF2575-%Pred-Post Latest Units: % 137  FEF2575-%Change-Post Latest Units: % 45  FEV6-Post Latest Units: L 2.96   FEV6-%Pred-Post Latest Units: % 98  FEV6-%Change-Post Latest Units: % 9  Post FEV6/FVC ratio Latest Units: % 99  FEV6FVC-%Pred-Post Latest Units: % 103  FEV6FVC-%Change-Post Latest Units: % 0  TLC Latest Units: L 4.64  TLC % pred Latest Units: % 91  RV Latest Units: L 1.69  RV % pred Latest Units: % 80  DLCO unc Latest Units: ml/min/mmHg 15.42  DLCO unc % pred Latest Units: % 63  DL/VA Latest Units: ml/min/mmHg/L 3.39  DL/VA % pred Latest Units: % 70    CBC Latest Ref Rng & Units 01/21/2018 08/14/2017 05/01/2017  WBC 3.8 - 10.8 Thousand/uL 6.6 6.3 4.7  Hemoglobin 11.7 - 15.5 g/dL 14.0 14.9 14.5  Hematocrit 35.0 - 45.0 % 40.8 44.6 42.1  Platelets 140 - 400 Thousand/uL 174 196 173    BMP Latest Ref Rng & Units 01/21/2018 08/14/2017 05/01/2017  Glucose 65 - 99 mg/dL 93 91 94  BUN 7 - 25 mg/dL 18 13 16   Creatinine 0.50 - 0.99 mg/dL 0.75 0.76 0.82  BUN/Creat Ratio 6 - 22 (calc) NOT APPLICABLE - NOT APPLICABLE  Sodium A999333 - 146 mmol/L 141 140 140  Potassium 3.5 - 5.3 mmol/L 4.4 4.3 4.4  Chloride 98 - 110 mmol/L 110 108 106  CO2 20 - 32 mmol/L 22 25 25   Calcium 8.6 - 10.4 mg/dL 9.5 9.5 9.6    BNP No results found for: BNP  ProBNP No results found for: PROBNP  PFT    Component Value Date/Time   FEV1PRE 2.32 03/03/2019 1400   FEV1POST 2.45 08/27/2016 1119   FVCPRE 2.90 03/03/2019 1400   FVCPOST 2.98 08/27/2016 1119   TLC 4.64 08/27/2016 1119   DLCOUNC 16.78 03/03/2019 1400   PREFEV1FVCRT 80 03/03/2019 1400   PSTFEV1FVCRT 82 08/27/2016 1119    No results found.   Past medical hx Past Medical History:  Diagnosis Date  . Allergy   . Arthritis   . CAD (coronary artery disease)    a. DES to RCA 11/2015  . Cancer [facility])    Uterine 2008  . Heart murmur   . Hyperlipidemia   . Osteoporosis   . Thyroid disease      Social History   Tobacco Use  . Smoking status: Never Smoker  . Smokeless tobacco: Never Used  Substance Use Topics  . Alcohol use: Yes    Comment:  occasional wine  . Drug use: No    Ms.Dirks reports that she has never smoked. She has never used smokeless tobacco. She reports current alcohol use. She reports that she does not use drugs.  Tobacco Cessation: Never smoker  Past surgical hx, Family hx, Social hx all reviewed.  Current Outpatient Medications on File Prior to Visit  Medication Sig  . albuterol (PROVENTIL HFA;VENTOLIN HFA) 108 (90 Base) MCG/ACT inhaler INHALE 2 PUFFS INTO THE LUNGS EVERY 4 HOURS AS NEEDED FOR WHEEZING OR SHORTNESS OF BREATH  . aspirin EC 81 MG tablet Take 81 mg by mouth daily.   Marland Kitchen atorvastatin (LIPITOR) 40 MG tablet TAKE 1 TABLET BY MOUTH  DAILY  . Calcium Carbonate-Vitamin D (CALCIUM-VITAMIN D3 PO) Take 1 tablet by mouth daily.  . cycloSPORINE (RESTASIS)  0.05 % ophthalmic emulsion Place 1 drop into both eyes 2 (two) times daily.  Marland Kitchen levothyroxine (SYNTHROID) 75 MCG tablet TAKE 1 TABLET BY MOUTH  DAILY BEFORE BREAKFAST  . Multiple Vitamin (MULTI-VITAMINS) TABS Take 1 tablet by mouth daily.   . pantoprazole (PROTONIX) 40 MG tablet Take 1 tablet (40 mg total) by mouth daily.  . vitamin B-12 (CYANOCOBALAMIN) 100 MCG tablet Take 100 mcg by mouth daily.   No current facility-administered medications on file prior to visit.     Allergies  Allergen Reactions  . Avelox [Moxifloxacin Hcl In Nacl] Other (See Comments)    dizziness  . Other Other (See Comments)    SEASONAL     Review Of Systems:  Constitutional:   No  weight loss, night sweats,  Fevers, chills, fatigue, or  lassitude.  HEENT:   No headaches,  Difficulty swallowing,  Tooth/dental problems, or  Sore throat,                No sneezing, itching, ear ache, nasal congestion, post nasal drip,   CV:  No chest pain,  Orthopnea, PND, swelling in lower extremities, anasarca, dizziness, palpitations, syncope.   GI  No heartburn, indigestion, abdominal pain, nausea, vomiting, diarrhea, change in bowel habits, loss of appetite, bloody stools.    Resp: rare  shortness of breath with extreme exertion none  at rest.  No excess mucus, no productive cough,  No non-productive cough,  No coughing up of blood.  No change in color of mucus.  No wheezing.  No chest wall deformity  Skin: no rash or lesions.  GU: no dysuria, change in color of urine, no urgency or frequency.  No flank pain, no hematuria   MS:  No joint pain or swelling.  No decreased range of motion.  No back pain.  Psych:  No change in mood or affect. No depression or anxiety.  No memory loss.   Vital Signs BP 125/78 (BP Location: Left Arm, Cuff Size: Normal)   Pulse 63   Temp (!) 97 F (36.1 C) (Temporal)   Ht 5\' 4"  (1.626 m)   Wt 140 lb (63.5 kg)   SpO2 98%   BMI 24.03 kg/m    Physical Exam:  General- No distress,  A&Ox3, pleasant ENT: No sinus tenderness, TM clear, pale nasal mucosa, no oral exudate,no post nasal drip, no LAN Cardiac: S1, S2, regular rate and rhythm, no murmur Chest: No wheeze/ rales/ dullness; no accessory muscle use, no nasal flaring, no sternal retractions Abd.: Soft Non-tender, ND, BS +, Body mass index is 24.03 kg/m. Ext: No clubbing cyanosis, edema Neuro:  normal strength, MAE x 4, A&O x 3, appropriate Skin: No rashes, No lesions, warm and dry Psych: normal mood and behavior   Assessment/Plan Scleroderma Stable interval Improved PFT's with walking 3-4 miles every day Plan Your Pulmonary function tests are actually better than your 2018 PFT Continue walking every day as you have been doing This is clearly good for your lungs.  Use your albuterol rescue inhaler as needed for shortness of breath or wheezing. Continue your Synthroid and Protonix as you have been doing. Remember to have your eyes checked annually, as you have been doing  I will discuss your PFT's with Dr. Chase Caller and see if he wants to make any changes  Follow up in 12 months with Dr. Chase Caller 02/2020 Remember you can call anytime if you need [place] sooner.   Please contact office for sooner follow up if symptoms do  not improve or worsen or seek emergency care   Let us know if you need a refill on your albuterol.  Please contact office for sooner follow up if symptoms do not improve or worsen or seek emergency care    Magdalen Spatz, NP 03/03/2019  2:35 PM

## 2019-03-15 DIAGNOSIS — M545 Low back pain: Secondary | ICD-10-CM | POA: Diagnosis not present

## 2019-03-24 ENCOUNTER — Encounter (HOSPITAL_COMMUNITY): Payer: Self-pay | Admitting: Internal Medicine

## 2019-03-24 ENCOUNTER — Ambulatory Visit (HOSPITAL_COMMUNITY)
Admission: RE | Admit: 2019-03-24 | Discharge: 2019-03-24 | Disposition: A | Discharge status: Home / Self Care | Payer: Medicare Other | Source: Ambulatory Visit | Attending: Internal Medicine | Admitting: Internal Medicine

## 2019-03-24 ENCOUNTER — Other Ambulatory Visit: Payer: Self-pay

## 2019-03-24 VITALS — BP 110/61 | HR 66 | Wt 139.2 lb

## 2019-03-24 DIAGNOSIS — Z8249 Family history of ischemic heart disease and other diseases of the circulatory system: Secondary | ICD-10-CM | POA: Diagnosis not present

## 2019-03-24 DIAGNOSIS — I471 Supraventricular tachycardia: Secondary | ICD-10-CM | POA: Insufficient documentation

## 2019-03-24 DIAGNOSIS — E785 Hyperlipidemia, unspecified: Secondary | ICD-10-CM | POA: Insufficient documentation

## 2019-03-24 DIAGNOSIS — E079 Disorder of thyroid, unspecified: Secondary | ICD-10-CM | POA: Diagnosis not present

## 2019-03-24 DIAGNOSIS — Z7982 Long term (current) use of aspirin: Secondary | ICD-10-CM | POA: Diagnosis not present

## 2019-03-24 DIAGNOSIS — Z955 Presence of coronary angioplasty implant and graft: Secondary | ICD-10-CM | POA: Diagnosis not present

## 2019-03-24 DIAGNOSIS — M349 Systemic sclerosis, unspecified: Secondary | ICD-10-CM | POA: Insufficient documentation

## 2019-03-24 DIAGNOSIS — M1990 Unspecified osteoarthritis, unspecified site: Secondary | ICD-10-CM | POA: Insufficient documentation

## 2019-03-24 DIAGNOSIS — I110 Hypertensive heart disease with heart failure: Secondary | ICD-10-CM | POA: Insufficient documentation

## 2019-03-24 DIAGNOSIS — Z79899 Other long term (current) drug therapy: Secondary | ICD-10-CM | POA: Insufficient documentation

## 2019-03-24 DIAGNOSIS — Z7902 Long term (current) use of antithrombotics/antiplatelets: Secondary | ICD-10-CM | POA: Insufficient documentation

## 2019-03-24 DIAGNOSIS — R0689 Other abnormalities of breathing: Secondary | ICD-10-CM | POA: Insufficient documentation

## 2019-03-24 DIAGNOSIS — I2510 Atherosclerotic heart disease of native coronary artery without angina pectoris: Secondary | ICD-10-CM | POA: Insufficient documentation

## 2019-03-24 DIAGNOSIS — R0600 Dyspnea, unspecified: Secondary | ICD-10-CM | POA: Diagnosis not present

## 2019-03-24 NOTE — Patient Instructions (Signed)
Your physician has recommended that you have a pulmonary function test. Pulmonary Function Tests are a group of tests that measure how well air moves in and out of your lungs. This should be done around the time of your next appointment.  Your physician has requested that you have an echocardiogram. Echocardiography is a painless test that uses sound waves to create images of your heart. It provides your doctor with information about the size and shape of your heart and how well your heart's chambers and valves are working. This procedure takes approximately one hour. There are no restrictions for this procedure. This will be done at your follow up appointment in 1 year.  Please follow up with the [facility] in 1 year with an echocardiogram.  At the [facility], you and your health needs are our priority. As part of our continuing mission to provide you with exceptional heart care, we have created designated Provider Care Teams. These Care Teams include your primary Cardiologist (physician) and Advanced Practice Providers (APPs- Physician Assistants and Nurse Practitioners) who all work together to provide you with the care you need, when you need it.   You may see any of the following providers on your designated Care Team at your next follow up: Marland Kitchen Dr Glori Bickers . Dr Loralie Champagne . Darrick Grinder, NP . Lyda Jester, PA . Audry Riles, PharmD   Please be sure to bring in all your medications bottles to every appointment.

## 2019-03-24 NOTE — Progress Notes (Signed)
PCP: Dr. Andria Frames Referring: Dr. Kirke Corin    HPI:  Rhonda Brewer is a 69 y/o woman with h/o scleroderma, CAD s/p stent 9/17 , SVT s/p ablation 2/05 referred by Dr. Patrecia Pour for screening for [facility] in setting of scleroderma.   Previously lived in [place]. 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 At time had exertional fatigue and dyspnea and arm tingling. No CP.    Cath 11/24/15 LM: mild irregs LAD: 20% mid LCX: mild irreg RCA: mRCA 99% ->Xience Alpine DES 3.5x84mm  Echo 11/22/15: LVEF 55-60% Mild AI. Normal RV. Mild TR.  Echo 08/27/16: EF 60-65%,  RV normal Mild AI. No RV strain or PAH.   PFTs 08/2016 FEV1 2.11 (88%) FVC 2.86 (91%) DLCO 63%  PFTs 12/20 FEV1 2.32 (99%) FVC 2.90 (94%) DLCO 85%  She was seen in the HF clinic for initial evaluation in June 2018. It was felt that her dyspnea could be due to Brilinta. She was switched to Plavix. Chest CT was ordered and showed no interstitial lung disease.   Since we last saw her underwent cath 5/19 which showed very mild CAD. No evidence. There was a small mstep-up in saturations at RA level suggestive of anomalous pulmonary vein or shunt at atrial level    Mid RCA lesion is 20% stenosed.  Prox LAD lesion is 40% stenosed.  Here for routine f/u. Doing great. Walks 3-5 miles per day and exercises with bands. No CP. Mild dyspnea on walking stairs but otherwise fine. Recent PFTs with normalization of DLCO.    Cath 5/19:  Ao = 137/66 (94) LV = 141/13 RA = 2 RV = 20/4 PA = 19/6 (12) PCW = 7 Fick cardiac output/index = 5.3/3.1 Thermo CO/CI = 3.6/2.2 PVR = 0.7 WU SVR 1402 Ao sat = 99% PA sat = 79%, 80% High SVC sat = 68% RA sat = 78% RV sat = 75% Qp/Qs = 1.11 Assessment:  1. CAD with patent RCA stent and mild non-obstructive CAD in LAD 2. Normal LVEF 60-65% 3. Normal right heart pressures with no evidence of PAH 4. Small  step-up in saturations at RA level suggestive of anomalous pulmonary vein or shunt at atrial level      Past Medical History:  Diagnosis Date  . Allergy   . Arthritis   . CAD (coronary artery disease)    a. DES to RCA 11/2015  . [facility])    Uterine 2008  . Heart murmur   . Hyperlipidemia   . Osteoporosis   . Thyroid disease     Current Outpatient Medications  Medication Sig Dispense Refill  . albuterol (PROVENTIL HFA;VENTOLIN HFA) 108 (90 Base) MCG/ACT inhaler INHALE 2 PUFFS INTO THE LUNGS EVERY 4 HOURS AS NEEDED FOR WHEEZING OR SHORTNESS OF BREATH 90 g 6  . aspirin EC 81 MG tablet Take 81 mg by mouth daily.     Marland Kitchen atorvastatin (LIPITOR) 40 MG tablet TAKE 1 TABLET BY MOUTH  DAILY 90 tablet 3  . Calcium Carbonate-Vitamin D (CALCIUM-VITAMIN D3 PO) Take 1 tablet by mouth daily.    . cycloSPORINE (RESTASIS) 0.05 % ophthalmic emulsion Place 1 drop into both eyes 2 (two) times daily.    Marland Kitchen levothyroxine (SYNTHROID) 75 MCG tablet TAKE 1 TABLET BY MOUTH  DAILY BEFORE BREAKFAST 90 tablet 4  . Multiple Vitamin (MULTI-VITAMINS) TABS Take 1 tablet by mouth daily.     Marland Kitchen  pantoprazole (PROTONIX) 40 MG tablet Take 1 tablet (40 mg total) by mouth daily. 90 tablet 3  . vitamin B-12 (CYANOCOBALAMIN) 100 MCG tablet Take 100 mcg by mouth daily.     No current facility-administered medications for this encounter.     Allergies  Allergen Reactions  . Avelox [Moxifloxacin Hcl In Nacl] Other (See Comments)    dizziness  . Other Other (See Comments)    SEASONAL     Social History   Socioeconomic History  . Marital status: Married    Spouse name: Cay Schillings  . Number of children: 2  . Years of education: 19  . Highest education level: 12th grade  Occupational History  . Occupation: retired    Comment: Scientist, water quality  Tobacco Use  . Smoking status: Never Smoker  . Smokeless tobacco: Never Used  Substance and Sexual Activity  . Alcohol use: Yes    Comment: occasional wine  . Drug  use: No  . Sexual activity: Yes    Birth control/protection: Surgical    Comment: hysterectomy  Other Topics Concern  . Not on file  Social History Narrative   Lives in home with husband. Daughters live out of state. Step-daughter in [place]. Has 8 grandchildren. Lives in 2 level home but most living is on first floor. Stairs have handrails, no problem with stairs. No grab bars in bathroom. No tripping hazards. Smoke alarms present.   No pets.    Eats a good variety of foods, husband is a cook. Eats meats, fruits, vegetables. Drinks water mostly. Occasional tea.   Goes to gym 5 times a week. Enjoys reading, sewing, movies. Walking when weather is nice.    Wears seat belt in car.    Social Determinants of Health   Financial Resource Strain:   . Difficulty of Paying Living Expenses: Not on file  Food Insecurity:   . Worried About Charity fundraiser in the Last Year: Not on file  . Ran Out of Food in the Last Year: Not on file  Transportation Needs:   . Lack of Transportation (Medical): Not on file  . Lack of Transportation (Non-Medical): Not on file  Physical Activity:   . Days of Exercise per Week: Not on file  . Minutes of Exercise per Session: Not on file  Stress:   . Feeling of Stress : Not on file  Social Connections:   . Frequency of Communication with Friends and Family: Not on file  . Frequency of Social Gatherings with Friends and Family: Not on file  . Attends Religious Services: Not on file  . Active Member of Clubs or Organizations: Not on file  . Attends Archivist Meetings: Not on file  . Marital Status: Not on file  Intimate Partner Violence:   . Fear of Current or Ex-Partner: Not on file  . Emotionally Abused: Not on file  . Physically Abused: Not on file  . Sexually Abused: Not on file    Family History  Problem Relation Age of Onset  . Asthma Maternal Aunt   . Heart disease Mother   . Diabetes Mother   . Heart disease Father   . Stomach cancer  Brother   . Diabetes Brother   . Colon cancer Neg Hx   . Esophageal cancer Neg Hx   . Rectal cancer Neg Hx     PHYSICAL EXAM: Vitals:   03/24/19 1340  BP: 110/61  Pulse: 66  SpO2: 97%    General:  Well appearing.  No resp difficulty HEENT: normal +telangectacias  Neck: supple. no JVD. Carotids 2+ bilat; no bruits. No lymphadenopathy or thryomegaly appreciated. Cor: PMI nondisplaced. Regular rate & rhythm. No rubs, gallops or murmurs. Lungs: clear Abdomen: soft, nontender, nondistended. No hepatosplenomegaly. No bruits or masses. Good bowel sounds. Extremities: no cyanosis, clubbing, rash, edema mild skin thickening  Neuro: alert & orientedx3, cranial nerves grossly intact. moves all 4 extremities w/o difficulty. Affect pleasant    ASSESSMENT & PLAN:  1. CAD s/p RCA stent in 11/2015: - repeat cath 5/19 for recurrent CP show mild non-obstructive CAD - results reviewed with her - very active. No s/s ischemia  - Continue ASA and atorvastatin 40mg  daily  2. Scleroderma - DLCO mildly reduced on PFTs 6/18. No R heart strain on echo. CT without ILD.  - RHC 5/19 no PAH - PFTs 12/20 with normalization of DLCO.  - No evidence of scleroderma-related lung disease (PAH or fibrosis). Will repeat PFTs and echo in 1 year for routine screening  3. Hypertension - Blood pressure well controlled. Continue current regimen.  Glori Bickers, MD  2:32 PM

## 2019-03-24 NOTE — Addendum Note (Signed)
Encounter addended by: Marlise Eves, RN on: 03/24/2019 2:44 PM  Actions taken: Clinical Note Signed

## 2019-03-24 NOTE — Addendum Note (Signed)
Encounter addended by: Marlise Eves, RN on: 03/24/2019 2:47 PM  Actions taken: Order list changed, Diagnosis association updated

## 2019-03-25 ENCOUNTER — Encounter: Payer: Self-pay | Admitting: Family Medicine

## 2019-03-26 ENCOUNTER — Telehealth: Payer: Self-pay

## 2019-03-26 NOTE — Telephone Encounter (Signed)
Per Dr. Andria Frames request. [place] has been placed to be scanned into her chart. Ottis Stain, CMA   Dr. Andria Frames,  Happy New Year!  Attached is my Clinical research associate regarding a natural death for my files. Any questions, call me.  Best,  [place] [facility] [place].Marland KitchenMarland Kitchen

## 2019-04-02 ENCOUNTER — Encounter: Payer: Self-pay | Admitting: Family Medicine

## 2019-04-07 NOTE — Progress Notes (Signed)
Virtual Visit via Telephone Note  I connected with Rhonda Brewer on 04/14/19 at  2:15 PM EST by telephone and verified that I am speaking with the correct person using two identifiers.  Location: Patient: Home Provider: Clinic  This service was conducted via virtual visit. The patient was located at home. I was located in my office.  Consent was obtained prior to the virtual visit and is aware of possible charges through their insurance for this visit.  The patient is an established patient.  Dr. Estanislado Pandy, MD conducted the virtual visit and Hazel Sams, PA-C acted as scribe during the service.  Office staff helped with scheduling follow up visits after the service was conducted.     I discussed the limitations, risks, security and privacy concerns of performing an evaluation and management service by telephone and the availability of in person appointments. I also discussed with the patient that there may be a patient responsible charge related to this service. The patient expressed understanding and agreed to proceed.  CC: Raynaud's  History of Present Illness: Rhonda Brewer is a 69 y.o. female with history of scleroderma and Raynaud's. She denies any new or worsening symptoms since her last visit.  She has had routine follow up visits with her cardiologist and pulmonologist recently.  She denies any increased skin tightness or skin thickness.  She has not had any recent rashes.  She has intermittent symptoms of Raynaud's but no ulcerations.  Her symptoms have been manageable. She has stated experiencing triggering and tenderness in the right middle finger and would like to schedule a cortisone injection. She denies any other joint pain or joint swelling at this time.  Review of Systems  Constitutional: Negative for fever and malaise/fatigue.  HENT: Negative for congestion.   Eyes: Negative for photophobia, pain, discharge and redness.  Respiratory: Negative for cough, shortness of breath and  wheezing.   Cardiovascular: Negative for chest pain, palpitations and leg swelling.  Gastrointestinal: Negative for blood in stool, constipation and diarrhea.  Genitourinary: Negative for dysuria and frequency.  Musculoskeletal: Positive for joint pain. Negative for back pain, myalgias and neck pain.  Skin: Negative for rash.       +raynaud's   Neurological: Negative for dizziness, weakness and headaches.  Endo/Heme/Allergies: Does not bruise/bleed easily.  Psychiatric/Behavioral: Negative for depression and memory loss. The patient is not nervous/anxious and does not have insomnia.      Observations/Objective:  Physical Exam  Constitutional: She is oriented to person, place, and time.  Neurological: She is alert and oriented to person, place, and time.  Psychiatric: Mood, memory, affect and judgment normal.     Patient reports morning stiffness for 30 minutes.   Patient reports nocturnal pain.  Difficulty dressing/grooming: Denies Difficulty climbing stairs: Denies Difficulty getting out of chair: Denies Difficulty using hands for taps, buttons, cutlery, and/or writing: Denies  Assessment and Plan: Visit Diagnoses: Scleroderma ([place]) - Limited systemic with Raynauds, Telengectesia's, sclerodactyly, arthralgias, erosions in right fifth and left third DIP, ANA centromere: She has not noticed any progression in sclerodactyly since her last visit. She continues to have intermittent symptoms of Raynaud's but her symptoms have been manageable.  She has not had any digital ulcerations or signs of gangrene. She has not had any recent rashes.  She has not had any increased joint pain or inflammation recently.  She has been off of PLQ since May 2019 per recommendation of ophthalmologist.  She does not require immunosuppression at this time.   She had  a follow up appointment with Eric Form, NP at [facility] clinic on 03/03/19.  Upon chart review the patient has had an improvement in PFTs  since 2018. Normalization of DLCO. She was encouraged to continue to walk daily for exercise.  She will follow up with Dr. Chase Caller in 02/2020.   She had a routine follow up visit with Dr.Bensimhon on 03/24/19. Chest CT did not reveal findings consistent with ILD on 10/07/17. [place] 5/19 no PAH.  He recommended repeating PFTs and Echo in 1 year. She was advised to notify us if she develops any new or worsening symptoms.  She will follow up in 4 months.   High risk medication use - Dr. Katy Fitch advised patient to stop taking PLQ.  She has been off of Plaquenil since May 2019.  We will obtain routine lab work at her follow up visit.   Raynaud's phenomenon without gangrene: She has intermittent symptoms of Raynaud's.  She has not had any recent digital ulcerations.  She has been wearing gloves if exposed to cooler temperatures.   Her symptoms have been manageable recently.   Trigger finger, right middle finger: She had a cortisone injection on 09/07/2018. Resolved.   Trigger middle finger of left hand - Resolved.   Trigger finger, right ring finger: She has been experiencing triggering and tenderness intermittently for the past several months.  We will schedule an ultrasound guided cortisone injection in 2-3 weeks.   Pes anserinus bursitis of left knee: Resolved   Thrombocytopenia ([place]): [id number] on 01/21/18.  We will update lab work at follow up visit.   Osteopenia of multiple sites - DEXA 08/2016 Tscore -1.6 left femur.  She is overdue to update DEXA.  Her PCP orders bone densities.   Other medical conditions are listed as follows:   History of uterine cancer  History of coronary artery disease  H/O cardiac radiofrequency ablation  History of hypothyroidism   Follow Up Instructions: She will follow up in 4 months.   Schedule ultrasound guided trigger finger injection.    I discussed the assessment and treatment plan with the patient. The patient was provided an opportunity to ask  questions and all were answered. The patient agreed with the plan and demonstrated an understanding of the instructions.   The patient was advised to call back or seek an in-person evaluation if the symptoms worsen or if the condition fails to improve as anticipated.  I provided 25 minutes of non-face-to-face time during this encounter.  Bo Merino, MD   Scribed byLovena Le Dale,PA-C

## 2019-04-09 ENCOUNTER — Ambulatory Visit: Payer: Medicare Other | Attending: Internal Medicine

## 2019-04-09 DIAGNOSIS — N3010 Interstitial cystitis (chronic) without hematuria: Secondary | ICD-10-CM | POA: Insufficient documentation

## 2019-04-09 DIAGNOSIS — Z23 Encounter for immunization: Secondary | ICD-10-CM | POA: Insufficient documentation

## 2019-04-12 NOTE — Progress Notes (Signed)
   Covid-19 Vaccination Clinic  Name:  Rhonda Brewer    MRN: [id number] DOB: November 16, 1950  04/09/2019  Rhonda Brewer was observed post Covid-19 immunization for 15 minutes without incidence. She was provided with Vaccine Information Sheet and instruction to access the V-Safe system.   Rhonda Brewer was instructed to call 911 with any severe reactions post vaccine: Marland Kitchen Difficulty breathing  . Swelling of your face and throat  . A fast heartbeat  . A bad rash all over your body  . Dizziness and weakness    Immunizations Administered    Name Date Dose VIS Date Route   Moderna COVID-19 Vaccine 04/09/2019  1:22 PM 0.5 mL 02/16/2019 Intramuscular   Manufacturer: Moderna   Lot: [id number]   [id number]

## 2019-04-14 ENCOUNTER — Telehealth: Payer: Self-pay | Admitting: Rheumatology

## 2019-04-14 ENCOUNTER — Telehealth (INDEPENDENT_AMBULATORY_CARE_PROVIDER_SITE_OTHER): Payer: Medicare Other | Admitting: Rheumatology

## 2019-04-14 ENCOUNTER — Other Ambulatory Visit: Payer: Self-pay

## 2019-04-14 ENCOUNTER — Encounter: Payer: Self-pay | Admitting: Rheumatology

## 2019-04-14 DIAGNOSIS — M7052 Other bursitis of knee, left knee: Secondary | ICD-10-CM

## 2019-04-14 DIAGNOSIS — M349 Systemic sclerosis, unspecified: Secondary | ICD-10-CM

## 2019-04-14 DIAGNOSIS — D696 Thrombocytopenia, unspecified: Secondary | ICD-10-CM

## 2019-04-14 DIAGNOSIS — Z79899 Other long term (current) drug therapy: Secondary | ICD-10-CM | POA: Diagnosis not present

## 2019-04-14 DIAGNOSIS — M65331 Trigger finger, right middle finger: Secondary | ICD-10-CM

## 2019-04-14 DIAGNOSIS — Z8542 Personal history of malignant neoplasm of other parts of uterus: Secondary | ICD-10-CM

## 2019-04-14 DIAGNOSIS — I7300 Raynaud's syndrome without gangrene: Secondary | ICD-10-CM | POA: Diagnosis not present

## 2019-04-14 DIAGNOSIS — M65332 Trigger finger, left middle finger: Secondary | ICD-10-CM

## 2019-04-14 DIAGNOSIS — Z9889 Other specified postprocedural states: Secondary | ICD-10-CM

## 2019-04-14 DIAGNOSIS — Z8679 Personal history of other diseases of the circulatory system: Secondary | ICD-10-CM

## 2019-04-14 DIAGNOSIS — M65341 Trigger finger, right ring finger: Secondary | ICD-10-CM

## 2019-04-14 DIAGNOSIS — M8589 Other specified disorders of bone density and structure, multiple sites: Secondary | ICD-10-CM

## 2019-04-14 DIAGNOSIS — Z8639 Personal history of other endocrine, nutritional and metabolic disease: Secondary | ICD-10-CM

## 2019-04-14 NOTE — Telephone Encounter (Signed)
-----   Message from Shona Needles, RT sent at 04/14/2019 12:05 PM EST ----- Regarding: 2-3 [place]

## 2019-04-14 NOTE — Telephone Encounter (Signed)
I LMOM for patient to call, and advise if she can come in Thursday 05/06/19 at 8 am for US guided injection/ labs.

## 2019-04-15 ENCOUNTER — Encounter: Payer: Self-pay | Admitting: Rheumatology

## 2019-04-15 ENCOUNTER — Encounter: Payer: Self-pay | Admitting: Family Medicine

## 2019-04-26 DIAGNOSIS — M545 Low back pain: Secondary | ICD-10-CM | POA: Diagnosis not present

## 2019-04-27 ENCOUNTER — Ambulatory Visit: Payer: Medicare Other

## 2019-04-28 ENCOUNTER — Ambulatory Visit: Payer: Medicare Other

## 2019-05-05 ENCOUNTER — Ambulatory Visit (INDEPENDENT_AMBULATORY_CARE_PROVIDER_SITE_OTHER): Payer: Medicare Other | Admitting: Rheumatology

## 2019-05-05 ENCOUNTER — Other Ambulatory Visit: Payer: Self-pay

## 2019-05-05 ENCOUNTER — Other Ambulatory Visit: Payer: Self-pay | Admitting: *Deleted

## 2019-05-05 DIAGNOSIS — M65341 Trigger finger, right ring finger: Secondary | ICD-10-CM

## 2019-05-05 DIAGNOSIS — Z79899 Other long term (current) drug therapy: Secondary | ICD-10-CM | POA: Diagnosis not present

## 2019-05-05 DIAGNOSIS — M349 Systemic sclerosis, unspecified: Secondary | ICD-10-CM | POA: Diagnosis not present

## 2019-05-05 MED ORDER — LIDOCAINE HCL 1 % IJ SOLN
0.5000 mL | INTRAMUSCULAR | Status: AC | PRN
  Administered 2019-05-05: .5 mL

## 2019-05-05 MED ORDER — TRIAMCINOLONE ACETONIDE 40 MG/ML IJ SUSP
10.0000 mg | INTRAMUSCULAR | Status: AC | PRN
  Administered 2019-05-05: 10 mg

## 2019-05-05 NOTE — Progress Notes (Signed)
   Procedure Note  Patient: Rhonda Brewer             Date of Birth: [birth year]           MRN: [id number]             Visit Date: 05/05/2019  Procedures: Visit Diagnoses:  1. Trigger finger, right ring finger     Hand/UE Inj: R ring A1 for trigger finger on 05/05/2019 4:03 PM Indications: pain, tendon swelling and therapeutic Details: 27 G needle, ultrasound-guided volar approach Medications: 0.5 mL lidocaine 1 %; 10 mg triamcinolone acetonide 40 MG/ML Aspirate: 0 mL Outcome: tolerated well, no immediate complications Procedure, treatment alternatives, risks and benefits explained, specific risks discussed. Immediately prior to procedure a time out was called to verify the correct patient, procedure, equipment, support staff and site/side marked as required. Patient was prepped and draped in the usual sterile fashion.    Postprocedure instructions were given.  The finger was splinted for the next 3 days. Bo Merino, MD

## 2019-05-06 ENCOUNTER — Ambulatory Visit: Payer: Medicare Other | Admitting: Rheumatology

## 2019-05-06 LAB — CBC WITH DIFFERENTIAL/PLATELET
Absolute Monocytes: 471 cells/uL (ref 200–950)
Basophils Absolute: 19 cells/uL (ref 0–200)
Basophils Relative: 0.3 %
Eosinophils Absolute: 174 cells/uL (ref 15–500)
Eosinophils Relative: 2.8 %
HCT: 44.3 % (ref 35.0–45.0)
Hemoglobin: 14.9 g/dL (ref 11.7–15.5)
Lymphs Abs: 2362 cells/uL (ref 850–3900)
MCH: 32.2 pg (ref 27.0–33.0)
MCHC: 33.6 g/dL (ref 32.0–36.0)
MCV: 95.7 fL (ref 80.0–100.0)
MPV: 10.4 fL (ref 7.5–12.5)
Monocytes Relative: 7.6 %
Neutro Abs: 3174 cells/uL (ref 1500–7800)
Neutrophils Relative %: 51.2 %
Platelets: 220 10*3/uL (ref 140–400)
RBC: 4.63 10*6/uL (ref 3.80–5.10)
RDW: 12.4 % (ref 11.0–15.0)
Total Lymphocyte: 38.1 %
WBC: 6.2 10*3/uL (ref 3.8–10.8)

## 2019-05-06 LAB — COMPLETE METABOLIC PANEL WITH GFR
AG Ratio: 1.9 (calc) (ref 1.0–2.5)
ALT: 16 U/L (ref 6–29)
AST: 24 U/L (ref 10–35)
Albumin: 4.4 g/dL (ref 3.6–5.1)
Alkaline phosphatase (APISO): 80 U/L (ref 37–153)
BUN: 13 mg/dL (ref 7–25)
CO2: 22 mmol/L (ref 20–32)
Calcium: 9.6 mg/dL (ref 8.6–10.4)
Chloride: 106 mmol/L (ref 98–110)
Creat: 0.81 mg/dL (ref 0.50–0.99)
GFR, Est African American: 86 mL/min/{1.73_m2} (ref >=60)
GFR, Est Non African American: 75 mL/min/{1.73_m2} (ref >=60)
Globulin: 2.3 g/dL (calc) (ref 1.9–3.7)
Glucose, Bld: 98 mg/dL (ref 65–99)
Potassium: 4.1 mmol/L (ref 3.5–5.3)
Sodium: 140 mmol/L (ref 135–146)
Total Bilirubin: 0.4 mg/dL (ref 0.2–1.2)
Total Protein: 6.7 g/dL (ref 6.1–8.1)

## 2019-05-07 ENCOUNTER — Ambulatory Visit: Payer: Medicare Other

## 2019-05-07 NOTE — Progress Notes (Signed)
CBC and CMP WNL

## 2019-05-10 ENCOUNTER — Ambulatory Visit: Payer: Medicare Other | Admitting: Rheumatology

## 2019-05-14 ENCOUNTER — Ambulatory Visit: Payer: Medicare Other | Attending: Internal Medicine

## 2019-05-14 DIAGNOSIS — Z23 Encounter for immunization: Secondary | ICD-10-CM | POA: Insufficient documentation

## 2019-05-14 NOTE — Progress Notes (Signed)
   Covid-19 Vaccination Clinic  Name:  Rhonda Brewer    MRN: [id number] DOB: 30-May-1950  05/14/2019  [name] was observed post Covid-19 immunization for 15 minutes without incidence. She was provided with Vaccine Information Sheet and instruction to access the V-Safe system.   Ms. Rolls was instructed to call 911 with any severe reactions post vaccine: Marland Kitchen Difficulty breathing  . Swelling of your face and throat  . A fast heartbeat  . A bad rash all over your body  . Dizziness and weakness    Immunizations Administered    Name Date Dose VIS Date Route   Moderna COVID-19 Vaccine 05/14/2019 11:42 AM 0.5 mL 02/16/2019 Intramuscular   Manufacturer: Moderna   Lot: [id number]   [id number]

## 2019-05-17 DIAGNOSIS — M545 Low back pain: Secondary | ICD-10-CM | POA: Diagnosis not present

## 2019-06-03 DIAGNOSIS — Z79899 Other long term (current) drug therapy: Secondary | ICD-10-CM | POA: Diagnosis not present

## 2019-06-03 DIAGNOSIS — H04123 Dry eye syndrome of bilateral lacrimal glands: Secondary | ICD-10-CM | POA: Diagnosis not present

## 2019-06-03 DIAGNOSIS — M069 Rheumatoid arthritis, unspecified: Secondary | ICD-10-CM | POA: Diagnosis not present

## 2019-06-03 DIAGNOSIS — H25813 Combined forms of age-related cataract, bilateral: Secondary | ICD-10-CM | POA: Diagnosis not present

## 2019-06-04 DIAGNOSIS — M545 Low back pain: Secondary | ICD-10-CM | POA: Diagnosis not present

## 2019-06-28 DIAGNOSIS — M545 Low back pain: Secondary | ICD-10-CM | POA: Diagnosis not present

## 2019-07-17 ENCOUNTER — Other Ambulatory Visit: Payer: Self-pay | Admitting: Family Medicine

## 2019-07-17 DIAGNOSIS — R1012 Left upper quadrant pain: Secondary | ICD-10-CM

## 2019-07-29 NOTE — Progress Notes (Signed)
Office Visit Note  Patient: Rhonda Brewer             Date of Birth: 1950-03-23           MRN: [id number]             PCP: [name], MD Referring: [name], MD Visit Date: 08/10/2019 Occupation: @GUAROCC @  Subjective:  Right hand stiffness   History of Present Illness: Rhonda Brewer is a 69 y.o. female with history of scleroderma.  She has noticed some increased skin tightness and joint stiffness in the right hand.  She has noticed increased symptoms of Raynaud's.  She denies any digital ulcerations or signs of gangrene.  She denies any recent rashes.  She denies any shortness of breath, palpitations, or chest pain.  She denies any dysphagia or constipation.  She received both covid-19 vaccinations.      Activities of Daily Living:  Patient reports morning stiffness for 5-10  minutes.   Patient Reports nocturnal pain.  Difficulty dressing/grooming: Denies Difficulty climbing stairs: Denies Difficulty getting out of chair: Denies Difficulty using hands for taps, buttons, cutlery, and/or writing: Denies  Review of Systems  Constitutional: Negative for fatigue.  HENT: Negative for mouth sores, mouth dryness and nose dryness.   Eyes: Negative for pain, visual disturbance and dryness.  Respiratory: Negative for cough, hemoptysis, shortness of breath and difficulty breathing.   Cardiovascular: Negative for chest pain, palpitations, hypertension and swelling in legs/feet.  Gastrointestinal: Negative for blood in stool, constipation and diarrhea.  Endocrine: Negative for increased urination.  Genitourinary: Negative for painful urination.  Musculoskeletal: Positive for arthralgias, joint pain and morning stiffness. Negative for joint swelling, myalgias, muscle weakness, muscle tenderness and myalgias.  Skin: Positive for color change (Raynaud's ). Negative for pallor, rash, hair loss, nodules/bumps, skin tightness, ulcers and sensitivity to sunlight.    Allergic/Immunologic: Negative for susceptible to infections.  Neurological: Negative for dizziness, numbness, headaches and weakness.  Hematological: Negative for swollen glands.  Psychiatric/Behavioral: Negative for depressed mood and sleep disturbance. The patient is not nervous/anxious.     PMFS History:  Patient Active Problem List   Diagnosis Date Noted  . Constipation 11/12/2018  . Carpal tunnel syndrome 05/29/2017  . Low back pain without sciatica 04/29/2017  . Screening for breast cancer 09/04/2016  . Dyspnea and respiratory abnormalities 08/16/2016  . LUQ abdominal pain 06/26/2016  . H/O cardiac radiofrequency ablation 06/26/2016  . CAD (coronary artery disease), native coronary artery 06/19/2016  . History of hypothyroidism 06/19/2016  . History of uterine cancer 06/19/2016  . Scleroderma ([place]) 06/16/2016  . High risk medication use 06/16/2016  . Osteopenia of multiple sites 06/16/2016    Past Medical History:  Diagnosis Date  . Allergy   . Arthritis   . CAD (coronary artery disease)    a. DES to RCA 11/2015  . Cancer [facility])    Uterine 2008  . Heart murmur   . Hyperlipidemia   . Osteoporosis   . Thyroid disease     Family History  Problem Relation Age of Onset  . Asthma Maternal Aunt   . Heart disease Mother   . Diabetes Mother   . Heart disease Father   . Stomach cancer Brother   . Diabetes Brother   . Colon cancer Neg Hx   . Esophageal cancer Neg Hx   . Rectal cancer Neg Hx    Past Surgical History:  Procedure Laterality Date  . ABDOMINAL HYSTERECTOMY    . APPENDECTOMY    .  CARDIAC ELECTROPHYSIOLOGY MAPPING AND ABLATION  04/19/2003  . CARPAL TUNNEL RELEASE Right 10/15/2017  . CORONARY ANGIOPLASTY WITH STENT PLACEMENT    . RIGHT/LEFT HEART CATH AND CORONARY ANGIOGRAPHY [name] 08/14/2017   Procedure: RIGHT/LEFT HEART CATH AND CORONARY ANGIOGRAPHY;  Surgeon: Jolaine Artist, MD;  Location: [place] [facility];  Service: Cardiovascular;  Laterality:  [name];  . ULTRASOUND GUIDANCE FOR VASCULAR ACCESS  08/14/2017   Procedure: Ultrasound Guidance For Vascular Access;  Surgeon: Jolaine Artist, MD;  Location: [place] [facility];  Service: Cardiovascular;;   Social History   Social History Narrative   Lives in home with husband. Daughters live out of state. Step-daughter in [place]. Has 8 grandchildren. Lives in 2 level home but most living is on first floor. Stairs have handrails, no problem with stairs. No grab bars in bathroom. No tripping hazards. Smoke alarms present.   No pets.    Eats a good variety of foods, husband is a cook. Eats meats, fruits, vegetables. Drinks water mostly. Occasional tea.   Goes to gym 5 times a week. Enjoys reading, sewing, movies. Walking when weather is nice.    Wears seat belt in car.    Immunization History  Administered Date(s) Administered  . Influenza Split 01/17/2016  . Influenza,inj,Quad PF,6+ Mos 11/12/2018  . Influenza-Unspecified 04/02/2017, 12/08/2017  . Moderna SARS-COVID-2 Vaccination 04/09/2019, 05/14/2019  . Pneumococcal Conjugate-13 10/26/2014  . Pneumococcal-Unspecified 09/14/2013  . Td 07/18/2011     Objective: Vital Signs: BP 122/71 (BP Location: Left Arm, Patient Position: Sitting, Cuff Size: Small)   Pulse (!) 57   Resp 12   Ht 5\' 4"  (1.626 m)   Wt 141 lb 6.4 oz (64.1 kg)   BMI 24.27 kg/m    Physical Exam Vitals and nursing note reviewed.  Constitutional:      Appearance: She is well-developed.  HENT:     Head: Normocephalic and atraumatic.  Eyes:     Conjunctiva/sclera: Conjunctivae normal.  Pulmonary:     Effort: Pulmonary effort is normal.  Abdominal:     General: Bowel sounds are normal.     Palpations: Abdomen is soft.  Musculoskeletal:     Cervical back: Normal range of motion.  Lymphadenopathy:     Cervical: No cervical adenopathy.  Skin:    General: Skin is warm and dry.     Capillary Refill: Capillary refill takes 2 to 3 seconds. Capillary dropout noted  under dermatoscope     Comments: No digital ulcerations or signs or gangrene.  Telangectasia's noted on both the palmar aspect of both hands.  Poikiloderma noted on the right hand and bilateral lower extremities.  Skin thickening distal to MCPs.  Finger and toes warm on exam.  No signs of cyanosis.   Neurological:     Mental Status: She is alert and oriented to person, place, and time.  Psychiatric:        Behavior: Behavior normal.      Musculoskeletal Exam: C-spine, thoracic spine, and lumbar spine good ROM.  Shoulder joints, elbow joints, wrist joints, MCPs, PIPs, and DIPs good ROM with no synovitis.  Complete fist formation bilaterally.  Hip joints, knee joints, ankle joints, MTPs, PIPs, and DIPs good ROM with no synovitis.  No warmth or effusion of knee joints.  No tenderness or swelling of ankle joints.    CDAI Exam: CDAI Score: -- Patient Global: --; Provider Global: -- Swollen: --; Tender: -- Joint Exam 08/10/2019   No joint exam has been documented for this  visit   There is currently no information documented on the homunculus. Go to the Rheumatology activity and complete the homunculus joint exam.  Investigation: No additional findings.  Imaging: No results found.  Recent Labs: Lab Results  Component Value Date   WBC 6.2 05/05/2019   HGB 14.9 05/05/2019   PLT 220 05/05/2019   NA 140 05/05/2019   K 4.1 05/05/2019   CL 106 05/05/2019   CO2 22 05/05/2019   GLUCOSE 98 05/05/2019   BUN 13 05/05/2019   CREATININE 0.81 05/05/2019   BILITOT 0.4 05/05/2019   ALKPHOS 64 06/19/2016   AST 24 05/05/2019   ALT 16 05/05/2019   PROT 6.7 05/05/2019   ALBUMIN 4.4 06/19/2016   CALCIUM 9.6 05/05/2019   GFRAA 86 05/05/2019    Speciality Comments: PLQ Eye Exam: 08/07/17 PLQ toxcity,  PLQ discotinued @ [facility]  Procedures:  No procedures performed Allergies: Avelox [moxifloxacin hcl in nacl] and Other   Assessment / Plan:     Visit Diagnoses: Scleroderma ([place]) -  Limited systemic with Raynauds, Telengectesia's, sclerodactyly, arthralgias, erosions in right fifth and left third DIP, ANA centromere: No signs or symptoms of disease progression.  She has been experiencing more frequent symptoms of raynaud's, but no digital ulcerations or signs or gangrene were noted.  Telangectasia's noted on both the palmar aspect of both hands.  Poikiloderma noted on the right hand and bilateral lower extremities.  Skin thickening distal to MCPs noted.  No progression was noted.  She has not had any symptoms of dysphagia or constipation recently.  No shortness of breath or chest pain.  No signs of inflammatory arthritis noted on examination today.  She was previously taking Plaquenil but discontinued due to ocular toxicity.  She will not require immunosuppressive therapy at this time.  She was advised to notify us if she left any new or worsening symptoms.  To follow-up in the office in 6 months.  High risk medication use - Dr. Katy Fitch advised patient to stop taking PLQ.  She has been off of Plaquenil since May 2019.   Raynaud's phenomenon without gangrene: She has been experiencing more frequent symptoms of Raynaud's recently.  No digital ulcerations or signs of gangrene were noted.  Her fingers and toes were warm on exam.  Capillary refill was 2 to 3 seconds.  Capillary dropout noted when using the dermatoscope.  We discussed the importance of avoiding triggers.  She will continue taking aspirin 81 mg 1 tablet by mouth daily.  Thrombocytopenia ([place]): Platelet count 220 on 05/05/19.   Osteopenia of multiple sites: DEXA on 09/06/16: BMD measured at left FN is 0.820 with T-score -1.6.  She is overdue to update DEXA. She is taking a calcium and vitamin D supplement.   Trigger finger, right ring finger: She had a cortisone injection on 05/05/19.  Resolved.   Trigger finger, right middle finger: She experiences occasional locking and nocturnal pain.    Trigger middle finger of left hand:  Resolved   Pes anserinus bursitis of left knee: Resolved   Other medical conditions are listed as follows:   History of uterine cancer  History of coronary artery disease  H/O cardiac radiofrequency ablation  History of hypothyroidism  Orders: No orders of the defined types were placed in this encounter.  No orders of the defined types were placed in this encounter.     Follow-Up Instructions: Return in about 5 months (around 01/10/2020) for Scleroderma.   Ofilia Neas, PA-C  Note -  This record has been created using [employer].  Chart creation errors have been sought, but may not always  have been located. Such creation errors do not reflect on  the standard of medical care.

## 2019-08-10 ENCOUNTER — Ambulatory Visit: Payer: Medicare Other | Admitting: Physician Assistant

## 2019-08-10 ENCOUNTER — Other Ambulatory Visit: Payer: Self-pay

## 2019-08-10 ENCOUNTER — Encounter: Payer: Self-pay | Admitting: Physician Assistant

## 2019-08-10 VITALS — BP 122/71 | HR 57 | Resp 12 | Ht 64.0 in | Wt 141.4 lb

## 2019-08-10 DIAGNOSIS — I7300 Raynaud's syndrome without gangrene: Secondary | ICD-10-CM | POA: Diagnosis not present

## 2019-08-10 DIAGNOSIS — M8589 Other specified disorders of bone density and structure, multiple sites: Secondary | ICD-10-CM | POA: Diagnosis not present

## 2019-08-10 DIAGNOSIS — M65332 Trigger finger, left middle finger: Secondary | ICD-10-CM

## 2019-08-10 DIAGNOSIS — M65341 Trigger finger, right ring finger: Secondary | ICD-10-CM

## 2019-08-10 DIAGNOSIS — M7052 Other bursitis of knee, left knee: Secondary | ICD-10-CM

## 2019-08-10 DIAGNOSIS — M65331 Trigger finger, right middle finger: Secondary | ICD-10-CM

## 2019-08-10 DIAGNOSIS — D696 Thrombocytopenia, unspecified: Secondary | ICD-10-CM | POA: Diagnosis not present

## 2019-08-10 DIAGNOSIS — Z79899 Other long term (current) drug therapy: Secondary | ICD-10-CM

## 2019-08-10 DIAGNOSIS — Z9889 Other specified postprocedural states: Secondary | ICD-10-CM

## 2019-08-10 DIAGNOSIS — Z8542 Personal history of malignant neoplasm of other parts of uterus: Secondary | ICD-10-CM

## 2019-08-10 DIAGNOSIS — Z8639 Personal history of other endocrine, nutritional and metabolic disease: Secondary | ICD-10-CM

## 2019-08-10 DIAGNOSIS — M349 Systemic sclerosis, unspecified: Secondary | ICD-10-CM

## 2019-08-10 DIAGNOSIS — Z8679 Personal history of other diseases of the circulatory system: Secondary | ICD-10-CM

## 2019-09-09 ENCOUNTER — Telehealth (HOSPITAL_COMMUNITY): Payer: Self-pay | Admitting: *Deleted

## 2019-09-09 NOTE — Telephone Encounter (Signed)
Very common for people with scleroderma to get reflux and this sounds like reflux but if pain recurs or happens with exertion we need to know about it ASAP (or go to ER).

## 2019-09-09 NOTE — Telephone Encounter (Signed)
Pt called stating she woke up with a little chest discomfort that felt like acid reflux. Pt took a few tums and chest discomfort was relieved. Pt now feels as if she can't take a full breath pt said it may be anxiety. Pt did not feel this was an emergency situation and did not wish to go to the emergency room for evaluation. Pt would like feed back from [place]. pt advised if symptoms persist or worsen she should go to the emergency room but I would follow up with Dr.Bensimhon. Rhonda Brewer aware and advised I route message to [place] for advice.

## 2019-09-14 ENCOUNTER — Encounter (HOSPITAL_COMMUNITY): Payer: Self-pay | Admitting: *Deleted

## 2019-09-14 NOTE — Telephone Encounter (Signed)
Advice Sent via mychart.

## 2019-10-01 ENCOUNTER — Other Ambulatory Visit (HOSPITAL_COMMUNITY): Payer: Self-pay | Admitting: Internal Medicine

## 2019-10-01 ENCOUNTER — Other Ambulatory Visit: Payer: Self-pay | Admitting: Family Medicine

## 2019-10-12 ENCOUNTER — Encounter: Payer: Self-pay | Admitting: Physician Assistant

## 2019-11-15 ENCOUNTER — Encounter: Payer: Medicare Other | Admitting: Family Medicine

## 2019-11-16 ENCOUNTER — Encounter: Payer: Self-pay | Admitting: Physician Assistant

## 2019-11-16 ENCOUNTER — Ambulatory Visit: Payer: Medicare Other | Admitting: Physician Assistant

## 2019-11-16 VITALS — BP 120/72 | HR 72 | Ht 62.0 in | Wt 143.0 lb

## 2019-11-16 DIAGNOSIS — K219 Gastro-esophageal reflux disease without esophagitis: Secondary | ICD-10-CM

## 2019-11-16 DIAGNOSIS — R1012 Left upper quadrant pain: Secondary | ICD-10-CM

## 2019-11-16 DIAGNOSIS — R1314 Dysphagia, pharyngoesophageal phase: Secondary | ICD-10-CM

## 2019-11-16 NOTE — Patient Instructions (Signed)
If you are age 69 or older, your body mass index should be between 23-30. Your Body mass index is 26.16 kg/m. If this is out of the aforementioned range listed, please consider follow up with your Primary Care Provider.  If you are age 71 or younger, your body mass index should be between 19-25. Your Body mass index is 26.16 kg/m. If this is out of the aformentioned range listed, please consider follow up with your Primary Care Provider.   Increase Pantoprazole 40 mg twice daily 20-30 minutes before breakfast and dinner.  You have been scheduled for a Barium Esophogram at [facility] (1st floor of the hospital) on Tuesday 11/23/19 at 11 am. Please arrive 15 minutes prior to your appointment for registration. Make certain not to have anything to eat or drink 3 hours prior to your test. If you need to reschedule for any reason, please contact radiology at [phone] to do so. __________________________________________________________________ A barium swallow is an examination that concentrates on views of the esophagus. This tends to be a double contrast exam (barium and two liquids which, when combined, create a gas to distend the wall of the oesophagus) or single contrast (non-ionic iodine based). The study is usually tailored to your symptoms so a good history is essential. Attention is paid during the study to the form, structure and configuration of the esophagus, looking for functional disorders (such as aspiration, dysphagia, achalasia, motility and reflux) EXAMINATION You may be asked to change into a gown, depending on the type of swallow being performed. A radiologist and radiographer will perform the procedure. The radiologist will advise you of the type of contrast selected for your procedure and direct you during the exam. You will be asked to stand, sit or lie in several different positions and to hold a small amount of fluid in your mouth before being asked to swallow while the  imaging is performed .In some instances you may be asked to swallow barium coated marshmallows to assess the motility of a solid food bolus. The exam can be recorded as a digital or video fluoroscopy procedure. POST PROCEDURE It will take 1-2 days for the barium to pass through your system. To facilitate this, it is important, unless otherwise directed, to increase your fluids for the next 24-48hrs and to resume your normal diet.  This test typically takes about 30 minutes to perform. ______________________________________________________________

## 2019-11-16 NOTE — Progress Notes (Signed)
Chief Complaint: GERD  HPI:    Rhonda Brewer is a 69 year old female with past medical history as listed below including scleroderma, CAD status post stent in 2017, known to Dr. Silverio Decamp, who was referred to me by [name], MD for a complaint of GERD.      01/03/2017 EGD for epigastric abdominal pain and pain in the left upper quadrant with esophageal mucosal changes suspicious for short segment Barrett's, normal stomach, normal duodenum.  Pathology showed mild inflammation consistent with reflux.    09/09/2019 patient called cardiologist describing some chest discomfort when she woke up, she took a few times and the chest comfort was relieved.  Dr. Haroldine Laws advised her that it was common for patients with scleroderma to get reflux and this is likely what it was.    Today, the patient presents to clinic and tells me that about 2 months ago she woke up with a chest pain and burning, she called her cardiologist because she was nervous about her heart and they told her likely it was reflux.  She did take a few Tums and things seemed to be better.  Since then though she has had ongoing breakthrough symptoms which seem to be worse at night when she lays down.  She has adjusted her diet decreasing alcohol intake and coffee as well as eating earlier in the evening, at least 2 to 3 hours before bed but even though she has been doing this she continues to have breakthrough symptoms.  Also feels sometimes like food just stays in her throat.  Eventually will go down after drinking sips of water.    Denies fever, chills, abdominal pain, weight loss or change in bowel habits.     Past Medical History:  Diagnosis Date  . Allergy   . Arthritis   . CAD (coronary artery disease)    a. DES to RCA 11/2015  . Cancer [facility])    Uterine 2008  . Heart murmur   . Hyperlipidemia   . Osteoporosis   . Thyroid disease     Past Surgical History:  Procedure Laterality Date  . ABDOMINAL HYSTERECTOMY    .  APPENDECTOMY    . CARDIAC ELECTROPHYSIOLOGY MAPPING AND ABLATION  04/19/2003  . CARPAL TUNNEL RELEASE Right 10/15/2017  . CORONARY ANGIOPLASTY WITH STENT PLACEMENT    . RIGHT/LEFT HEART CATH AND CORONARY ANGIOGRAPHY [name] 08/14/2017   Procedure: RIGHT/LEFT HEART CATH AND CORONARY ANGIOGRAPHY;  Surgeon: Jolaine Artist, MD;  Location: [place] [facility];  Service: Cardiovascular;  Laterality: [name];  . ULTRASOUND GUIDANCE FOR VASCULAR ACCESS  08/14/2017   Procedure: Ultrasound Guidance For Vascular Access;  Surgeon: Jolaine Artist, MD;  Location: [place] [facility];  Service: Cardiovascular;;    Current Outpatient Medications  Medication Sig Dispense Refill  . levothyroxine (SYNTHROID) 75 MCG tablet TAKE 1 TABLET BY MOUTH  DAILY BEFORE BREAKFAST 90 tablet 3  . albuterol (PROVENTIL HFA;VENTOLIN HFA) 108 (90 Base) MCG/ACT inhaler INHALE 2 PUFFS INTO THE LUNGS EVERY 4 HOURS AS NEEDED FOR WHEEZING OR SHORTNESS OF BREATH 90 g 6  . aspirin EC 81 MG tablet Take 81 mg by mouth daily.     Marland Kitchen atorvastatin (LIPITOR) 40 MG tablet TAKE 1 TABLET BY MOUTH  DAILY 90 tablet 3  . Calcium Carbonate-Vitamin D (CALCIUM-VITAMIN D3 PO) Take 1 tablet by mouth daily.    . cycloSPORINE (RESTASIS) 0.05 % ophthalmic emulsion Place 1 drop into both eyes 2 (two) times daily.    Marland Kitchen  Multiple Vitamin (MULTI-VITAMINS) TABS Take 1 tablet by mouth daily.     . pantoprazole (PROTONIX) 40 MG tablet TAKE 1 TABLET BY MOUTH  DAILY 90 tablet 3  . vitamin B-12 (CYANOCOBALAMIN) 100 MCG tablet Take 100 mcg by mouth daily.     No current facility-administered medications for this visit.    Allergies as of 11/16/2019 - Review Complete 08/10/2019  Allergen Reaction Noted  . Avelox [moxifloxacin hcl in nacl] Other (See Comments) 06/19/2016  . Other Other (See Comments) 07/12/2015    Family History  Problem Relation Age of Onset  . Asthma Maternal Aunt   . Heart disease Mother   . Diabetes Mother   . Heart disease Father   .  Stomach cancer Brother   . Diabetes Brother   . Colon cancer Neg Hx   . Esophageal cancer Neg Hx   . Rectal cancer Neg Hx     Social History   Socioeconomic History  . Marital status: Married    Spouse name: Cay Schillings  . Number of children: 2  . Years of education: 38  . Highest education level: 12th grade  Occupational History  . Occupation: retired    Comment: Scientist, water quality  Tobacco Use  . Smoking status: Never Smoker  . Smokeless tobacco: Never Used  Vaping Use  . Vaping Use: Never used  Substance and Sexual Activity  . Alcohol use: Yes    Comment: occasional wine  . Drug use: No  . Sexual activity: Yes    Birth control/protection: Surgical    Comment: hysterectomy  Other Topics Concern  . Not on file  Social History Narrative   Lives in home with husband. Daughters live out of state. Step-daughter in [place]. Has 8 grandchildren. Lives in 2 level home but most living is on first floor. Stairs have handrails, no problem with stairs. No grab bars in bathroom. No tripping hazards. Smoke alarms present.   No pets.    Eats a good variety of foods, husband is a cook. Eats meats, fruits, vegetables. Drinks water mostly. Occasional tea.   Goes to gym 5 times a week. Enjoys reading, sewing, movies. Walking when weather is nice.    Wears seat belt in car.    Social Determinants of Health   Financial Resource Strain:   . Difficulty of Paying Living Expenses: Not on file  Food Insecurity:   . Worried About Charity fundraiser in the Last Year: Not on file  . Ran Out of Food in the Last Year: Not on file  Transportation Needs:   . Lack of Transportation (Medical): Not on file  . Lack of Transportation (Non-Medical): Not on file  Physical Activity:   . Days of Exercise per Week: Not on file  . Minutes of Exercise per Session: Not on file  Stress:   . Feeling of Stress : Not on file  Social Connections:   . Frequency of Communication with Friends and Family: Not on file    . Frequency of Social Gatherings with Friends and Family: Not on file  . Attends Religious Services: Not on file  . Active Member of Clubs or Organizations: Not on file  . Attends Archivist Meetings: Not on file  . Marital Status: Not on file  Intimate Partner Violence:   . Fear of Current or Ex-Partner: Not on file  . Emotionally Abused: Not on file  . Physically Abused: Not on file  . Sexually Abused: Not on file  Review of Systems:    Constitutional: No weight loss, fever or chills Cardiovascular: No chest pain   Respiratory: No SOB Gastrointestinal: See HPI and otherwise negative   Physical Exam:  Vital signs: BP 120/72   Pulse 72   Ht 5\' 2"  (1.575 m)   Wt 143 lb (64.9 kg)   BMI 26.16 kg/m   Constitutional:   Pleasant female appears to be in NAD, Well developed, Well nourished, alert and cooperative Respiratory: Respirations even and unlabored. Lungs clear to auscultation bilaterally.   No wheezes, crackles, or rhonchi.  Cardiovascular: Normal S1, S2. No MRG. Regular rate and rhythm. No peripheral edema, cyanosis or pallor.  Gastrointestinal:  Soft, nondistended, nontender. No rebound or guarding. Normal bowel sounds. No appreciable masses or hepatomegaly. Rectal:  Not performed.  Psychiatric: Demonstrates good judgement and reason without abnormal affect or behaviors.  RELEVANT LABS AND IMAGING: CBC    Component Value Date/Time   WBC 6.2 05/05/2019 1543   RBC 4.63 05/05/2019 1543   HGB 14.9 05/05/2019 1543   HCT 44.3 05/05/2019 1543   PLT 220 05/05/2019 1543   MCV 95.7 05/05/2019 1543   MCH 32.2 05/05/2019 1543   MCHC 33.6 05/05/2019 1543   RDW 12.4 05/05/2019 1543   LYMPHSABS 2,362 05/05/2019 1543   MONOABS 500 06/19/2016 1038   EOSABS 174 05/05/2019 1543   BASOSABS 19 05/05/2019 1543    CMP     Component Value Date/Time   NA 140 05/05/2019 1543   K 4.1 05/05/2019 1543   CL 106 05/05/2019 1543   CO2 22 05/05/2019 1543   GLUCOSE 98  05/05/2019 1543   BUN 13 05/05/2019 1543   CREATININE 0.81 05/05/2019 1543   CALCIUM 9.6 05/05/2019 1543   PROT 6.7 05/05/2019 1543   ALBUMIN 4.4 06/19/2016 1038   AST 24 05/05/2019 1543   ALT 16 05/05/2019 1543   ALKPHOS 64 06/19/2016 1038   BILITOT 0.4 05/05/2019 1543   GFRNONAA 75 05/05/2019 1543   GFRAA 86 05/05/2019 1543    Assessment: 1.  GERD: Increased over the past 2 months, no obvious triggers, history of GERD on EGD in 2018 2.  Dysphagia: Patient complains of food sometimes feeling like it just gets stuck in her throat; consider esophageal stricture versus esophagitis versus dysmotility versus other  Plan: 1.  Ordered barium swallow with tablet for further evaluation of dysphagia. 2.  Increase Pantoprazole to 40 mg twice daily, 30-60 minutes for breakfast and again before dinner.  Patient tells me she has enough of this medication and will call us if she runs out. 3.  Reviewed antireflux diet and lifestyle modifications. 4.  Patient to follow in clinic with me in 6 to 8 weeks.  Advised her to call and let us know if she is not feeling better in the next couple of weeks.  Ellouise Newer, PA-C [facility] 11/16/2019, 8:57 AM  Cc: [name], MD

## 2019-11-21 ENCOUNTER — Telehealth: Payer: Medicare Other | Admitting: Family

## 2019-11-21 DIAGNOSIS — Z20822 Contact with and (suspected) exposure to covid-19: Secondary | ICD-10-CM

## 2019-11-21 MED ORDER — BENZONATATE 100 MG PO CAPS: 100.0000 mg | ORAL_CAPSULE | Freq: Three times a day (TID) | ORAL | 0 refills | Status: DC | PRN

## 2019-11-21 NOTE — Progress Notes (Signed)
E-Visit for Corona Virus Screening  Your current symptoms could be consistent with the coronavirus.  Many health care providers can now test patients at their office but not all are.  [facility] has multiple testing sites. For information on our [place] testing locations and hours go to [email or web address]  We are enrolling you in our [place] for [place] . Daily you will receive a questionnaire within the [place] website. Our COVID 19 response team will be monitoring your responses daily.  Testing Information: The COVID-19 Community Testing sites will begin testing BY APPOINTMENT ONLY.  You can schedule online at [email or web address]  If you do not have access to a smart phone or computer you may call [phone] for an appointment.   Additional testing sites in the Community:  . For [facility] Testing sites in [facility]  FaceUpdate.[email or web address]  . For Pop-up testing sites in [place]  BowlDirectory.co.uk  . For Testing sites with regular hours [email or web address]  . For [place] [email or web address]  . For [facility] [email or web address]  . For [facility] testing in [place] and [employer] [email or web address]  . For [facility] testing in [facility]   [email or web address]  For  more information about community testing call [phone]   Please quarantine yourself while awaiting your test results. Please stay home for a minimum of 10 days from the first day of illness with improving symptoms and you have had 24 hours of no fever (without the use of Tylenol (Acetaminophen)  Motrin (Ibuprofen) or any fever reducing medication).  Also - Do not get tested prior to returning to work because once you have had a positive test the test can stay positive for more than a month in some cases.   You should wear a mask or cloth face covering over your nose and mouth if you must be around other people or animals, including pets (even at home). Try to stay at least 6 feet away from other people. This will protect the people around you.  Please continue good preventive care measures, including:  frequent hand-washing, avoid touching your face, cover coughs/sneezes, stay out of crowds and keep a 6 foot distance from others.  COVID-19 is a respiratory illness with symptoms that are similar to the flu. Symptoms are typically mild to moderate, but there have been cases of severe illness and death due to the virus.   The following symptoms may appear 2-14 days after exposure: . Fever . Cough . Shortness of breath or difficulty breathing . Chills . Repeated shaking with chills . Muscle pain . Headache . Sore throat . New loss of taste or smell . Fatigue . Congestion or runny nose . Nausea or vomiting . Diarrhea  Go to the nearest hospital ED for assessment if fever/cough/breathlessness are severe or illness seems like a threat to life.  It is vitally important that if you feel that you have an infection such as this virus or any other virus that you stay home and away from places where you may spread it to others.  You should avoid contact with people age 37 and older.   You can use medication such as A prescription cough medication called Tessalon Perles 100 mg. You may take 1-2 capsules every 8 hours as needed for cough  You may also take acetaminophen (Tylenol) as needed for fever.  Reduce your risk of any infection by using the same precautions used for avoiding the common cold or flu:  Marland Kitchen Wash your hands  often with soap and warm water for at least 20 seconds.  If soap and  water are not readily available, use an alcohol-based hand sanitizer with at least 60% alcohol.  . If coughing or sneezing, cover your mouth and nose by coughing or sneezing into the elbow areas of your shirt or coat, into a tissue or into your sleeve (not your hands). . Avoid shaking hands with others and consider head nods or verbal greetings only. . Avoid touching your eyes, nose, or mouth with unwashed hands.  . Avoid close contact with people who are sick. . Avoid places or events with large numbers of people in one location, like concerts or sporting events. . Carefully consider travel plans you have or are making. . If you are planning any travel outside or inside the [place], visit the CDC's Travelers' Health webpage for the latest health notices. . If you have some symptoms but not all symptoms, continue to monitor at home and seek medical attention if your symptoms worsen. . If you are having a medical emergency, call 911.  HOME CARE . Only take medications as instructed by your medical team. . Drink plenty of fluids and get plenty of rest. . A steam or ultrasonic humidifier can help if you have congestion.   GET HELP RIGHT AWAY IF YOU HAVE EMERGENCY WARNING SIGNS** FOR COVID-19. If you or someone is showing any of these signs seek emergency medical care immediately. Call 911 or proceed to your closest emergency facility if: . You develop worsening high fever. . Trouble breathing . Bluish lips or face . Persistent pain or pressure in the chest . New confusion . Inability to wake or stay awake . You cough up blood. . Your symptoms become more severe  **This list is not all possible symptoms. Contact your medical provider for any symptoms that are sever or concerning to you.  MAKE SURE YOU   Understand these instructions.  Will watch your condition.  Will get help right away if you are not doing well or get worse.  Your e-visit answers were reviewed by a board certified  advanced clinical practitioner to complete your personal care plan.  Depending on the condition, your plan could have included both over the counter or prescription medications.  If there is a problem please reply once you have received a response from your provider.  Your safety is important to [place].  If you have drug allergies check your prescription carefully.    You can use MyChart to ask questions about today's visit, request a non-urgent call back, or ask for a work or school excuse for 24 hours related to this e-Visit. If it has been greater than 24 hours you will need to follow up with your provider, or enter a new e-Visit to address those concerns. You will get an e-mail in the next two days asking about your experience.  I hope that your e-visit has been valuable and will speed your recovery. Thank you for using e-visits.  Approximately 5 minutes was spent documenting and reviewing patient's chart.

## 2019-11-22 ENCOUNTER — Encounter (INDEPENDENT_AMBULATORY_CARE_PROVIDER_SITE_OTHER): Payer: Self-pay

## 2019-11-23 ENCOUNTER — Ambulatory Visit (HOSPITAL_COMMUNITY): Payer: Medicare Other

## 2019-11-23 ENCOUNTER — Other Ambulatory Visit: Payer: Self-pay | Admitting: Radiology

## 2019-11-23 ENCOUNTER — Other Ambulatory Visit: Payer: Medicare Other

## 2019-11-23 ENCOUNTER — Encounter (INDEPENDENT_AMBULATORY_CARE_PROVIDER_SITE_OTHER): Payer: Self-pay

## 2019-11-23 DIAGNOSIS — Z20822 Contact with and (suspected) exposure to covid-19: Secondary | ICD-10-CM

## 2019-11-24 LAB — SARS-COV-2, NAA 2 DAY TAT

## 2019-11-24 LAB — NOVEL CORONAVIRUS, NAA: SARS-CoV-2, NAA: NOT DETECTED

## 2019-11-29 ENCOUNTER — Other Ambulatory Visit: Payer: Self-pay

## 2019-11-29 ENCOUNTER — Ambulatory Visit (INDEPENDENT_AMBULATORY_CARE_PROVIDER_SITE_OTHER): Payer: Medicare Other | Admitting: Family Medicine

## 2019-11-29 ENCOUNTER — Encounter: Payer: Self-pay | Admitting: Family Medicine

## 2019-11-29 ENCOUNTER — Encounter (INDEPENDENT_AMBULATORY_CARE_PROVIDER_SITE_OTHER): Payer: Self-pay

## 2019-11-29 VITALS — BP 132/64 | HR 61 | Ht 62.0 in | Wt 141.4 lb

## 2019-11-29 DIAGNOSIS — Z0000 Encounter for general adult medical examination without abnormal findings: Secondary | ICD-10-CM | POA: Diagnosis not present

## 2019-11-29 DIAGNOSIS — Z23 Encounter for immunization: Secondary | ICD-10-CM

## 2019-11-29 DIAGNOSIS — I2510 Atherosclerotic heart disease of native coronary artery without angina pectoris: Secondary | ICD-10-CM | POA: Diagnosis not present

## 2019-11-29 DIAGNOSIS — M545 Low back pain: Secondary | ICD-10-CM

## 2019-11-29 DIAGNOSIS — M8589 Other specified disorders of bone density and structure, multiple sites: Secondary | ICD-10-CM

## 2019-11-29 DIAGNOSIS — Z8639 Personal history of other endocrine, nutritional and metabolic disease: Secondary | ICD-10-CM

## 2019-11-29 DIAGNOSIS — G8929 Other chronic pain: Secondary | ICD-10-CM

## 2019-11-29 NOTE — Patient Instructions (Signed)
I will call with lab test results. We will likely talk about your cholesterol.  The goal is to have your bad LDL cholesterol less than 70. No rush to get your bone density done.  Wait until after this COVID surge. We will be giving COVID booster shots in our office, likely after Sept 20.  Call for a nurse visit.

## 2019-11-30 ENCOUNTER — Ambulatory Visit (HOSPITAL_COMMUNITY): Payer: Medicare Other

## 2019-11-30 LAB — LIPID PANEL
Chol/HDL Ratio: 2.4 ratio (ref 0.0–4.4)
Cholesterol, Total: 188 mg/dL (ref 100–199)
HDL: 78 mg/dL (ref >39)
LDL Chol Calc (NIH): 92 mg/dL (ref 0–99)
Triglycerides: 105 mg/dL (ref 0–149)
VLDL Cholesterol Cal: 18 mg/dL (ref 5–40)

## 2019-11-30 LAB — TSH: TSH: 3.02 u[IU]/mL (ref 0.450–4.500)

## 2019-12-01 DIAGNOSIS — L814 Other melanin hyperpigmentation: Secondary | ICD-10-CM | POA: Diagnosis not present

## 2019-12-01 DIAGNOSIS — Z0000 Encounter for general adult medical examination without abnormal findings: Secondary | ICD-10-CM | POA: Insufficient documentation

## 2019-12-01 DIAGNOSIS — L819 Disorder of pigmentation, unspecified: Secondary | ICD-10-CM | POA: Diagnosis not present

## 2019-12-01 DIAGNOSIS — L821 Other seborrheic keratosis: Secondary | ICD-10-CM | POA: Diagnosis not present

## 2019-12-01 DIAGNOSIS — D2371 Other benign neoplasm of skin of right lower limb, including hip: Secondary | ICD-10-CM | POA: Diagnosis not present

## 2019-12-01 MED ORDER — ATORVASTATIN CALCIUM 80 MG PO TABS: 80.0000 mg | ORAL_TABLET | Freq: Every day | ORAL | 3 refills | Status: DC

## 2019-12-01 NOTE — Assessment & Plan Note (Signed)
Stable without red flag sx

## 2019-12-01 NOTE — Assessment & Plan Note (Signed)
Healthy woman with no at risk behaviors.   

## 2019-12-01 NOTE — Assessment & Plan Note (Signed)
Not at goal for cholesterol.  Increase atorvastatin from 40-80 mg daily.

## 2019-12-01 NOTE — Progress Notes (Signed)
    SUBJECTIVE:   CHIEF COMPLAINT / HPI:   Annual HPDP exam.   Acute issues, none Chronic issues. 1. GERD recently worse.  Contacted GI.  Told to increase PPI to bid.  Symptomatically improved on this dose.  EGD scheduled for tomorrow. 2. Contines to have stable low back pain without sciatica.  No bowel or bladder problems.  No weakness. 3. Hypothyroid on synthyroid.  Energy and temp tolerence normal   Diet and exercise: Walking regularly.  Does resistence training.  Healthy diet. Habits.  Never smoker. Immunizations.  Completed moderna vaccine series.  Needs tetanus.  Showing due for pneumovax 23.  Nurse will check state data base Screening.  Needs lipid panel  Otherwise up to date.  PERTINENT  PMH / PSH: No chest pain, DOE, abd pain, bleeding, weakness or numbness.  Denies change in bowel, bladder, appetite.   OBJECTIVE:   BP 132/64   Pulse 61   Ht 5\' 2"  (1.575 m)   Wt 141 lb 6.4 oz (64.1 kg)   SpO2 98%   BMI 25.86 kg/m   HEENT WNL Neck supple Lungs clear Cardiac RRR without m or g Abd benign Ext no edema Neuro, motor, sensory, gait, mentation and mood all grossly normal  ASSESSMENT/PLAN:   CAD (coronary artery disease), native coronary artery Not at goal for cholesterol.  Increase atorvastatin from 40-80 mg daily.  Preventative health care Healthy woman with no at risk behaviors.    Low back pain without sciatica Stable without red flag sx  History of hypothyroidism TSH normal on current meds.  No change.     Rhonda Resides, MD [place]

## 2019-12-01 NOTE — Assessment & Plan Note (Signed)
TSH normal on current meds.  No change.

## 2019-12-02 NOTE — Progress Notes (Signed)
Reviewed and agree with documentation and assessment and plan. K. Veena Rabon Scholle , MD   

## 2019-12-04 ENCOUNTER — Encounter (INDEPENDENT_AMBULATORY_CARE_PROVIDER_SITE_OTHER): Payer: Self-pay

## 2019-12-07 ENCOUNTER — Other Ambulatory Visit: Payer: Self-pay

## 2019-12-07 ENCOUNTER — Ambulatory Visit
Admission: RE | Admit: 2019-12-07 | Discharge: 2019-12-07 | Disposition: A | Discharge status: Home / Self Care | Payer: Medicare Other | Source: Ambulatory Visit | Attending: Family Medicine | Admitting: Family Medicine

## 2019-12-07 DIAGNOSIS — M85852 Other specified disorders of bone density and structure, left thigh: Secondary | ICD-10-CM | POA: Diagnosis not present

## 2019-12-07 DIAGNOSIS — M8589 Other specified disorders of bone density and structure, multiple sites: Secondary | ICD-10-CM

## 2019-12-07 DIAGNOSIS — Z780 Asymptomatic menopausal state: Secondary | ICD-10-CM | POA: Diagnosis not present

## 2019-12-08 ENCOUNTER — Ambulatory Visit (HOSPITAL_COMMUNITY)
Admission: RE | Admit: 2019-12-08 | Discharge: 2019-12-08 | Disposition: A | Discharge status: Home / Self Care | Payer: Medicare Other | Source: Ambulatory Visit | Attending: Physician Assistant | Admitting: Physician Assistant

## 2019-12-08 DIAGNOSIS — R1314 Dysphagia, pharyngoesophageal phase: Secondary | ICD-10-CM

## 2019-12-08 DIAGNOSIS — K219 Gastro-esophageal reflux disease without esophagitis: Secondary | ICD-10-CM | POA: Diagnosis not present

## 2019-12-08 DIAGNOSIS — K449 Diaphragmatic hernia without obstruction or gangrene: Secondary | ICD-10-CM | POA: Diagnosis not present

## 2019-12-09 ENCOUNTER — Telehealth: Payer: Self-pay | Admitting: Physician Assistant

## 2019-12-09 NOTE — Telephone Encounter (Signed)
See results note 9/23

## 2019-12-09 NOTE — Telephone Encounter (Signed)
Pt is requesting a call back from Patty. 

## 2019-12-29 NOTE — Progress Notes (Signed)
Office Visit Note  Patient: Rhonda Brewer             Date of Birth: 18-Jul-1950           MRN: [id number]             PCP: [name], MD Referring: [name], MD Visit Date: 01/11/2020 Occupation: @GUAROCC @  Subjective:  Rhonda Brewer   History of Present Illness: Rhonda Brewer is a 69 y.o. female with history of scleroderma and osteopenia.  She has not noticed any progression in her symptoms.  She does experience intermittent symptoms of Rhonda Brewer in her hands and feet.  She denies any digital ulcerations.  She continues to take aspirin 81 mg 1 tablet by mouth daily.  She states that her right middle finger occasionally locks but overall the triggering has been less frequent.  She denies any increased joint pain or joint swelling recently.  She denies any shortness of breath, chest pain, or chest tightness.  She is followed by her cardiologist on a yearly basis and is due for an updated pulmonology appointment.  She denies any dysphagia or constipation recently. She updated her DEXA on 12/07/2019.  She continues take calcium and vitamin D supplement.  She denies any recent falls or fractures.   Activities of Daily Living:  Patient reports morning stiffness for 20  minutes.   Patient Denies nocturnal pain.  Difficulty dressing/grooming: Denies Difficulty climbing stairs: Denies Difficulty getting out of chair: Denies Difficulty using hands for taps, buttons, cutlery, and/or writing: Denies  Review of Systems  Constitutional: Negative for fatigue.  HENT: Negative for mouth sores, mouth dryness and nose dryness.   Eyes: Positive for dryness ([place] ). Negative for pain and visual disturbance.  Respiratory: Negative for cough, hemoptysis, shortness of breath and difficulty breathing.   Cardiovascular: Negative for chest pain, palpitations, hypertension and swelling in legs/feet.  Gastrointestinal: Negative for blood in stool, constipation and diarrhea.  Endocrine:  Negative for increased urination.  Genitourinary: Negative for painful urination.  Musculoskeletal: Positive for morning stiffness. Negative for arthralgias, joint pain, joint swelling, myalgias, muscle weakness, muscle tenderness and myalgias.  Skin: Negative for color change, pallor, rash, hair loss, nodules/bumps, skin tightness, ulcers and sensitivity to sunlight.  Allergic/Immunologic: Negative for susceptible to infections.  Neurological: Negative for dizziness, numbness, headaches and weakness.  Hematological: Negative for swollen glands.  Psychiatric/Behavioral: Negative for depressed mood and sleep disturbance. The patient is not nervous/anxious.     PMFS History:  Patient Active Problem List   Diagnosis Date Noted  . Preventative health care 12/01/2019  . Constipation 11/12/2018  . Carpal tunnel syndrome 05/29/2017  . Low back pain without sciatica 04/29/2017  . Screening for breast cancer 09/04/2016  . H/O cardiac radiofrequency ablation 06/26/2016  . CAD (coronary artery disease), native coronary artery 06/19/2016  . History of hypothyroidism 06/19/2016  . History of uterine cancer 06/19/2016  . Scleroderma ([place]) 06/16/2016  . High risk medication use 06/16/2016  . Osteopenia of multiple sites 06/16/2016    Past Medical History:  Diagnosis Date  . Allergy   . Arthritis   . CAD (coronary artery disease)    a. DES to RCA 11/2015  . Cancer [facility])    Uterine 2008  . Heart murmur   . Hyperlipidemia   . Osteoporosis   . Thyroid disease     Family History  Problem Relation Age of Onset  . Asthma Maternal Aunt   . Heart disease Mother   . Diabetes  Mother   . Heart disease Father   . Stomach cancer Brother   . Diabetes Brother   . Colon cancer Neg Hx   . Esophageal cancer Neg Hx   . Rectal cancer Neg Hx    Past Surgical History:  Procedure Laterality Date  . ABDOMINAL HYSTERECTOMY    . APPENDECTOMY    . CARDIAC ELECTROPHYSIOLOGY MAPPING AND ABLATION  04/19/2003   . CARPAL TUNNEL RELEASE Right 10/15/2017  . CORONARY ANGIOPLASTY WITH STENT PLACEMENT    . RIGHT/LEFT HEART CATH AND CORONARY ANGIOGRAPHY [name] 08/14/2017   Procedure: RIGHT/LEFT HEART CATH AND CORONARY ANGIOGRAPHY;  Surgeon: Jolaine Artist, MD;  Location: [place] [facility];  Service: Cardiovascular;  Laterality: [name];  . ULTRASOUND GUIDANCE FOR VASCULAR ACCESS  08/14/2017   Procedure: Ultrasound Guidance For Vascular Access;  Surgeon: Jolaine Artist, MD;  Location: [place] [facility];  Service: Cardiovascular;;   Social History   Social History Narrative   Lives in home with husband. Daughters live out of state. Step-daughter in [place]. Has 8 grandchildren. Lives in 2 level home but most living is on first floor. Stairs have handrails, no problem with stairs. No grab bars in bathroom. No tripping hazards. Smoke alarms present.   No pets.    Eats a good variety of foods, husband is a cook. Eats meats, fruits, vegetables. Drinks water mostly. Occasional tea.   Goes to gym 5 times a week. Enjoys reading, sewing, movies. Walking when weather is nice.    Wears seat belt in car.    Immunization History  Administered Date(s) Administered  . Fluad Quad(high Dose 65+) 11/29/2019  . Influenza Split 01/17/2016  . Influenza,inj,Quad PF,6+ Mos 11/12/2018  . Influenza-Unspecified 04/02/2017, 12/08/2017  . Moderna SARS-COVID-2 Vaccination 04/09/2019, 05/14/2019  . Pneumococcal Conjugate-13 10/26/2014  . Pneumococcal Polysaccharide-23 11/12/2018  . Pneumococcal-Unspecified 09/14/2013  . Td 07/18/2011     Objective: Vital Signs: BP 117/76 (BP Location: Left Arm, Patient Position: Sitting, Cuff Size: Normal)   Pulse (!) 59   Resp 16   Ht 5\' 3"  (1.6 m)   Wt 137 lb (62.1 kg)   BMI 24.27 kg/m    Physical Exam Vitals and nursing note reviewed.  Constitutional:      Appearance: She is well-developed.  HENT:     Head: Normocephalic and atraumatic.  Eyes:     Conjunctiva/sclera: Conjunctivae  normal.  Cardiovascular:     Rate and Rhythm: Normal rate and regular rhythm.     Heart sounds: Normal heart sounds.  Pulmonary:     Effort: Pulmonary effort is normal.     Breath sounds: Normal breath sounds.  Abdominal:     General: Bowel sounds are normal.     Palpations: Abdomen is soft.  Musculoskeletal:     Cervical back: Normal range of motion.  Lymphadenopathy:     Cervical: No cervical adenopathy.  Skin:    General: Skin is warm and dry.     Capillary Refill: Capillary refill takes 2 to 3 seconds.     Comments: Capillary dropout and dry nailbed.   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   Neurological:     Mental Status: She is alert and oriented to person, place, and time.  Psychiatric:        Behavior: Behavior normal.      Musculoskeletal Exam: C-spine, thoracic spine, and lumbar spine good ROM.  Shoulder joints, elbow joints, wrist  joints, MCPs, PIPs, and DIPs good ROM with no synovitis.  Complete fist formation bilaterally.  Hip joints, knee joints, ankle joints, MTPs, PIPs, and DIPs good ROM with no discomfort.  No warmth or effusion of knee joints. No ankle joint tenderness or inflammation.    CDAI Exam: CDAI Score: -- Patient Global: --; Provider Global: -- Swollen: --; Tender: -- Joint Exam 01/11/2020   No joint exam has been documented for this visit   There is currently no information documented on the homunculus. Go to the Rheumatology activity and complete the homunculus joint exam.  Investigation: No additional findings.  Imaging: No results found.  Recent Labs: Lab Results  Component Value Date   WBC 6.2 05/05/2019   HGB 14.9 05/05/2019   PLT 220 05/05/2019   NA 140 05/05/2019   K 4.1 05/05/2019   CL 106 05/05/2019   CO2 22 05/05/2019   GLUCOSE 98 05/05/2019   BUN 13 05/05/2019   CREATININE 0.81 05/05/2019   BILITOT 0.4 05/05/2019   ALKPHOS 64  06/19/2016   AST 24 05/05/2019   ALT 16 05/05/2019   PROT 6.7 05/05/2019   ALBUMIN 4.4 06/19/2016   CALCIUM 9.6 05/05/2019   GFRAA 86 05/05/2019    Speciality Comments: PLQ Eye Exam: 08/07/17 PLQ toxcity,  PLQ discotinued @ [facility]  Procedures:  No procedures performed Allergies: Avelox [moxifloxacin hcl in nacl] and Other   Assessment / Plan:     Visit Diagnoses: Scleroderma ([place]) - Limited systemic with Raynauds, Telengectesia's, sclerodactyly, arthralgias, erosions in right fifth and left third DIP, ANA centromere: She is not exhibiting any signs or symptoms of disease progression.  Sclerodactyly noted, right hand greater than left hand.  She has scattered telangiectasias and poikiloderma.  She has intermittent symptoms of Rhonda Brewer but no digital ulcerations or signs of gangrene were noted on examination today.  Capillary dropout was noted using the dermatoscope.  She has delayed capillary refill 2 to 3 seconds.  She was encouraged to continue to take aspirin 81 mg daily.  She does not have any signs of inflammatory arthritis at this time.  No synovitis was noted. She has no other clinical features of autoimmune disease. She does not require immunosuppressive therapy at this time. She is followed by Dr. Haroldine Laws on a yearly basis and plans on calling her pulmonologist to schedule a routine follow-up visit as well.  PFTs were updated on 03/03/19.  She is not experiencing any chest pain, chest tightness, or shortness of breath at this time. She has not been experiencing any symptoms of dysphagia or constipation recently.  She denies any other new or worsening symptoms.  She will follow-up in the office in 6 months. - Plan: COMPLETE METABOLIC PANEL WITH GFR, CBC with Differential/Platelet, Urinalysis, Routine w reflex microscopic  High risk medication use - Dr. Katy Fitch advised patient to stop taking PLQ.  She has been off of Plaquenil since May 2019. She does not require any  immunosuppressive agents at this time.  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  She has delayed capillary refill (2-3 seconds) with capillary dropout noted.  No digital ulcerations or signs of gangrene were noted.  We discussed the importance of avoiding triggers.  She was encouraged to keep her core body temperature warm as well as wearing gloves and thick socks.  She will continue taking aspirin 81 mg 1 tablet by mouth daily.  She was advised to notify us if she develops any new or worsening  symptoms.  Thrombocytopenia ([place]): Platelet count was 220 on 05/05/2019.  We will recheck CBC today.  Osteopenia of multiple sites - DEXA 12/07/2019:The BMD measured at Femur Neck Left is 0.827 g/cm2 with a T-score of -1.5.DEXA on 09/06/16: BMD measured at left FN is 0.820 with T-score -1.6.  She continues to take a calcium and vitamin D supplement.  She has not had any recent falls or fractures.  Trigger finger, right ring finger - She had a cortisone injection on 05/05/19.  Resolved.   Trigger finger, right middle finger: She experiences intermittent locking but her symptoms have been less frequent.  Trigger middle finger of left hand - Resolved   Pes anserinus bursitis of left knee - Resolved   Other medical conditions are listed as follows:   History of coronary artery disease  History of uterine cancer  History of hypothyroidism  H/O cardiac radiofrequency ablation  Orders: Orders Placed This Encounter  Procedures  . COMPLETE METABOLIC PANEL WITH GFR  . CBC with Differential/Platelet  . Urinalysis, Routine w reflex microscopic   No orders of the defined types were placed in this encounter.     Follow-Up Instructions: Return in about 6 months (around 07/11/2020) for Scleroderma.   Ofilia Neas, PA-C  Note - This record has been created using Dragon software.  Chart creation errors have been sought, but may not always  have been  located. Such creation errors do not reflect on  the standard of medical care.

## 2020-01-04 ENCOUNTER — Encounter: Payer: Self-pay | Admitting: Family Medicine

## 2020-01-11 ENCOUNTER — Encounter: Payer: Self-pay | Admitting: Physician Assistant

## 2020-01-11 ENCOUNTER — Other Ambulatory Visit: Payer: Self-pay

## 2020-01-11 ENCOUNTER — Ambulatory Visit: Payer: Medicare Other | Admitting: Physician Assistant

## 2020-01-11 VITALS — BP 117/76 | HR 59 | Resp 16 | Ht 63.0 in | Wt 137.0 lb

## 2020-01-11 DIAGNOSIS — Z8679 Personal history of other diseases of the circulatory system: Secondary | ICD-10-CM

## 2020-01-11 DIAGNOSIS — M7052 Other bursitis of knee, left knee: Secondary | ICD-10-CM

## 2020-01-11 DIAGNOSIS — I7300 Raynaud's syndrome without gangrene: Secondary | ICD-10-CM | POA: Diagnosis not present

## 2020-01-11 DIAGNOSIS — M65331 Trigger finger, right middle finger: Secondary | ICD-10-CM

## 2020-01-11 DIAGNOSIS — M65332 Trigger finger, left middle finger: Secondary | ICD-10-CM

## 2020-01-11 DIAGNOSIS — M8589 Other specified disorders of bone density and structure, multiple sites: Secondary | ICD-10-CM | POA: Diagnosis not present

## 2020-01-11 DIAGNOSIS — Z9889 Other specified postprocedural states: Secondary | ICD-10-CM

## 2020-01-11 DIAGNOSIS — M349 Systemic sclerosis, unspecified: Secondary | ICD-10-CM

## 2020-01-11 DIAGNOSIS — D696 Thrombocytopenia, unspecified: Secondary | ICD-10-CM | POA: Diagnosis not present

## 2020-01-11 DIAGNOSIS — Z79899 Other long term (current) drug therapy: Secondary | ICD-10-CM | POA: Diagnosis not present

## 2020-01-11 DIAGNOSIS — Z8542 Personal history of malignant neoplasm of other parts of uterus: Secondary | ICD-10-CM

## 2020-01-11 DIAGNOSIS — M65341 Trigger finger, right ring finger: Secondary | ICD-10-CM

## 2020-01-11 DIAGNOSIS — Z8639 Personal history of other endocrine, nutritional and metabolic disease: Secondary | ICD-10-CM

## 2020-01-12 LAB — URINALYSIS, ROUTINE W REFLEX MICROSCOPIC
Bacteria, UA: NONE SEEN /HPF
Bilirubin Urine: NEGATIVE
Glucose, UA: NEGATIVE
Hgb urine dipstick: NEGATIVE
Hyaline Cast: NONE SEEN /LPF
Ketones, ur: NEGATIVE
Nitrite: NEGATIVE
Protein, ur: NEGATIVE
RBC / HPF: NONE SEEN /HPF (ref 0–2)
Specific Gravity, Urine: 1.008 (ref 1.001–1.03)
Squamous Epithelial / HPF: NONE SEEN /HPF (ref <=5)
pH: 5.5 (ref 5.0–8.0)

## 2020-01-12 LAB — CBC WITH DIFFERENTIAL/PLATELET
Absolute Monocytes: 549 cells/uL (ref 200–950)
Basophils Absolute: 20 cells/uL (ref 0–200)
Basophils Relative: 0.4 %
Eosinophils Absolute: 201 cells/uL (ref 15–500)
Eosinophils Relative: 4.1 %
HCT: 42.3 % (ref 35.0–45.0)
Hemoglobin: 14.2 g/dL (ref 11.7–15.5)
Lymphs Abs: 1789 cells/uL (ref 850–3900)
MCH: 32 pg (ref 27.0–33.0)
MCHC: 33.6 g/dL (ref 32.0–36.0)
MCV: 95.3 fL (ref 80.0–100.0)
MPV: 11.2 fL (ref 7.5–12.5)
Monocytes Relative: 11.2 %
Neutro Abs: 2342 cells/uL (ref 1500–7800)
Neutrophils Relative %: 47.8 %
Platelets: 198 10*3/uL (ref 140–400)
RBC: 4.44 10*6/uL (ref 3.80–5.10)
RDW: 12 % (ref 11.0–15.0)
Total Lymphocyte: 36.5 %
WBC: 4.9 10*3/uL (ref 3.8–10.8)

## 2020-01-12 LAB — COMPLETE METABOLIC PANEL WITH GFR
AG Ratio: 1.8 (calc) (ref 1.0–2.5)
ALT: 14 U/L (ref 6–29)
AST: 21 U/L (ref 10–35)
Albumin: 4.4 g/dL (ref 3.6–5.1)
Alkaline phosphatase (APISO): 69 U/L (ref 37–153)
BUN: 12 mg/dL (ref 7–25)
CO2: 24 mmol/L (ref 20–32)
Calcium: 9.6 mg/dL (ref 8.6–10.4)
Chloride: 109 mmol/L (ref 98–110)
Creat: 0.72 mg/dL (ref 0.50–0.99)
GFR, Est African American: 99 mL/min/{1.73_m2} (ref >=60)
GFR, Est Non African American: 85 mL/min/{1.73_m2} (ref >=60)
Globulin: 2.5 g/dL (calc) (ref 1.9–3.7)
Glucose, Bld: 87 mg/dL (ref 65–99)
Potassium: 4 mmol/L (ref 3.5–5.3)
Sodium: 141 mmol/L (ref 135–146)
Total Bilirubin: 0.6 mg/dL (ref 0.2–1.2)
Total Protein: 6.9 g/dL (ref 6.1–8.1)

## 2020-01-12 NOTE — Progress Notes (Signed)
UA revealed 1+ leukocytes.  Negative for nitrites and bacteria.   CBC and CMP WNL.

## 2020-01-13 MED ORDER — PANTOPRAZOLE SODIUM 40 MG PO TBEC: 40.0000 mg | DELAYED_RELEASE_TABLET | Freq: Two times a day (BID) | ORAL | 3 refills | Status: DC

## 2020-01-28 ENCOUNTER — Other Ambulatory Visit: Payer: Self-pay | Admitting: Family Medicine

## 2020-01-28 DIAGNOSIS — Z1231 Encounter for screening mammogram for malignant neoplasm of breast: Secondary | ICD-10-CM

## 2020-03-21 NOTE — Progress Notes (Signed)
PCP: Dr. Leveda Anna Referring: Dr. Berenice Bouton    HPI:  Ms. Vanantwerp is a 70 y/o woman with h/o scleroderma, CAD s/p stent 9/17 , SVT s/p ablation 2/05 referred by Dr. Titus Dubin for screening for [facility] in setting of scleroderma.   Previously lived [place]. 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   In 9/17 had heart cath due to positive stress test. At time had exertional fatigue and dyspnea and arm tingling. No CP.    Cath 11/24/15 LM: mild irregs LAD: 20% mid LCX: mild irreg RCA: mRCA 99% ->Xience Alpine DES 3.5x70mm  Echo 11/22/15: LVEF 55-60% Mild AI. Normal RV. Mild TR.  Echo 08/27/16: EF 60-65%,  RV normal Mild AI. No RV strain or PAH.   PFTs 08/2016 FEV1 2.11 (88%) FVC 2.86 (91%) DLCO 63%  PFTs 12/20 FEV1 2.32 (99%) FVC 2.90 (94%) DLCO 85%  She was seen in the HF clinic for initial evaluation in June 2018. It was felt that her dyspnea could be due to Brilinta. She was switched to Plavix. Chest CT was ordered and showed no interstitial lung disease.   Since we last saw her underwent cath 5/19 which showed very mild CAD. No evidence. There was a small mstep-up in saturations at RA level suggestive of anomalous pulmonary vein or shunt at atrial level    Mid RCA lesion is 20% stenosed.  Prox LAD lesion is 40% stenosed.  Here for routine f/u. Doing great. Walking at least 3 miles every day. No CP or SOB. Dr. Leveda Anna recently increased atorva and having some cramps. No weakness. No edema, orthopnea or PND. Still struggling with Raynaud's. Has not seen Dr. Corlis Hove for 1 year.   Echo EF 55-60% mild MR, mild to mod AI Personally reviewed      Cath 5/19:  Ao = 137/66 (94) LV = 141/13 RA = 2 RV = 20/4 PA = 19/6 (12) PCW = 7 Fick cardiac output/index = 5.3/3.1 Thermo CO/CI = 3.6/2.2 PVR = 0.7 WU SVR 1402 Ao sat = 99% PA sat = 79%, 80% High SVC sat = 68% RA sat = 78% RV sat = 75% Qp/Qs = 1.11 Assessment:  1. CAD  with patent RCA stent and mild non-obstructive CAD in LAD 2. Normal LVEF 60-65% 3. Normal right heart pressures with no evidence of PAH 4. Small step-up in saturations at RA level suggestive of anomalous pulmonary vein or shunt at atrial level      Past Medical History:  Diagnosis Date  . Allergy   . Arthritis   . CAD (coronary artery disease)    a. DES to RCA 11/2015  . Cancer [facility])    Uterine 2008  . CHF (congestive heart failure) (HCC)   . Heart murmur   . Hyperlipidemia   . Osteoporosis   . Thyroid disease     Current Outpatient Medications  Medication Sig Dispense Refill  . albuterol (PROVENTIL HFA;VENTOLIN HFA) 108 (90 Base) MCG/ACT inhaler INHALE 2 PUFFS INTO THE LUNGS EVERY 4 HOURS AS NEEDED FOR WHEEZING OR SHORTNESS OF BREATH 90 g 6  . aspirin EC 81 MG tablet Take 81 mg by mouth daily.     Marland Kitchen atorvastatin (LIPITOR) 80 MG tablet Take 1 tablet (80 mg total) by mouth daily. 90 tablet 3  . Calcium Carbonate-Vitamin D (CALCIUM-VITAMIN D3 PO) Take 1 tablet by mouth daily.    . cycloSPORINE (RESTASIS) 0.05 % ophthalmic emulsion Place 1 drop into both eyes  2 (two) times daily.    Marland Kitchen levothyroxine (SYNTHROID) 75 MCG tablet TAKE 1 TABLET BY MOUTH  DAILY BEFORE BREAKFAST 90 tablet 3  . Multiple Vitamin (MULTI-VITAMINS) TABS Take 1 tablet by mouth daily.     . pantoprazole (PROTONIX) 40 MG tablet Take 1 tablet (40 mg total) by mouth 2 (two) times daily before a meal. 180 tablet 3  . vitamin B-12 (CYANOCOBALAMIN) 100 MCG tablet Take 100 mcg by mouth daily.     No current facility-administered medications for this encounter.     Allergies  Allergen Reactions  . Avelox [Moxifloxacin Hcl In Nacl] Other (See Comments)    dizziness  . Other Other (See Comments)    SEASONAL     Social History   Socioeconomic History  . Marital status: Married    Spouse name: Cay Schillings  . Number of children: 2  . Years of education: 60  . Highest education level: 12th grade  Occupational  History  . Occupation: retired    Comment: Scientist, water quality  Tobacco Use  . Smoking status: Never Smoker  . Smokeless tobacco: Never Used  Vaping Use  . Vaping Use: Never used  Substance and Sexual Activity  . Alcohol use: Yes    Comment: occasional wine  . Drug use: No  . Sexual activity: Yes    Birth control/protection: Surgical    Comment: hysterectomy  Other Topics Concern  . Not on file  Social History Narrative   Lives in home with husband. Daughters live out of state. Step-daughter in [place]. Has 8 grandchildren. Lives in 2 level home but most living is on first floor. Stairs have handrails, no problem with stairs. No grab bars in bathroom. No tripping hazards. Smoke alarms present.   No pets.    Eats a good variety of foods, husband is a cook. Eats meats, fruits, vegetables. Drinks water mostly. Occasional tea.   Goes to gym 5 times a week. Enjoys reading, sewing, movies. Walking when weather is nice.    Wears seat belt in car.    Social Determinants of Health   Financial Resource Strain: Not on file  Food Insecurity: Not on file  Transportation Needs: Not on file  Physical Activity: Not on file  Stress: Not on file  Social Connections: Not on file  Intimate Partner Violence: Not on file    Family History  Problem Relation Age of Onset  . Asthma Maternal Aunt   . Heart disease Mother   . Diabetes Mother   . Heart disease Father   . Stomach cancer Brother   . Diabetes Brother   . Colon cancer Neg Hx   . Esophageal cancer Neg Hx   . Rectal cancer Neg Hx     PHYSICAL EXAM: Vitals:   03/22/20 1456  BP: 112/64  Pulse: 67  SpO2: 96%    General:  Well appearing. No resp difficulty HEENT: normal +telangectacias   Neck: supple. no JVD. Carotids 2+ bilat; no bruits. No lymphadenopathy or thryomegaly appreciated. Cor: PMI nondisplaced. Regular rate & rhythm. No rubs, gallops or murmurs. Lungs: clear Abdomen: soft, nontender, nondistended. No  hepatosplenomegaly. No bruits or masses. Good bowel sounds. Extremities: no cyanosis, clubbing, rash, edema + joint thickening  Neuro: alert & orientedx3, cranial nerves grossly intact. moves all 4 extremities w/o difficulty. Affect pleasant  ASSESSMENT & PLAN:  1. CAD s/p RCA stent in 11/2015: - repeat cath 5/19 for recurrent CP show mild non-obstructive CAD - results reviewed with her - very active.  No s/s ischmeia - Continue ASA and atorvastatin. Atorva dose recently increased and having some cramps. I told her to drop back down and see if cramps improved. If so then would increase dose back up and see if they recurred  2. Scleroderma - DLCO mildly reduced on PFTs 6/18. No R heart strain on echo. CT without ILD.  - RHC 5/19 no PAH - PFTs 12/20 with normalization of DLCO.  - No evidence of scleroderma-related lung disease (PAH or fibrosis).  - Echo today 03/22/2020 No evidence of PAH or RV strain. Continue routine surveillance with echo and PFTs  3. Hypertension -Blood pressure well controlled. Continue current regimen.  4. Valvular disease - mild MR and mild-mod AI - will follow  Glori Bickers, MD  12:54 PM

## 2020-03-22 ENCOUNTER — Other Ambulatory Visit: Payer: Self-pay

## 2020-03-22 ENCOUNTER — Ambulatory Visit (HOSPITAL_BASED_OUTPATIENT_CLINIC_OR_DEPARTMENT_OTHER)
Admission: RE | Admit: 2020-03-22 | Discharge: 2020-03-22 | Disposition: A | Discharge status: Home / Self Care | Payer: Medicare Other | Source: Ambulatory Visit | Attending: Internal Medicine | Admitting: Internal Medicine

## 2020-03-22 ENCOUNTER — Ambulatory Visit (HOSPITAL_COMMUNITY)
Admission: RE | Admit: 2020-03-22 | Discharge: 2020-03-22 | Disposition: A | Discharge status: Home / Self Care | Payer: Medicare Other | Source: Ambulatory Visit | Attending: Internal Medicine | Admitting: Internal Medicine

## 2020-03-22 ENCOUNTER — Encounter (HOSPITAL_COMMUNITY): Payer: Self-pay | Admitting: Internal Medicine

## 2020-03-22 VITALS — BP 112/64 | HR 67 | Wt 137.2 lb

## 2020-03-22 DIAGNOSIS — Z7902 Long term (current) use of antithrombotics/antiplatelets: Secondary | ICD-10-CM | POA: Insufficient documentation

## 2020-03-22 DIAGNOSIS — Z7982 Long term (current) use of aspirin: Secondary | ICD-10-CM | POA: Diagnosis not present

## 2020-03-22 DIAGNOSIS — I471 Supraventricular tachycardia: Secondary | ICD-10-CM | POA: Insufficient documentation

## 2020-03-22 DIAGNOSIS — I2510 Atherosclerotic heart disease of native coronary artery without angina pectoris: Secondary | ICD-10-CM | POA: Diagnosis not present

## 2020-03-22 DIAGNOSIS — I110 Hypertensive heart disease with heart failure: Secondary | ICD-10-CM | POA: Diagnosis not present

## 2020-03-22 DIAGNOSIS — I7300 Raynaud's syndrome without gangrene: Secondary | ICD-10-CM | POA: Insufficient documentation

## 2020-03-22 DIAGNOSIS — M349 Systemic sclerosis, unspecified: Secondary | ICD-10-CM

## 2020-03-22 DIAGNOSIS — I38 Endocarditis, valve unspecified: Secondary | ICD-10-CM | POA: Diagnosis not present

## 2020-03-22 DIAGNOSIS — I351 Nonrheumatic aortic (valve) insufficiency: Secondary | ICD-10-CM

## 2020-03-22 DIAGNOSIS — Z955 Presence of coronary angioplasty implant and graft: Secondary | ICD-10-CM | POA: Insufficient documentation

## 2020-03-22 DIAGNOSIS — I509 Heart failure, unspecified: Secondary | ICD-10-CM | POA: Insufficient documentation

## 2020-03-22 DIAGNOSIS — Z8249 Family history of ischemic heart disease and other diseases of the circulatory system: Secondary | ICD-10-CM | POA: Insufficient documentation

## 2020-03-22 DIAGNOSIS — Z79899 Other long term (current) drug therapy: Secondary | ICD-10-CM | POA: Insufficient documentation

## 2020-03-22 HISTORY — DX: Heart failure, unspecified: I50.9

## 2020-03-22 LAB — ECHOCARDIOGRAM COMPLETE
Area-P 1/2: 2.6 cm2
P 1/2 time: 518 msec
S' Lateral: 2.5 cm

## 2020-03-22 NOTE — Patient Instructions (Signed)
Your physician has requested that you have an echocardiogram. Echocardiography is a painless test that uses sound waves to create images of your heart. It provides your doctor with information about the size and shape of your heart and how well your heart's chambers and valves are working. This procedure takes approximately one hour. There are no restrictions for this procedure.  Your physician recommends that you schedule a follow-up appointment in: 12 months  If you have any questions or concerns before your next appointment please send [place] a message through [place] or call our office at [phone].    TO LEAVE A MESSAGE FOR THE NURSE SELECT OPTION 2, PLEASE LEAVE A MESSAGE INCLUDING: . YOUR NAME . DATE OF BIRTH . CALL BACK NUMBER . REASON FOR CALL**this is important as we prioritize the call backs  YOU WILL RECEIVE A CALL BACK THE SAME DAY AS LONG AS YOU CALL BEFORE 4:00 PM  At the [facility], you and your health needs are our priority. As part of our continuing mission to provide you with exceptional heart care, we have created designated Provider Care Teams. These Care Teams include your primary Cardiologist (physician) and Advanced Practice Providers (APPs- Physician Assistants and Nurse Practitioners) who all work together to provide you with the care you need, when you need it.   You may see any of the following providers on your designated Care Team at your next follow up: Marland Kitchen Dr Arvilla Meres . Dr Marca Ancona . Tonye Becket, NP . Robbie Lis, PA . Karle Plumber, PharmD   Please be sure to bring in all your medications bottles to every appointment.

## 2020-03-22 NOTE — Progress Notes (Signed)
  Echocardiogram 2D Echocardiogram has been performed.  Rhonda Brewer 03/22/2020, 2:45 PM

## 2020-04-11 DIAGNOSIS — Z6824 Body mass index (BMI) 24.0-24.9, adult: Secondary | ICD-10-CM | POA: Diagnosis not present

## 2020-04-11 DIAGNOSIS — Z1231 Encounter for screening mammogram for malignant neoplasm of breast: Secondary | ICD-10-CM | POA: Diagnosis not present

## 2020-04-13 ENCOUNTER — Encounter: Payer: Self-pay | Admitting: Family Medicine

## 2020-06-06 DIAGNOSIS — M069 Rheumatoid arthritis, unspecified: Secondary | ICD-10-CM | POA: Diagnosis not present

## 2020-06-06 DIAGNOSIS — H25813 Combined forms of age-related cataract, bilateral: Secondary | ICD-10-CM | POA: Diagnosis not present

## 2020-06-06 DIAGNOSIS — Z79899 Other long term (current) drug therapy: Secondary | ICD-10-CM | POA: Diagnosis not present

## 2020-06-06 DIAGNOSIS — H04123 Dry eye syndrome of bilateral lacrimal glands: Secondary | ICD-10-CM | POA: Diagnosis not present

## 2020-06-27 NOTE — Progress Notes (Signed)
Office Visit Note  Patient: Rhonda Brewer             Date of Birth: 10-13-1950           MRN: [id number]             PCP: Rhonda Resides, MD Referring: Rhonda Resides, MD Visit Date: 07/11/2020 Occupation: @GUAROCC @  Subjective:  Other (Right hand pain and right knee pain )   History of Present Illness: Rhonda Brewer is a 70 y.o. female with a history of limited systemic sclerosis.  She has not noticed any increase in skin tightness but has noticed decreased grip strength.  She states Raynauds symptoms are tolerable.  She has been having pain and discomfort in her right hand and her right knee joint.  The trigger fingers have improved.  She has not noticed recurrence of anserine bursitis.  She has seen Dr. Haroldine Laws but could not get an appointment with Dr. Chase Caller for PFTs.  Activities of Daily Living:  Patient reports morning stiffness for 30 minutes.   Patient Denies nocturnal pain.  Difficulty dressing/grooming: Denies Difficulty climbing stairs: Denies Difficulty getting out of chair: Denies Difficulty using hands for taps, buttons, cutlery, and/or writing: Denies  Review of Systems  Constitutional: Negative for fatigue.  HENT: Positive for mouth dryness. Negative for mouth sores and nose dryness.   Eyes: Positive for dryness. Negative for pain and itching.  Respiratory: Negative for shortness of breath and difficulty breathing.   Cardiovascular: Negative for chest pain and palpitations.  Gastrointestinal: Negative for blood in stool, constipation and diarrhea.  Endocrine: Negative for increased urination.  Genitourinary: Negative for difficulty urinating.  Musculoskeletal: Positive for arthralgias, joint pain, joint swelling and morning stiffness. Negative for myalgias, muscle tenderness and myalgias.  Skin: Negative for color change, rash and redness.  Allergic/Immunologic: Negative for susceptible to infections.  Neurological: Negative for dizziness, numbness,  headaches, memory loss and weakness.  Hematological: Positive for bruising/bleeding tendency.  Psychiatric/Behavioral: Negative for confusion.    PMFS History:  Patient Active Problem List   Diagnosis Date Noted  . Preventative health care 12/01/2019  . Constipation 11/12/2018  . Carpal tunnel syndrome 05/29/2017  . Low back pain without sciatica 04/29/2017  . Screening for breast cancer 09/04/2016  . H/O cardiac radiofrequency ablation 06/26/2016  . CAD (coronary artery disease), native coronary artery 06/19/2016  . History of hypothyroidism 06/19/2016  . History of uterine cancer 06/19/2016  . Scleroderma ([place]) 06/16/2016  . High risk medication use 06/16/2016  . Osteopenia of multiple sites 06/16/2016    Past Medical History:  Diagnosis Date  . Allergy   . Arthritis   . CAD (coronary artery disease)    a. DES to RCA 11/2015  . Cancer [facility])    Uterine 2008  . CHF (congestive heart failure) ([place])   . Heart murmur   . Hyperlipidemia   . Osteoporosis   . Thyroid disease     Family History  Problem Relation Age of Onset  . Asthma Maternal Aunt   . Heart disease Mother   . Diabetes Mother   . Heart disease Father   . Stomach cancer Brother   . Diabetes Brother   . Colon cancer Neg Hx   . Esophageal cancer Neg Hx   . Rectal cancer Neg Hx    Past Surgical History:  Procedure Laterality Date  . ABDOMINAL HYSTERECTOMY    . APPENDECTOMY    . CARDIAC ELECTROPHYSIOLOGY MAPPING AND ABLATION  04/19/2003  .  CARPAL TUNNEL RELEASE Right 10/15/2017  . CORONARY ANGIOPLASTY WITH STENT PLACEMENT    . RIGHT/LEFT HEART CATH AND CORONARY ANGIOGRAPHY [name] 08/14/2017   Procedure: RIGHT/LEFT HEART CATH AND CORONARY ANGIOGRAPHY;  Surgeon: Jolaine Artist, MD;  Location: [facility];  Service: Cardiovascular;  Laterality: [name];  . ULTRASOUND GUIDANCE FOR VASCULAR ACCESS  08/14/2017   Procedure: Ultrasound Guidance For Vascular Access;  Surgeon: Jolaine Artist, MD;  Location:  [facility];  Service: Cardiovascular;;   Social History   Social History Narrative   Lives in home with husband. Daughters live out of state. Step-daughter in [place]. Has 8 grandchildren. Lives in 2 level home but most living is on first floor. Stairs have handrails, no problem with stairs. No grab bars in bathroom. No tripping hazards. Smoke alarms present.   No pets.    Eats a good variety of foods, husband is a cook. Eats meats, fruits, vegetables. Drinks water mostly. Occasional tea.   Goes to gym 5 times a week. Enjoys reading, sewing, movies. Walking when weather is nice.    Wears seat belt in car.    Immunization History  Administered Date(s) Administered  . Fluad Quad(high Dose 65+) 11/29/2019  . Influenza Split 01/17/2016  . Influenza,inj,Quad PF,6+ Mos 11/12/2018  . Influenza-Unspecified 04/02/2017, 12/08/2017  . Moderna Sars-Covid-2 Vaccination 04/09/2019, 05/14/2019  . Pneumococcal Conjugate-13 10/26/2014  . Pneumococcal Polysaccharide-23 11/12/2018  . Pneumococcal-Unspecified 09/14/2013  . Td 07/18/2011     Objective: Vital Signs: BP 127/75 (BP Location: Left Arm, Patient Position: Sitting, Cuff Size: Normal)   Pulse 64   Resp 12   Ht 5\' 4"  (1.626 m)   Wt 133 lb (60.3 kg)   BMI 22.83 kg/m    Physical Exam Vitals and nursing note reviewed.  Constitutional:      Appearance: She is well-developed.  HENT:     Head: Normocephalic and atraumatic.  Eyes:     Conjunctiva/sclera: Conjunctivae normal.  Cardiovascular:     Rate and Rhythm: Normal rate and regular rhythm.     Heart sounds: Normal heart sounds.  Pulmonary:     Effort: Pulmonary effort is normal.     Breath sounds: Normal breath sounds.  Abdominal:     General: Bowel sounds are normal.     Palpations: Abdomen is soft.  Musculoskeletal:     Cervical back: Normal range of motion.  Lymphadenopathy:     Cervical: No cervical adenopathy.  Skin:    General: Skin is warm and dry.     Capillary  Refill: Capillary refill takes less than 2 seconds.     Comments: Sclerodactyly was noted in bilateral hands distal to the wrist.  She has nailbed capillary dropout bilaterally.  Poikiloderma was noted on bilateral hands.  Telangiectasias were noted.  No calcinosis was noted.  Neurological:     Mental Status: She is alert and oriented to person, place, and time.  Psychiatric:        Behavior: Behavior normal.      Musculoskeletal Exam: C-spine was in good range of motion.  Shoulder joints, elbow joints, wrist joints with good range of motion.  She has incomplete fist formation bilaterally due to dermal fibrosis.  No synovitis was noted.  Hip joints, knee joints, ankles were in good range of motion.  She had no tenderness over MTPs.  CDAI Exam: CDAI Score: -- Patient Global: --; Provider Global: -- Swollen: --; Tender: -- Joint Exam 07/11/2020   No joint exam has been  documented for this visit   There is currently no information documented on the homunculus. Go to the Rheumatology activity and complete the homunculus joint exam.  Investigation: No additional findings.  Imaging: No results found.  Recent Labs: Lab Results  Component Value Date   WBC 4.9 01/11/2020   HGB 14.2 01/11/2020   PLT 198 01/11/2020   NA 141 01/11/2020   K 4.0 01/11/2020   CL 109 01/11/2020   CO2 24 01/11/2020   GLUCOSE 87 01/11/2020   BUN 12 01/11/2020   CREATININE 0.72 01/11/2020   BILITOT 0.6 01/11/2020   ALKPHOS 64 06/19/2016   AST 21 01/11/2020   ALT 14 01/11/2020   PROT 6.9 01/11/2020   ALBUMIN 4.4 06/19/2016   CALCIUM 9.6 01/11/2020   GFRAA 99 01/11/2020    Speciality Comments: PLQ Eye Exam: 08/07/17 PLQ toxcity,  PLQ discotinued @ [facility]  Procedures:  No procedures performed Allergies: Avelox [moxifloxacin hcl in nacl] and Other   Assessment / Plan:     Visit Diagnoses: Scleroderma ([place]) - Limited systemic with Raynauds, Telengectesia's, sclerodactyly, arthralgias, erosions  in right fifth and left third DIP, ANA centromere: She had no synovitis on examination.  She continues to have sclerodactyly, telangiectasias and decreased fist formation due to dermal fibrosis.  Nailbed capillary dropout was noted in all digits.  Poikiloderma was noted.  She had echocardiogram with Dr. Haroldine Laws.  I reviewed his notes.  I have advised her to schedule an appointment with Dr. Chase Caller for repeat PFTs and evaluation.  High risk medication use - Dr. Katy Fitch advised patient to stop taking PLQ.  She has been off of Plaquenil since May 2019 - Plan: CBC with Differential/Platelet, COMPLETE METABOLIC PANEL WITH GFR  Raynaud's phenomenon without gangrene-currently not very active.  She had good capillary refill and her hands are warm.  Right knee pain -patient has been experiencing pain and discomfort in her right knee joint off and on.  No warmth and swelling was noted.  I have given her a handout on knee joint exercises.  We will see response to it.  Osteopenia of multiple sites - DEXA 12/07/2019:The BMD measured at Femur Neck Left is 0.827 g/cm2 with a T-score of -1.5.DEXA on 09/06/16: She will have repeat DEXA with her PCP.  History of uterine cancer  History of coronary artery disease-she has triple-vessel coronary artery disease.  She has been followed by Dr. Haroldine Laws.  History of hypothyroidism  H/O cardiac radiofrequency ablation  Orders: Orders Placed This Encounter  Procedures  . CBC with Differential/Platelet  . COMPLETE METABOLIC PANEL WITH GFR   No orders of the defined types were placed in this encounter.    Follow-Up Instructions: Return in about 6 months (around 01/10/2021) for Scleroderma.   Bo Merino, MD  Note - This record has been created using Editor, commissioning.  Chart creation errors have been sought, but may not always  have been located. Such creation errors do not reflect on  the standard of medical care.

## 2020-07-11 ENCOUNTER — Encounter: Payer: Self-pay | Admitting: Family Medicine

## 2020-07-11 ENCOUNTER — Other Ambulatory Visit: Payer: Self-pay

## 2020-07-11 ENCOUNTER — Telehealth: Payer: Self-pay | Admitting: Internal Medicine

## 2020-07-11 ENCOUNTER — Ambulatory Visit: Payer: Medicare Other | Admitting: Rheumatology

## 2020-07-11 ENCOUNTER — Encounter: Payer: Self-pay | Admitting: Rheumatology

## 2020-07-11 VITALS — BP 127/75 | HR 64 | Resp 12 | Ht 64.0 in | Wt 133.0 lb

## 2020-07-11 DIAGNOSIS — I7300 Raynaud's syndrome without gangrene: Secondary | ICD-10-CM | POA: Diagnosis not present

## 2020-07-11 DIAGNOSIS — M65331 Trigger finger, right middle finger: Secondary | ICD-10-CM

## 2020-07-11 DIAGNOSIS — M7052 Other bursitis of knee, left knee: Secondary | ICD-10-CM

## 2020-07-11 DIAGNOSIS — M65341 Trigger finger, right ring finger: Secondary | ICD-10-CM

## 2020-07-11 DIAGNOSIS — M349 Systemic sclerosis, unspecified: Secondary | ICD-10-CM | POA: Diagnosis not present

## 2020-07-11 DIAGNOSIS — M65332 Trigger finger, left middle finger: Secondary | ICD-10-CM

## 2020-07-11 DIAGNOSIS — Z8639 Personal history of other endocrine, nutritional and metabolic disease: Secondary | ICD-10-CM

## 2020-07-11 DIAGNOSIS — M25561 Pain in right knee: Secondary | ICD-10-CM | POA: Diagnosis not present

## 2020-07-11 DIAGNOSIS — M8589 Other specified disorders of bone density and structure, multiple sites: Secondary | ICD-10-CM

## 2020-07-11 DIAGNOSIS — Z8542 Personal history of malignant neoplasm of other parts of uterus: Secondary | ICD-10-CM

## 2020-07-11 DIAGNOSIS — Z8679 Personal history of other diseases of the circulatory system: Secondary | ICD-10-CM

## 2020-07-11 DIAGNOSIS — Z9889 Other specified postprocedural states: Secondary | ICD-10-CM | POA: Diagnosis not present

## 2020-07-11 DIAGNOSIS — Z79899 Other long term (current) drug therapy: Secondary | ICD-10-CM | POA: Diagnosis not present

## 2020-07-11 DIAGNOSIS — D696 Thrombocytopenia, unspecified: Secondary | ICD-10-CM

## 2020-07-11 LAB — CBC WITH DIFFERENTIAL/PLATELET
Absolute Monocytes: 541 cells/uL (ref 200–950)
Basophils Absolute: 33 cells/uL (ref 0–200)
Basophils Relative: 0.5 %
Eosinophils Absolute: 218 cells/uL (ref 15–500)
Eosinophils Relative: 3.3 %
HCT: 42.6 % (ref 35.0–45.0)
Hemoglobin: 13.9 g/dL (ref 11.7–15.5)
Lymphs Abs: 2422 cells/uL (ref 850–3900)
MCH: 31.6 pg (ref 27.0–33.0)
MCHC: 32.6 g/dL (ref 32.0–36.0)
MCV: 96.8 fL (ref 80.0–100.0)
MPV: 10.6 fL (ref 7.5–12.5)
Monocytes Relative: 8.2 %
Neutro Abs: 3386 cells/uL (ref 1500–7800)
Neutrophils Relative %: 51.3 %
Platelets: 212 10*3/uL (ref 140–400)
RBC: 4.4 10*6/uL (ref 3.80–5.10)
RDW: 12.3 % (ref 11.0–15.0)
Total Lymphocyte: 36.7 %
WBC: 6.6 10*3/uL (ref 3.8–10.8)

## 2020-07-11 LAB — COMPLETE METABOLIC PANEL WITH GFR
AG Ratio: 2 (calc) (ref 1.0–2.5)
ALT: 18 U/L (ref 6–29)
AST: 29 U/L (ref 10–35)
Albumin: 4.5 g/dL (ref 3.6–5.1)
Alkaline phosphatase (APISO): 75 U/L (ref 37–153)
BUN: 16 mg/dL (ref 7–25)
CO2: 23 mmol/L (ref 20–32)
Calcium: 9.8 mg/dL (ref 8.6–10.4)
Chloride: 105 mmol/L (ref 98–110)
Creat: 0.88 mg/dL (ref 0.50–0.99)
GFR, Est African American: 78 mL/min/{1.73_m2} (ref >=60)
GFR, Est Non African American: 67 mL/min/{1.73_m2} (ref >=60)
Globulin: 2.3 g/dL (calc) (ref 1.9–3.7)
Glucose, Bld: 86 mg/dL (ref 65–99)
Potassium: 4.2 mmol/L (ref 3.5–5.3)
Sodium: 139 mmol/L (ref 135–146)
Total Bilirubin: 0.6 mg/dL (ref 0.2–1.2)
Total Protein: 6.8 g/dL (ref 6.1–8.1)

## 2020-07-11 NOTE — Patient Instructions (Signed)
Journal for Nurse Practitioners, 15(4), 263-267. Retrieved December 22, 2017 from [email or web address]">  Knee Exercises Ask your health care provider which exercises are safe for you. Do exercises exactly as told by your health care provider and adjust them as directed. It is normal to feel mild stretching, pulling, tightness, or discomfort as you do these exercises. Stop right away if you feel sudden pain or your pain gets worse. Do not begin these exercises until told by your health care provider. Stretching and range-of-motion exercises These exercises warm up your muscles and joints and improve the movement and flexibility of your knee. These exercises also help to relieve pain and swelling. Knee extension, prone 1. Lie on your abdomen (prone position) on a bed. 2. Place your left / right knee just beyond the edge of the surface so your knee is not on the bed. You can put a towel under your left / right thigh just above your kneecap for comfort. 3. Relax your leg muscles and allow gravity to straighten your knee (extension). You should feel a stretch behind your left / right knee. 4. Hold this position for __________ seconds. 5. Scoot up so your knee is supported between repetitions. Repeat __________ times. Complete this exercise __________ times a day. Knee flexion, active 1. Lie on your back with both legs straight. If this causes back discomfort, bend your left / right knee so your foot is flat on the floor. 2. Slowly slide your left / right heel back toward your buttocks. Stop when you feel a gentle stretch in the front of your knee or thigh (flexion). 3. Hold this position for __________ seconds. 4. Slowly slide your left / right heel back to the starting position. Repeat __________ times. Complete this exercise __________ times a day.   Quadriceps stretch, prone 1. Lie on your abdomen on a firm surface, such as a bed or padded floor. 2. Bend your left / right knee and hold  your ankle. If you cannot reach your ankle or pant leg, loop a belt around your foot and grab the belt instead. 3. Gently pull your heel toward your buttocks. Your knee should not slide out to the side. You should feel a stretch in the front of your thigh and knee (quadriceps). 4. Hold this position for __________ seconds. Repeat __________ times. Complete this exercise __________ times a day.   Hamstring, supine 1. Lie on your back (supine position). 2. Loop a belt or towel over the ball of your left / right foot. The ball of your foot is on the walking surface, right under your toes. 3. Straighten your left / right knee and slowly pull on the belt to raise your leg until you feel a gentle stretch behind your knee (hamstring). ? Do not let your knee bend while you do this. ? Keep your other leg flat on the floor. 4. Hold this position for __________ seconds. Repeat __________ times. Complete this exercise __________ times a day. Strengthening exercises These exercises build strength and endurance in your knee. Endurance is the ability to use your muscles for a long time, even after they get tired. Quadriceps, isometric This exercise stretches the muscles in front of your thigh (quadriceps) without moving your knee joint (isometric). 1. Lie on your back with your left / right leg extended and your other knee bent. Put a rolled towel or small pillow under your knee if told by your health care provider. 2. Slowly tense the muscles in the front of your   left / right thigh. You should see your kneecap slide up toward your hip or see increased dimpling just above the knee. This motion will push the back of the knee toward the floor. 3. For __________ seconds, hold the muscle as tight as you can without increasing your pain. 4. Relax the muscles slowly and completely. Repeat __________ times. Complete this exercise __________ times a day.   Straight leg raises This exercise stretches the muscles in  front of your thigh (quadriceps) and the muscles that move your hips (hip flexors). 1. Lie on your back with your left / right leg extended and your other knee bent. 2. Tense the muscles in the front of your left / right thigh. You should see your kneecap slide up or see increased dimpling just above the knee. Your thigh may even shake a bit. 3. Keep these muscles tight as you raise your leg 4-6 inches (10-15 cm) off the floor. Do not let your knee bend. 4. Hold this position for __________ seconds. 5. Keep these muscles tense as you lower your leg. 6. Relax your muscles slowly and completely after each repetition. Repeat __________ times. Complete this exercise __________ times a day. Hamstring, isometric 1. Lie on your back on a firm surface. 2. Bend your left / right knee about __________ degrees. 3. Dig your left / right heel into the surface as if you are trying to pull it toward your buttocks. Tighten the muscles in the back of your thighs (hamstring) to "dig" as hard as you can without increasing any pain. 4. Hold this position for __________ seconds. 5. Release the tension gradually and allow your muscles to relax completely for __________ seconds after each repetition. Repeat __________ times. Complete this exercise __________ times a day. Hamstring curls If told by your health care provider, do this exercise while wearing ankle weights. Begin with __________ lb weights. Then increase the weight by 1 lb (0.5 kg) increments. Do not wear ankle weights that are more than __________ lb. 1. Lie on your abdomen with your legs straight. 2. Bend your left / right knee as far as you can without feeling pain. Keep your hips flat against the floor. 3. Hold this position for __________ seconds. 4. Slowly lower your leg to the starting position. Repeat __________ times. Complete this exercise __________ times a day.   Squats This exercise strengthens the muscles in front of your thigh and knee  (quadriceps). 1. Stand in front of a table, with your feet and knees pointing straight ahead. You may rest your hands on the table for balance but not for support. 2. Slowly bend your knees and lower your hips like you are going to sit in a chair. ? Keep your weight over your heels, not over your toes. ? Keep your lower legs upright so they are parallel with the table legs. ? Do not let your hips go lower than your knees. ? Do not bend lower than told by your health care provider. ? If your knee pain increases, do not bend as low. 3. Hold the squat position for __________ seconds. 4. Slowly push with your legs to return to standing. Do not use your hands to pull yourself to standing. Repeat __________ times. Complete this exercise __________ times a day. Wall slides This exercise strengthens the muscles in front of your thigh and knee (quadriceps). 1. Lean your back against a smooth wall or door, and walk your feet out 18-24 inches (46-61 cm) from it. 2.   Place your feet hip-width apart. 3. Slowly slide down the wall or door until your knees bend __________ degrees. Keep your knees over your heels, not over your toes. Keep your knees in line with your hips. 4. Hold this position for __________ seconds. Repeat __________ times. Complete this exercise __________ times a day.   Straight leg raises This exercise strengthens the muscles that rotate the leg at the hip and move it away from your body (hip abductors). 1. Lie on your side with your left / right leg in the top position. Lie so your head, shoulder, knee, and hip line up. You may bend your bottom knee to help you keep your balance. 2. Roll your hips slightly forward so your hips are stacked directly over each other and your left / right knee is facing forward. 3. Leading with your heel, lift your top leg 4-6 inches (10-15 cm). You should feel the muscles in your outer hip lifting. ? Do not let your foot drift forward. ? Do not let your  knee roll toward the ceiling. 4. Hold this position for __________ seconds. 5. Slowly return your leg to the starting position. 6. Let your muscles relax completely after each repetition. Repeat __________ times. Complete this exercise __________ times a day.   Straight leg raises This exercise stretches the muscles that move your hips away from the front of the pelvis (hip extensors). 1. Lie on your abdomen on a firm surface. You can put a pillow under your hips if that is more comfortable. 2. Tense the muscles in your buttocks and lift your left / right leg about 4-6 inches (10-15 cm). Keep your knee straight as you lift your leg. 3. Hold this position for __________ seconds. 4. Slowly lower your leg to the starting position. 5. Let your leg relax completely after each repetition. Repeat __________ times. Complete this exercise __________ times a day. This information is not intended to replace advice given to you by your health care provider. Make sure you discuss any questions you have with your health care provider. Document Revised: 12/23/2017 Document Reviewed: 12/23/2017 [employer] Patient Education  2021 [employer]  

## 2020-07-11 NOTE — Telephone Encounter (Signed)
Patrice/Emily  Dr Estanislado Pandy says patient needs annual PFT and re-establish with me. Says patient has had difficulty finding an appt with me. Indication - dyspnea and scleroderma  15 min slot fine. Pls let me know  Thanks  MR

## 2020-07-13 NOTE — Telephone Encounter (Signed)
I have called the pt and she is scheduled to see MR on 05/26 at 9:15.  Nothing further is needed.

## 2020-07-13 NOTE — Telephone Encounter (Signed)
Is okay to wait till 08/11/2020

## 2020-07-13 NOTE — Telephone Encounter (Signed)
Next available 15 minute slot is 5/27. Ok to wait until then?

## 2020-08-10 ENCOUNTER — Ambulatory Visit: Payer: Medicare Other | Admitting: Internal Medicine

## 2020-08-10 ENCOUNTER — Encounter: Payer: Self-pay | Admitting: Internal Medicine

## 2020-08-10 ENCOUNTER — Other Ambulatory Visit: Payer: Self-pay

## 2020-08-10 VITALS — BP 126/64 | HR 64 | Temp 98.0°F | Ht 64.0 in | Wt 136.0 lb

## 2020-08-10 DIAGNOSIS — M349 Systemic sclerosis, unspecified: Secondary | ICD-10-CM | POA: Diagnosis not present

## 2020-08-10 DIAGNOSIS — R0600 Dyspnea, unspecified: Secondary | ICD-10-CM | POA: Diagnosis not present

## 2020-08-10 DIAGNOSIS — R0689 Other abnormalities of breathing: Secondary | ICD-10-CM

## 2020-08-10 NOTE — Progress Notes (Signed)
PCP [name], MD   HPI  IOV 08/16/2016  Chief Complaint  Patient presents with  . Advice Only    Referred by Dr. Estanislado Pandy for scleroderma.  c/o worsening sob, chest tightness with exertion X1 month.    HPI 70 year old woman with history of scleroderma on hydroxychloroquine, CAD s/p stent 11/2015, uterine cancer dx in 2008 s/p hysterectomy, chemo/radtx presenting for evaluation of dyspnea on exertion.  She is followed by Dr. Patrecia Pour for rheumatology, Dr. Haroldine Laws for cardiology. Previously was followed by Dr. Gwynneth Aliment in [place]. Last saw Dr. Estanislado Pandy 06/19/2016. She has had scleroderma for 10 years and only has been on hydroxychloroquine. She moved to [facility] in January 2018.  For the past month, she has dyspnea with going up one flight of stairs most times. This is new. Also sometimes gets short of breath with bending over to pick something up. Denies cough. She does yoga, strength training, and walking for exercise. She can walk a couple of miles without any issues. She is independent in all her activities of daily living. Does not use any devices to aid in walking. Denies GERD. Has not had a pulmonologist.  She had shortness of breath that prompted the cardiac evaluation leading to stent placement. She has an appointment with cardiology June 12. Review of Systems  S: See resident physician for details. She has scleroderma for over 10 years for which she is on Actonel. Denies any associated acid reflux. Started on was believed only to involve the skin. I personally evaluated the history that this patient had a few months of shortness of breath in 2017 between summer and fall that then resulted in worsening dyspnea on exertion. That then resulted in a cardiac stent. After this dyspnea resolved. Then subsequently in January 2018 moved from the [place] area to [place], [place]. Now for the last 1 month she's having recurrent dyspnea on exertion. She feels this is  from the heart. She does not think is a lung issue. Relieved by rest. She notices it for climbing stairs relieved by rest. She has cardiology appointment pending. There is a pulmonary function test and echocardiogram pending on 08/27/2016. She is reluctant to get a CT chest. Walking desat test in office 185 feet x  3 laps on RA: 100% at rest and exertion  Recent pertinent labs  Results for DOMANIQUE, Brewer (MRN [id number]) as of 08/16/2016 10:29  Ref. Range 06/19/2016 10:38 06/26/2016 10:04  Creatinine Latest Ref Range: 0.50 - 0.99 mg/dL 0.79   Results for SHABREKA, COULON (MRN [id number]) as of 08/16/2016 10:29  Ref. Range 06/19/2016 10:38 06/26/2016 10:04  Hemoglobin Latest Ref Range: 11.7 - 15.5 g/dL 14.8      has a past medical history of Cancer ([place]).   reports that she has never smoked. She has never used smokeless tobacco.   OV 11/05/2016  Chief Complaint  Patient presents with  . Follow-up    Pt here after CT and PFT. Pt denies change in SOB since last OV. Pt denies cough, CP/tightness, f/c/s.     Follow-up scleroderma with associated shortness of breath   last seen in June 2018. At that time he thought the suspicion for interstitial lung disease was low. She had Pulm  function test that showed isolated reduction in diffusion capacity to 63%. This raises the possibility of interstitial lung disease but she did have a high-resolution CT scan of the chest that is documented below. Interstitial lung disease has been ruled out.  This no pulmonary parenchymal abnormality. Her hemoglobin was 14.8 g percent suggesting no anemia causing shortness of breath. She then followed up with cardiology. According to the echocardiogram she does not have pulmonary hypertension which can be seen and started on the patient's. She was on  BRILINTA for CAD - this got changed to Plavix and her dyspnea resolved. Currently she is doing well does not have any rest symptoms.   Results for ALEYSIA, OLTMANN (MRN [id number]) as  of 11/05/2016 11:24  Ref. Range 08/27/2016 11:19  FVC-Pre Latest Units: L 2.86  FVC-%Pred-Pre Latest Units: % 91  FEV1-Pre Latest Units: L 2.11  FEV1-%Pred-Pre Latest Units: % 88  Pre FEV1/FVC ratio Latest Units: % 74    Results for KUMIKO, FISHMAN (MRN [id number]) as of 11/05/2016 11:24  Ref. Range 08/27/2016 11:19  TLC Latest Units: L 4.64  TLC % pred Latest Units: % 91  Results for KAYLIA, WINBORNE (MRN [id number]) as of 11/05/2016 11:24  Ref. Range 08/27/2016 11:19  DLCO unc Latest Units: ml/min/mmHg 15.42  DLCO unc % pred Latest Units: % 63   IMPRESSION: 1. No evidence of interstitial lung disease. No acute pulmonary disease . 2. Solitary 3 mm solid apical right upper lobe pulmonary nodule. No follow-up needed if patient is low-risk. Non-contrast chest CT can be considered in 12 months if patient is high-risk. This recommendation follows the consensus statement: Guidelines for Management of Incidental Pulmonary Nodules Detected on CT Images: From the Fleischner Society 2017; Radiology 2017; 284:228-243. 3. Small pericardial effusion/thickening. 4. Three-vessel coronary atherosclerosis.  Aortic Atherosclerosis (ICD10-I70.0).   Electronically Signed   By: Ilona Sorrel M.D.   On: 09/03/2016 15:18  OV 12/16/2017  Subjective:  Patient ID: Rhonda Brewer, female , DOB: 1950/11/20 , age 70 y.o. , MRN: [id number] , ADDRESS: [address] [place] [address]   12/16/2017 -   Chief Complaint  Patient presents with  . Follow-up    Pt is here for a 1 year follow up and states she has been doing well. Pt denies any complaints.     HPI Rhonda Brewer 70 y.o. -1 year follow-up for ILD monitoring in the setting of scleroderma.  She says overall she is stable.  She is no longer on Plaquenil for scleroderma.  She continues to have Raynaud.  She tells me other than extremely mild shortness of breath IV exertion she is stable.  There is no interim medical issues or surgical issues.  No  change in medications of significance.  No ER visits no hospitalizations.  Only interim medical issues carpal tunnel surgery.  She had a CT scan of the chest July 2019.  This is not a high-resolution CT chest.  On the report there is no evidence of ILD.  Last echocardiogram June 2018 without any pulmonary hypertension.  There is no wheezing cough orthopnea proximal nocturnal dyspnea.    OV 08/10/2020  Subjective:  Patient ID: Rhonda Brewer, female , DOB: 29-Sep-1950 , age 25 y.o. , MRN: [id number] , ADDRESS: [address] [facility] [address] PCP [name], MD Patient Care Team: [name], MD as PCP - General (Family Medicine) Bensimhon, Shaune Pascal, MD as PCP - Advanced Heart Failure (Cardiology) Bo Merino, MD as Consulting Physician (Rheumatology) Bensimhon, Shaune Pascal, MD as Consulting Physician (Cardiology) Mauri Pole, MD as Consulting Physician (Gastroenterology) Brand Males, MD as Consulting Physician (Pulmonary Disease) Warden Fillers, MD as Consulting Physician (Ophthalmology) Royston Sinner Colin Benton, MD as Consulting Physician (Obstetrics and Gynecology)  This Provider  for this visit: Treatment Team:  Attending Provider: Brand Males, MD    08/10/2020 -   Chief Complaint  Patient presents with  . Follow-up    Pt states she has been doing okay since last visit. Denies any issues with her breathing.   Follow-up scleroderma with very mild shortness of breath.  At risk for ILD.  - 2019 CT without ILD -2020 pulmonary function test with improvement/normal  HPI Quinisha Mould 70 y.o. -returns for follow-up.  I personally saw her in 2019.  Then after that because of the pandemic I did not see her.  She did see nurse practitioner December 2020.  Pulmonary function test was normal//slightly improved/stable.  She is on expectant follow-up with monitoring.  In April 2022 she is a Dr. D her rheumatologist.  She asked to be established back in  pulmonary to see me.  Patient has continued ongoing issues with sclerodactyly and also Raynaud's.  In January 2022 she had echocardiogram that I visualized the result.  It shows valvular regurgitation is worse but no evidence of pulmonary hypertension.  She has very mild shortness of breath if at all for climbing stairs.  Symptom scores are not detailed below.  Walking desaturation test is stable compared to 3 years ago and normal.  SYMPTOM SCALE  08/10/2020   O2 use ra  Shortness of Breath 0 -> 5 scale with 5 being worst (score 6 If unable to do)  At rest 0  Simple tasks - showers, clothes change, eating, shaving 0  Household (dishes, doing bed, laundry) 000  Shopping 0  Walking level at own pace 0  Walking up Stairs 0  Total (30-36) Dyspnea Score 00  How bad is your cough? 0  How bad is your fatigue 0  How bad is nausea 00  How bad is vomiting?  0  How bad is diarrhea? 0  How bad is anxiety? 0  How bad is depression 0       CT Chest data  No results found.   Simple office walk 185 feet x  3 laps goal with forehead probe 12/16/2017  08/10/2020   O2 used Room air ra  Number laps completed 3 3  Comments about pace Normal brisk avg space  Resting Pulse Ox/HR 99% and 65/min 98% and HR 67  Final Pulse Ox/HR 99% and 74/min 98% and HR 82  Desaturated </= 88% no no  Desaturated <= 3% points no no  Got Tachycardic >/= 90/min no no  Symptoms at end of test non3 No complaints  Miscellaneous comments Normal test      PFT  PFT Results Latest Ref Rng & Units 03/03/2019 08/27/2016  FVC-Pre L 2.90 2.86  FVC-Predicted Pre % 94 91  FVC-Post L - 2.98  FVC-Predicted Post % - 94  Pre FEV1/FVC % % 80 74  Post FEV1/FCV % % - 82  FEV1-Pre L 2.32 2.11  FEV1-Predicted Pre % 99 88  FEV1-Post L - 2.45  DLCO uncorrected ml/min/mmHg 16.78 15.42  DLCO UNC% % 85 63  DLVA Predicted % 93 70  TLC L - 4.64  TLC % Predicted % - 91  RV % Predicted % - 80       has a past medical history  of Allergy, Arthritis, CAD (coronary artery disease), Cancer ([place]), CHF (congestive heart failure) ([place]), Heart murmur, Hyperlipidemia, Osteoporosis, and Thyroid disease.   reports that she has never smoked. She has never used smokeless tobacco.  Past Surgical History:  Procedure  Laterality Date  . ABDOMINAL HYSTERECTOMY    . APPENDECTOMY    . CARDIAC ELECTROPHYSIOLOGY MAPPING AND ABLATION  04/19/2003  . CARPAL TUNNEL RELEASE Right 10/15/2017  . CORONARY ANGIOPLASTY WITH STENT PLACEMENT    . RIGHT/LEFT HEART CATH AND CORONARY ANGIOGRAPHY [name] 08/14/2017   Procedure: RIGHT/LEFT HEART CATH AND CORONARY ANGIOGRAPHY;  Surgeon: Jolaine Artist, MD;  Location: [facility];  Service: Cardiovascular;  Laterality: [name];  . ULTRASOUND GUIDANCE FOR VASCULAR ACCESS  08/14/2017   Procedure: Ultrasound Guidance For Vascular Access;  Surgeon: Jolaine Artist, MD;  Location: [facility];  Service: Cardiovascular;;    Allergies  Allergen Reactions  . Avelox [Moxifloxacin Hcl In Nacl] Other (See Comments)    dizziness  . Other Other (See Comments)    SEASONAL     Immunization History  Administered Date(s) Administered  . Fluad Quad(high Dose 65+) 11/29/2019  . Influenza Split 01/17/2016  . Influenza,inj,Quad PF,6+ Mos 11/12/2018  . Influenza-Unspecified 04/02/2017, 12/08/2017  . Moderna SARS-COV2 Booster Vaccination 01/13/2020  . Moderna Sars-Covid-2 Vaccination 04/09/2019, 05/14/2019  . Pneumococcal Conjugate-13 10/26/2014  . Pneumococcal Polysaccharide-23 11/12/2018  . Pneumococcal-Unspecified 09/14/2013  . Td 07/18/2011    Family History  Problem Relation Age of Onset  . Asthma Maternal Aunt   . Heart disease Mother   . Diabetes Mother   . Heart disease Father   . Stomach cancer Brother   . Diabetes Brother   . Colon cancer Neg Hx   . Esophageal cancer Neg Hx   . Rectal cancer Neg Hx      Current Outpatient Medications:  .  albuterol (PROVENTIL HFA;VENTOLIN  HFA) 108 (90 Base) MCG/ACT inhaler, INHALE 2 PUFFS INTO THE LUNGS EVERY 4 HOURS AS NEEDED FOR WHEEZING OR SHORTNESS OF BREATH, Disp: 90 g, Rfl: 6 .  aspirin EC 81 MG tablet, Take 81 mg by mouth daily. , Disp: , Rfl:  .  atorvastatin (LIPITOR) 80 MG tablet, Take 1 tablet (80 mg total) by mouth daily., Disp: 90 tablet, Rfl: 3 .  Calcium Carbonate-Vitamin D (CALCIUM-VITAMIN D3 PO), Take 1 tablet by mouth daily., Disp: , Rfl:  .  cycloSPORINE (RESTASIS) 0.05 % ophthalmic emulsion, Place 1 drop into both eyes 2 (two) times daily., Disp: , Rfl:  .  levothyroxine (SYNTHROID) 75 MCG tablet, TAKE 1 TABLET BY MOUTH  DAILY BEFORE BREAKFAST, Disp: 90 tablet, Rfl: 3 .  Multiple Vitamin (MULTI-VITAMINS) TABS, Take 1 tablet by mouth daily. , Disp: , Rfl:  .  pantoprazole (PROTONIX) 40 MG tablet, Take 1 tablet (40 mg total) by mouth 2 (two) times daily before a meal., Disp: 180 tablet, Rfl: 3 .  vitamin B-12 (CYANOCOBALAMIN) 100 MCG tablet, Take 100 mcg by mouth daily., Disp: , Rfl:       Objective:   Vitals:   08/10/20 0905  BP: 126/64  Pulse: 64  Temp: 98 F (36.7 C)  TempSrc: Temporal  SpO2: 98%  Weight: 136 lb (61.7 kg)  Height: 5\' 4"  (1.626 m)    Estimated body mass index is 23.34 kg/m as calculated from the following:   Height as of this encounter: 5\' 4"  (1.626 m).   Weight as of this encounter: 136 lb (61.7 kg).  @WEIGHTCHANGE @  [employer]   08/10/20 0905  Weight: 136 lb (61.7 kg)     Physical Exam   General: No distress. Looks well Neuro: Alert and Oriented x 3. GCS 15. Speech normal Psych: Pleasant Resp:  Barrel Chest - no.  Wheeze - no, Crackles - no, No overt respiratory distress CVS: Normal heart sounds. Murmurs - no Ext: Stigmata of Connective Tissue Disease -sclerodactyly HEENT: Normal upper airway. PEERL +. No post nasal drip        Assessment:       ICD-10-CM   1. Dyspnea and respiratory abnormalities  R06.00    R06.89   2. Scleroderma ([place])  M34.9         Plan:     Patient Instructions     ICD-10-CM   1. Dyspnea and respiratory abnormalities  R06.00    R06.89   2. Scleroderma ([place])  M34.9      Very mild shortness of breath for staors that is baseline  No evidence of ILD disease from scleroderma based on July 2019 CT, PFT Dec 2020 and exam/walk test 08/10/2020  No evidence of pulmonary hypertension on Echo Jan 2022 (though there is leaky valve worsening)   Plan - do ILD symptom score sheet 08/10/2020 - do spirometry and dlco in dec 2022  followup - dec 2022 with Dr Chase Caller 15 min slot but after PFT  - symptom socre and walk test at this visit; if continuing to do well then reset to once a year followup   (Level 04: Estb 30-39 min   visit type: on-site physical face to visit visit spent in total care time and counseling or/and coordination of care by this undersigned MD - Dr Brand Males. This includes one or more of the following on this same day 08/10/2020: pre-charting, chart review, note writing, documentation discussion of test results, diagnostic or treatment recommendations, prognosis, risks and benefits of management options, instructions, education, compliance or risk-factor reduction. It excludes time spent by the [place] or office staff in the care of the patient . Actual time is 30 min)   SIGNATURE    Dr. Brand Males, M.D., F.C.C.P,  Pulmonary and Critical Care Medicine Staff Physician, [place] Director - Interstitial Lung Disease  Program  Pulmonary [place] at [place], [place], [address]  Pager: [phone], If no answer or between  15:00h - 7:00h: call [phone] Telephone: [phone]  9:38 AM 08/10/2020

## 2020-08-10 NOTE — Patient Instructions (Addendum)
ICD-10-CM   1. Dyspnea and respiratory abnormalities  R06.00    R06.89   2. Scleroderma ([place])  M34.9      Very mild shortness of breath for staors that is baseline  No evidence of ILD disease from scleroderma based on July 2019 CT, PFT Dec 2020 and exam/walk test 08/10/2020  No evidence of pulmonary hypertension on Echo Jan 2022 (though there is leaky valve worsening)   Plan - do ILD symptom score sheet 08/10/2020 - do spirometry and dlco in dec 2022  followup - dec 2022 with Dr Chase Caller 15 min slot but after PFT  - symptom socre and walk test at this visit; if continuing to do well then reset to once a year followup

## 2020-09-05 ENCOUNTER — Encounter: Payer: Self-pay | Admitting: Family Medicine

## 2020-09-11 DIAGNOSIS — R350 Frequency of micturition: Secondary | ICD-10-CM | POA: Diagnosis not present

## 2020-09-11 DIAGNOSIS — R8290 Unspecified abnormal findings in urine: Secondary | ICD-10-CM | POA: Diagnosis not present

## 2020-09-11 DIAGNOSIS — M952 Other acquired deformity of head: Secondary | ICD-10-CM | POA: Diagnosis not present

## 2020-09-11 DIAGNOSIS — L293 Anogenital pruritus, unspecified: Secondary | ICD-10-CM | POA: Diagnosis not present

## 2020-09-13 DIAGNOSIS — A6000 Herpesviral infection of urogenital system, unspecified: Secondary | ICD-10-CM | POA: Insufficient documentation

## 2020-10-05 ENCOUNTER — Other Ambulatory Visit: Payer: Self-pay | Admitting: Family Medicine

## 2020-10-05 DIAGNOSIS — I2510 Atherosclerotic heart disease of native coronary artery without angina pectoris: Secondary | ICD-10-CM

## 2020-10-11 ENCOUNTER — Encounter: Payer: Self-pay | Admitting: Family Medicine

## 2020-10-11 DIAGNOSIS — I2510 Atherosclerotic heart disease of native coronary artery without angina pectoris: Secondary | ICD-10-CM

## 2020-10-11 DIAGNOSIS — Z8639 Personal history of other endocrine, nutritional and metabolic disease: Secondary | ICD-10-CM

## 2020-10-30 DIAGNOSIS — H93293 Other abnormal auditory perceptions, bilateral: Secondary | ICD-10-CM | POA: Diagnosis not present

## 2020-11-16 ENCOUNTER — Other Ambulatory Visit: Payer: Self-pay | Admitting: Physician Assistant

## 2020-12-03 ENCOUNTER — Other Ambulatory Visit: Payer: Self-pay | Admitting: Family Medicine

## 2020-12-03 DIAGNOSIS — Z79899 Other long term (current) drug therapy: Secondary | ICD-10-CM

## 2020-12-03 DIAGNOSIS — Z8639 Personal history of other endocrine, nutritional and metabolic disease: Secondary | ICD-10-CM

## 2020-12-03 DIAGNOSIS — I2510 Atherosclerotic heart disease of native coronary artery without angina pectoris: Secondary | ICD-10-CM

## 2020-12-05 ENCOUNTER — Other Ambulatory Visit: Payer: Self-pay

## 2020-12-05 ENCOUNTER — Other Ambulatory Visit: Payer: Medicare Other

## 2020-12-05 DIAGNOSIS — I2510 Atherosclerotic heart disease of native coronary artery without angina pectoris: Secondary | ICD-10-CM

## 2020-12-05 DIAGNOSIS — Z8639 Personal history of other endocrine, nutritional and metabolic disease: Secondary | ICD-10-CM | POA: Diagnosis not present

## 2020-12-05 DIAGNOSIS — Z79899 Other long term (current) drug therapy: Secondary | ICD-10-CM

## 2020-12-06 DIAGNOSIS — D2371 Other benign neoplasm of skin of right lower limb, including hip: Secondary | ICD-10-CM | POA: Diagnosis not present

## 2020-12-06 DIAGNOSIS — L819 Disorder of pigmentation, unspecified: Secondary | ICD-10-CM | POA: Diagnosis not present

## 2020-12-06 DIAGNOSIS — L821 Other seborrheic keratosis: Secondary | ICD-10-CM | POA: Diagnosis not present

## 2020-12-06 LAB — CMP14+EGFR
ALT: 13 IU/L (ref 0–32)
AST: 20 IU/L (ref 0–40)
Albumin/Globulin Ratio: 1.8 (ref 1.2–2.2)
Albumin: 4.2 g/dL (ref 3.8–4.8)
Alkaline Phosphatase: 83 IU/L (ref 44–121)
BUN/Creatinine Ratio: 19 (ref 12–28)
BUN: 13 mg/dL (ref 8–27)
Bilirubin Total: 0.5 mg/dL (ref 0.0–1.2)
CO2: 21 mmol/L (ref 20–29)
Calcium: 9.5 mg/dL (ref 8.7–10.3)
Chloride: 107 mmol/L — ABNORMAL HIGH (ref 96–106)
Creatinine, Ser: 0.69 mg/dL (ref 0.57–1.00)
Globulin, Total: 2.3 g/dL (ref 1.5–4.5)
Glucose: 95 mg/dL (ref 65–99)
Potassium: 4.4 mmol/L (ref 3.5–5.2)
Sodium: 143 mmol/L (ref 134–144)
Total Protein: 6.5 g/dL (ref 6.0–8.5)
eGFR: 94 mL/min/{1.73_m2} (ref >59)

## 2020-12-06 LAB — LIPID PANEL
Chol/HDL Ratio: 2.3 ratio (ref 0.0–4.4)
Cholesterol, Total: 164 mg/dL (ref 100–199)
HDL: 72 mg/dL (ref >39)
LDL Chol Calc (NIH): 76 mg/dL (ref 0–99)
Triglycerides: 84 mg/dL (ref 0–149)
VLDL Cholesterol Cal: 16 mg/dL (ref 5–40)

## 2020-12-06 LAB — TSH: TSH: 3.33 u[IU]/mL (ref 0.450–4.500)

## 2020-12-07 ENCOUNTER — Ambulatory Visit (INDEPENDENT_AMBULATORY_CARE_PROVIDER_SITE_OTHER): Payer: Medicare Other | Admitting: Family Medicine

## 2020-12-07 ENCOUNTER — Other Ambulatory Visit: Payer: Self-pay

## 2020-12-07 ENCOUNTER — Encounter: Payer: Self-pay | Admitting: Family Medicine

## 2020-12-07 VITALS — BP 112/62 | Ht 64.0 in | Wt 139.0 lb

## 2020-12-07 DIAGNOSIS — Z0000 Encounter for general adult medical examination without abnormal findings: Secondary | ICD-10-CM | POA: Diagnosis not present

## 2020-12-07 DIAGNOSIS — Z833 Family history of diabetes mellitus: Secondary | ICD-10-CM

## 2020-12-07 DIAGNOSIS — M349 Systemic sclerosis, unspecified: Secondary | ICD-10-CM

## 2020-12-07 DIAGNOSIS — Z8639 Personal history of other endocrine, nutritional and metabolic disease: Secondary | ICD-10-CM | POA: Diagnosis not present

## 2020-12-07 DIAGNOSIS — I2510 Atherosclerotic heart disease of native coronary artery without angina pectoris: Secondary | ICD-10-CM | POA: Diagnosis not present

## 2020-12-07 DIAGNOSIS — Z23 Encounter for immunization: Secondary | ICD-10-CM

## 2020-12-07 DIAGNOSIS — M654 Radial styloid tenosynovitis [de Quervain]: Secondary | ICD-10-CM | POA: Diagnosis not present

## 2020-12-07 DIAGNOSIS — Z79899 Other long term (current) drug therapy: Secondary | ICD-10-CM

## 2020-12-07 LAB — POCT GLYCOSYLATED HEMOGLOBIN (HGB A1C): Hemoglobin A1C: 5.7 % — AB (ref 4.0–5.6)

## 2020-12-07 NOTE — Patient Instructions (Addendum)
You got the flu shot today.  You still need the new covid booster and the shingles vaccine.  Remember, the shingles vaccines are the ones most likely to cause side effects.  You have De Quervain's tenosynovitis, left.  Someone should call about the [facility] referral.  I assume they will want to do an injection.

## 2020-12-11 ENCOUNTER — Ambulatory Visit (INDEPENDENT_AMBULATORY_CARE_PROVIDER_SITE_OTHER): Payer: Medicare Other | Admitting: Family Medicine

## 2020-12-11 ENCOUNTER — Encounter: Payer: Self-pay | Admitting: Family Medicine

## 2020-12-11 DIAGNOSIS — M654 Radial styloid tenosynovitis [de Quervain]: Secondary | ICD-10-CM

## 2020-12-11 NOTE — Assessment & Plan Note (Signed)
Checked A1C and it is normal

## 2020-12-11 NOTE — Assessment & Plan Note (Signed)
TSH normal on replacement therapy

## 2020-12-11 NOTE — Assessment & Plan Note (Signed)
Exam consistent with De quervain's. Negative provocative testing for carpal tunnel. No tenderness at [facility] joint that would indicate arthritic pain.  Discussed conservative vs more invasive options including icing, thumb spica brace & applying thumb spica brace vs steroid injection.  Patient would like to move forward with conservative measures for now and consider injection if pain does not improve with conservative measures.  Also counseled on importance of modifying yoga maneuvers in order to avoid causing pain  Patient will follow up in 5-6 weeks and consider injection if no improvement.

## 2020-12-11 NOTE — Progress Notes (Signed)
   PCP: [name], MD  Subjective:   HPI: Patient is a 70 y.o. female here for left wrist pain concerning for de Quervain's synovitis.  Patient reports that she has been having left thumb and wrist pain for 2 months.  She reports pain is worse with lifting using her left hand.  Patient reports that she is right-hand dominant.  She reports that she has pain especially at night if she moves in certain ways that apply pressure to the radial aspect of her left wrist.  Patient reports that she recently had a physical with her PCP and was told that she had de Quervain's.  She states that she has been wearing a wrist brace that she previously used for carpal tunnel which does help her to sleep.  She states that she attempts to avoid oral NSAIDs due to concern for GI symptoms.  She states that she has Voltaren gel at home to use topically.  She denies any skin changes.  Patient also reports history of arthritis as well as injections for trigger finger in the past.       Objective:  Physical Exam:  Gen: awake, alert, NAD, comfortable in exam room Pulm: breathing unlabored  Hand & Wrist: Inspection: No obvious deformity. No swelling, erythema or bruising Palpation:  TTP on the radial aspect of left hand and wrist, no tenderness to palpation of CMC joint or over carpal tunnel. ROM: Full ROM of the digits and wrist Strength: 5/5 strength in the forearm, wrist and interosseus muscles Neurovascular: NV intact Special tests: + finkelstein's, negative tinel's at the carpal tunnel, negative Phalen's    Assessment & Plan:    De Quervain's tenosynovitis, left Exam consistent with De quervain's. Negative provocative testing for carpal tunnel. No tenderness at [facility] joint that would indicate arthritic pain.  Discussed conservative vs more invasive options including icing, thumb spica brace & applying thumb spica brace vs steroid injection.  Patient would like to move forward with conservative  measures for now and consider injection if pain does not improve with conservative measures.  Also counseled on importance of modifying yoga maneuvers in order to avoid causing pain  Patient will follow up in 5-6 weeks and consider injection if no improvement.     Eulis Foster, MD [place], PGY-3 12/11/2020 12:12 PM

## 2020-12-11 NOTE — Progress Notes (Signed)
    SUBJECTIVE:   CHIEF COMPLAINT / HPI:   Annual exam:  Feels great.  She has a healthy lifestyle and weight.  Very active. No at risk behaviors.  Acute problem.  Left wrist pain on the thumb side.  Present x 3-4 weeks.  No trauma. 2.  Chronic problems CAD secondary prevention.  No chest pain. Scleroderma.  Followed by rheum. Hx of hypothyroid.  Labs done prior to visit. Does have FHX of   HPDP: Mostly immunizations Will get flu shot today Will get new bivalent COVID booster when available. Educated about Shingrix  PERTINENT  PMH / PSH: Denies CP, DOE, bleeding, abd pain, worrisome skin lesions, focal weakness.  Denies change in weight, bowel, bladder or appetite.  OBJECTIVE:   BP 112/62   Ht 5\' 4"  (1.626 m)   Wt 139 lb (63 kg)   SpO2 100%   BMI 23.86 kg/m   HEENT WNL Neck supple Lungs clear Cardiac RRR without m or g Abd benign Ext no edema.  Pain on left thumb extensors around snuffbox Neuro, Motor, sensory, gait, mentation and affect  ASSESSMENT/PLAN:   Preventative health care Healthy woman with no at risk behaviors.  Discussed plan to bring her up to date on HPDP immunizations.  Family history of diabetes mellitus (DM) Checked A1C and it is normal  De Quervain's tenosynovitis, left Refer to [facility]  History of hypothyroidism TSH normal on replacement therapy  CAD (coronary artery disease), native coronary artery On high dose lipitor and ASA.  LDL not quite at goal.  High risk medication use CBC and CMP OK  Scleroderma ([facility]) Followed by rheum.       [name], MD [place]

## 2020-12-11 NOTE — Assessment & Plan Note (Signed)
CBC and CMP OK

## 2020-12-11 NOTE — Assessment & Plan Note (Signed)
Followed by rheum. 

## 2020-12-11 NOTE — Patient Instructions (Signed)
You have deQuervain's tenosynovitis of your thumb/wrist. Avoid painful activities as much as possible. Wear the thumb spica brace as often as possible to rest this. Ice 15 minutes at a time 3-4 times a day. Use voltaren gel up to 4 times a day for pain and inflammation. If not improving would recommend cortisone injection (oral anti-inflammatory is an option but concern would be with your stomach issues). Follow up with me in 5-6 weeks for reevaluation but call me sooner if you're struggling.

## 2020-12-11 NOTE — Assessment & Plan Note (Signed)
Refer to [facility]

## 2020-12-11 NOTE — Assessment & Plan Note (Signed)
On high dose lipitor and ASA.  LDL not quite at goal.

## 2020-12-11 NOTE — Assessment & Plan Note (Signed)
Healthy woman with no at risk behaviors.  Discussed plan to bring her up to date on HPDP immunizations.

## 2020-12-23 ENCOUNTER — Ambulatory Visit (HOSPITAL_COMMUNITY)
Admission: EM | Admit: 2020-12-23 | Discharge: 2020-12-23 | Disposition: A | Discharge status: Home / Self Care | Payer: Medicare Other

## 2020-12-23 ENCOUNTER — Other Ambulatory Visit: Payer: Self-pay

## 2020-12-23 DIAGNOSIS — Z20822 Contact with and (suspected) exposure to covid-19: Secondary | ICD-10-CM | POA: Diagnosis not present

## 2020-12-25 ENCOUNTER — Ambulatory Visit (INDEPENDENT_AMBULATORY_CARE_PROVIDER_SITE_OTHER): Payer: Medicare Other | Admitting: Family Medicine

## 2020-12-25 VITALS — Ht 64.0 in | Wt 138.0 lb

## 2020-12-25 DIAGNOSIS — M654 Radial styloid tenosynovitis [de Quervain]: Secondary | ICD-10-CM | POA: Diagnosis not present

## 2020-12-25 MED ORDER — METHYLPREDNISOLONE ACETATE 40 MG/ML IJ SUSP
20.0000 mg | Freq: Once | INTRAMUSCULAR | Status: AC
  Administered 2020-12-25: 20 mg via INTRA_ARTICULAR

## 2020-12-25 MED ORDER — METHYLPREDNISOLONE ACETATE 40 MG/ML IJ SUSP: 40.0000 mg | Freq: Once | INTRAMUSCULAR | Status: DC

## 2020-12-25 NOTE — Progress Notes (Signed)
   PCP: [name], MD  Subjective:   HPI: Patient is a 70 y.o. female here for left wrist pain concerning for de Quervain's synovitis.  9/26: Patient reports that she has been having left thumb and wrist pain for 2 months.  She reports pain is worse with lifting using her left hand.  Patient reports that she is right-hand dominant.  She reports that she has pain especially at night if she moves in certain ways that apply pressure to the radial aspect of her left wrist.  Patient reports that she recently had a physical with her PCP and was told that she had de Quervain's.  She states that she has been wearing a wrist brace that she previously used for carpal tunnel which does help her to sleep.  She states that she attempts to avoid oral NSAIDs due to concern for GI symptoms.  She states that she has Voltaren gel at home to use topically.  She denies any skin changes.  Patient also reports history of arthritis as well as injections for trigger finger in the past.  10/10: Patient returns today for injection for dequervain's tenosynovitis.     Objective:  Physical Exam:  Gen: NAD, comfortable in exam room  Left wrist: No deformity. FROM with 5/5 strength - pain with thumb motions. Tenderness to palpation 1st dorsal compartment. NVI distally. Negative tinels carpal tunnel.  Positive finkelstein.   Assessment & Plan:  Left dequervain's tenosynovitis - Continue with thumb spica brace at least for the next week.  Injection given today.  F/u in 1 month if not improving.  After informed written consent timeout was performed.  Patient was seated in chair in exam room.  Area overlying left 1st dorsal compartment prepped with alcohol swab.  Then utilizing ultrasound guidance patient's left 1st dorsal compartment was injected with 0.5:0.86mL lidocaine:depomedrol (20mg ).  Patient tolerated procedure well without immediate complications.

## 2020-12-29 NOTE — Progress Notes (Signed)
Office Visit Note  Patient: Rhonda Brewer             Date of Birth: 1950/08/18           MRN: [id number]             PCP: [name], MD Referring: [name], MD Visit Date: 01/11/2021 Occupation: @GUAROCC @  Subjective:  Joint stiffness.   History of Present Illness: Rhonda Brewer is a 70 y.o. female with a history of limited systemic sclerosis.  She states she continues to have some stiffness in her hands due to sclerodactyly.  She has not noticed any joint swelling.  Her Raynaud's symptoms are more active during the colder weather.  She has not noticed any digital ulcers.  She continues to have some reflux symptoms she denies any constipation or bloating.  There is no history of shortness of breath or palpitations.  Activities of Daily Living:  Patient reports morning stiffness for 5 minutes.   Patient Denies nocturnal pain.  Difficulty dressing/grooming: Denies Difficulty climbing stairs: Denies Difficulty getting out of chair: Denies Difficulty using hands for taps, buttons, cutlery, and/or writing: Denies  Review of Systems  Constitutional:  Negative for fatigue, night sweats, weight gain and weight loss.  HENT:  Positive for mouth dryness. Negative for mouth sores, trouble swallowing, trouble swallowing and nose dryness.   Eyes:  Positive for dryness. Negative for pain, redness and visual disturbance.  Respiratory:  Negative for cough, shortness of breath and difficulty breathing.   Cardiovascular:  Negative for chest pain, palpitations, hypertension, irregular heartbeat and swelling in legs/feet.  Gastrointestinal:  Negative for blood in stool, constipation and diarrhea.  Endocrine: Negative for increased urination.  Genitourinary:  Negative for vaginal dryness.  Musculoskeletal:  Positive for morning stiffness. Negative for joint pain, joint pain, joint swelling, myalgias, muscle weakness, muscle tenderness and myalgias.  Skin:  Positive for color change  and skin tightness. Negative for rash, hair loss, ulcers and sensitivity to sunlight.  Allergic/Immunologic: Negative for susceptible to infections.  Neurological:  Negative for dizziness, memory loss, night sweats and weakness.  Hematological:  Negative for swollen glands.  Psychiatric/Behavioral:  Negative for depressed mood and sleep disturbance. The patient is not nervous/anxious.    PMFS History:  Patient Active Problem List   Diagnosis Date Noted   Family history of diabetes mellitus (DM) 12/07/2020   De Quervain's tenosynovitis, left 12/07/2020   Preventative health care 12/01/2019   Constipation 11/12/2018   Carpal tunnel syndrome 05/29/2017   Low back pain without sciatica 04/29/2017   Screening for breast cancer 09/04/2016   H/O cardiac radiofrequency ablation 06/26/2016   CAD (coronary artery disease), native coronary artery 06/19/2016   History of hypothyroidism 06/19/2016   History of uterine cancer 06/19/2016   Scleroderma ([place]) 06/16/2016   High risk medication use 06/16/2016   Osteopenia of multiple sites 06/16/2016    Past Medical History:  Diagnosis Date   Allergy    Arthritis    CAD (coronary artery disease)    a. DES to RCA 11/2015   Cancer [facility])    Uterine 2008   CHF (congestive heart failure) (HCC)    Heart murmur    Hyperlipidemia    Osteoporosis    Thyroid disease     Family History  Problem Relation Age of Onset   Asthma Maternal Aunt    Heart disease Mother    Diabetes Mother    Heart disease Father    Stomach cancer Brother  Diabetes Brother    Colon cancer Neg Hx    Esophageal cancer Neg Hx    Rectal cancer Neg Hx    Past Surgical History:  Procedure Laterality Date   ABDOMINAL HYSTERECTOMY     APPENDECTOMY     CARDIAC ELECTROPHYSIOLOGY MAPPING AND ABLATION  04/19/2003   CARPAL TUNNEL RELEASE Right 10/15/2017   CORONARY ANGIOPLASTY WITH STENT PLACEMENT     RIGHT/LEFT HEART CATH AND CORONARY ANGIOGRAPHY [name] 08/14/2017   Procedure:  RIGHT/LEFT HEART CATH AND CORONARY ANGIOGRAPHY;  Surgeon: Jolaine Artist, MD;  Location: [facility];  Service: Cardiovascular;  Laterality: [name];   ULTRASOUND GUIDANCE FOR VASCULAR ACCESS  08/14/2017   Procedure: Ultrasound Guidance For Vascular Access;  Surgeon: Jolaine Artist, MD;  Location: [facility];  Service: Cardiovascular;;   Social History   Social History Narrative   Lives in home with husband. Daughters live out of state. Step-daughter in [place]. Has 8 grandchildren. Lives in 2 level home but most living is on first floor. Stairs have handrails, no problem with stairs. No grab bars in bathroom. No tripping hazards. Smoke alarms present.   No pets.    Eats a good variety of foods, husband is a cook. Eats meats, fruits, vegetables. Drinks water mostly. Occasional tea.   Goes to gym 5 times a week. Enjoys reading, sewing, movies. Walking when weather is nice.    Wears seat belt in car.    Immunization History  Administered Date(s) Administered   Fluad Quad(high Dose 65+) 11/29/2019, 12/07/2020   Influenza Split 01/17/2016   Influenza,inj,Quad PF,6+ Mos 11/12/2018   Influenza-Unspecified 04/02/2017, 12/08/2017   Moderna SARS-COV2 Booster Vaccination 01/13/2020   Moderna Sars-Covid-2 Vaccination 04/09/2019, 05/14/2019   Pneumococcal Conjugate-13 10/26/2014   Pneumococcal Polysaccharide-23 11/12/2018   Pneumococcal-Unspecified 09/14/2013   Td 07/18/2011     Objective: Vital Signs: BP 117/74 (BP Location: Left Arm, Patient Position: Sitting, Cuff Size: Small)   Pulse 60   Resp 12   Ht 5\' 4"  (1.626 m)   Wt 142 lb 3.2 oz (64.5 kg)   BMI 24.41 kg/m    Physical Exam Vitals and nursing note reviewed.  Constitutional:      Appearance: She is well-developed.  HENT:     Head: Normocephalic and atraumatic.  Eyes:     Conjunctiva/sclera: Conjunctivae normal.  Cardiovascular:     Rate and Rhythm: Normal rate and regular rhythm.     Heart sounds: Normal heart  sounds.  Pulmonary:     Effort: Pulmonary effort is normal.     Breath sounds: Normal breath sounds.  Abdominal:     General: Bowel sounds are normal.     Palpations: Abdomen is soft.  Musculoskeletal:     Cervical back: Normal range of motion.  Lymphadenopathy:     Cervical: No cervical adenopathy.  Skin:    General: Skin is warm and dry.     Capillary Refill: Capillary refill takes less than 2 seconds.     Comments: Sclerodactyly with hypopigmentation on her fingers was noted.  Telangiectasias were noted.  Nailbed capillary dropout was noted.  Neurological:     Mental Status: She is alert and oriented to person, place, and time.  Psychiatric:        Behavior: Behavior normal.     Musculoskeletal Exam: C-spine was in good range of motion.  Shoulder joints, elbow joints, wrist joints, MCPs PIPs and DIPs with good range of motion.  She has difficulty making a fist due  to sclerodactyly.  Hip joints and knee joints with good range of motion with no tenderness.  There was no tenderness over ankles or MTPs.  CDAI Exam: CDAI Score: -- Patient Global: --; Provider Global: -- Swollen: --; Tender: -- Joint Exam 01/11/2021   No joint exam has been documented for this visit   There is currently no information documented on the homunculus. Go to the Rheumatology activity and complete the homunculus joint exam.  Investigation: No additional findings.  Imaging: No results found.  Recent Labs: Lab Results  Component Value Date   WBC 6.6 07/11/2020   HGB 13.9 07/11/2020   PLT 212 07/11/2020   NA 143 12/05/2020   K 4.4 12/05/2020   CL 107 (H) 12/05/2020   CO2 21 12/05/2020   GLUCOSE 95 12/05/2020   BUN 13 12/05/2020   CREATININE 0.69 12/05/2020   BILITOT 0.5 12/05/2020   ALKPHOS 83 12/05/2020   AST 20 12/05/2020   ALT 13 12/05/2020   PROT 6.5 12/05/2020   ALBUMIN 4.2 12/05/2020   CALCIUM 9.5 12/05/2020   GFRAA 78 07/11/2020    Speciality Comments: PLQ Eye Exam: 08/07/17  PLQ toxcity,  PLQ discotinued @ [facility]  Procedures:  No procedures performed Allergies: Avelox [moxifloxacin hcl in nacl] and Other   Assessment / Plan:     Visit Diagnoses: Scleroderma ([place]) - Limited systemic with Raynauds, Telengectesia, sclerodactyly, arthralgias, erosions in right fifth and left third DIP, ANA centromere: She continues to have sclerodactyly.  Raynauds has been more active with the weather change.  She did not have any digital ulcers.  Nailbed capillary dropout was noted.  She continues to have joint stiffness but no synovitis was noted.  She denies any shortness of breath.  I reviewed Dr. Golden Pop records.  She was evaluated by Dr. Chase Caller in May 2022.  There was no evidence of ILD.  He also perform PFTs.  She does have another visit with him soon.  I reviewed records from Dr. Haroldine Laws.  She is also evaluated by Dr. Haroldine Laws in January 2022.  She has mild obstructive CAD.  There is no pulmonary hypertension.  She denies any new GI symptoms.  High risk medication use - Dr. Katy Fitch advised patient to stop taking PLQ.  She has been off of Plaquenil since May 2019   Raynaud's phenomenon without gangrene-patient had no digital ulcers.  She had no Raynauds, she had nailbed capillary dropout.  Keeping core temperature warm and warm clothing was discussed.  Osteopenia of multiple sites - DEXA 12/07/2019:The BMD measured at Femur Neck Left is 0.827 g/cm2 with a T-score of -1.5.DEXA on 09/06/16: She will have repeat DEXA with her PCP.  Calcium rich diet and exercise was emphasized.  Other medical problems are listed as follows:  History of uterine cancer  History of coronary artery disease-she is followed by Dr. Haroldine Laws.  She is on statins and aspirin.  H/O cardiac radiofrequency ablation  History of hypothyroidism  Orders: Orders Placed This Encounter  Procedures   CBC with Differential/Platelet   COMPLETE METABOLIC PANEL WITH GFR   Urinalysis, Routine w reflex  microscopic    No orders of the defined types were placed in this encounter.    Follow-Up Instructions: Return in about 6 months (around 07/12/2021) for Scleroderma.   Bo Merino, MD  Note - This record has been created using Editor, commissioning.  Chart creation errors have been sought, but may not always  have been located. Such creation errors do not reflect  on  the standard of medical care.

## 2021-01-11 ENCOUNTER — Other Ambulatory Visit: Payer: Self-pay

## 2021-01-11 ENCOUNTER — Encounter: Payer: Self-pay | Admitting: Rheumatology

## 2021-01-11 ENCOUNTER — Ambulatory Visit: Payer: Medicare Other | Admitting: Rheumatology

## 2021-01-11 VITALS — BP 117/74 | HR 60 | Resp 12 | Ht 64.0 in | Wt 142.2 lb

## 2021-01-11 DIAGNOSIS — M349 Systemic sclerosis, unspecified: Secondary | ICD-10-CM

## 2021-01-11 DIAGNOSIS — Z8679 Personal history of other diseases of the circulatory system: Secondary | ICD-10-CM | POA: Diagnosis not present

## 2021-01-11 DIAGNOSIS — M8589 Other specified disorders of bone density and structure, multiple sites: Secondary | ICD-10-CM

## 2021-01-11 DIAGNOSIS — I7300 Raynaud's syndrome without gangrene: Secondary | ICD-10-CM

## 2021-01-11 DIAGNOSIS — Z8542 Personal history of malignant neoplasm of other parts of uterus: Secondary | ICD-10-CM

## 2021-01-11 DIAGNOSIS — Z9889 Other specified postprocedural states: Secondary | ICD-10-CM | POA: Diagnosis not present

## 2021-01-11 DIAGNOSIS — Z79899 Other long term (current) drug therapy: Secondary | ICD-10-CM

## 2021-01-11 DIAGNOSIS — Z8639 Personal history of other endocrine, nutritional and metabolic disease: Secondary | ICD-10-CM | POA: Diagnosis not present

## 2021-01-12 LAB — COMPLETE METABOLIC PANEL WITH GFR
AG Ratio: 2.1 (calc) (ref 1.0–2.5)
ALT: 15 U/L (ref 6–29)
AST: 21 U/L (ref 10–35)
Albumin: 4.5 g/dL (ref 3.6–5.1)
Alkaline phosphatase (APISO): 69 U/L (ref 37–153)
BUN: 18 mg/dL (ref 7–25)
CO2: 22 mmol/L (ref 20–32)
Calcium: 9.5 mg/dL (ref 8.6–10.4)
Chloride: 107 mmol/L (ref 98–110)
Creat: 0.77 mg/dL (ref 0.60–1.00)
Globulin: 2.1 g/dL (calc) (ref 1.9–3.7)
Glucose, Bld: 93 mg/dL (ref 65–99)
Potassium: 4.1 mmol/L (ref 3.5–5.3)
Sodium: 139 mmol/L (ref 135–146)
Total Bilirubin: 0.5 mg/dL (ref 0.2–1.2)
Total Protein: 6.6 g/dL (ref 6.1–8.1)
eGFR: 83 mL/min/{1.73_m2} (ref >=60)

## 2021-01-12 LAB — URINALYSIS, ROUTINE W REFLEX MICROSCOPIC
Bacteria, UA: NONE SEEN /HPF
Bilirubin Urine: NEGATIVE
Glucose, UA: NEGATIVE
Hgb urine dipstick: NEGATIVE
Hyaline Cast: NONE SEEN /LPF
Ketones, ur: NEGATIVE
Nitrite: NEGATIVE
Protein, ur: NEGATIVE
RBC / HPF: NONE SEEN /HPF (ref 0–2)
Specific Gravity, Urine: 1.008 (ref 1.001–1.035)
Squamous Epithelial / HPF: NONE SEEN /HPF (ref <=5)
pH: <=5 (ref 5.0–8.0)

## 2021-01-12 LAB — CBC WITH DIFFERENTIAL/PLATELET
Absolute Monocytes: 598 cells/uL (ref 200–950)
Basophils Absolute: 27 cells/uL (ref 0–200)
Basophils Relative: 0.4 %
Eosinophils Absolute: 170 cells/uL (ref 15–500)
Eosinophils Relative: 2.5 %
HCT: 40.8 % (ref 35.0–45.0)
Hemoglobin: 13.4 g/dL (ref 11.7–15.5)
Lymphs Abs: 2190 cells/uL (ref 850–3900)
MCH: 31.8 pg (ref 27.0–33.0)
MCHC: 32.8 g/dL (ref 32.0–36.0)
MCV: 96.7 fL (ref 80.0–100.0)
MPV: 10.8 fL (ref 7.5–12.5)
Monocytes Relative: 8.8 %
Neutro Abs: 3815 cells/uL (ref 1500–7800)
Neutrophils Relative %: 56.1 %
Platelets: 205 10*3/uL (ref 140–400)
RBC: 4.22 10*6/uL (ref 3.80–5.10)
RDW: 12.1 % (ref 11.0–15.0)
Total Lymphocyte: 32.2 %
WBC: 6.8 10*3/uL (ref 3.8–10.8)

## 2021-01-12 LAB — MICROSCOPIC MESSAGE

## 2021-01-12 NOTE — Progress Notes (Signed)
CBC, CMP are normal.  UA showed trace white cell count which are not significant.

## 2021-01-15 ENCOUNTER — Ambulatory Visit: Payer: Medicare Other | Admitting: Family Medicine

## 2021-02-19 ENCOUNTER — Telehealth (HOSPITAL_COMMUNITY): Payer: Self-pay

## 2021-02-19 NOTE — Telephone Encounter (Signed)
Patient needs a 1 year follow up with echo.  Please place order and contact patient to schedule both appointments.

## 2021-02-20 ENCOUNTER — Other Ambulatory Visit (HOSPITAL_COMMUNITY): Payer: Self-pay | Admitting: *Deleted

## 2021-02-20 DIAGNOSIS — I2510 Atherosclerotic heart disease of native coronary artery without angina pectoris: Secondary | ICD-10-CM

## 2021-02-20 DIAGNOSIS — M349 Systemic sclerosis, unspecified: Secondary | ICD-10-CM

## 2021-03-01 ENCOUNTER — Ambulatory Visit: Payer: Medicare Other | Admitting: Internal Medicine

## 2021-03-01 ENCOUNTER — Other Ambulatory Visit: Payer: Self-pay

## 2021-03-01 ENCOUNTER — Ambulatory Visit (INDEPENDENT_AMBULATORY_CARE_PROVIDER_SITE_OTHER): Payer: Medicare Other | Admitting: Internal Medicine

## 2021-03-01 ENCOUNTER — Encounter: Payer: Self-pay | Admitting: Internal Medicine

## 2021-03-01 VITALS — BP 108/68 | HR 67 | Temp 97.1°F | Ht 64.0 in | Wt 142.0 lb

## 2021-03-01 DIAGNOSIS — R0689 Other abnormalities of breathing: Secondary | ICD-10-CM | POA: Diagnosis not present

## 2021-03-01 DIAGNOSIS — R0600 Dyspnea, unspecified: Secondary | ICD-10-CM

## 2021-03-01 DIAGNOSIS — M349 Systemic sclerosis, unspecified: Secondary | ICD-10-CM | POA: Diagnosis not present

## 2021-03-01 LAB — PULMONARY FUNCTION TEST
DL/VA % pred: 85 %
DL/VA: 3.54 ml/min/mmHg/L
DLCO cor % pred: 76 %
DLCO cor: 14.84 ml/min/mmHg
DLCO unc % pred: 76 %
DLCO unc: 14.84 ml/min/mmHg
FEF 25-75 Pre: 1.84 L/sec
FEF2575-%Pred-Pre: 96 %
FEV1-%Pred-Pre: 90 %
FEV1-Pre: 2.05 L
FEV1FVC-%Pred-Pre: 102 %
FEV6-%Pred-Pre: 91 %
FEV6-Pre: 2.63 L
FEV6FVC-%Pred-Pre: 104 %
FVC-%Pred-Pre: 87 %
FVC-Pre: 2.63 L
Pre FEV1/FVC ratio: 78 %
Pre FEV6/FVC Ratio: 100 %

## 2021-03-01 NOTE — Patient Instructions (Signed)
Spirometry/DLCO performed today. 

## 2021-03-01 NOTE — Progress Notes (Signed)
Spirometry/DLCO performed today. 

## 2021-03-01 NOTE — Progress Notes (Signed)
PCP [name], MD   HPI  IOV 08/16/2016  Chief Complaint  Patient presents with   Advice Only    Referred by Dr. Estanislado Pandy for scleroderma.  c/o worsening sob, chest tightness with exertion X1 month.    HPI 70 year old woman with history of scleroderma on hydroxychloroquine, CAD s/p stent 11/2015, uterine cancer dx in 2008 s/p hysterectomy, chemo/radtx presenting for evaluation of dyspnea on exertion.  She is followed by Dr. Patrecia Pour for rheumatology, Dr. Haroldine Laws for cardiology. Previously was followed by Dr. Gwynneth Aliment in [place]. Last saw Dr. Estanislado Pandy 06/19/2016. She has had scleroderma for 10 years and only has been on hydroxychloroquine. She moved to [facility] in January 2018.  For the past month, she has dyspnea with going up one flight of stairs most times. This is new. Also sometimes gets short of breath with bending over to pick something up. Denies cough. She does yoga, strength training, and walking for exercise. She can walk a couple of miles without any issues. She is independent in all her activities of daily living. Does not use any devices to aid in walking. Denies GERD. Has not had a pulmonologist.  She had shortness of breath that prompted the cardiac evaluation leading to stent placement. She has an appointment with cardiology June 12. Review of Systems  S: See resident physician for details. She has scleroderma for over 10 years for which she is on Actonel. Denies any associated acid reflux. Started on was believed only to involve the skin. I personally evaluated the history that this patient had a few months of shortness of breath in 2017 between summer and fall that then resulted in worsening dyspnea on exertion. That then resulted in a cardiac stent. After this dyspnea resolved. Then subsequently in January 2018 moved from the [place] area to [place], [place]. Now for the last 1 month she's having recurrent dyspnea on exertion. She feels this is  from the heart. She does not think is a lung issue. Relieved by rest. She notices it for climbing stairs relieved by rest. She has cardiology appointment pending. There is a pulmonary function test and echocardiogram pending on 08/27/2016. She is reluctant to get a CT chest. Walking desat test in office 185 feet x  3 laps on RA: 100% at rest and exertion  Recent pertinent labs  Results for DONN, WILMOT (MRN [id number]) as of 08/16/2016 10:29  Ref. Range 06/19/2016 10:38 06/26/2016 10:04  Creatinine Latest Ref Range: 0.50 - 0.99 mg/dL 0.79   Results for XENA, PROPST (MRN [id number]) as of 08/16/2016 10:29  Ref. Range 06/19/2016 10:38 06/26/2016 10:04  Hemoglobin Latest Ref Range: 11.7 - 15.5 g/dL 14.8      has a past medical history of Cancer ([place]).   reports that she has never smoked. She has never used smokeless tobacco.   OV 11/05/2016  Chief Complaint  Patient presents with   Follow-up    Pt here after CT and PFT. Pt denies change in SOB since last OV. Pt denies cough, CP/tightness, f/c/s.     Follow-up scleroderma with associated shortness of breath   last seen in June 2018. At that time he thought the suspicion for interstitial lung disease was low. She had Pulm  function test that showed isolated reduction in diffusion capacity to 63%. This raises the possibility of interstitial lung disease but she did have a high-resolution CT scan of the chest that is documented below. Interstitial lung disease has been ruled out.  This no pulmonary parenchymal abnormality. Her hemoglobin was 14.8 g percent suggesting no anemia causing shortness of breath. She then followed up with cardiology. According to the echocardiogram she does not have pulmonary hypertension which can be seen and started on the patient's. She was on  BRILINTA for CAD - this got changed to Plavix and her dyspnea resolved. Currently she is doing well does not have any rest symptoms.   Results for ILLANA, NOLTING (MRN [id number]) as of  11/05/2016 11:24  Ref. Range 08/27/2016 11:19  FVC-Pre Latest Units: L 2.86  FVC-%Pred-Pre Latest Units: % 91  FEV1-Pre Latest Units: L 2.11  FEV1-%Pred-Pre Latest Units: % 88  Pre FEV1/FVC ratio Latest Units: % 74    Results for KORYN, CHARLOT (MRN [id number]) as of 11/05/2016 11:24  Ref. Range 08/27/2016 11:19  TLC Latest Units: L 4.64  TLC % pred Latest Units: % 91  Results for HILDEGARD, HLAVAC (MRN [id number]) as of 11/05/2016 11:24  Ref. Range 08/27/2016 11:19  DLCO unc Latest Units: ml/min/mmHg 15.42  DLCO unc % pred Latest Units: % 63   IMPRESSION: 1. No evidence of interstitial lung disease. No acute pulmonary disease . 2. Solitary 3 mm solid apical right upper lobe pulmonary nodule. No follow-up needed if patient is low-risk. Non-contrast chest CT can be considered in 12 months if patient is high-risk. This recommendation follows the consensus statement: Guidelines for Management of Incidental Pulmonary Nodules Detected on CT Images: From the Fleischner Society 2017; Radiology 2017; 284:228-243. 3. Small pericardial effusion/thickening. 4. Three-vessel coronary atherosclerosis.   Aortic Atherosclerosis (ICD10-I70.0).     Electronically Signed   By: Ilona Sorrel M.D.   On: 09/03/2016 15:18  OV 12/16/2017  Subjective:  Patient ID: [name], female , DOB: [birth year] , age 70 y.o. , MRN: [id number] , ADDRESS: [address] [place] [facility] [id number]   12/16/2017 -   Chief Complaint  Patient presents with   Follow-up    Pt is here for a 1 year follow up and states she has been doing well. Pt denies any complaints.     HPI Kaitelyn Jamison 70 y.o. -1 year follow-up for ILD monitoring in the setting of scleroderma.  She says overall she is stable.  She is no longer on Plaquenil for scleroderma.  She continues to have Raynaud.  She tells me other than extremely mild shortness of breath IV exertion she is stable.  There is no interim medical issues or surgical issues.  No change in  medications of significance.  No ER visits no hospitalizations.  Only interim medical issues carpal tunnel surgery.  She had a CT scan of the chest July 2019.  This is not a high-resolution CT chest.  On the report there is no evidence of ILD.  Last echocardiogram June 2018 without any pulmonary hypertension.  There is no wheezing cough orthopnea proximal nocturnal dyspnea.    OV 08/10/2020  Subjective:  Patient ID: [name], female , DOB: 1951-01-11 , age 41 y.o. , MRN: [id number] , ADDRESS: [address] [place] [facility] [id number] PCP [name], MD Patient Care Team: [name], MD as PCP - General (Family Medicine) Bensimhon, Shaune Pascal, MD as PCP - Advanced Heart Failure (Cardiology) Bo Merino, MD as Consulting Physician (Rheumatology) Bensimhon, Shaune Pascal, MD as Consulting Physician (Cardiology) Mauri Pole, MD as Consulting Physician (Gastroenterology) Brand Males, MD as Consulting Physician (Pulmonary Disease) Warden Fillers, MD as Consulting Physician (Ophthalmology) Royston Sinner Colin Benton, MD as Consulting Physician (Obstetrics and Gynecology)  This Provider for this visit: Treatment Team:  Attending Provider: Brand Males, MD    08/10/2020 -   Chief Complaint  Patient presents with   Follow-up    Pt states she has been doing okay since last visit. Denies any issues with her breathing.   Follow-up scleroderma with very mild shortness of breath.  At risk for ILD.  - 2019 CT without ILD -2020 pulmonary function test with improvement/normal  HPI Casady Voshell 70 y.o. -returns for follow-up.  I personally saw her in 2019.  Then after that because of the pandemic I did not see her.  She did see nurse practitioner December 2020.  Pulmonary function test was normal//slightly improved/stable.  She is on expectant follow-up with monitoring.  In April 2022 she is a Dr. D her rheumatologist.  She asked to be established back in pulmonary to  see me.  Patient has continued ongoing issues with sclerodactyly and also Raynaud's.  In January 2022 she had echocardiogram that I visualized the result.  It shows valvular regurgitation is worse but no evidence of pulmonary hypertension.  She has very mild shortness of breath if at all for climbing stairs.  Symptom scores are not detailed below.  Walking desaturation test is stable compared to 3 years ago and normal.   PFT Subjective:  Patient ID: [name], female , DOB: 06/12/1950 , age 78 y.o. , MRN: [id number] , ADDRESS: [address] [place] [facility] [id number] PCP [name], MD Patient Care Team: [name], MD as PCP - General (Family Medicine) Bensimhon, Shaune Pascal, MD as PCP - Advanced Heart Failure (Cardiology) Bo Merino, MD as Consulting Physician (Rheumatology) Bensimhon, Shaune Pascal, MD as Consulting Physician (Cardiology) Mauri Pole, MD as Consulting Physician (Gastroenterology) Brand Males, MD as Consulting Physician (Pulmonary Disease) Warden Fillers, MD as Consulting Physician (Ophthalmology) Royston Sinner Colin Benton, MD as Consulting Physician (Obstetrics and Gynecology)  This Provider for this visit: Treatment Team:  Attending Provider: Brand Males, MD    03/01/2021 -   Chief Complaint  Patient presents with   Follow-up    PFT performed today.  Pt states she has been doing okay since last visit and denies any complaints.   Follow-up scleroderma with very mild shortness of breath.  At risk for ILD.  - 2019 CT without ILD -2020 pulmonary function test with improvement/normal  HPI Cayleigh Paull 70 y.o. -returns for follow-up.  In the last 6 months she reports no change in her shortness of breath.  She admits to shortness of breath for stairs relieved by rest.  Last visit 2 she had the same thing but she scored 0 but she actually tells me she has shortness of breath with stairs relieved by rest.  There is no associated chest  pain.  She says this is stable without any worsening.  No cough.  She had pulmonary function test today it shows a slight drop in DLCO below 80%.  The first time there is a drop this low.  I did share these results with her.  Last CT scan of the chest was in 2019.  She is willing to have another CT scan of the chest  She is on pulmonary hypertension monitoring through Dr. Haroldine Laws.  Next echocardiogram was in March 2023 according to history.    CT Chest data  No results found.   SYMPTOM SCALE  08/10/2020  03/01/2021   O2 use ra ra  Shortness of Breath 0 -> 5 scale with 5 being worst (  score 6 If unable to do)   At rest 0 0  Simple tasks - showers, clothes change, eating, shaving 0 0  Household (dishes, doing bed, laundry) 000 0  Shopping 0 0  Walking level at own pace 0 0  Walking up Stairs 0 1  Total (30-36) Dyspnea Score 00 1  How bad is your cough? 0 0  How bad is your fatigue 0 0  How bad is nausea 00 0  How bad is vomiting?  0 0  How bad is diarrhea? 0 0  How bad is anxiety? 0 0  How bad is depression 0 0  0     CT Chest data  No results found.   Simple office walk 185 feet x  3 laps goal with forehead probe 12/16/2017  08/10/2020   O2 used Room air ra  Number laps completed 3 3  Comments about pace Normal brisk avg space  Resting Pulse Ox/HR 99% and 65/min 98% and HR 67  Final Pulse Ox/HR 99% and 74/min 98% and HR 82  Desaturated </= 88% no no  Desaturated <= 3% points no no  Got Tachycardic >/= 90/min no no  Symptoms at end of test non3 No complaints  Miscellaneous comments Normal test     PFT  PFT Results Latest Ref Rng & Units 03/01/2021 03/03/2019 08/27/2016  FVC-Pre L 2.63 2.90 2.86  FVC-Predicted Pre % 87 94 91  FVC-Post L - - 2.98  FVC-Predicted Post % - - 94  Pre FEV1/FVC % % 78 80 74  Post FEV1/FCV % % - - 82  FEV1-Pre L 2.05 2.32 2.11  FEV1-Predicted Pre % 90 99 88  FEV1-Post L - - 2.45  DLCO uncorrected ml/min/mmHg 14.84 16.78 15.42   DLCO UNC% % 76 85 63  DLCO corrected ml/min/mmHg 14.84 - -  DLCO COR %Predicted % 76 - -  DLVA Predicted % 85 93 70  TLC L - - 4.64  TLC % Predicted % - - 91  RV % Predicted % - - 80       has a past medical history of Allergy, Arthritis, CAD (coronary artery disease), Cancer ([place]), CHF (congestive heart failure) ([place]), Heart murmur, Hyperlipidemia, Osteoporosis, and Thyroid disease.   reports that she has never smoked. She has never used smokeless tobacco.  Past Surgical History:  Procedure Laterality Date   ABDOMINAL HYSTERECTOMY     APPENDECTOMY     CARDIAC ELECTROPHYSIOLOGY MAPPING AND ABLATION  04/19/2003   CARPAL TUNNEL RELEASE Right 10/15/2017   CORONARY ANGIOPLASTY WITH STENT PLACEMENT     RIGHT/LEFT HEART CATH AND CORONARY ANGIOGRAPHY [name] 08/14/2017   Procedure: RIGHT/LEFT HEART CATH AND CORONARY ANGIOGRAPHY;  Surgeon: Jolaine Artist, MD;  Location: [facility];  Service: Cardiovascular;  Laterality: [name];   ULTRASOUND GUIDANCE FOR VASCULAR ACCESS  08/14/2017   Procedure: Ultrasound Guidance For Vascular Access;  Surgeon: Jolaine Artist, MD;  Location: [facility];  Service: Cardiovascular;;    Allergies  Allergen Reactions   Avelox [Moxifloxacin Hcl In Nacl] Other (See Comments)    dizziness   Other Other (See Comments)    SEASONAL     Immunization History  Administered Date(s) Administered   Fluad Quad(high Dose 65+) 11/29/2019, 12/07/2020   Influenza Split 01/17/2016   Influenza,inj,Quad PF,6+ Mos 11/12/2018   Influenza-Unspecified 04/02/2017, 12/08/2017   Moderna SARS-COV2 Booster Vaccination 01/13/2020   Moderna Sars-Covid-2 Vaccination 04/09/2019, 05/14/2019   Pneumococcal Conjugate-13 10/26/2014   Pneumococcal Polysaccharide-23 11/12/2018  Pneumococcal-Unspecified 09/14/2013   Td 07/18/2011    Family History  Problem Relation Age of Onset   Asthma Maternal Aunt    Heart disease Mother    Diabetes Mother    Heart disease  Father    Stomach cancer Brother    Diabetes Brother    Colon cancer Neg Hx    Esophageal cancer Neg Hx    Rectal cancer Neg Hx      Current Outpatient Medications:    albuterol (PROVENTIL HFA;VENTOLIN HFA) 108 (90 Base) MCG/ACT inhaler, INHALE 2 PUFFS INTO THE LUNGS EVERY 4 HOURS AS NEEDED FOR WHEEZING OR SHORTNESS OF BREATH, Disp: 90 g, Rfl: 6   aspirin EC 81 MG tablet, Take 81 mg by mouth daily. , Disp: , Rfl:    atorvastatin (LIPITOR) 80 MG tablet, TAKE 1 TABLET BY MOUTH  DAILY, Disp: 90 tablet, Rfl: 3   Calcium Carbonate-Vitamin D (CALCIUM-VITAMIN D3 PO), Take 1 tablet by mouth daily., Disp: , Rfl:    cycloSPORINE (RESTASIS) 0.05 % ophthalmic emulsion, Place 1 drop into both eyes 2 (two) times daily., Disp: , Rfl:    levothyroxine (SYNTHROID) 75 MCG tablet, TAKE 1 TABLET BY MOUTH  DAILY BEFORE BREAKFAST, Disp: 90 tablet, Rfl: 3   Multiple Vitamin (MULTI-VITAMINS) TABS, Take 1 tablet by mouth daily. , Disp: , Rfl:    pantoprazole (PROTONIX) 40 MG tablet, Take 1 tablet (40 mg total) by mouth 2 (two) times daily before a meal. Appointment needed for refills, Disp: 180 tablet, Rfl: 3   vitamin B-12 (CYANOCOBALAMIN) 100 MCG tablet, Take 100 mcg by mouth daily., Disp: , Rfl:       Objective:   Vitals:   03/01/21 1528  BP: 108/68  Pulse: 67  Temp: (!) 97.1 F (36.2 C)  TempSrc: Oral  SpO2: 96%  Weight: 142 lb (64.4 kg)  Height: 5\' 4"  (1.626 m)    Estimated body mass index is 24.37 kg/m as calculated from the following:   Height as of this encounter: 5\' 4"  (1.626 m).   Weight as of this encounter: 142 lb (64.4 kg).  @WEIGHTCHANGE @  Filed Weights   03/01/21 1528  Weight: 142 lb (64.4 kg)     Physical Exam  General: No distress. Looks well Neuro: Alert and Oriented x 3. GCS 15. Speech normal Psych: Pleasant Resp:  Barrel Chest - no.  Wheeze - no, Crackles - no, No overt respiratory distress CVS: Normal heart sounds. Murmurs - no Ext: Stigmata of Connective Tissue  Disease - YES . SCLERODERMA HEENT: Normal upper airway. PEERL +. No post nasal drip        Assessment:       ICD-10-CM   1. Scleroderma (HCC)  M34.9 CT Chest High Resolution    Pulmonary function test    2. Dyspnea and respiratory abnormalities  R06.00 CT Chest High Resolution   R06.89 Pulmonary function test         Plan:     Patient Instructions     ICD-10-CM   1. Dyspnea and respiratory abnormalities  R06.00    R06.89   2. Scleroderma (HCC)  M34.9      Very mild shortness of breath for stairs that is baseline. No evidence of ILD disease from scleroderma based on July 2019 CT, PFT Dec 2020 but PFT 03/01/2021 suggests drop in difffusion that could be day variation or early ILD   No evidence of pulmonary hypertension on Echo Jan 2022 (though there is leaky valve worsening)   Plan -  need to see you sooner and ensure you are not developing ILD - do spirometry and dlco in 6 months  - do HRCT supine and prone in 6 months  followup - 6 months with  Dr Chase Caller 15 min slot but after PFT  - symptom socre and walk test at next visit    SIGNATURE    Dr. Brand Males, M.D., F.C.C.P,  Pulmonary and Critical Care Medicine Staff Physician, [place] Director - Interstitial Lung Disease  Program  Pulmonary [place] [facility] [place], [place], [address]  Pager: [phone], If no answer or between  15:00h - 7:00h: call [phone] Telephone: [phone]  4:52 PM 03/01/2021

## 2021-03-01 NOTE — Patient Instructions (Addendum)
ICD-10-CM   1. Dyspnea and respiratory abnormalities  R06.00    R06.89   2. Scleroderma (HCC)  M34.9      Very mild shortness of breath for stairs that is baseline. No evidence of ILD disease from scleroderma based on July 2019 CT, PFT Dec 2020 but PFT 03/01/2021 suggests drop in difffusion that could be day variation or early ILD   No evidence of pulmonary hypertension on Echo Jan 2022 (though there is leaky valve worsening)   Plan - need to see you sooner and ensure you are not developing ILD - do spirometry and dlco in 6 months  - do HRCT supine and prone in 6 months  followup - 6 months with  Dr Chase Caller 15 min slot but after PFT  - symptom socre and walk test at next visit

## 2021-04-12 ENCOUNTER — Encounter: Payer: Self-pay | Admitting: Family Medicine

## 2021-04-19 ENCOUNTER — Ambulatory Visit (HOSPITAL_COMMUNITY)
Admission: RE | Admit: 2021-04-19 | Discharge: 2021-04-19 | Disposition: A | Discharge status: Home / Self Care | Payer: Medicare Other | Source: Ambulatory Visit | Attending: Internal Medicine | Admitting: Internal Medicine

## 2021-04-19 ENCOUNTER — Other Ambulatory Visit: Payer: Self-pay

## 2021-04-19 ENCOUNTER — Ambulatory Visit (HOSPITAL_BASED_OUTPATIENT_CLINIC_OR_DEPARTMENT_OTHER)
Admission: RE | Admit: 2021-04-19 | Discharge: 2021-04-19 | Disposition: A | Discharge status: Home / Self Care | Payer: Medicare Other | Source: Ambulatory Visit | Attending: Internal Medicine | Admitting: Internal Medicine

## 2021-04-19 ENCOUNTER — Encounter (HOSPITAL_COMMUNITY): Payer: Self-pay | Admitting: Internal Medicine

## 2021-04-19 VITALS — BP 118/70 | HR 60 | Wt 142.4 lb

## 2021-04-19 DIAGNOSIS — Z7982 Long term (current) use of aspirin: Secondary | ICD-10-CM | POA: Insufficient documentation

## 2021-04-19 DIAGNOSIS — I080 Rheumatic disorders of both mitral and aortic valves: Secondary | ICD-10-CM | POA: Diagnosis not present

## 2021-04-19 DIAGNOSIS — I110 Hypertensive heart disease with heart failure: Secondary | ICD-10-CM | POA: Diagnosis not present

## 2021-04-19 DIAGNOSIS — M349 Systemic sclerosis, unspecified: Secondary | ICD-10-CM

## 2021-04-19 DIAGNOSIS — Z955 Presence of coronary angioplasty implant and graft: Secondary | ICD-10-CM | POA: Diagnosis not present

## 2021-04-19 DIAGNOSIS — I2510 Atherosclerotic heart disease of native coronary artery without angina pectoris: Secondary | ICD-10-CM

## 2021-04-19 DIAGNOSIS — Z9889 Other specified postprocedural states: Secondary | ICD-10-CM

## 2021-04-19 DIAGNOSIS — E785 Hyperlipidemia, unspecified: Secondary | ICD-10-CM | POA: Diagnosis not present

## 2021-04-19 DIAGNOSIS — I351 Nonrheumatic aortic (valve) insufficiency: Secondary | ICD-10-CM | POA: Diagnosis not present

## 2021-04-19 DIAGNOSIS — M3489 Other systemic sclerosis: Secondary | ICD-10-CM | POA: Diagnosis not present

## 2021-04-19 DIAGNOSIS — Z79899 Other long term (current) drug therapy: Secondary | ICD-10-CM | POA: Insufficient documentation

## 2021-04-19 DIAGNOSIS — I509 Heart failure, unspecified: Secondary | ICD-10-CM | POA: Diagnosis not present

## 2021-04-19 LAB — ECHOCARDIOGRAM COMPLETE
AR max vel: 2.53 cm2
AV Area VTI: 2.43 cm2
AV Area mean vel: 2.41 cm2
AV Mean grad: 5 mmHg
AV Peak grad: 9.3 mmHg
Ao pk vel: 1.53 m/s
Calc EF: 68 %
MV M vel: 2.85 m/s
MV Peak grad: 32.5 mmHg
MV VTI: 3.07 cm2
P 1/2 time: 371 msec
S' Lateral: 2.59 cm
Single Plane A2C EF: 67.2 %
Single Plane A4C EF: 68.7 %

## 2021-04-19 NOTE — Patient Instructions (Signed)
Thank you for your visit today.  No changes to your medication.  Your physician has requested that you have an echocardiogram. Echocardiography is a painless test that uses sound waves to create images of your heart. It provides your doctor with information about the size and shape of your heart and how well your hearts chambers and valves are working. This procedure takes approximately one hour. There are no restrictions for this procedure.  Your physician has recommended that you have a pulmonary function test. Pulmonary Function Tests are a group of tests that measure how well air moves in and out of your lungs.  Your physician recommends that you schedule a follow-up appointment in: 1 year.  If you have any questions or concerns before your next appointment please send [place] a message through [place] or call our office at [phone].    TO LEAVE A MESSAGE FOR THE NURSE SELECT OPTION 2, PLEASE LEAVE A MESSAGE INCLUDING: YOUR NAME DATE OF BIRTH CALL BACK NUMBER REASON FOR CALL**this is important as we prioritize the call backs  YOU WILL RECEIVE A CALL BACK THE SAME DAY AS LONG AS YOU CALL BEFORE 4:00 PM  At the [facility], you and your health needs are our priority. As part of our continuing mission to provide you with exceptional heart care, we have created designated Provider Care Teams. These Care Teams include your primary Cardiologist (physician) and Advanced Practice Providers (APPs- Physician Assistants and Nurse Practitioners) who all work together to provide you with the care you need, when you need it.   You may see any of the following providers on your designated Care Team at your next follow up: Dr Glori Bickers Dr Haynes Kerns, NP Lyda Jester, [place] [facility] [place], [place] Audry Riles, PharmD   Please be sure to bring in all your medications bottles to every appointment.

## 2021-04-19 NOTE — Progress Notes (Signed)
PCP: Dr. Andria Frames Referring: Dr. Kirke Corin    HPI:  Rhonda Brewer is a 71 y/o woman with h/o scleroderma, CAD s/p stent 9/17 , SVT s/p ablation 2/05 referred by Dr. Patrecia Pour for screening for [facility] in setting of scleroderma.   Previously lived in [place]. 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 At time had exertional fatigue and dyspnea and arm tingling. No CP.    Cath 11/24/15 LM: mild irregs LAD: 20% mid LCX: mild irreg RCA: mRCA 99% ->Xience Alpine DES 3.5x76mm  Echo 11/22/15: LVEF 55-60% Mild AI. Normal RV. Mild TR.  Echo 08/27/16: EF 60-65%,  RV normal Mild AI. No RV strain or PAH.   PFTs 08/2016 FEV1 2.11 (88%) FVC 2.86 (91%) DLCO 63%  PFTs 12/20 FEV1 2.32 (99%) FVC 2.90 (94%) DLCO 85%   She was seen in the HF clinic for initial evaluation in June 2018. It was felt that her dyspnea could be due to Brilinta. She was switched to Plavix. Chest CT was ordered and showed no interstitial lung disease.   Since we last saw her underwent cath 5/19 which showed very mild CAD. No evidence. There was a small mstep-up in saturations at RA level suggestive of anomalous pulmonary vein or shunt at atrial level   Mid RCA lesion is 20% stenosed. Prox LAD lesion is 40% stenosed.  Here for routine f/u. Doing great. Exercising regularly. No CP or SOB. No edema.   Echo 2/23 (today): 55-60% mild MR mild to mod AI  RV normal Personally reviewed  Echo 1/22 EF 55-60% mild MR, mild to mod AI Personally reviewed  PFTs 12/22 FEV1 2.05L (90%) FVC 2.63L (87%) DLCO 76%   Cath 5/19:   Ao = 137/66 (94) LV = 141/13 RA = 2 RV = 20/4 PA = 19/6 (12) PCW = 7 Fick cardiac output/index = 5.3/3.1 Thermo CO/CI = 3.6/2.2 PVR = 0.7 WU SVR 1402 Ao sat = 99% PA sat = 79%, 80% High SVC sat = 68% RA sat = 78% RV sat = 75% Qp/Qs = 1.11 Assessment:   1. CAD with patent RCA stent and mild  non-obstructive CAD in LAD 2. Normal LVEF 60-65% 3. Normal right heart pressures with no evidence of PAH 4. Small step-up in saturations at RA level suggestive of anomalous pulmonary vein or shunt at atrial level      Past Medical History:  Diagnosis Date   Allergy    Arthritis    CAD (coronary artery disease)    a. DES to RCA 11/2015   Cancer [facility])    Uterine 2008   CHF (congestive heart failure) (HCC)    Heart murmur    Hyperlipidemia    Osteoporosis    Thyroid disease     Current Outpatient Medications  Medication Sig Dispense Refill   albuterol (PROVENTIL HFA;VENTOLIN HFA) 108 (90 Base) MCG/ACT inhaler INHALE 2 PUFFS INTO THE LUNGS EVERY 4 HOURS AS NEEDED FOR WHEEZING OR SHORTNESS OF BREATH 90 g 6   aspirin EC 81 MG tablet Take 81 mg by mouth daily.      atorvastatin (LIPITOR) 80 MG tablet TAKE 1 TABLET BY MOUTH  DAILY 90 tablet 3   Calcium Carbonate-Vitamin D (CALCIUM-VITAMIN D3 PO) Take 1 tablet by mouth daily.     cycloSPORINE (RESTASIS) 0.05 % ophthalmic emulsion Place 1 drop into both eyes 2 (two) times daily.  levothyroxine (SYNTHROID) 75 MCG tablet TAKE 1 TABLET BY MOUTH  DAILY BEFORE BREAKFAST 90 tablet 3   Multiple Vitamin (MULTI-VITAMINS) TABS Take 1 tablet by mouth daily.      pantoprazole (PROTONIX) 40 MG tablet Take 1 tablet (40 mg total) by mouth 2 (two) times daily before a meal. Appointment needed for refills 180 tablet 3   vitamin B-12 (CYANOCOBALAMIN) 100 MCG tablet Take 100 mcg by mouth daily.     No current facility-administered medications for this encounter.     Allergies  Allergen Reactions   Avelox [Moxifloxacin Hcl In Nacl] Other (See Comments)    dizziness   Other Other (See Comments)    SEASONAL     Social History   Socioeconomic History   Marital status: Married    Spouse name: Cay Schillings   Number of children: 2   Years of education: 12   Highest education level: 12th grade  Occupational History   Occupation: retired    Comment:  Scientist, water quality  Tobacco Use   Smoking status: Never   Smokeless tobacco: Never  Vaping Use   Vaping Use: Never used  Substance and Sexual Activity   Alcohol use: Yes    Comment: occasional wine   Drug use: No   Sexual activity: Yes    Birth control/protection: Surgical    Comment: hysterectomy  Other Topics Concern   Not on file  Social History Narrative   Lives in home with husband. Daughters live out of state. Step-daughter in [place]. Has 8 grandchildren. Lives in 2 level home but most living is on first floor. Stairs have handrails, no problem with stairs. No grab bars in bathroom. No tripping hazards. Smoke alarms present.   No pets.    Eats a good variety of foods, husband is a cook. Eats meats, fruits, vegetables. Drinks water mostly. Occasional tea.   Goes to gym 5 times a week. Enjoys reading, sewing, movies. Walking when weather is nice.    Wears seat belt in car.    Social Determinants of Health   Financial Resource Strain: Not on file  Food Insecurity: Not on file  Transportation Needs: Not on file  Physical Activity: Not on file  Stress: Not on file  Social Connections: Not on file  Intimate Partner Violence: Not on file    Family History  Problem Relation Age of Onset   Asthma Maternal Aunt    Heart disease Mother    Diabetes Mother    Heart disease Father    Stomach cancer Brother    Diabetes Brother    Colon cancer Neg Hx    Esophageal cancer Neg Hx    Rectal cancer Neg Hx     PHYSICAL EXAM: Vitals:   04/19/21 1120  BP: 118/70  Pulse: 60  SpO2: 99%   General:  Well appearing. No resp difficulty HEENT: normal +telangectacias   Neck: supple. no JVD. Carotids 2+ bilat; no bruits. No lymphadenopathy or thryomegaly appreciated. Cor: PMI nondisplaced. Regular rate & rhythm. No rubs, gallops or murmurs. Lungs: clear Abdomen: soft, nontender, nondistended. No hepatosplenomegaly. No bruits or masses. Good bowel sounds. Extremities: no cyanosis,  clubbing, rash, edema + joint thickening Neuro: alert & orientedx3, cranial nerves grossly intact. moves all 4 extremities w/o difficulty. Affect pleasant   ASSESSMENT & PLAN:  1. CAD s/p RCA stent in 11/2015: - repeat cath 5/19 for recurrent CP show mild non-obstructive CAD - results reviewed with her - very active. No s/s ischemia - Continue ASA and  atorvastatin.   2. Scleroderma - DLCO mildly reduced on PFTs 6/18. No R heart strain on echo. CT without ILD.  - RHC 5/19 no PAH - PFTs 12/20 with normalization of DLCO.  - No evidence of scleroderma-related lung disease (PAH or fibrosis).  - Echo and recent PFTs stable. No sign PAH  3. Hypertension - Blood pressure well controlled. Continue current regimen.  4. Valvular disease - mild MR and mild-mod AI - stable on echo today. LV dimensions ok   Glori Bickers, MD  11:33 AM

## 2021-05-24 DIAGNOSIS — Z1231 Encounter for screening mammogram for malignant neoplasm of breast: Secondary | ICD-10-CM | POA: Diagnosis not present

## 2021-05-24 LAB — HM MAMMOGRAPHY

## 2021-05-28 ENCOUNTER — Ambulatory Visit: Payer: Medicare Other | Attending: Obstetrics and Gynecology | Admitting: Physical Therapy

## 2021-05-28 ENCOUNTER — Encounter: Payer: Self-pay | Admitting: Family Medicine

## 2021-05-28 ENCOUNTER — Encounter: Payer: Self-pay | Admitting: Physical Therapy

## 2021-05-28 ENCOUNTER — Other Ambulatory Visit: Payer: Self-pay

## 2021-05-28 DIAGNOSIS — M62838 Other muscle spasm: Secondary | ICD-10-CM | POA: Diagnosis not present

## 2021-05-28 DIAGNOSIS — M6281 Muscle weakness (generalized): Secondary | ICD-10-CM | POA: Insufficient documentation

## 2021-05-28 NOTE — Therapy (Incomplete)
OUTPATIENT PHYSICAL THERAPY FEMALE PELVIC EVALUATION   Patient Name: Rhonda Brewer MRN: [id number] DOB:12/12/1950, 71 y.o., female Today's Date: 05/28/2021    Past Medical History:  Diagnosis Date   Allergy    Arthritis    CAD (coronary artery disease)    a. DES to RCA 11/2015   Cancer [facility])    Uterine 2008   CHF (congestive heart failure) (HCC)    Heart murmur    Hyperlipidemia    Osteoporosis    Thyroid disease    Past Surgical History:  Procedure Laterality Date   ABDOMINAL HYSTERECTOMY     APPENDECTOMY     CARDIAC ELECTROPHYSIOLOGY MAPPING AND ABLATION  04/19/2003   CARPAL TUNNEL RELEASE Right 10/15/2017   CORONARY ANGIOPLASTY WITH STENT PLACEMENT     RIGHT/LEFT HEART CATH AND CORONARY ANGIOGRAPHY [name] 08/14/2017   Procedure: RIGHT/LEFT HEART CATH AND CORONARY ANGIOGRAPHY;  Surgeon: Jolaine Artist, MD;  Location: [place] [facility];  Service: Cardiovascular;  Laterality: [name];   ULTRASOUND GUIDANCE FOR VASCULAR ACCESS  08/14/2017   Procedure: Ultrasound Guidance For Vascular Access;  Surgeon: Jolaine Artist, MD;  Location: [place] [facility];  Service: Cardiovascular;;   Patient Active Problem List   Diagnosis Date Noted   Family history of diabetes mellitus (DM) 12/07/2020   De Quervain's tenosynovitis, left 12/07/2020   Preventative health care 12/01/2019   Constipation 11/12/2018   Carpal tunnel syndrome 05/29/2017   Low back pain without sciatica 04/29/2017   Screening for breast cancer 09/04/2016   H/O cardiac radiofrequency ablation 06/26/2016   CAD (coronary artery disease), native coronary artery 06/19/2016   History of hypothyroidism 06/19/2016   History of uterine cancer 06/19/2016   Scleroderma ([place]) 06/16/2016   High risk medication use 06/16/2016   Osteopenia of multiple sites 06/16/2016    PCP: [name], MD  REFERRING PROVIDER: Tyson Dense, *  REFERRING DIAG: N30.10 (ICD-10-CM) - Interstitial cystitis (chronic)  without hematuria  THERAPY DIAG:  No diagnosis found.  ONSET DATE: This episode since February  SUBJECTIVE:                                                                                                                                                                                           SUBJECTIVE STATEMENT: I have stopped with things that flared up Fluid intake: 1 coffee and 60 oz water/day  Patient confirms identification and approves PT to assess pelvic floor and treatment {yes/no:20286}  PERTINENT HISTORY:  *** Sexual abuse: {Yes/No:304960894}  PAIN:  Are you having pain? Yes NPRS scale: 2/10 (up to 6/10 when it flares up) Pain location:  lower abdominal  region mid  Pain type: dull Pain description:  pressure  and like I have to urinate more  Aggravating factors: certain food/drink Relieving factors: heat   BOWEL MOVEMENT Pain with bowel movement: No Type of bowel movement:Strain No Fully empty rectum: Yes:   Leakage: No Pads: No Fiber supplement: No  URINATION Pain with urination: Yes just a little Fully empty bladder: Yes:   Stream: Strong Urgency: Yes: if I wait moderately urgent but I can get there Frequency: more than usual; nocturia 2x Leakage: sometimes with coughing just when laying down Pads: No  INTERCOURSE Pain with intercourse:  No  PREGNANCY 2 Vaginal deliveries 2   PROLAPSE Nothing noticed  PRECAUTIONS: None  WEIGHT BEARING RESTRICTIONS No  FALLS:  Has patient fallen in last 6 months? No, Number of falls: 0  LIVING ENVIRONMENT: Lives with: lives with their family and lives with their spouse Lives in: House/apartment   OCCUPATION: retired  PLOF: Independent  PATIENT GOALS no pain and not having to go as often to the bathroom, workouts without feeling more pain   OBJECTIVE:   DIAGNOSTIC FINDINGS:  ***  PATIENT SURVEYS:  {rehab surveys:24030}  PFIQ-7 ***  COGNITION:  Overall cognitive status: Within  functional limits for tasks assessed     MUSCLE LENGTH: Hamstrings: Right 80 deg; Left 90 deg   POSTURE:  Anterior pelvic tilt; rounded shoulders, kyphosis in thoracic spine  PALPATION: Internal Pelvic Floor ***  External Perineal Exam ***  GENERAL ***  LUMBARAROM/PROM  A/PROM A/PROM  05/28/2021  Flexion   Extension   Right lateral flexion   Left lateral flexion   Right rotation   Left rotation    (Blank rows = not tested)  LE ROM:  Passive ROM Right 05/28/2021 Left 05/28/2021  Hip flexion    Hip extension    Hip abduction    Hip adduction    Hip internal rotation    Hip external rotation    Knee flexion    Knee extension    Ankle dorsiflexion    Ankle plantarflexion    Ankle inversion    Ankle eversion     (Blank rows = not tested)  LE MMT:  MMT Right 05/28/2021 Left 05/28/2021  Hip flexion    Hip extension    Hip abduction    Hip adduction    Hip internal rotation    Hip external rotation    Knee flexion    Knee extension    Ankle dorsiflexion    Ankle plantarflexion    Ankle inversion    Ankle eversion     PELVIC MMT:   MMT  05/28/2021  Vaginal 3/5  Internal Anal Sphincter   External Anal Sphincter   Puborectalis   Diastasis Recti No  (Blank rows = not tested)   TONE: Difficutly relaxing after contracting  PROLAPSE: None noticeable in supine  LUMBAR SPECIAL TESTS:  Straight leg raise test: Positive Rt side easier ASLR with compression and pelvic stability  FUNCTIONAL TESTS:  SLS reverse trendelenburg mild - no UE support needed x 8 sec  GAIT:  Comments: WFL    TODAY'S TREATMENT  EVAL    PATIENT EDUCATION:  Education details: *** Person educated: {Person educated:25204} Education method: {Education Method:25205} Education comprehension: {Education Comprehension:25206}   HOME EXERCISE PROGRAM: ***  ASSESSMENT:  CLINICAL IMPRESSION: Patient is a *** y.o. *** who was seen today for physical therapy evaluation  and treatment for ***.    OBJECTIVE IMPAIRMENTS {opptimpairments:25111}.   ACTIVITY LIMITATIONS {activity limitations:25113}.  PERSONAL FACTORS {Personal factors:25162} are also affecting patient's functional outcome.    REHAB POTENTIAL: {rehabpotential:25112}  CLINICAL DECISION MAKING: {clinical decision making:25114}  EVALUATION COMPLEXITY: {Evaluation complexity:25115}   GOALS: Goals reviewed with patient? {yes/no:20286}  SHORT TERM GOALS: Target date: {follow up:25551}  *** Baseline: *** Goal status: {GOALSTATUS:25110}  2.  *** Baseline: *** Goal status: {GOALSTATUS:25110}  3.  *** Baseline: *** Goal status: {GOALSTATUS:25110}  4.  *** Baseline: *** Goal status: {GOALSTATUS:25110}  5.  *** Baseline: *** Goal status: {GOALSTATUS:25110}  6.  *** Baseline: *** Goal status: {GOALSTATUS:25110}  LONG TERM GOALS: Target date: {follow up:25551}  *** Baseline: *** Goal status: {GOALSTATUS:25110}  2.  *** Baseline: *** Goal status: {GOALSTATUS:25110}  3.  *** Baseline: *** Goal status: {GOALSTATUS:25110}  4.  *** Baseline: *** Goal status: {GOALSTATUS:25110}  5.  *** Baseline: *** Goal status: {GOALSTATUS:25110}  6.  *** Baseline: *** Goal status: {GOALSTATUS:25110}   PLAN: PT FREQUENCY: {rehab frequency:25116}  PT DURATION: {rehab duration:25117}  PLANNED INTERVENTIONS: {rehab planned interventions:25118::"Therapeutic exercises","Therapeutic activity","Neuromuscular re-education","Balance training","Gait training","Patient/Family education","Joint mobilization"}  PLAN FOR NEXT SESSION: ***   Camillo Flaming Davieon Stockham, PT 05/28/2021, 2:29 PM

## 2021-05-28 NOTE — Therapy (Deleted)
?OUTPATIENT PHYSICAL THERAPY FEMALE PELVIC EVALUATION ? ? ?Patient Name: Rhonda Brewer ?MRN: [id number] ?DOB:05-19-1950, 71 y.o., female ?Today's Date: 05/28/2021 ? ? ? ?Past Medical History:  ?Diagnosis Date  ? Allergy   ? Arthritis   ? CAD (coronary artery disease)   ? a. DES to RCA 11/2015  ? Cancer [facility])   ? Uterine 2008  ? CHF (congestive heart failure) ([place])   ? Heart murmur   ? Hyperlipidemia   ? Osteoporosis   ? Thyroid disease   ? ?Past Surgical History:  ?Procedure Laterality Date  ? ABDOMINAL HYSTERECTOMY    ? APPENDECTOMY    ? CARDIAC ELECTROPHYSIOLOGY MAPPING AND ABLATION  04/19/2003  ? CARPAL TUNNEL RELEASE Right 10/15/2017  ? CORONARY ANGIOPLASTY WITH STENT PLACEMENT    ? RIGHT/LEFT HEART CATH AND CORONARY ANGIOGRAPHY [name] 08/14/2017  ? Procedure: RIGHT/LEFT HEART CATH AND CORONARY ANGIOGRAPHY;  Surgeon: Jolaine Artist, MD;  Location: [place] [facility];  Service: Cardiovascular;  Laterality: [name];  ? ULTRASOUND GUIDANCE FOR VASCULAR ACCESS  08/14/2017  ? Procedure: Ultrasound Guidance For Vascular Access;  Surgeon: Jolaine Artist, MD;  Location: [place] [facility];  Service: Cardiovascular;;  ? ?Patient Active Problem List  ? Diagnosis Date Noted  ? Family history of diabetes mellitus (DM) 12/07/2020  ? De Quervain's tenosynovitis, left 12/07/2020  ? Preventative health care 12/01/2019  ? Constipation 11/12/2018  ? Carpal tunnel syndrome 05/29/2017  ? Low back pain without sciatica 04/29/2017  ? Screening for breast cancer 09/04/2016  ? H/O cardiac radiofrequency ablation 06/26/2016  ? CAD (coronary artery disease), native coronary artery 06/19/2016  ? History of hypothyroidism 06/19/2016  ? History of uterine cancer 06/19/2016  ? Scleroderma ([place]) 06/16/2016  ? High risk medication use 06/16/2016  ? Osteopenia of multiple sites 06/16/2016  ? ? ?PCP: [name], MD ? ?REFERRING PROVIDER: Tyson Dense, * ? ?REFERRING DIAG: N30.10 (ICD-10-CM) - Interstitial cystitis (chronic)  without hematuria ? ?THERAPY DIAG:  ?No diagnosis found. ? ?ONSET DATE: This episode since February ? ?SUBJECTIVE:                                                                                                                                                                                          ? ?SUBJECTIVE STATEMENT: ?I have stopped with things that flared up ?Fluid intake: 1 coffee and 60 oz water/day ? ?Patient confirms identification and approves PT to assess pelvic floor and treatment {yes/no:20286} ? ?PERTINENT HISTORY:  ?*** ?Sexual abuse: {Yes/No:304960894} ? ?PAIN:  ?Are you having pain? {yes/no:20286} ?NPRS scale: ***/10 ?Pain location: {pelvic pain location:27098} ? ?Pain type: {type:313116} ?Pain description: {PAIN  DESCRIPTION:21022940}  ? ?Aggravating factors: *** ?Relieving factors: *** ? ? ?BOWEL MOVEMENT ?Pain with bowel movement: {yes/no:20286} ?Type of bowel movement:{PT BM type:27100} ?Fully empty rectum: {Yes/No:[id number]} ?Leakage: {Yes/No:304960894} ?Pads: {Yes/No:[id number]} ?Fiber supplement: {Yes/No:304960894} ? ?URINATION ?Pain with urination: {yes/no:20286} ?Fully empty bladder: {Yes/No:[id number]} ?Stream: {PT urination:27102} ?Urgency: {Yes/No:[id number]} ?Frequency: *** ?Leakage: {PT leakage:27103} ?Pads: {Yes/No:[id number]} ? ?INTERCOURSE ?Pain with intercourse: {pain with intercourse PA:27099} ?Ability to have vaginal penetration:  {Yes/No:[id number]} ?Types of stimulation: *** ?[place]: {Yes/No:[id number]} ?Marinoff Scale  ? ?PREGNANCY ?Vaginal deliveries *** ?Tearing {Yes/No:[id number]} ?C-section deliveries *** ?Currently pregnant {Yes/No:[id number]} ? ?PROLAPSE ?{PT prolapse:27101} ? ?PRECAUTIONS: {Therapy precautions:24002} ? ?WEIGHT BEARING RESTRICTIONS {Yes ***/No:24003} ? ?FALLS:  ?Has patient fallen in last 6 months? {yes/no:20286}, Number of falls: *** ? ?LIVING ENVIRONMENT: ?Lives with: {[facility] lives with:25569::"lives with their family"} ?Lives in: {Lives in:25570} ?Stairs:  {yes/no:20286}; {Stairs:24000} ?Has following equipment at home: {Assistive devices:23999} ? ?OCCUPATION: *** ? ?PLOF: {PLOF:24004} ? ?PATIENT GOALS *** ? ? ?OBJECTIVE:  ? ?DIAGNOSTIC FINDINGS:  ?*** ? ?PATIENT SURVEYS:  ?{rehab surveys:24030} ? ?PFIQ-7 *** ? ?COGNITION: ? Overall cognitive status: {cognition:24006}   ?  ?SENSATION: ? Light touch: {intact/deficits:24005} ? Proprioception: {intact/deficits:24005} ? ?MUSCLE LENGTH: ?Hamstrings: Right *** deg; Left *** deg ?Thomas test: Right *** deg; Left *** deg ? ?POSTURE:  ?*** ? ?PALPATION: ?Internal Pelvic Floor *** ? ?External Perineal Exam *** ? ?GENERAL *** ? ?LUMBARAROM/PROM ? ?A/PROM A/PROM  ?05/28/2021  ?Flexion   ?Extension   ?Right lateral flexion   ?Left lateral flexion   ?Right rotation   ?Left rotation   ? (Blank rows = not tested) ? ?LE ROM: ? ?{AROM/PROM:27142} ROM Right ?05/28/2021 Left ?05/28/2021  ?Hip flexion    ?Hip extension    ?Hip abduction    ?Hip adduction    ?Hip internal rotation    ?Hip external rotation    ?Knee flexion    ?Knee extension    ?Ankle dorsiflexion    ?Ankle plantarflexion    ?Ankle inversion    ?Ankle eversion    ? (Blank rows = not tested) ? ?LE MMT: ? ?MMT Right ?05/28/2021 Left ?05/28/2021  ?Hip flexion    ?Hip extension    ?Hip abduction    ?Hip adduction    ?Hip internal rotation    ?Hip external rotation    ?Knee flexion    ?Knee extension    ?Ankle dorsiflexion    ?Ankle plantarflexion    ?Ankle inversion    ?Ankle eversion    ? ?PELVIC MMT: ?  ?MMT  ?05/28/2021  ?Vaginal   ?Internal Anal Sphincter   ?External Anal Sphincter   ?Puborectalis   ?Diastasis Recti   ?(Blank rows = not tested) ? ? ?TONE: ?*** ? ?PROLAPSE: ?*** ? ?LUMBAR SPECIAL TESTS:  ?{lumbar special test:25242} ? ?FUNCTIONAL TESTS:  ?{Functional tests:24029} ? ?GAIT: ?Distance walked: *** ?Assistive device utilized: {Assistive devices:23999} ?Level of assistance: {Levels of assistance:24026} ?Comments: *** ? ? ? ?TODAY'S TREATMENT  ?EVAL *** ? ? ?PATIENT  EDUCATION:  ?Education details: *** ?Person educated: {Person educated:25204} ?Education method: {Education Method:25205} ?Education comprehension: {Education Comprehension:25206} ? ? ?HOME EXERCISE PROGRAM: ?*** ? ?ASSESSMENT: ? ?CLINICAL IMPRESSION: ?Patient is a *** y.o. *** who was seen today for physical therapy evaluation and treatment for ***.  ? ? ?OBJECTIVE IMPAIRMENTS {opptimpairments:25111}.  ? ?ACTIVITY LIMITATIONS {activity limitations:25113}.  ? ?PERSONAL FACTORS {Personal factors:25162} are also affecting patient's functional outcome.  ? ? ?REHAB POTENTIAL: {rehabpotential:25112} ? ?CLINICAL DECISION MAKING: {clinical decision making:25114} ? ?EVALUATION COMPLEXITY: {Evaluation complexity:25115} ? ? ?GOALS: ?Goals reviewed  with patient? {yes/no:20286} ? ?SHORT TERM GOALS: Target date: {follow up:25551} ? ?*** ?Baseline: *** ?Goal status: {GOALSTATUS:25110} ? ?2.  *** ?Baseline: *** ?Goal status: {GOALSTATUS:25110} ? ?3.  *** ?Baseline: *** ?Goal status: {GOALSTATUS:25110} ? ?4.  *** ?Baseline: *** ?Goal status: {GOALSTATUS:25110} ? ?5.  *** ?Baseline: *** ?Goal status: {GOALSTATUS:25110} ? ?6.  *** ?Baseline: *** ?Goal status: {GOALSTATUS:25110} ? ?LONG TERM GOALS: Target date: {follow up:25551} ? ?*** ?Baseline: *** ?Goal status: {GOALSTATUS:25110} ? ?2.  *** ?Baseline: *** ?Goal status: {GOALSTATUS:25110} ? ?3.  *** ?Baseline: *** ?Goal status: {GOALSTATUS:25110} ? ?4.  *** ?Baseline: *** ?Goal status: {GOALSTATUS:25110} ? ?5.  *** ?Baseline: *** ?Goal status: {GOALSTATUS:25110} ? ?6.  *** ?Baseline: *** ?Goal status: {GOALSTATUS:25110} ? ? ?PLAN: ?PT FREQUENCY: {rehab frequency:25116} ? ?PT DURATION: {rehab duration:25117} ? ?PLANNED INTERVENTIONS: {rehab planned interventions:25118::"Therapeutic exercises","Therapeutic activity","Neuromuscular re-education","Balance training","Gait training","Patient/Family education","Joint mobilization"} ? ?PLAN FOR NEXT SESSION: *** ? ? ?Jule Ser,  PT ?05/28/2021, 2:29 PM ? ?

## 2021-05-30 ENCOUNTER — Ambulatory Visit (INDEPENDENT_AMBULATORY_CARE_PROVIDER_SITE_OTHER): Payer: Medicare Other | Admitting: Family Medicine

## 2021-05-30 VITALS — BP 99/60 | Ht 64.0 in | Wt 141.0 lb

## 2021-05-30 DIAGNOSIS — M654 Radial styloid tenosynovitis [de Quervain]: Secondary | ICD-10-CM | POA: Diagnosis not present

## 2021-05-30 MED ORDER — METHYLPREDNISOLONE ACETATE 40 MG/ML IJ SUSP
20.0000 mg | Freq: Once | INTRAMUSCULAR | Status: AC
  Administered 2021-05-30: 20 mg via INTRA_ARTICULAR

## 2021-05-30 NOTE — Patient Instructions (Signed)
You have deQuervain's tenosynovitis of your thumb/wrist. ?Avoid painful activities as much as possible. ?Wear the thumb spica brace as often as possible to rest this. ?Ice 15 minutes at a time 3-4 times a day. ?Use voltaren gel up to 4 times a day for pain and inflammation. ?We repeated the injection today. ?If this comes back again and is bothering you enough call me and we would refer you to a surgeon at that point. ? ?

## 2021-05-31 ENCOUNTER — Encounter: Payer: Self-pay | Admitting: Family Medicine

## 2021-05-31 NOTE — Progress Notes (Signed)
? ?  PCP: [name], MD ? ?Subjective:  ? ?HPI: ?Patient is a 71 y.o. female here for left wrist pain concerning for de Quervain's synovitis. ? ?9/26: ?Patient reports that she has been having left thumb and wrist pain for 2 months.  She reports pain is worse with lifting using her left hand.  Patient reports that she is right-hand dominant.  She reports that she has pain especially at night if she moves in certain ways that apply pressure to the radial aspect of her left wrist.  Patient reports that she recently had a physical with her PCP and was told that she had de Quervain's.  She states that she has been wearing a wrist brace that she previously used for carpal tunnel which does help her to sleep.  She states that she attempts to avoid oral NSAIDs due to concern for GI symptoms.  She states that she has Voltaren gel at home to use topically.  She denies any skin changes.  Patient also reports history of arthritis as well as injections for trigger finger in the past. ? ?10/10: ?Patient returns today for injection for dequervain's tenosynovitis. ? ?05/30/21: ?Patient reports she did very well up until about a month ago. ?Pain radial side of left wrist worsened at that time. ?Associated swelling, stiffness. ?Pain picking up items. ? ?    ?Objective:  ?Physical Exam: ? ?Gen: NAD, comfortable in exam room ? ?Left wrist: ?No deformity. ?FROM with 5/5 strength - pain with thumb flexion, extension, abduction. ?Tenderness to palpation 1st dorsal compartment.  No tenderness 1st CMC, carpal tunnel ?NVI distally. ?Negative tinels carpal tunnel.  Positive finkelstein. ?  ?Assessment & Plan:  ?Left dequervain's tenosynovitis - repeated injection today as noted below.  Advised if this recurs again would refer to hand surgeon.  Icing, thumb spica brace, voltaren gel if needed. ? ?After informed written consent timeout was performed.  Patient was seated on exam table.  Area overlying left 1st dorsal compartment prepped  with alcohol swab.  Then utilizing ultrasound guidance patient's left 1st dorsal compartment was injected with 0.5:0.47m lidocaine: depomedrol.  Patient tolerated the procedure well without immediate complications. ?

## 2021-06-01 ENCOUNTER — Encounter: Payer: Self-pay | Admitting: Family Medicine

## 2021-06-05 ENCOUNTER — Other Ambulatory Visit: Payer: Self-pay

## 2021-06-05 ENCOUNTER — Ambulatory Visit: Payer: Medicare Other | Admitting: Physical Therapy

## 2021-06-05 ENCOUNTER — Encounter: Payer: Self-pay | Admitting: Physical Therapy

## 2021-06-05 DIAGNOSIS — M6281 Muscle weakness (generalized): Secondary | ICD-10-CM | POA: Diagnosis not present

## 2021-06-05 DIAGNOSIS — M62838 Other muscle spasm: Secondary | ICD-10-CM | POA: Diagnosis not present

## 2021-06-05 NOTE — Therapy (Signed)
?OUTPATIENT PHYSICAL THERAPY TREATMENT NOTE ? ? ?Patient Name: Rhonda Brewer ?MRN: [id number] ?DOB:04-22-1950, 71 y.o., female ?Today's Date: 06/05/2021 ? ?PCP: [name], MD ?REFERRING PROVIDER: Tyson Dense, * ? ? PT End of Session - 06/05/21 6945   ? ? Visit Number 2   ? Date for PT Re-Evaluation 08/20/21   ? Authorization Type UHC   ? PT Start Time 0802   ? PT Stop Time [phone]   ? PT Time Calculation (min) 40 min   ? ?  ?  ? ?  ? ? ?Past Medical History:  ?Diagnosis Date  ? Allergy   ? Arthritis   ? CAD (coronary artery disease)   ? a. DES to RCA 11/2015  ? Cancer [facility])   ? Uterine 2008  ? CHF (congestive heart failure) ([place])   ? Heart murmur   ? Hyperlipidemia   ? Osteoporosis   ? Thyroid disease   ? ?Past Surgical History:  ?Procedure Laterality Date  ? ABDOMINAL HYSTERECTOMY    ? APPENDECTOMY    ? CARDIAC ELECTROPHYSIOLOGY MAPPING AND ABLATION  04/19/2003  ? CARPAL TUNNEL RELEASE Right 10/15/2017  ? CORONARY ANGIOPLASTY WITH STENT PLACEMENT    ? RIGHT/LEFT HEART CATH AND CORONARY ANGIOGRAPHY [name] 08/14/2017  ? Procedure: RIGHT/LEFT HEART CATH AND CORONARY ANGIOGRAPHY;  Surgeon: Jolaine Artist, MD;  Location: [place] [facility];  Service: Cardiovascular;  Laterality: [name];  ? ULTRASOUND GUIDANCE FOR VASCULAR ACCESS  08/14/2017  ? Procedure: Ultrasound Guidance For Vascular Access;  Surgeon: Jolaine Artist, MD;  Location: [place] [facility];  Service: Cardiovascular;;  ? ?Patient Active Problem List  ? Diagnosis Date Noted  ? Family history of diabetes mellitus (DM) 12/07/2020  ? De Quervain's tenosynovitis, left 12/07/2020  ? Preventative health care 12/01/2019  ? Constipation 11/12/2018  ? Carpal tunnel syndrome 05/29/2017  ? Low back pain without sciatica 04/29/2017  ? Screening for breast cancer 09/04/2016  ? H/O cardiac radiofrequency ablation 06/26/2016  ? CAD (coronary artery disease), native coronary artery 06/19/2016  ? History of hypothyroidism 06/19/2016  ? History of uterine  cancer 06/19/2016  ? Scleroderma ([place]) 06/16/2016  ? High risk medication use 06/16/2016  ? Osteopenia of multiple sites 06/16/2016  ? ? ?REFERRING DIAG: N30.10 (ICD-10-CM) - Interstitial cystitis (chronic) without hematuria ? ?THERAPY DIAG:  ?Other muscle spasm ? ?Muscle weakness (generalized) ? ?PERTINENT HISTORY:  ? ?PRECAUTIONS: none ? ?SUBJECTIVE: I am feeling pressure around my bladder, it is very dull today.  Overall, it has been feeling better ? ? ?PAIN:  ?Are you having pain? Yes: NPRS scale: 1.233/10 ?Pain location: bladder ?Pain description: pressure ? ? ? ? ?OBJECTIVE:  ?  ?  ?COGNITION: ?           Overall cognitive status: Within functional limits for tasks assessed              ?  ?  ?MUSCLE LENGTH: ?Hamstrings: Right 80 deg; Left 90 deg ?  ?  ?POSTURE:  ?Anterior pelvic tilt; rounded shoulders, kyphosis in thoracic spine ? ?  ?PALPATION: ?Internal Pelvic Floor levators tight Rt>Lt ?  ?External Perineal Exam normal ?  ?GENERAL high tone and difficulty relaxing ?  ? ?  ? ?  ?  ?LE MMT: ?  ?MMT Right ?05/29/2021 Left ?05/29/2021  ?Hip flexion      ?Hip extension      ?Hip abduction      ?Hip adduction 4/5 4/5  ?Hip internal rotation      ?  Hip external rotation      ?Knee flexion      ?Knee extension      ?Ankle dorsiflexion      ?Ankle plantarflexion      ?Ankle inversion      ?Ankle eversion      ?  ?PELVIC MMT: ?  ?MMT   ?05/29/2021  ?Vaginal 3/5  ?Internal Anal Sphincter    ?External Anal Sphincter    ?Puborectalis    ?Diastasis Recti No  ?(Blank rows = not tested) ?  ?  ?TONE: ?Difficutly relaxing after contracting ?  ?PROLAPSE: ?None noticeable in supine ?  ?LUMBAR SPECIAL TESTS:  ?Straight leg raise test: Positive Rt side easier ASLR with compression and pelvic stability ?  ?FUNCTIONAL TESTS:  ?SLS reverse trendelenburg mild - no UE support needed x 8 sec ?  ?GAIT: ?  ?Comments: WFL ?  ?  ?  ?TODAY'S TREATMENT  ?Treatment:06/05/21 ?Exercises  ?LE march and tap - 20x ?Side lying hip abduction  20x ?Child pose hip ER and IR ?Chlid pose with threading ? ?Manual ?Patient confirms identification and approves physical therapist to perform internal soft tissue work   ?Internal STM levators Rt>Lt ? ?Nuero Re-ed ?Education and cues for coordination of breathing and pelvic floor muscle contracting and relaxing at appropriate times ?Correcting Lt anterior pelvic tilt ? ?Therapeutic activities ?  ?PATIENT EDUCATION:  ?Education details: Access Code: [email or web address] ?Person educated: Patient ?Education method: Explanation, Demonstration, Tactile cues, Verbal cues, and Handouts ?Education comprehension: verbalized understanding and returned demonstration ?  ?  ?HOME EXERCISE PROGRAM: ?Added LE taps and woodpeckers  ?  ?ASSESSMENT: ?  ?CLINICAL IMPRESSION: ?Pt did well with treatment. Rt release in pelvic floor muscles was palpated and correction of Lt pelvic rotation noted after treatment.  Pt has some core weakness that was addressed and exercises added.  Pt will benefit from skilled PT to continue to work on improved core and muscle coordination ?  ?  ?  ?OBJECTIVE IMPAIRMENTS decreased activity tolerance, decreased coordination, decreased endurance, difficulty walking, decreased strength, increased fascial restrictions, increased muscle spasms, and pain.  ?  ?ACTIVITY LIMITATIONS community activity and toileting .  ?  ?PERSONAL FACTORS 1 comorbidity: IC  are also affecting patient's functional outcome.  ?  ?  ?REHAB POTENTIAL: Excellent ?  ?CLINICAL DECISION MAKING: Evolving/moderate complexity ?  ?EVALUATION COMPLEXITY: Moderate ?  ?  ?GOALS: ?Goals reviewed with patient? Yes ?  ?SHORT TERM GOALS: Target date: 06/25/21 ?  ?Pt will be ind with initial HEP ?Baseline:  ?Goal status: MET  ?  ?LONG TERM GOALS: Target date: 08/20/21 ?  ?Pt will be independent with advanced HEP to maintain improvements made throughout therapy ?  ?Baseline:  ?Goal status: INITIAL ?  ?2.  Pt will report 75% reduction of pain due to improvements in  posture, strength, and muscle length  ?Baseline:  ?Goal status: INITIAL ?  ?3.  Pt will be able to workout for 45 minutes without increased discomfort  ?Baseline:  ?Goal status: INITIAL ?  ?4.  Pt will report normal bladder emptying due to improved soft tissue length ?Baseline:  ?Goal statu: INITIAL ?  ?  ?  ?  ?PLAN: ?PT FREQUENCY: 1x/week ?  ?PT DURATION: 12 weeks ?  ?PLANNED INTERVENTIONS: Therapeutic exercises, Therapeutic activity, Neuromuscular re-education, Balance training, Gait training, Patient/Family education, Joint mobilization, Dry Needling, Electrical stimulation, Cryotherapy, Moist heat, Taping, Biofeedback, and Manual therapy ?  ?PLAN FOR NEXT SESSION: internal soft tissue if still having pressure; check  pelvic alignment Lt anterior rotation, woodpecker review and add weight, progress core ?  ? ? ?Jule Ser, PT ?06/05/2021, 8:04 AM ? ?   ?

## 2021-06-11 NOTE — Therapy (Signed)
?OUTPATIENT PHYSICAL THERAPY TREATMENT NOTE ? ? ?Patient Name: Rhonda Brewer ?MRN: [id number] ?DOB:1950/10/21, 71 y.o., female ?Today's Date: 06/12/2021 ? ?PCP: [name], MD ?REFERRING PROVIDER: [name], MD ? ? PT End of Session - 06/12/21 1402   ? ? Visit Number 3   ? Date for PT Re-Evaluation 08/20/21   ? Authorization Type UHC   ? PT Start Time 1400   ? PT Stop Time 1440   ? PT Time Calculation (min) 40 min   ? Activity Tolerance Patient tolerated treatment well   ? Behavior During Therapy [facility] for tasks assessed/performed   ? ?  ?  ? ?  ? ? ? ?Past Medical History:  ?Diagnosis Date  ? Allergy   ? Arthritis   ? CAD (coronary artery disease)   ? a. DES to RCA 11/2015  ? Cancer [facility])   ? Uterine 2008  ? CHF (congestive heart failure) ([place])   ? Heart murmur   ? Hyperlipidemia   ? Osteoporosis   ? Thyroid disease   ? ?Past Surgical History:  ?Procedure Laterality Date  ? ABDOMINAL HYSTERECTOMY    ? APPENDECTOMY    ? CARDIAC ELECTROPHYSIOLOGY MAPPING AND ABLATION  04/19/2003  ? CARPAL TUNNEL RELEASE Right 10/15/2017  ? CORONARY ANGIOPLASTY WITH STENT PLACEMENT    ? RIGHT/LEFT HEART CATH AND CORONARY ANGIOGRAPHY [name] 08/14/2017  ? Procedure: RIGHT/LEFT HEART CATH AND CORONARY ANGIOGRAPHY;  Surgeon: Jolaine Artist, MD;  Location: [place] [facility];  Service: Cardiovascular;  Laterality: [name];  ? ULTRASOUND GUIDANCE FOR VASCULAR ACCESS  08/14/2017  ? Procedure: Ultrasound Guidance For Vascular Access;  Surgeon: Jolaine Artist, MD;  Location: [place] [facility];  Service: Cardiovascular;;  ? ?Patient Active Problem List  ? Diagnosis Date Noted  ? Family history of diabetes mellitus (DM) 12/07/2020  ? De Quervain's tenosynovitis, left 12/07/2020  ? Preventative health care 12/01/2019  ? Constipation 11/12/2018  ? Carpal tunnel syndrome 05/29/2017  ? Low back pain without sciatica 04/29/2017  ? Screening for breast cancer 09/04/2016  ? H/O cardiac radiofrequency ablation 06/26/2016  ? CAD (coronary  artery disease), native coronary artery 06/19/2016  ? History of hypothyroidism 06/19/2016  ? History of uterine cancer 06/19/2016  ? Scleroderma ([place]) 06/16/2016  ? High risk medication use 06/16/2016  ? Osteopenia of multiple sites 06/16/2016  ? ? ?REFERRING DIAG: N30.10 (ICD-10-CM) - Interstitial cystitis (chronic) without hematuria ? ?THERAPY DIAG:  ?Other muscle spasm ? ?Muscle weakness (generalized) ? ?PERTINENT HISTORY:  ? ?PRECAUTIONS: none ? ?SUBJECTIVE: No pain right now maybe just a little when I pee.  Overall, it has been feeling better ? ? ?PAIN:  ?Are you having pain? Yes: NPRS scale: 0/10 ?Pain location: bladder ?Pain description: pressure ? ? ? ? ?OBJECTIVE:  ?  ?  ? ?  ?PALPATION: ?Internal Pelvic Floor levators tight Rt>Lt ?  ?External Perineal Exam normal ?  ?GENERAL high tone and difficulty relaxing ?  ?  ?LE MMT: ?  ?MMT Right ?05/29/2021 Left ?05/29/2021  ?Hip flexion      ?Hip extension      ?Hip abduction      ?Hip adduction 4/5 4/5  ?Hip internal rotation      ?Hip external rotation      ?Knee flexion      ?Knee extension      ?Ankle dorsiflexion      ?Ankle plantarflexion      ?Ankle inversion      ?Ankle eversion      ?  ?  PELVIC MMT: ?  ?MMT   ?05/29/2021  ?Vaginal 3/5  ?Internal Anal Sphincter    ?External Anal Sphincter    ?Puborectalis    ?Diastasis Recti No  ?(Blank rows = not tested) ?  ?  ?TONE: ?Difficutly relaxing after contracting ?  ?PROLAPSE: ?None noticeable in supine ?  ?LUMBAR SPECIAL TESTS:  ?Straight leg raise test: Positive Rt side easier ASLR with compression and pelvic stability ?  ?FUNCTIONAL TESTS:  ?SLS reverse trendelenburg mild - no UE support needed x 8 sec ? ?  ?  ?  ?TODAY'S TREATMENT  ?Treatment: 06/12/21 ?Exercises  ?Woodpeckers with 4lb 2x10 ?Abduction 2x10 ?Adduction 2x10 ?Squat with green band 15x ?MET to self to correct anterior hip rotation ? ?Manual ?MFR to abdomen around umbilicus and bladder in all 6 planes ? ? ? ?Treatment:06/05/21 ?Exercises  ?LE march and  tap - 20x ?Side lying hip abduction 20x ?Child pose hip ER and IR ?Chlid pose with threading ? ?Manual ?Patient confirms identification and approves physical therapist to perform internal soft tissue work   ?Internal STM levators Rt>Lt ? ?Nuero Re-ed ?Education and cues for coordination of breathing and pelvic floor muscle contracting and relaxing at appropriate times ?Correcting Lt anterior pelvic tilt ? ?Therapeutic activities ?  ?PATIENT EDUCATION:  ?Education details: Access Code: [email or web address] ?Person educated: Patient ?Education method: Explanation, Demonstration, Tactile cues, Verbal cues, and Handouts ?Education comprehension: verbalized understanding and returned demonstration ?  ?  ?HOME EXERCISE PROGRAM: ?Added muscle energy techniques ?  ?ASSESSMENT: ?  ?CLINICAL IMPRESSION: ?Pt made progress with 90% less pain.  Pt had good fascial release around bladder. She was still anteriorly rotated left pelvis but was able to correct and gave exercise at home to maintain pelvic alignment.  Pt still benefits from skilled PT to continue with soft tissue flexibility and core strength ?  ?  ?  ?OBJECTIVE IMPAIRMENTS decreased activity tolerance, decreased coordination, decreased endurance, difficulty walking, decreased strength, increased fascial restrictions, increased muscle spasms, and pain.  ?  ?ACTIVITY LIMITATIONS community activity and toileting .  ?  ?PERSONAL FACTORS 1 comorbidity: IC  are also affecting patient's functional outcome.  ?  ?  ?REHAB POTENTIAL: Excellent ?  ?CLINICAL DECISION MAKING: Evolving/moderate complexity ?  ?EVALUATION COMPLEXITY: Moderate ?  ?  ?GOALS: ?Goals reviewed with patient? Yes ?  ?SHORT TERM GOALS: Target date: 06/25/21 ?  ?Pt will be ind with initial HEP ?Baseline:  ?Goal status: MET  ?  ?LONG TERM GOALS: Target date: 08/20/21 ?  ?Pt will be independent with advanced HEP to maintain improvements made throughout therapy ?  ?Baseline: 06/12/21 ? ?Goal status:Ongoing  ?  ?2.  Pt will  report 75% reduction of pain due to improvements in posture, strength, and muscle length  ?Baseline: 90% 06/12/21 ? ?Goal status:  ?  ?3.  Pt will be able to workout for 45 minutes without increased discomfort  ?Baseline:  ?Goal status: ongoing ?  ?4.  Pt will report normal bladder emptying due to improved soft tissue length ?Baseline: normal stream, just a small amount of pain ?Goal statu: ongoing ?  ?  ?  ?  ?PLAN: ?PT FREQUENCY: 1x/week ?  ?PT DURATION: 12 weeks ?  ?PLANNED INTERVENTIONS: Therapeutic exercises, Therapeutic activity, Neuromuscular re-education, Balance training, Gait training, Patient/Family education, Joint mobilization, Dry Needling, Electrical stimulation, Cryotherapy, Moist heat, Taping, Biofeedback, and Manual therapy ?  ?PLAN FOR NEXT SESSION: internal soft tissue if still having pressure; check pelvic alignment Lt anterior rotation, progress core and  hip strength and stability ?  ? ? ?Jule Ser, PT ?06/12/2021, 2:03 PM ? ?   ?

## 2021-06-12 ENCOUNTER — Ambulatory Visit: Payer: Medicare Other | Admitting: Physical Therapy

## 2021-06-12 ENCOUNTER — Other Ambulatory Visit: Payer: Self-pay

## 2021-06-12 ENCOUNTER — Encounter: Payer: Self-pay | Admitting: Physical Therapy

## 2021-06-12 DIAGNOSIS — M6281 Muscle weakness (generalized): Secondary | ICD-10-CM

## 2021-06-12 DIAGNOSIS — M62838 Other muscle spasm: Secondary | ICD-10-CM

## 2021-06-19 DIAGNOSIS — Z79899 Other long term (current) drug therapy: Secondary | ICD-10-CM | POA: Diagnosis not present

## 2021-06-19 DIAGNOSIS — M069 Rheumatoid arthritis, unspecified: Secondary | ICD-10-CM | POA: Diagnosis not present

## 2021-06-19 DIAGNOSIS — H04123 Dry eye syndrome of bilateral lacrimal glands: Secondary | ICD-10-CM | POA: Diagnosis not present

## 2021-06-19 DIAGNOSIS — H25811 Combined forms of age-related cataract, right eye: Secondary | ICD-10-CM | POA: Diagnosis not present

## 2021-06-26 ENCOUNTER — Ambulatory Visit: Payer: Medicare Other | Attending: Obstetrics and Gynecology | Admitting: Physical Therapy

## 2021-06-26 ENCOUNTER — Encounter: Payer: Self-pay | Admitting: Physical Therapy

## 2021-06-26 DIAGNOSIS — M6281 Muscle weakness (generalized): Secondary | ICD-10-CM | POA: Insufficient documentation

## 2021-06-26 DIAGNOSIS — M62838 Other muscle spasm: Secondary | ICD-10-CM | POA: Insufficient documentation

## 2021-06-26 NOTE — Therapy (Signed)
?OUTPATIENT PHYSICAL THERAPY TREATMENT NOTE ? ? ?Patient Name: Rhonda Brewer ?MRN: [id number] ?DOB:24-Sep-1950, 71 y.o., female ?Today's Date: 06/26/2021 ? ?PCP: [name], MD ?REFERRING PROVIDER: Tyson Dense, * ? ? PT End of Session - 06/26/21 1451   ? ? Visit Number 4   ? Date for PT Re-Evaluation 08/20/21   ? Authorization Type UHC   ? PT Start Time 1448   ? PT Stop Time 1528   ? PT Time Calculation (min) 40 min   ? Activity Tolerance Patient tolerated treatment well   ? Behavior During Therapy [facility] for tasks assessed/performed   ? ?  ?  ? ?  ? ? ? ? ?Past Medical History:  ?Diagnosis Date  ? Allergy   ? Arthritis   ? CAD (coronary artery disease)   ? a. DES to RCA 11/2015  ? Cancer [facility])   ? Uterine 2008  ? CHF (congestive heart failure) ([place])   ? Heart murmur   ? Hyperlipidemia   ? Osteoporosis   ? Thyroid disease   ? ?Past Surgical History:  ?Procedure Laterality Date  ? ABDOMINAL HYSTERECTOMY    ? APPENDECTOMY    ? CARDIAC ELECTROPHYSIOLOGY MAPPING AND ABLATION  04/19/2003  ? CARPAL TUNNEL RELEASE Right 10/15/2017  ? CORONARY ANGIOPLASTY WITH STENT PLACEMENT    ? RIGHT/LEFT HEART CATH AND CORONARY ANGIOGRAPHY [name] 08/14/2017  ? Procedure: RIGHT/LEFT HEART CATH AND CORONARY ANGIOGRAPHY;  Surgeon: Jolaine Artist, MD;  Location: [place] [facility];  Service: Cardiovascular;  Laterality: [name];  ? ULTRASOUND GUIDANCE FOR VASCULAR ACCESS  08/14/2017  ? Procedure: Ultrasound Guidance For Vascular Access;  Surgeon: Jolaine Artist, MD;  Location: [place] [facility];  Service: Cardiovascular;;  ? ?Patient Active Problem List  ? Diagnosis Date Noted  ? Family history of diabetes mellitus (DM) 12/07/2020  ? De Quervain's tenosynovitis, left 12/07/2020  ? Preventative health care 12/01/2019  ? Constipation 11/12/2018  ? Carpal tunnel syndrome 05/29/2017  ? Low back pain without sciatica 04/29/2017  ? Screening for breast cancer 09/04/2016  ? H/O cardiac radiofrequency ablation 06/26/2016  ? CAD  (coronary artery disease), native coronary artery 06/19/2016  ? History of hypothyroidism 06/19/2016  ? History of uterine cancer 06/19/2016  ? Scleroderma ([place]) 06/16/2016  ? High risk medication use 06/16/2016  ? Osteopenia of multiple sites 06/16/2016  ? ? ?REFERRING DIAG: N30.10 (ICD-10-CM) - Interstitial cystitis (chronic) without hematuria ? ?THERAPY DIAG:  ?Other muscle spasm ? ?Muscle weakness (generalized) ? ?PERTINENT HISTORY:  ? ?PRECAUTIONS: none ? ?SUBJECTIVE: No pain right now It is 98% better ? ?PAIN:  ?Are you having pain? Yes: NPRS scale: 0/10 ?Pain location: bladder ?Pain description: pressure ? ? ? ? ?OBJECTIVE:  ?  ?  ? ?  ?PALPATION: ?Internal Pelvic Floor levators tight Rt>Lt ?  ?External Perineal Exam normal ?  ?GENERAL high tone and difficulty relaxing ?  ?  ?LE MMT: ?  ?MMT Right ?05/29/2021 Left ?05/29/2021  ?Hip flexion      ?Hip extension      ?Hip abduction      ?Hip adduction 4/5 4/5  ?Hip internal rotation      ?Hip external rotation      ?Knee flexion      ?Knee extension      ?Ankle dorsiflexion      ?Ankle plantarflexion      ?Ankle inversion      ?Ankle eversion      ?  ?PELVIC MMT: ?  ?MMT   ?  05/29/2021  ?Vaginal 3/5  ?Internal Anal Sphincter    ?External Anal Sphincter    ?Puborectalis    ?Diastasis Recti No  ?(Blank rows = not tested) ?  ?  ?TONE: ?Difficutly relaxing after contracting ?  ?PROLAPSE: ?None noticeable in supine ?  ?LUMBAR SPECIAL TESTS:  ?Straight leg raise test: Positive Rt side easier ASLR with compression and pelvic stability ?  ?FUNCTIONAL TESTS:  ?SLS reverse trendelenburg mild - no UE support needed x 8 sec ? ?  ?  ?  ?TODAY'S TREATMENT  ?Treatment: 06/26/21 ?Exercise: ?Core with exhale on exertion - 10x ?Tapping toes - 10x ?UE overhead - 10x ?Alt UE/LE dead bug - 10x ?Palpating pelvis and ensure pelvic alignment symmetry ? ?Manual ?MFR to abdomen around umbilicus and bladder  ?Patient confirms identification and approves physical therapist to perform internal  soft tissue work : internal to levators with breathing and bulging Rt>Lt ? ?Treatment: 06/12/21 ?Exercises  ?Woodpeckers with 4lb 2x10 ?Abduction 2x10 ?Adduction 2x10 ?Squat with green band 15x ?MET to self to correct anterior hip rotation ? ?Manual ?MFR to abdomen around umbilicus and bladder in all 6 planes ? ? ? ?Treatment:06/05/21 ?Exercises  ?LE march and tap - 20x ?Side lying hip abduction 20x ?Child pose hip ER and IR ?Chlid pose with threading ? ?Manual ?Patient confirms identification and approves physical therapist to perform internal soft tissue work   ?Internal STM levators Rt>Lt ? ?Nuero Re-ed ?Education and cues for coordination of breathing and pelvic floor muscle contracting and relaxing at appropriate times ?Correcting Lt anterior pelvic tilt ? ?Therapeutic activities ?  ?PATIENT EDUCATION:  ?Education details: Access Code: [email or web address] ?Person educated: Patient ?Education method: Explanation, Demonstration, Tactile cues, Verbal cues, and Handouts ?Education comprehension: verbalized understanding and returned demonstration ?  ?  ?HOME EXERCISE PROGRAM: ?Added muscle energy techniques ?  ?ASSESSMENT: ?  ?CLINICAL IMPRESSION: ?Pt made progress with 98% less pain reported today.  Pt had good fascial release around bladder and release to pelvic soft tissue. She was not anteriorly rotated left pelvis so able to maintain with HEP.  Pt needed some cues to prevent bulging the abdomen when doing exercises. Pt still benefits from skilled PT to continue with soft tissue flexibility and core strength ?  ?  ?  ?OBJECTIVE IMPAIRMENTS decreased activity tolerance, decreased coordination, decreased endurance, difficulty walking, decreased strength, increased fascial restrictions, increased muscle spasms, and pain.  ?  ?ACTIVITY LIMITATIONS community activity and toileting .  ?  ?PERSONAL FACTORS 1 comorbidity: IC  are also affecting patient's functional outcome.  ?  ?  ?REHAB POTENTIAL: Excellent ?  ?CLINICAL DECISION  MAKING: Evolving/moderate complexity ?  ?EVALUATION COMPLEXITY: Moderate ?  ?  ?GOALS: ?Goals reviewed with patient? Yes ?  ?SHORT TERM GOALS: Target date: 06/25/21 ?  ?Pt will be ind with initial HEP ?Baseline:  ?Goal status: MET  ?  ?LONG TERM GOALS: Target date: 08/20/21 ?  ?Pt will be independent with advanced HEP to maintain improvements made throughout therapy ?  ?Baseline: 06/26/21 ? ?Goal status:Ongoing  ?  ?2.  Pt will report 75% reduction of pain due to improvements in posture, strength, and muscle length  ?Baseline: 98% 06/26/21 ? ?Goal status: Achieved 06/26/21 ? ?  ?3.  Pt will be able to workout for 45 minutes without increased discomfort  ?Baseline:  ?Goal status: ongoing ?  ?4.  Pt will report normal bladder emptying due to improved soft tissue length ?Baseline: normal stream and normal emptying; sometimes more core work triggers  it when emptying bladder ?Goal status: partially met ?  ?  ?  ?  ?PLAN: ?PT FREQUENCY: 1x/week ?  ?PT DURATION: 12 weeks ?  ?PLANNED INTERVENTIONS: Therapeutic exercises, Therapeutic activity, Neuromuscular re-education, Balance training, Gait training, Patient/Family education, Joint mobilization, Dry Needling, Electrical stimulation, Cryotherapy, Moist heat, Taping, Biofeedback, and Manual therapy ?  ?PLAN FOR NEXT SESSION: follow up on how internal soft tissue did and core exercises porgressions without bulging or valsalva ? ?Jule Ser, PT ?06/26/2021, 5:02 PM ? ?   ?

## 2021-06-28 NOTE — Progress Notes (Deleted)
Office Visit Note  Patient: Rhonda Brewer             Date of Birth: [birth year]           MRN: [id number]             PCP: [name], MD Referring: [name], MD Visit Date: 07/12/2021 Occupation: '@GUAROCC'$ @  Subjective:  No chief complaint on file.   History of Present Illness: Rhonda Brewer is a 71 y.o. female ***   Activities of Daily Living:  Patient reports morning stiffness for *** {minute/hour:19697}.   Patient {ACTIONS;DENIES/REPORTS:21021675::"Denies"} nocturnal pain.  Difficulty dressing/grooming: {ACTIONS;DENIES/REPORTS:21021675::"Denies"} Difficulty climbing stairs: {ACTIONS;DENIES/REPORTS:21021675::"Denies"} Difficulty getting out of chair: {ACTIONS;DENIES/REPORTS:21021675::"Denies"} Difficulty using hands for taps, buttons, cutlery, and/or writing: {ACTIONS;DENIES/REPORTS:21021675::"Denies"}  No Rheumatology ROS completed.   PMFS History:  Patient Active Problem List   Diagnosis Date Noted   Family history of diabetes mellitus (DM) 12/07/2020   De Quervain's tenosynovitis, left 12/07/2020   Preventative health care 12/01/2019   Constipation 11/12/2018   Carpal tunnel syndrome 05/29/2017   Low back pain without sciatica 04/29/2017   Screening for breast cancer 09/04/2016   H/O cardiac radiofrequency ablation 06/26/2016   CAD (coronary artery disease), native coronary artery 06/19/2016   History of hypothyroidism 06/19/2016   History of uterine cancer 06/19/2016   Scleroderma ([place]) 06/16/2016   High risk medication use 06/16/2016   Osteopenia of multiple sites 06/16/2016    Past Medical History:  Diagnosis Date   Allergy    Arthritis    CAD (coronary artery disease)    a. DES to RCA 11/2015   Cancer [facility])    Uterine 2008   CHF (congestive heart failure) (HCC)    Heart murmur    Hyperlipidemia    Osteoporosis    Thyroid disease     Family History  Problem Relation Age of Onset   Asthma Maternal Aunt    Heart disease Mother     Diabetes Mother    Heart disease Father    Stomach cancer Brother    Diabetes Brother    Colon cancer Neg Hx    Esophageal cancer Neg Hx    Rectal cancer Neg Hx    Past Surgical History:  Procedure Laterality Date   ABDOMINAL HYSTERECTOMY     APPENDECTOMY     CARDIAC ELECTROPHYSIOLOGY MAPPING AND ABLATION  04/19/2003   CARPAL TUNNEL RELEASE Right 10/15/2017   CORONARY ANGIOPLASTY WITH STENT PLACEMENT     RIGHT/LEFT HEART CATH AND CORONARY ANGIOGRAPHY [name] 08/14/2017   Procedure: RIGHT/LEFT HEART CATH AND CORONARY ANGIOGRAPHY;  Surgeon: Jolaine Artist, MD;  Location: [place] [facility];  Service: Cardiovascular;  Laterality: [name];   ULTRASOUND GUIDANCE FOR VASCULAR ACCESS  08/14/2017   Procedure: Ultrasound Guidance For Vascular Access;  Surgeon: Jolaine Artist, MD;  Location: [place] [facility];  Service: Cardiovascular;;   Social History   Social History Narrative   Lives in home with husband. Daughters live out of state. Step-daughter in [place]. Has 8 grandchildren. Lives in 2 level home but most living is on first floor. Stairs have handrails, no problem with stairs. No grab bars in bathroom. No tripping hazards. Smoke alarms present.   No pets.    Eats a good variety of foods, husband is a cook. Eats meats, fruits, vegetables. Drinks water mostly. Occasional tea.   Goes to gym 5 times a week. Enjoys reading, sewing, movies. Walking when weather is nice.    Wears seat belt in car.  Immunization History  Administered Date(s) Administered   Fluad Quad(high Dose 65+) 11/29/2019, 12/07/2020   Influenza Split 01/17/2016   Influenza,inj,Quad PF,6+ Mos 11/12/2018   Influenza-Unspecified 04/02/2017, 12/08/2017   Moderna Covid-19 Vaccine Bivalent Booster 64yr & up 01/31/2021   Moderna SARS-COV2 Booster Vaccination 01/13/2020, 09/25/2020   Moderna Sars-Covid-2 Vaccination 04/09/2019, 05/14/2019   Pneumococcal Conjugate-13 10/26/2014   Pneumococcal Polysaccharide-23 11/12/2018    Pneumococcal-Unspecified 09/14/2013   Td 07/18/2011     Objective: Vital Signs: There were no vitals taken for this visit.   Physical Exam   Musculoskeletal Exam: ***  CDAI Exam: CDAI Score: -- Patient Global: --; Provider Global: -- Swollen: --; Tender: -- Joint Exam 07/12/2021   No joint exam has been documented for this visit   There is currently no information documented on the homunculus. Go to the Rheumatology activity and complete the homunculus joint exam.  Investigation: No additional findings.  Imaging: No results found.  Recent Labs: Lab Results  Component Value Date   WBC 6.8 01/11/2021   HGB 13.4 01/11/2021   PLT 205 01/11/2021   NA 139 01/11/2021   K 4.1 01/11/2021   CL 107 01/11/2021   CO2 22 01/11/2021   GLUCOSE 93 01/11/2021   BUN 18 01/11/2021   CREATININE 0.77 01/11/2021   BILITOT 0.5 01/11/2021   ALKPHOS 83 12/05/2020   AST 21 01/11/2021   ALT 15 01/11/2021   PROT 6.6 01/11/2021   ALBUMIN 4.2 12/05/2020   CALCIUM 9.5 01/11/2021   GFRAA 78 07/11/2020    Speciality Comments: PLQ Eye Exam: 08/07/17 PLQ toxcity,  PLQ discotinued @ [facility]  Procedures:  No procedures performed Allergies: Avelox [moxifloxacin hcl in nacl] and Other   Assessment / Plan:     Visit Diagnoses: Scleroderma ([place]  High risk medication use  Raynaud's phenomenon without gangrene  History of uterine cancer  History of coronary artery disease  H/O cardiac radiofrequency ablation  History of hypothyroidism  Osteopenia of multiple sites  Thrombocytopenia (HCC)  Trigger finger, right middle finger  Trigger finger, right ring finger  Trigger middle finger of left hand  Pes anserinus bursitis of left knee  Orders: No orders of the defined types were placed in this encounter.  No orders of the defined types were placed in this encounter.   Face-to-face time spent with patient was *** minutes. Greater than 50% of time was spent in counseling  and coordination of care.  Follow-Up Instructions: No follow-ups on file.   TOfilia Neas PA-C  Note - This record has been created using Dragon software.  Chart creation errors have been sought, but may not always  have been located. Such creation errors do not reflect on  the standard of medical care.

## 2021-07-02 ENCOUNTER — Encounter: Payer: Self-pay | Admitting: Physical Therapy

## 2021-07-02 ENCOUNTER — Ambulatory Visit: Payer: Medicare Other | Admitting: Physical Therapy

## 2021-07-02 DIAGNOSIS — M6281 Muscle weakness (generalized): Secondary | ICD-10-CM | POA: Diagnosis not present

## 2021-07-02 DIAGNOSIS — M62838 Other muscle spasm: Secondary | ICD-10-CM

## 2021-07-02 NOTE — Therapy (Signed)
?OUTPATIENT PHYSICAL THERAPY TREATMENT NOTE ? ? ?Patient Name: Rhonda Brewer ?MRN: [id number] ?DOB:08-20-1950, 71 y.o., female ?Today's Date: 07/02/2021 ? ?PCP: [name], MD ?REFERRING PROVIDER: [name], MD ? ? PT End of Session - 07/02/21 1403   ? ? Visit Number 5   ? Date for PT Re-Evaluation 08/20/21   ? Authorization Type UHC   ? PT Start Time 1400   ? PT Stop Time 1440   ? PT Time Calculation (min) 40 min   ? Activity Tolerance Patient tolerated treatment well   ? Behavior During Therapy [facility] for tasks assessed/performed   ? ?  ?  ? ?  ? ? ? ? ? ?Past Medical History:  ?Diagnosis Date  ? Allergy   ? Arthritis   ? CAD (coronary artery disease)   ? a. DES to RCA 11/2015  ? Cancer [facility])   ? Uterine 2008  ? CHF (congestive heart failure) ([place])   ? Heart murmur   ? Hyperlipidemia   ? Osteoporosis   ? Thyroid disease   ? ?Past Surgical History:  ?Procedure Laterality Date  ? ABDOMINAL HYSTERECTOMY    ? APPENDECTOMY    ? CARDIAC ELECTROPHYSIOLOGY MAPPING AND ABLATION  04/19/2003  ? CARPAL TUNNEL RELEASE Right 10/15/2017  ? CORONARY ANGIOPLASTY WITH STENT PLACEMENT    ? RIGHT/LEFT HEART CATH AND CORONARY ANGIOGRAPHY [name] 08/14/2017  ? Procedure: RIGHT/LEFT HEART CATH AND CORONARY ANGIOGRAPHY;  Surgeon: Jolaine Artist, MD;  Location: [place] [facility];  Service: Cardiovascular;  Laterality: [name];  ? ULTRASOUND GUIDANCE FOR VASCULAR ACCESS  08/14/2017  ? Procedure: Ultrasound Guidance For Vascular Access;  Surgeon: Jolaine Artist, MD;  Location: [place] [facility];  Service: Cardiovascular;;  ? ?Patient Active Problem List  ? Diagnosis Date Noted  ? Family history of diabetes mellitus (DM) 12/07/2020  ? De Quervain's tenosynovitis, left 12/07/2020  ? Preventative health care 12/01/2019  ? Constipation 11/12/2018  ? Carpal tunnel syndrome 05/29/2017  ? Low back pain without sciatica 04/29/2017  ? Screening for breast cancer 09/04/2016  ? H/O cardiac radiofrequency ablation 06/26/2016  ? CAD  (coronary artery disease), native coronary artery 06/19/2016  ? History of hypothyroidism 06/19/2016  ? History of uterine cancer 06/19/2016  ? Scleroderma ([place]) 06/16/2016  ? High risk medication use 06/16/2016  ? Osteopenia of multiple sites 06/16/2016  ? ? ?REFERRING DIAG: N30.10 (ICD-10-CM) - Interstitial cystitis (chronic) without hematuria ? ?THERAPY DIAG:  ?Other muscle spasm ? ?Muscle weakness (generalized) ? ?PERTINENT HISTORY:  ? ?PRECAUTIONS: none ? ?SUBJECTIVE: I felt better last week, just barely felt like there were times I didn't feel it.  So that was good. ? ?PAIN:  ?Are you having pain? Yes: NPRS scale: 0/10 ?Pain location: bladder ?Pain description: pressure ? ? ? ? ?OBJECTIVE:  ?  ?  ? ?  ?PALPATION: ?Internal Pelvic Floor levators tight Rt>Lt ?  ?External Perineal Exam normal ?  ?GENERAL high tone and difficulty relaxing ?  ?  ?LE MMT: ?  ?MMT Right ?05/29/2021 Left ?05/29/2021  ?Hip flexion      ?Hip extension      ?Hip abduction      ?Hip adduction 4/5 4/5  ?Hip internal rotation      ?Hip external rotation      ?Knee flexion      ?Knee extension      ?Ankle dorsiflexion      ?Ankle plantarflexion      ?Ankle inversion      ?  Ankle eversion      ?  ?PELVIC MMT: ?  ?MMT   ?05/29/2021  ?Vaginal 3/5  ?Internal Anal Sphincter    ?External Anal Sphincter    ?Puborectalis    ?Diastasis Recti No  ?(Blank rows = not tested) ?  ?  ?TONE: ?Difficutly relaxing after contracting ?  ?PROLAPSE: ?None noticeable in supine ?  ?LUMBAR SPECIAL TESTS:  ?Straight leg raise test: Positive Rt side easier ASLR with compression and pelvic stability ?  ?FUNCTIONAL TESTS:  ?SLS reverse trendelenburg mild - no UE support needed x 8 sec ? ?  ?  ?  ?TODAY'S TREATMENT  ?Treatment: 2021-07-24 ?Exercise: ? ?Alt UE/LE dead bug - 10x ?Dead bug with presses 5 sec x10 ? ?Manual ?MFR to abdomen around umbilicus and bladder, kidneys, liver ?STM to thoracic paraspinals ? ? ?Treatment: 06/26/21 ?Exercise: ?Core with exhale on exertion -  10x ?Tapping toes - 10x ?UE overhead - 10x ?Alt UE/LE dead bug - 10x ?Palpating pelvis and ensure pelvic alignment symmetry ? ?Manual ?MFR to abdomen around umbilicus and bladder  ?Patient confirms identification and approves physical therapist to perform internal soft tissue work : internal to levators with breathing and bulging Rt>Lt ? ?Treatment: 06/12/21 ?Exercises  ?Woodpeckers with 4lb 2x10 ?Abduction 2x10 ?Adduction 2x10 ?Squat with green band 15x ?MET to self to correct anterior hip rotation ? ?Manual ?MFR to abdomen around umbilicus and bladder in all 6 planes ? ? ? ?Treatment:06/05/21 ?Exercises  ?LE march and tap - 20x ?Side lying hip abduction 20x ?Child pose hip ER and IR ?Chlid pose with threading ? ?Manual ?Patient confirms identification and approves physical therapist to perform internal soft tissue work   ?Internal STM levators Rt>Lt ? ?Nuero Re-ed ?Education and cues for coordination of breathing and pelvic floor muscle contracting and relaxing at appropriate times ?Correcting Lt anterior pelvic tilt ? ?Therapeutic activities ?  ?PATIENT EDUCATION:  ?Education details: Access Code: [email or web address] ?Person educated: Patient ?Education method: Explanation, Demonstration, Tactile cues, Verbal cues, and Handouts ?Education comprehension: verbalized understanding and returned demonstration ?  ?  ?HOME EXERCISE PROGRAM: ?Added muscle energy techniques ?  ?ASSESSMENT: ?  ?CLINICAL IMPRESSION: ?Pt is much better.  Still a lot of extension in thoracic spine.  Today's session focused on soft tissue releases to decrease tone in back muscles and activate core especially obliques. Improved core activation after STM and with back slightly elevated during STM.  Pt still benefits from skilled PT to continue with soft tissue flexibility and core strength ?  ?  ?  ?OBJECTIVE IMPAIRMENTS decreased activity tolerance, decreased coordination, decreased endurance, difficulty walking, decreased strength, increased fascial  restrictions, increased muscle spasms, and pain.  ?  ?ACTIVITY LIMITATIONS community activity and toileting .  ?  ?PERSONAL FACTORS 1 comorbidity: IC  are also affecting patient's functional outcome.  ?  ?  ?REHAB POTENTIAL: Excellent ?  ?CLINICAL DECISION MAKING: Evolving/moderate complexity ?  ?EVALUATION COMPLEXITY: Moderate ?  ?  ?GOALS: ?Goals reviewed with patient? Yes ?  ?SHORT TERM GOALS: Target date: 06/25/21 ?  ?Pt will be ind with initial HEP ?Baseline:  ?Goal status: MET  ?  ?LONG TERM GOALS: Target date: 08/20/21 ?  ?Pt will be independent with advanced HEP to maintain improvements made throughout therapy ?  ?Baseline:  ? ?Goal status:Ongoing  ?  ?2.  Pt will report 75% reduction of pain due to improvements in posture, strength, and muscle length  ?Baseline: 98% 06/26/21 ? ?Goal status: Achieved 06/26/21 ? ?  ?3.  Pt will be able to workout for 45 minutes without increased discomfort  ?Baseline:  ?Goal status: MET 07/02/21 ? ?  ?4.  Pt will report normal bladder emptying due to improved soft tissue length ?Baseline:  ?Goal status: Met - 07/02/21 ? ?  ?  ?  ?  ?PLAN: ?PT FREQUENCY: 1x/week ?  ?PT DURATION: 12 weeks ?  ?PLANNED INTERVENTIONS: Therapeutic exercises, Therapeutic activity, Neuromuscular re-education, Balance training, Gait training, Patient/Family education, Joint mobilization, Dry Needling, Electrical stimulation, Cryotherapy, Moist heat, Taping, Biofeedback, and Manual therapy ?  ?PLAN FOR NEXT SESSION: follow up on core strength to obliques; MFR as needed ?Jule Ser, PT ?07/02/2021, 2:49 PM ? ?   ?

## 2021-07-10 ENCOUNTER — Encounter: Payer: Medicare Other | Admitting: Physical Therapy

## 2021-07-12 ENCOUNTER — Ambulatory Visit: Payer: Medicare Other | Admitting: Physician Assistant

## 2021-07-12 DIAGNOSIS — M65332 Trigger finger, left middle finger: Secondary | ICD-10-CM

## 2021-07-12 DIAGNOSIS — Z9889 Other specified postprocedural states: Secondary | ICD-10-CM

## 2021-07-12 DIAGNOSIS — Z8679 Personal history of other diseases of the circulatory system: Secondary | ICD-10-CM

## 2021-07-12 DIAGNOSIS — Z8639 Personal history of other endocrine, nutritional and metabolic disease: Secondary | ICD-10-CM

## 2021-07-12 DIAGNOSIS — M8589 Other specified disorders of bone density and structure, multiple sites: Secondary | ICD-10-CM

## 2021-07-12 DIAGNOSIS — I7300 Raynaud's syndrome without gangrene: Secondary | ICD-10-CM

## 2021-07-12 DIAGNOSIS — D696 Thrombocytopenia, unspecified: Secondary | ICD-10-CM

## 2021-07-12 DIAGNOSIS — M65341 Trigger finger, right ring finger: Secondary | ICD-10-CM

## 2021-07-12 DIAGNOSIS — Z8542 Personal history of malignant neoplasm of other parts of uterus: Secondary | ICD-10-CM

## 2021-07-12 DIAGNOSIS — M65331 Trigger finger, right middle finger: Secondary | ICD-10-CM

## 2021-07-12 DIAGNOSIS — Z79899 Other long term (current) drug therapy: Secondary | ICD-10-CM

## 2021-07-12 DIAGNOSIS — M349 Systemic sclerosis, unspecified: Secondary | ICD-10-CM

## 2021-07-12 DIAGNOSIS — M7052 Other bursitis of knee, left knee: Secondary | ICD-10-CM

## 2021-07-12 NOTE — Progress Notes (Signed)
? ?Office Visit Note ? ?Patient: Rhonda Brewer             ?Date of Birth: 1951/01/15           ?MRN: [id number]             ?PCP: [name], MD ?Referring: [name], MD ?Visit Date: 07/26/2021 ?Occupation: '@GUAROCC'$ @ ? ?Subjective:  ?Right hand pain and stiffness  ? ?History of Present Illness: Rhonda Brewer is a 71 y.o. female with history of scleroderma.  Patient is currently not taking any immunosuppressive agents.  She states that she continues to have some shortness of breath on exertion especially when climbing steps.  She has an upcoming high-resolution chest CT scheduled on 08/30/2021 and a follow-up visit with Dr. Chase Caller on 09/07/2021. ?She continues to have tightness and stiffness in both hands especially her right hand.  She states last week she had an episode of pain, swelling, and redness of the right fifth MCP joint.  She states that she has Voltaren gel and took Tylenol which eased her symptoms.  She denies any overuse activities triggering the onset of the symptoms.  She is not experiencing other joint pain or joint swelling at this time.  She continues to experience Raynaud's intermittently.  She denies any digital ulcerations.  She denies any other rashes.   ?She denies any recent infections.  She has not had any new medical conditions. ? ? ?Activities of Daily Living:  ?Patient reports morning stiffness for 5-10 minutes.   ?Patient Reports nocturnal pain.  ?Difficulty dressing/grooming: Denies ?Difficulty climbing stairs: Denies ?Difficulty getting out of chair: Denies ?Difficulty using hands for taps, buttons, cutlery, and/or writing: Denies ? ?Review of Systems  ?Constitutional:  Negative for fatigue.  ?HENT:  Positive for mouth dryness. Negative for mouth sores and nose dryness.   ?Eyes:  Positive for dryness. Negative for pain and itching.  ?Respiratory:  Negative for shortness of breath and difficulty breathing.   ?Cardiovascular:  Negative for chest pain and palpitations.   ?Gastrointestinal:  Negative for blood in stool, constipation and diarrhea.  ?Endocrine: Negative for increased urination.  ?Genitourinary:  Negative for difficulty urinating.  ?Musculoskeletal:  Positive for joint pain, joint pain, joint swelling and morning stiffness. Negative for myalgias, muscle tenderness and myalgias.  ?Skin:  Negative for color change, rash and redness.  ?Allergic/Immunologic: Negative for susceptible to infections.  ?Neurological:  Negative for dizziness, numbness, headaches, memory loss and weakness.  ?Hematological:  Positive for bruising/bleeding tendency.  ?Psychiatric/Behavioral:  Negative for confusion.   ? ?PMFS History:  ?Patient Active Problem List  ? Diagnosis Date Noted  ? Family history of diabetes mellitus (DM) 12/07/2020  ? De Quervain's tenosynovitis, left 12/07/2020  ? Preventative health care 12/01/2019  ? Constipation 11/12/2018  ? Carpal tunnel syndrome 05/29/2017  ? Low back pain without sciatica 04/29/2017  ? Screening for breast cancer 09/04/2016  ? H/O cardiac radiofrequency ablation 06/26/2016  ? CAD (coronary artery disease), native coronary artery 06/19/2016  ? History of hypothyroidism 06/19/2016  ? History of uterine cancer 06/19/2016  ? Scleroderma ([place]) 06/16/2016  ? High risk medication use 06/16/2016  ? Osteopenia of multiple sites 06/16/2016  ?  ?Past Medical History:  ?Diagnosis Date  ? Allergy   ? Arthritis   ? CAD (coronary artery disease)   ? a. DES to RCA 11/2015  ? Cancer [facility])   ? Uterine 2008  ? CHF (congestive heart failure) ([place])   ? Heart murmur   ?  Hyperlipidemia   ? Osteoporosis   ? Thyroid disease   ?  ?Family History  ?Problem Relation Age of Onset  ? Asthma Maternal Aunt   ? Heart disease Mother   ? Diabetes Mother   ? Heart disease Father   ? Stomach cancer Brother   ? Diabetes Brother   ? Colon cancer Neg Hx   ? Esophageal cancer Neg Hx   ? Rectal cancer Neg Hx   ? ?Past Surgical History:  ?Procedure Laterality Date  ? ABDOMINAL HYSTERECTOMY     ? APPENDECTOMY    ? CARDIAC ELECTROPHYSIOLOGY MAPPING AND ABLATION  04/19/2003  ? CARPAL TUNNEL RELEASE Right 10/15/2017  ? CORONARY ANGIOPLASTY WITH STENT PLACEMENT    ? RIGHT/LEFT HEART CATH AND CORONARY ANGIOGRAPHY [name] 08/14/2017  ? Procedure: RIGHT/LEFT HEART CATH AND CORONARY ANGIOGRAPHY;  Surgeon: Jolaine Artist, MD;  Location: [place] [facility];  Service: Cardiovascular;  Laterality: [name];  ? ULTRASOUND GUIDANCE FOR VASCULAR ACCESS  08/14/2017  ? Procedure: Ultrasound Guidance For Vascular Access;  Surgeon: Jolaine Artist, MD;  Location: [place] [facility];  Service: Cardiovascular;;  ? ?Social History  ? ?Social History Narrative  ? Lives in home with husband. Daughters live out of state. Step-daughter in [place]. Has 8 grandchildren. Lives in 2 level home but most living is on first floor. Stairs have handrails, no problem with stairs. No grab bars in bathroom. No tripping hazards. Smoke alarms present.  ? No pets.   ? Eats a good variety of foods, husband is a cook. Eats meats, fruits, vegetables. Drinks water mostly. Occasional tea.  ? Goes to gym 5 times a week. Enjoys reading, sewing, movies. Walking when weather is nice.   ? Wears seat belt in car.   ? ?Immunization History  ?Administered Date(s) Administered  ? Fluad Quad(high Dose 65+) 11/29/2019, 12/07/2020  ? Influenza Split 01/17/2016  ? Influenza,inj,Quad PF,6+ Mos 11/12/2018  ? Influenza-Unspecified 04/02/2017, 12/08/2017  ? Moderna Covid-19 Vaccine Bivalent Booster 28yr & up 01/31/2021  ? Moderna SARS-COV2 Booster Vaccination 01/13/2020, 09/25/2020  ? Moderna Sars-Covid-2 Vaccination 04/09/2019, 05/14/2019  ? Pneumococcal Conjugate-13 10/26/2014  ? Pneumococcal Polysaccharide-23 11/12/2018  ? Pneumococcal-Unspecified 09/14/2013  ? Td 07/18/2011  ?  ? ?Objective: ?Vital Signs: BP 122/76 (BP Location: Left Arm, Patient Position: Sitting, Cuff Size: Normal)   Pulse (!) 58   Ht '5\' 4"'$  (1.626 m)   Wt 142 lb 6.4 oz (64.6 kg)   BMI 24.44  kg/m?   ? ?Physical Exam ?Vitals and nursing note reviewed.  ?Constitutional:   ?   Appearance: She is well-developed.  ?HENT:  ?   Head: Normocephalic and atraumatic.  ?Eyes:  ?   Conjunctiva/sclera: Conjunctivae normal.  ?Cardiovascular:  ?   Rate and Rhythm: Normal rate and regular rhythm.  ?   Heart sounds: Normal heart sounds.  ?Pulmonary:  ?   Effort: Pulmonary effort is normal.  ?   Breath sounds: Normal breath sounds.  ?Abdominal:  ?   General: Bowel sounds are normal.  ?   Palpations: Abdomen is soft.  ?Musculoskeletal:  ?   Cervical back: Normal range of motion.  ?Skin: ?   General: Skin is warm and dry.  ?   Capillary Refill: Capillary refill takes less than 2 seconds.  ?   Comments: Sclerodactyly with hyperpigmentation's overlying fingers noted.  No digital ulcerations or signs of gangrene noted.  Good capillary refill noted.  Telangiectasias on palms noted.   ?Neurological:  ?   Mental  Status: She is alert and oriented to person, place, and time.  ?Psychiatric:     ?   Behavior: Behavior normal.  ?  ? ?Musculoskeletal Exam: C-spine, thoracic spine, and lumbar spine good ROM.  No midline spinal tenderness.  Shoulder joints, elbow joints, and wrist joints have good ROM with no discomfort. Difficulty making a complete fist due to sclerodactyly. No tenderness or synovitis of MCP joints. Hip joints have good ROM with no groin pain.  Knee joints have good ROM with no warmth or effusion.  Ankle joints have good ROM with tenderness or joint swelling.  ? ?CDAI Exam: ?CDAI Score: -- ?Patient Global: --; Provider Global: -- ?Swollen: --; Tender: -- ?Joint Exam 07/26/2021  ? ?No joint exam has been documented for this visit  ? ?There is currently no information documented on the homunculus. Go to the Rheumatology activity and complete the homunculus joint exam. ? ?Investigation: ?No additional findings. ? ?Imaging: ?No results found. ? ?Recent Labs: ?Lab Results  ?Component Value Date  ? WBC 6.8 01/11/2021  ? HGB  13.4 01/11/2021  ? PLT 205 01/11/2021  ? NA 139 01/11/2021  ? K 4.1 01/11/2021  ? CL 107 01/11/2021  ? CO2 22 01/11/2021  ? GLUCOSE 93 01/11/2021  ? BUN 18 01/11/2021  ? CREATININE 0.77 01/11/2021  ? BILITOT

## 2021-07-17 ENCOUNTER — Ambulatory Visit: Payer: Medicare Other | Attending: Obstetrics and Gynecology | Admitting: Physical Therapy

## 2021-07-17 ENCOUNTER — Encounter: Payer: Self-pay | Admitting: Physical Therapy

## 2021-07-17 DIAGNOSIS — M62838 Other muscle spasm: Secondary | ICD-10-CM | POA: Insufficient documentation

## 2021-07-17 DIAGNOSIS — M6281 Muscle weakness (generalized): Secondary | ICD-10-CM | POA: Diagnosis not present

## 2021-07-17 NOTE — Therapy (Signed)
?OUTPATIENT PHYSICAL THERAPY TREATMENT NOTE ? ? ?Patient Name: Rhonda Brewer ?MRN: [id number] ?DOB:[birth year], 71 y.o., female ?Today's Date: 07/17/2021 ? ?PCP: [name], MD ?REFERRING PROVIDER: [name], MD ? ? PT End of Session - 07/17/21 1450   ? ? Visit Number 6   ? Date for PT Re-Evaluation 08/20/21   ? Authorization Type UHC   ? PT Start Time 9379   ? PT Stop Time 0240   ? PT Time Calculation (min) 40 min   ? Activity Tolerance Patient tolerated treatment well   ? Behavior During Therapy [facility] for tasks assessed/performed   ? ?  ?  ? ?  ? ? ? ? ? ? ?Past Medical History:  ?Diagnosis Date  ? Allergy   ? Arthritis   ? CAD (coronary artery disease)   ? a. DES to RCA 11/2015  ? Cancer [facility])   ? Uterine 2008  ? CHF (congestive heart failure) ([place])   ? Heart murmur   ? Hyperlipidemia   ? Osteoporosis   ? Thyroid disease   ? ?Past Surgical History:  ?Procedure Laterality Date  ? ABDOMINAL HYSTERECTOMY    ? APPENDECTOMY    ? CARDIAC ELECTROPHYSIOLOGY MAPPING AND ABLATION  04/19/2003  ? CARPAL TUNNEL RELEASE Right 10/15/2017  ? CORONARY ANGIOPLASTY WITH STENT PLACEMENT    ? RIGHT/LEFT HEART CATH AND CORONARY ANGIOGRAPHY [name] 08/14/2017  ? Procedure: RIGHT/LEFT HEART CATH AND CORONARY ANGIOGRAPHY;  Surgeon: Jolaine Artist, MD;  Location: [place] CV LAB;  Service: Cardiovascular;  Laterality: [name];  ? ULTRASOUND GUIDANCE FOR VASCULAR ACCESS  08/14/2017  ? Procedure: Ultrasound Guidance For Vascular Access;  Surgeon: Jolaine Artist, MD;  Location: [place] CV LAB;  Service: Cardiovascular;;  ? ?Patient Active Problem List  ? Diagnosis Date Noted  ? Family history of diabetes mellitus (DM) 12/07/2020  ? De Quervain's tenosynovitis, left 12/07/2020  ? Preventative health care 12/01/2019  ? Constipation 11/12/2018  ? Carpal tunnel syndrome 05/29/2017  ? Low back pain without sciatica 04/29/2017  ? Screening for breast cancer 09/04/2016  ? H/O cardiac radiofrequency ablation 06/26/2016  ? CAD  (coronary artery disease), native coronary artery 06/19/2016  ? History of hypothyroidism 06/19/2016  ? History of uterine cancer 06/19/2016  ? Scleroderma ([place]) 06/16/2016  ? High risk medication use 06/16/2016  ? Osteopenia of multiple sites 06/16/2016  ? ? ?REFERRING DIAG: N30.10 (ICD-10-CM) - Interstitial cystitis (chronic) without hematuria ? ?THERAPY DIAG:  ?Other muscle spasm ? ?Muscle weakness (generalized) ? ?PERTINENT HISTORY:  ? ?PRECAUTIONS: none ? ?SUBJECTIVE: I have been feeling good and no pain. ? ?PAIN:  ?Are you having pain? Yes: NPRS scale: 0/10 ?Pain location: bladder ?Pain description: pressure ? ? ? ? ?OBJECTIVE:  ?  ?  ? ?  ?PALPATION: ?Internal Pelvic Floor levators tight Rt>Lt ?  ?External Perineal Exam normal ?  ?GENERAL high tone and difficulty relaxing ?  ?  ?LE MMT: ?  ?MMT Right ?05/29/2021 Left ?05/29/2021  ?Hip flexion      ?Hip extension      ?Hip abduction      ?Hip adduction 4/5 4/5  ?Hip internal rotation      ?Hip external rotation      ?Knee flexion      ?Knee extension      ?Ankle dorsiflexion      ?Ankle plantarflexion      ?Ankle inversion      ?Ankle eversion      ?  ?PELVIC MMT: ?  ?  MMT   ?05/29/2021  ?Vaginal 3/5  ?Internal Anal Sphincter    ?External Anal Sphincter    ?Puborectalis    ?Diastasis Recti No  ?(Blank rows = not tested) ?  ?  ?TONE: ?Difficutly relaxing after contracting ?  ?PROLAPSE: ?None noticeable in supine ?  ?LUMBAR SPECIAL TESTS:  ?Straight leg raise test: Positive Rt side easier ASLR with compression and pelvic stability ?  ?FUNCTIONAL TESTS:  ?SLS reverse trendelenburg mild - no UE support needed x 8 sec ? ?  ?  ?  ?TODAY'S TREATMENT  ?Treatment: 07/17/21 ?Exercise: ?Pallof series with bands - 10x each ?Isometric core with band walking ?Half kneel rotation and lifting both ways - blue weighted ball - 10x each way ?Sidelying hip abd/add - 2lb - 2x10 ?Woodpecker with 3lb 10x each side ?Buttefly ?Weighted ball overhead 10 sec hold x 5 ?Foam roll under pelvis  alt LE ext - 10x ? ? ? ?Treatment: 07/14/21 ?Exercise: ? ?Alt UE/LE dead bug - 10x ?Dead bug with presses 5 sec x10 ? ?Manual ?MFR to abdomen around umbilicus and bladder, kidneys, liver ?STM to thoracic paraspinals ? ? ?Treatment: 06/26/21 ?Exercise: ?Core with exhale on exertion - 10x ?Tapping toes - 10x ?UE overhead - 10x ?Alt UE/LE dead bug - 10x ?Palpating pelvis and ensure pelvic alignment symmetry ? ?Manual ?MFR to abdomen around umbilicus and bladder  ?Patient confirms identification and approves physical therapist to perform internal soft tissue work : internal to levators with breathing and bulging Rt>Lt ? ? ?  ?PATIENT EDUCATION:  ?Education details: Access Code: [email or web address] ?Person educated: Patient ?Education method: Explanation, Demonstration, Tactile cues, Verbal cues, and Handouts ?Education comprehension: verbalized understanding and returned demonstration ?  ?  ?HOME EXERCISE PROGRAM: ? ?  ?ASSESSMENT: ?  ?CLINICAL IMPRESSION: ?Pt is much better.  Pt has met all remaining goals and is recommended to d/c with HEP today. ?  ?  ?  ?OBJECTIVE IMPAIRMENTS decreased activity tolerance, decreased coordination, decreased endurance, difficulty walking, decreased strength, increased fascial restrictions, increased muscle spasms, and pain.  ?  ?ACTIVITY LIMITATIONS community activity and toileting .  ?  ?PERSONAL FACTORS 1 comorbidity: IC  are also affecting patient's functional outcome.  ?  ?  ?REHAB POTENTIAL: Excellent ?  ?CLINICAL DECISION MAKING: Evolving/moderate complexity ?  ?EVALUATION COMPLEXITY: Moderate ?  ?  ?GOALS: ?Goals reviewed with patient? Yes ?  ?SHORT TERM GOALS: Target date: 06/25/21 ?  ?Pt will be ind with initial HEP ?Baseline:  ?Goal status: MET  ?  ?LONG TERM GOALS: Target date: 08/20/21 ?  ?Pt will be independent with advanced HEP to maintain improvements made throughout therapy ?  ?Baseline:  ? ?Goal status: MET ?  ?2.  Pt will report 75% reduction of pain due to improvements in posture,  strength, and muscle length  ?Baseline: 98% 06/26/21 ? ?Goal status: Achieved 06/26/21 ? ?  ?3.  Pt will be able to workout for 45 minutes without increased discomfort  ?Baseline:  ?Goal status: MET 07/14/21 ? ?  ?4.  Pt will report normal bladder emptying due to improved soft tissue length ?Baseline:  ?Goal status: Met - 2021-07-14 ? ?  ?  ?  ?  ?PLAN: ?PT FREQUENCY: 1x/week ?  ?PT DURATION: 12 weeks ?  ?PLANNED INTERVENTIONS: Therapeutic exercises, Therapeutic activity, Neuromuscular re-education, Balance training, Gait training, Patient/Family education, Joint mobilization, Dry Needling, Electrical stimulation, Cryotherapy, Moist heat, Taping, Biofeedback, and Manual therapy ?  ?PLAN FOR NEXT SESSION: d/c ?PHYSICAL THERAPY DISCHARGE SUMMARY ? ?Visits  from Start of Care: 6 ? ?Current functional level related to goals / functional outcomes: ?Goals met as seen above ?  ?Remaining deficits: ?See above details ?  ?Education / Equipment: ?HEP  ? ?Patient agrees to discharge. Patient goals were met. Patient is being discharged due to meeting the stated rehab goals. ? ?Jule Ser, PT ?07/17/2021, 2:51 PM ? ?   ?

## 2021-07-26 ENCOUNTER — Ambulatory Visit: Payer: Medicare Other | Admitting: Physician Assistant

## 2021-07-26 ENCOUNTER — Encounter: Payer: Self-pay | Admitting: Physician Assistant

## 2021-07-26 VITALS — BP 122/76 | HR 58 | Ht 64.0 in | Wt 142.4 lb

## 2021-07-26 DIAGNOSIS — M8589 Other specified disorders of bone density and structure, multiple sites: Secondary | ICD-10-CM | POA: Diagnosis not present

## 2021-07-26 DIAGNOSIS — Z8542 Personal history of malignant neoplasm of other parts of uterus: Secondary | ICD-10-CM | POA: Diagnosis not present

## 2021-07-26 DIAGNOSIS — M65332 Trigger finger, left middle finger: Secondary | ICD-10-CM

## 2021-07-26 DIAGNOSIS — D696 Thrombocytopenia, unspecified: Secondary | ICD-10-CM

## 2021-07-26 DIAGNOSIS — Z79899 Other long term (current) drug therapy: Secondary | ICD-10-CM | POA: Diagnosis not present

## 2021-07-26 DIAGNOSIS — M349 Systemic sclerosis, unspecified: Secondary | ICD-10-CM | POA: Diagnosis not present

## 2021-07-26 DIAGNOSIS — Z8639 Personal history of other endocrine, nutritional and metabolic disease: Secondary | ICD-10-CM | POA: Diagnosis not present

## 2021-07-26 DIAGNOSIS — M7052 Other bursitis of knee, left knee: Secondary | ICD-10-CM | POA: Diagnosis not present

## 2021-07-26 DIAGNOSIS — Z8679 Personal history of other diseases of the circulatory system: Secondary | ICD-10-CM | POA: Diagnosis not present

## 2021-07-26 DIAGNOSIS — M65331 Trigger finger, right middle finger: Secondary | ICD-10-CM

## 2021-07-26 DIAGNOSIS — I7300 Raynaud's syndrome without gangrene: Secondary | ICD-10-CM

## 2021-07-26 DIAGNOSIS — M65341 Trigger finger, right ring finger: Secondary | ICD-10-CM

## 2021-07-26 DIAGNOSIS — Z9889 Other specified postprocedural states: Secondary | ICD-10-CM

## 2021-07-27 DIAGNOSIS — R300 Dysuria: Secondary | ICD-10-CM | POA: Diagnosis not present

## 2021-07-27 LAB — COMPLETE METABOLIC PANEL WITH GFR
AG Ratio: 1.8 (calc) (ref 1.0–2.5)
ALT: 14 U/L (ref 6–29)
AST: 20 U/L (ref 10–35)
Albumin: 4.2 g/dL (ref 3.6–5.1)
Alkaline phosphatase (APISO): 74 U/L (ref 37–153)
BUN: 13 mg/dL (ref 7–25)
CO2: 24 mmol/L (ref 20–32)
Calcium: 9.4 mg/dL (ref 8.6–10.4)
Chloride: 108 mmol/L (ref 98–110)
Creat: 0.85 mg/dL (ref 0.60–1.00)
Globulin: 2.3 g/dL (calc) (ref 1.9–3.7)
Glucose, Bld: 82 mg/dL (ref 65–99)
Potassium: 4.1 mmol/L (ref 3.5–5.3)
Sodium: 140 mmol/L (ref 135–146)
Total Bilirubin: 0.5 mg/dL (ref 0.2–1.2)
Total Protein: 6.5 g/dL (ref 6.1–8.1)
eGFR: 74 mL/min/{1.73_m2} (ref >=60)

## 2021-07-27 LAB — CBC WITH DIFFERENTIAL/PLATELET
Absolute Monocytes: 561 cells/uL (ref 200–950)
Basophils Absolute: 32 cells/uL (ref 0–200)
Basophils Relative: 0.5 %
Eosinophils Absolute: 183 cells/uL (ref 15–500)
Eosinophils Relative: 2.9 %
HCT: 41.2 % (ref 35.0–45.0)
Hemoglobin: 13.6 g/dL (ref 11.7–15.5)
Lymphs Abs: 1922 cells/uL (ref 850–3900)
MCH: 32.1 pg (ref 27.0–33.0)
MCHC: 33 g/dL (ref 32.0–36.0)
MCV: 97.2 fL (ref 80.0–100.0)
MPV: 10.3 fL (ref 7.5–12.5)
Monocytes Relative: 8.9 %
Neutro Abs: 3604 cells/uL (ref 1500–7800)
Neutrophils Relative %: 57.2 %
Platelets: 212 10*3/uL (ref 140–400)
RBC: 4.24 10*6/uL (ref 3.80–5.10)
RDW: 12.4 % (ref 11.0–15.0)
Total Lymphocyte: 30.5 %
WBC: 6.3 10*3/uL (ref 3.8–10.8)

## 2021-07-27 LAB — URINALYSIS, ROUTINE W REFLEX MICROSCOPIC
Bilirubin Urine: NEGATIVE
Glucose, UA: NEGATIVE
Hgb urine dipstick: NEGATIVE
Hyaline Cast: NONE SEEN /LPF
Ketones, ur: NEGATIVE
Nitrite: NEGATIVE
Protein, ur: NEGATIVE
RBC / HPF: NONE SEEN /HPF (ref 0–2)
Specific Gravity, Urine: 1.01 (ref 1.001–1.035)
Squamous Epithelial / HPF: NONE SEEN /HPF (ref <=5)
pH: 5.5 (ref 5.0–8.0)

## 2021-07-27 LAB — MICROSCOPIC MESSAGE

## 2021-07-27 NOTE — Progress Notes (Signed)
UA revealed trace leukocytes and few bacteria.  Negative for nitrites.  Please clarify if she is experiencing any signs or symptoms of a UTI.  If so she should follow up with her PCP for further evaluation.   ?CBC and CMP WNL.

## 2021-07-30 DIAGNOSIS — R300 Dysuria: Secondary | ICD-10-CM | POA: Diagnosis not present

## 2021-07-31 DIAGNOSIS — H25811 Combined forms of age-related cataract, right eye: Secondary | ICD-10-CM | POA: Diagnosis not present

## 2021-08-04 ENCOUNTER — Other Ambulatory Visit: Payer: Self-pay | Admitting: Physician Assistant

## 2021-08-07 ENCOUNTER — Ambulatory Visit (INDEPENDENT_AMBULATORY_CARE_PROVIDER_SITE_OTHER): Payer: Medicare Other | Admitting: Family Medicine

## 2021-08-07 VITALS — BP 121/59 | HR 62 | Ht 64.0 in | Wt 138.8 lb

## 2021-08-07 DIAGNOSIS — R0609 Other forms of dyspnea: Secondary | ICD-10-CM

## 2021-08-07 NOTE — Patient Instructions (Signed)
For your shortness of breath:  I believe this is a sequela of having covid scaring of your lungs.  Be sure to use your albuterol inhaler (2 puffs) as needed and before exercising at the gym.  If you become increasingly short of breath, seek urgent medical attention.    Let me know if you would like a chest xray.   Take Care

## 2021-08-07 NOTE — Progress Notes (Signed)
   SUBJECTIVE:   CHIEF COMPLAINT / HPI:   Rhonda Brewer is a 71 y.o. female here for shortness of breath after being at the gym.  She states that she increased her exercise level as she is trying to get back to her pre-COVID workout.  Patient had COVID 1 month ago.  She continues to have intermittent shortness of breath but today she started having dyspnea on exertion which is new for her.  Has a slight cough.  Denies chest pain, chest discomfort, history of smoking, lower extremity swelling, orthopnea, hemoptysis.  Endorses some fatigue.   PERTINENT  PMH / PSH: CHF, CAD, scleroderma  OBJECTIVE:   BP (!) 121/59   Pulse 62   Ht '5\' 4"'$  (1.626 m)   Wt 138 lb 12.8 oz (63 kg)   SpO2 98%   BMI 23.82 kg/m    GEN: pleasant well appearing female, in no acute distress  CV: regular rate and rhythm, no murmurs, rubs or gallops RESP: no increased work of breathing, clear to ascultation bilaterally MSK: no appreciable lower extremity edema, no calf tenderness SKIN: warm, dry    ASSESSMENT/PLAN:    Dyspnea on exertion Acute on chronic. Patient is 71 year old female with new onset dyspnea on exertion while working out at the gym today. VSS. Cardiopulmonary exam is unremarkable.  She is 1 month status post-COVID.  She has history of coronary artery disease, scleroderma and CHF.   Advised to trial use albuterol inhaler as needed prior to exercise.  Offered chest x-ray however patient declined.  She is followed by rheumatology for her scleroderma.  Dr. Haroldine Laws for CAD (s/p RCA stent). She has a schedule high-resolution CT in about 3 weeks.  Follows with Dr. Chase Caller, pulmonology, for PFTs after CT scan.  Per chart review dyspnea on exertion is not new. ED and return precautions provided.    Lyndee Hensen, DO PGY-3, [facility] 08/07/2021

## 2021-08-09 ENCOUNTER — Other Ambulatory Visit: Payer: Self-pay | Admitting: *Deleted

## 2021-08-09 MED ORDER — ALBUTEROL SULFATE HFA 108 (90 BASE) MCG/ACT IN AERS: 2.0000 | INHALATION_SPRAY | RESPIRATORY_TRACT | 6 refills | Status: AC | PRN

## 2021-08-09 NOTE — Telephone Encounter (Signed)
Patient reports that the inhaler ( albuterol) is helping since Dr. Annamarie Dawley suggested it.  She has less than she thought and will need a refill sent in.   If the patient needs:   An appointment - route response to [facility] admin  A callback from clinic staff - route response to your team   Only route to RN Team: form completions or if RN team directly requests response sent to them   Christen Bame, CMA

## 2021-08-10 ENCOUNTER — Other Ambulatory Visit (HOSPITAL_COMMUNITY): Payer: Self-pay

## 2021-08-21 ENCOUNTER — Encounter: Payer: Self-pay | Admitting: *Deleted

## 2021-08-30 ENCOUNTER — Ambulatory Visit (HOSPITAL_COMMUNITY)
Admission: RE | Admit: 2021-08-30 | Discharge: 2021-08-30 | Disposition: A | Discharge status: Home / Self Care | Payer: Medicare Other | Source: Ambulatory Visit | Attending: Internal Medicine | Admitting: Internal Medicine

## 2021-08-30 DIAGNOSIS — R0609 Other forms of dyspnea: Secondary | ICD-10-CM | POA: Diagnosis not present

## 2021-08-30 DIAGNOSIS — R0689 Other abnormalities of breathing: Secondary | ICD-10-CM | POA: Insufficient documentation

## 2021-08-30 DIAGNOSIS — R0600 Dyspnea, unspecified: Secondary | ICD-10-CM | POA: Diagnosis not present

## 2021-08-30 DIAGNOSIS — I700 Atherosclerosis of aorta: Secondary | ICD-10-CM | POA: Diagnosis not present

## 2021-08-30 DIAGNOSIS — M349 Systemic sclerosis, unspecified: Secondary | ICD-10-CM | POA: Diagnosis not present

## 2021-08-30 DIAGNOSIS — J984 Other disorders of lung: Secondary | ICD-10-CM | POA: Diagnosis not present

## 2021-08-30 DIAGNOSIS — I2510 Atherosclerotic heart disease of native coronary artery without angina pectoris: Secondary | ICD-10-CM | POA: Diagnosis not present

## 2021-09-03 ENCOUNTER — Encounter: Payer: Self-pay | Admitting: Family Medicine

## 2021-09-03 ENCOUNTER — Encounter (HOSPITAL_COMMUNITY): Payer: Self-pay | Admitting: Internal Medicine

## 2021-09-03 DIAGNOSIS — R932 Abnormal findings on diagnostic imaging of liver and biliary tract: Secondary | ICD-10-CM

## 2021-09-04 ENCOUNTER — Telehealth (HOSPITAL_COMMUNITY): Payer: Self-pay | Admitting: Vascular Surgery

## 2021-09-04 DIAGNOSIS — H25812 Combined forms of age-related cataract, left eye: Secondary | ICD-10-CM | POA: Diagnosis not present

## 2021-09-04 DIAGNOSIS — R932 Abnormal findings on diagnostic imaging of liver and biliary tract: Secondary | ICD-10-CM | POA: Insufficient documentation

## 2021-09-04 NOTE — Telephone Encounter (Signed)
Pt would like to make appt w/ db to go over ct results.. please advise

## 2021-09-05 NOTE — Telephone Encounter (Signed)
Dr Haroldine Laws discussed through [place], f/u appt sch for 7/19

## 2021-09-07 ENCOUNTER — Ambulatory Visit: Payer: Medicare Other | Admitting: Internal Medicine

## 2021-09-07 ENCOUNTER — Encounter: Payer: Self-pay | Admitting: Internal Medicine

## 2021-09-07 ENCOUNTER — Other Ambulatory Visit: Payer: Self-pay | Admitting: Family Medicine

## 2021-09-07 ENCOUNTER — Other Ambulatory Visit: Payer: Self-pay

## 2021-09-07 ENCOUNTER — Ambulatory Visit (INDEPENDENT_AMBULATORY_CARE_PROVIDER_SITE_OTHER): Payer: Medicare Other | Admitting: Internal Medicine

## 2021-09-07 VITALS — BP 128/64 | HR 62 | Ht 64.0 in | Wt 139.0 lb

## 2021-09-07 DIAGNOSIS — M349 Systemic sclerosis, unspecified: Secondary | ICD-10-CM

## 2021-09-07 DIAGNOSIS — R0689 Other abnormalities of breathing: Secondary | ICD-10-CM

## 2021-09-07 DIAGNOSIS — I2510 Atherosclerotic heart disease of native coronary artery without angina pectoris: Secondary | ICD-10-CM

## 2021-09-07 LAB — PULMONARY FUNCTION TEST
DL/VA % pred: 85 %
DL/VA: 3.53 ml/min/mmHg/L
DLCO cor % pred: 82 %
DLCO cor: 16.1 ml/min/mmHg
DLCO unc % pred: 83 %
DLCO unc: 16.2 ml/min/mmHg
FEF 25-75 Post: 2.39 L/sec
FEF 25-75 Pre: 2.06 L/sec
FEF2575-%Change-Post: 16 %
FEF2575-%Pred-Post: 126 %
FEF2575-%Pred-Pre: 108 %
FEV1-%Change-Post: 2 %
FEV1-%Pred-Post: 98 %
FEV1-%Pred-Pre: 95 %
FEV1-Post: 2.23 L
FEV1-Pre: 2.17 L
FEV1FVC-%Change-Post: 4 %
FEV1FVC-%Pred-Pre: 104 %
FEV6-%Change-Post: -2 %
FEV6-%Pred-Post: 93 %
FEV6-%Pred-Pre: 95 %
FEV6-Post: 2.69 L
FEV6-Pre: 2.75 L
FEV6FVC-%Pred-Post: 104 %
FEV6FVC-%Pred-Pre: 104 %
FVC-%Change-Post: -2 %
FVC-%Pred-Post: 89 %
FVC-%Pred-Pre: 91 %
FVC-Post: 2.69 L
FVC-Pre: 2.75 L
Post FEV1/FVC ratio: 83 %
Post FEV6/FVC ratio: 100 %
Pre FEV1/FVC ratio: 79 %
Pre FEV6/FVC Ratio: 100 %
RV % pred: 93 %
RV: 2.05 L
TLC % pred: 97 %
TLC: 4.91 L

## 2021-09-14 ENCOUNTER — Ambulatory Visit
Admission: RE | Admit: 2021-09-14 | Discharge: 2021-09-14 | Disposition: A | Discharge status: Home / Self Care | Payer: Medicare Other | Source: Ambulatory Visit | Attending: Family Medicine | Admitting: Family Medicine

## 2021-09-14 DIAGNOSIS — R932 Abnormal findings on diagnostic imaging of liver and biliary tract: Secondary | ICD-10-CM

## 2021-09-14 DIAGNOSIS — R9389 Abnormal findings on diagnostic imaging of other specified body structures: Secondary | ICD-10-CM | POA: Diagnosis not present

## 2021-10-03 ENCOUNTER — Encounter (HOSPITAL_COMMUNITY): Payer: Self-pay | Admitting: Internal Medicine

## 2021-10-03 ENCOUNTER — Ambulatory Visit (HOSPITAL_COMMUNITY)
Admission: RE | Admit: 2021-10-03 | Discharge: 2021-10-03 | Disposition: A | Discharge status: Home / Self Care | Payer: Medicare Other | Source: Ambulatory Visit | Attending: Internal Medicine | Admitting: Internal Medicine

## 2021-10-03 VITALS — BP 108/64 | HR 73 | Wt 140.8 lb

## 2021-10-03 DIAGNOSIS — Z79899 Other long term (current) drug therapy: Secondary | ICD-10-CM | POA: Insufficient documentation

## 2021-10-03 DIAGNOSIS — Z7982 Long term (current) use of aspirin: Secondary | ICD-10-CM | POA: Insufficient documentation

## 2021-10-03 DIAGNOSIS — I509 Heart failure, unspecified: Secondary | ICD-10-CM | POA: Diagnosis not present

## 2021-10-03 DIAGNOSIS — M349 Systemic sclerosis, unspecified: Secondary | ICD-10-CM | POA: Diagnosis not present

## 2021-10-03 DIAGNOSIS — Z955 Presence of coronary angioplasty implant and graft: Secondary | ICD-10-CM | POA: Diagnosis not present

## 2021-10-03 DIAGNOSIS — I110 Hypertensive heart disease with heart failure: Secondary | ICD-10-CM | POA: Diagnosis not present

## 2021-10-03 DIAGNOSIS — I2510 Atherosclerotic heart disease of native coronary artery without angina pectoris: Secondary | ICD-10-CM | POA: Diagnosis not present

## 2021-10-03 DIAGNOSIS — Z0000 Encounter for general adult medical examination without abnormal findings: Secondary | ICD-10-CM | POA: Insufficient documentation

## 2021-10-03 LAB — COMPREHENSIVE METABOLIC PANEL
ALT: 16 U/L (ref 0–44)
AST: 19 U/L (ref 15–41)
Albumin: 4.1 g/dL (ref 3.5–5.0)
Alkaline Phosphatase: 68 U/L (ref 38–126)
Anion gap: 7 (ref 5–15)
BUN: 13 mg/dL (ref 8–23)
CO2: 22 mmol/L (ref 22–32)
Calcium: 9.6 mg/dL (ref 8.9–10.3)
Chloride: 109 mmol/L (ref 98–111)
Creatinine, Ser: 0.74 mg/dL (ref 0.44–1.00)
GFR, Estimated: >60 mL/min (ref >60)
Glucose, Bld: 91 mg/dL (ref 70–99)
Potassium: 4 mmol/L (ref 3.5–5.1)
Sodium: 138 mmol/L (ref 135–145)
Total Bilirubin: 0.7 mg/dL (ref 0.3–1.2)
Total Protein: 6.9 g/dL (ref 6.5–8.1)

## 2021-10-03 LAB — LIPID PANEL
Cholesterol: 176 mg/dL (ref 0–200)
HDL: 76 mg/dL (ref >40)
LDL Cholesterol: 86 mg/dL (ref 0–99)
Total CHOL/HDL Ratio: 2.3 RATIO
Triglycerides: 71 mg/dL (ref <150)
VLDL: 14 mg/dL (ref 0–40)

## 2021-10-03 NOTE — Progress Notes (Signed)
PCP: Dr. Andria Frames Referring: Dr. Kirke Corin    HPI:  Rhonda Brewer is a 71 y/o woman with h/o scleroderma, CAD s/p stent 9/17 , SVT s/p ablation 2/05 referred by Dr. Patrecia Pour for screening for [facility] in setting of scleroderma.   Previously lived in [place]. 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 At time had exertional fatigue and dyspnea and arm tingling. No CP.    Cath 11/24/15 LM: mild irregs LAD: 20% mid LCX: mild irreg RCA: mRCA 99% ->Xience Alpine DES 3.5x43m  Echo 11/22/15: LVEF 55-60% Mild AI. Normal RV. Mild TR.  Echo 08/27/16: EF 60-65%,  RV normal Mild AI. No RV strain or PAH.   PFTs 08/2016 FEV1 2.11 (88%) FVC 2.86 (91%) DLCO 63%  PFTs 12/20 FEV1 2.32 (99%) FVC 2.90 (94%) DLCO 85%  PFTs 12/22 FEV1 2.05L (90%) FVC 2.63L (87%) DLCO 76%  PFTs 6/23  FEV1 2.17 (95%) FVC 2.75 (91%) DLCO 82%  She was seen in the HF clinic for initial evaluation in June 2018. It was felt that her dyspnea could be due to Brilinta. She was switched to Plavix. Chest CT was ordered and showed no interstitial lung disease.   Cath 5/19 which showed very mild CAD. No evidence. There was a small step-up in saturations at RA level suggestive of anomalous pulmonary vein or shunt at atrial level   Mid RCA lesion is 20% stenosed. Prox LAD lesion is 40% stenosed.   Repeat hi-res CT on 09/07/21. No ILD.  3v coronary calcium   Here for routine f/u. Doing great. Going to gym regularly and walking 2-3 miles/day at brisk pace with no CP or undue SOB. On atorva 80. Last LDL 9/22 was 76     Studies:  Echo 2/23 : 55-60% mild MR mild to mod AI  RV normal Personally reviewed Echo 1/22 EF 55-60% mild MR, mild to mod AI Personally reviewed   Cath 5/19   Ao = 137/66 (94) LV = 141/13 RA = 2 RV = 20/4 PA = 19/6 (12) PCW = 7 Fick cardiac output/index = 5.3/3.1 Thermo CO/CI = 3.6/2.2 PVR = 0.7  WU SVR 1402 Ao sat = 99% PA sat = 79%, 80% High SVC sat = 68% RA sat = 78% RV sat = 75% Qp/Qs = 1.11 Assessment:   1. CAD with patent RCA stent and mild non-obstructive CAD in LAD 2. Normal LVEF 60-65% 3. Normal right heart pressures with no evidence of PAH 4. Small step-up in saturations at RA level suggestive of anomalous pulmonary vein or shunt at atrial level      Past Medical History:  Diagnosis Date   Allergy    Arthritis    CAD (coronary artery disease)    a. DES to RCA 11/2015   Cancer ([facility]    Uterine 2008   CHF (congestive heart failure) ([facility])    Heart murmur    Hyperlipidemia    Osteoporosis    Thyroid disease     Current Outpatient Medications  Medication Sig Dispense Refill   albuterol (VENTOLIN HFA) 108 (90 Base) MCG/ACT inhaler Inhale 2 puffs into the lungs every 4 (four) hours as needed for wheezing or shortness of breath. 18 g 6   aspirin EC 81 MG tablet Take 81 mg by mouth daily.      atorvastatin (LIPITOR) 80 MG tablet TAKE 1 TABLET BY MOUTH  DAILY 90  tablet 3   Calcium Carbonate-Vitamin D (CALCIUM-VITAMIN D3 PO) Take 1 tablet by mouth daily.     cycloSPORINE (RESTASIS) 0.05 % ophthalmic emulsion Place 1 drop into both eyes 2 (two) times daily.     levothyroxine (SYNTHROID) 75 MCG tablet TAKE 1 TABLET BY MOUTH  DAILY BEFORE BREAKFAST 90 tablet 3   Multiple Vitamin (MULTI-VITAMINS) TABS Take 1 tablet by mouth daily.      pantoprazole (PROTONIX) 40 MG tablet Take 1 tablet (40 mg total) by mouth 2 (two) times daily before a meal. Appointment needed for refills 180 tablet 3   vitamin B-12 (CYANOCOBALAMIN) 100 MCG tablet Take 100 mcg by mouth daily.     No current facility-administered medications for this encounter.     Allergies  Allergen Reactions   Avelox [Moxifloxacin Hcl In Nacl] Other (See Comments)    dizziness   Other Other (See Comments)    SEASONAL     Social History   Socioeconomic History   Marital status: Married    Spouse name:  Rhonda Brewer   Number of children: 2   Years of education: 12   Highest education level: 12th grade  Occupational History   Occupation: retired    Comment: Scientist, water quality  Tobacco Use   Smoking status: Never    Passive exposure: Never   Smokeless tobacco: Never  Vaping Use   Vaping Use: Never used  Substance and Sexual Activity   Alcohol use: Yes    Comment: occasional wine   Drug use: No   Sexual activity: Yes    Birth control/protection: Surgical    Comment: hysterectomy  Other Topics Concern   Not on file  Social History Narrative   Lives in home with husband. Daughters live out of state. Step-daughter in [place]. Has 8 grandchildren. Lives in 2 level home but most living is on first floor. Stairs have handrails, no problem with stairs. No grab bars in bathroom. No tripping hazards. Smoke alarms present.   No pets.    Eats a good variety of foods, husband is a cook. Eats meats, fruits, vegetables. Drinks water mostly. Occasional tea.   Goes to gym 5 times a week. Enjoys reading, sewing, movies. Walking when weather is nice.    Wears seat belt in car.    Social Determinants of Health   Financial Resource Strain: Low Risk  (03/09/2018)   Overall Financial Resource Strain (CARDIA)    Difficulty of Paying Living Expenses: Not hard at all  Food Insecurity: No Food Insecurity (03/09/2018)   Hunger Vital Sign    Worried About Running Out of Food in the Last Year: Never true    Ran Out of Food in the Last Year: Never true  Transportation Needs: No Transportation Needs (03/09/2018)   PRAPARE - Hydrologist (Medical): No    Lack of Transportation (Non-Medical): No  Physical Activity: Sufficiently Active (03/09/2018)   Exercise Vital Sign    Days of Exercise per Week: 5 days    Minutes of Exercise per Session: 90 min  Stress: No Stress Concern Present (03/09/2018)   [place]    Feeling  of Stress : Not at all  Social Connections: Moderately Integrated (03/09/2018)   Social Connection and Isolation Panel [NHANES]    Frequency of Communication with Friends and Family: More than three times a week    Frequency of Social Gatherings with Friends and Family: More than three times a week  Attends Religious Services: Never    Active Member of Clubs or Organizations: Yes    Attends Archivist Meetings: More than 4 times per year    Marital Status: Married  Human resources officer Violence: Not At Risk (03/09/2018)   Humiliation, Afraid, Rape, and Kick questionnaire    Fear of Current or Ex-Partner: No    Emotionally Abused: No    Physically Abused: No    Sexually Abused: No    Family History  Problem Relation Age of Onset   Asthma Maternal Aunt    Heart disease Mother    Diabetes Mother    Heart disease Father    Stomach cancer Brother    Diabetes Brother    Colon cancer Neg Hx    Esophageal cancer Neg Hx    Rectal cancer Neg Hx     PHYSICAL EXAM: Vitals:   10/03/21 1042  BP: 108/64  Pulse: 73  SpO2: 96%   General:  Well appearing. No resp difficulty HEENT: normal +telangectacias   Neck: supple. no JVD. Carotids 2+ bilat; no bruits. No lymphadenopathy or thryomegaly appreciated. Cor: PMI nondisplaced. Regular rate & rhythm. No rubs, gallops or murmurs. Lungs: clear Abdomen: soft, nontender, nondistended. No hepatosplenomegaly. No bruits or masses. Good bowel sounds. Extremities: no cyanosis, clubbing, rash, edema + joint thickening Neuro: alert & orientedx3, cranial nerves grossly intact. moves all 4 extremities w/o difficulty. Affect pleasant   ASSESSMENT & PLAN:  1. CAD s/p RCA stent in 11/2015: - repeat cath 5/19 for recurrent CP show mild non-obstructive CAD - results reviewed with her - very active. No s/s ischemia - Continue ASA and atorvastatin.  - Recheck lipids. Goal LDL < 70 - Will refer to Risk Reduction Clinic to discuss ongoing  strategies to reduce risk of future cardiac events - We also discussed Dr. Caren Macadam books  2. Scleroderma - DLCO mildly reduced on PFTs 6/18. No R heart strain on echo. CT without ILD.  - RHC 5/19 no PAH - PFTs 12/20 with normalization of DLCO.  - No evidence of scleroderma-related lung disease (PAH or fibrosis).  - Echo and recent PFTs stable. No sign PAH - Repeat echo and PFTs every 2 years  3. Hypertension - Blood pressure well controlled. Continue current regimen.  4. Valvular disease - mild MR and mild-mod AI - stable on echo  Glori Bickers, MD  11:22 AM

## 2021-10-03 NOTE — Patient Instructions (Signed)
There has been no changes to your medications.  Labs done today, your results will be available in MyChart, we will contact you for abnormal readings.  Your physician has requested that you have an echocardiogram. Echocardiography is a painless test that uses sound waves to create images of your heart. It provides your doctor with information about the size and shape of your heart and how well your heart's chambers and valves are working. This procedure takes approximately one hour. There are no restrictions for this procedure.  You have been referred to Risk Reduction Pharmacist. They will call you to arrange your appointment.  Your physician recommends that you schedule a follow-up appointment in: 18 months with an echocardiogram ( December 2024)  ** please call the office in September 2024 to arrange your follow up appointment.  If you have any questions or concerns before your next appointment please send [place] a message through [place] or call our office at [phone].    TO LEAVE A MESSAGE FOR THE NURSE SELECT OPTION 2, PLEASE LEAVE A MESSAGE INCLUDING: YOUR NAME DATE OF BIRTH CALL BACK NUMBER REASON FOR CALL**this is important as we prioritize the call backs  YOU WILL RECEIVE A CALL BACK THE SAME DAY AS LONG AS YOU CALL BEFORE 4:00 PM  At the [facility], you and your health needs are our priority. As part of our continuing mission to provide you with exceptional heart care, we have created designated Provider Care Teams. These Care Teams include your primary Cardiologist (physician) and Advanced Practice Providers (APPs- Physician Assistants and Nurse Practitioners) who all work together to provide you with the care you need, when you need it.   You may see any of the following providers on your designated Care Team at your next follow up: Dr Glori Bickers Dr Haynes Kerns, NP Lyda Jester, [place] [facility] [place], [place] Audry Riles,  PharmD   Please be sure to bring in all your medications bottles to every appointment.

## 2021-10-19 ENCOUNTER — Other Ambulatory Visit: Payer: Self-pay | Admitting: Physician Assistant

## 2021-11-09 ENCOUNTER — Ambulatory Visit: Payer: Medicare Other | Admitting: Pharmacist

## 2021-11-09 ENCOUNTER — Encounter: Payer: Self-pay | Admitting: Pharmacist

## 2021-11-09 VITALS — BP 145/74 | HR 67 | Wt 142.8 lb

## 2021-11-09 DIAGNOSIS — E785 Hyperlipidemia, unspecified: Secondary | ICD-10-CM

## 2021-11-09 DIAGNOSIS — I2510 Atherosclerotic heart disease of native coronary artery without angina pectoris: Secondary | ICD-10-CM | POA: Diagnosis not present

## 2021-11-09 NOTE — Progress Notes (Signed)
Patient ID: Rhonda Brewer                 DOB: 11-20-1950                    MRN: [id number]     HPI: Rhonda Brewer is a 71 y.o. female patient referred to lipid clinic by Dr Haroldine Laws.Marland Kitchen PMH is significant for CAD, HLD, and HTN.  Had cath in 2017 and repeated in 2019.  Patient presents today in good spirits. Previously lived in [place], moved to [place] 6 years ago. Very physically active.  Walks/jogs 2-3 miles per day although she reports her knees have started to bother her so she has been slowing.  Husband does the majority of the cooking. Does not eat any processed foods. Traveling to [place] in October.  Currently managed on atorvastatin '80mg'$ . Would like to reduce dose of atorvastatin due to tablet size. Has a strong family history of heart disease on both sides of family.  Current Medications:  Atorvastatin '80mg'$   Intolerances: [name]  Risk Factors:  CAD Family History  LDL goal: <70  Labs: TC 176, Trigs 71, HDL 76, LDL 86 (10/03/21 on atorva 80)  Past Medical History:  Diagnosis Date   Allergy    Arthritis    CAD (coronary artery disease)    a. DES to RCA 11/2015   Cancer [facility])    Uterine 2008   CHF (congestive heart failure) ([facility])    Heart murmur    Hyperlipidemia    Osteoporosis    Thyroid disease     Current Outpatient Medications on File Prior to Visit  Medication Sig Dispense Refill   albuterol (VENTOLIN HFA) 108 (90 Base) MCG/ACT inhaler Inhale 2 puffs into the lungs every 4 (four) hours as needed for wheezing or shortness of breath. 18 g 6   aspirin EC 81 MG tablet Take 81 mg by mouth daily.      atorvastatin (LIPITOR) 80 MG tablet TAKE 1 TABLET BY MOUTH  DAILY 90 tablet 3   Calcium Carbonate-Vitamin D (CALCIUM-VITAMIN D3 PO) Take 1 tablet by mouth daily.     cycloSPORINE (RESTASIS) 0.05 % ophthalmic emulsion Place 1 drop into both eyes 2 (two) times daily.     levothyroxine (SYNTHROID) 75 MCG tablet TAKE 1 TABLET BY MOUTH  DAILY BEFORE BREAKFAST 90 tablet  3   Multiple Vitamin (MULTI-VITAMINS) TABS Take 1 tablet by mouth daily.      pantoprazole (PROTONIX) 40 MG tablet Take 1 tablet (40 mg total) by mouth 2 (two) times daily before a meal. Appointment needed for refills 180 tablet 3   vitamin B-12 (CYANOCOBALAMIN) 100 MCG tablet Take 100 mcg by mouth daily.     No current facility-administered medications on file prior to visit.    Allergies  Allergen Reactions   Avelox [Moxifloxacin Hcl In Nacl] Other (See Comments)    dizziness   Other Other (See Comments)    SEASONAL     Assessment/Plan:  1. Hyperlipidemia - Patient LDL 86 which is above goal of <70 despite high intensity statin.  Recommend PCSK9i due to CAD.  Using [place] [employer], educated patient on mechanism of action, storage, site selection, administration and possible adverse effects. Patient was able to demonstrate in room. Will complete PA and contact patient when approved. Recheck lipid panel in 2-3 months. Advised it may be possible to reduce her atorvastatin dose based on results of repeat lipid panel.  Continue atorvastatin '80mg'$  daily Start Repatha '140mg'$  sq  q 14 days Recheck lipid panel in 2-3 months  Karren Cobble, PharmD, [place], [place], [place], [place], [place], [address] Phone: [phone], Fax: [phone]

## 2021-11-09 NOTE — Patient Instructions (Addendum)
It was nice to meet you today  We would like your LDL (bad cholesterol) to be less than 70  Please continue your atorvastatin '80mg'$  daily  We would like to start you on a new medication called Repatha which you will inject once every 2 weeks  I will complete the prior authorization for you and contact you when it is approved  Once you start the medication we will recheck your cholesterol in 2-3 months  Please call with any questions  Karren Cobble, PharmD, [place], [place], [place], [place], [place], [address] Phone: [phone], Fax: [phone]

## 2021-11-14 ENCOUNTER — Emergency Department (HOSPITAL_COMMUNITY): Payer: Medicare Other

## 2021-11-14 ENCOUNTER — Encounter (HOSPITAL_COMMUNITY): Payer: Self-pay

## 2021-11-14 ENCOUNTER — Emergency Department (HOSPITAL_COMMUNITY)
Admission: EM | Admit: 2021-11-14 | Discharge: 2021-11-14 | Disposition: A | Discharge status: Home / Self Care | Payer: Medicare Other | Attending: Emergency Medicine | Admitting: Emergency Medicine

## 2021-11-14 DIAGNOSIS — R791 Abnormal coagulation profile: Secondary | ICD-10-CM | POA: Insufficient documentation

## 2021-11-14 DIAGNOSIS — Z7982 Long term (current) use of aspirin: Secondary | ICD-10-CM | POA: Insufficient documentation

## 2021-11-14 DIAGNOSIS — I2510 Atherosclerotic heart disease of native coronary artery without angina pectoris: Secondary | ICD-10-CM | POA: Insufficient documentation

## 2021-11-14 DIAGNOSIS — Z8542 Personal history of malignant neoplasm of other parts of uterus: Secondary | ICD-10-CM | POA: Diagnosis not present

## 2021-11-14 DIAGNOSIS — R0789 Other chest pain: Secondary | ICD-10-CM | POA: Insufficient documentation

## 2021-11-14 DIAGNOSIS — R42 Dizziness and giddiness: Secondary | ICD-10-CM | POA: Diagnosis not present

## 2021-11-14 DIAGNOSIS — I10 Essential (primary) hypertension: Secondary | ICD-10-CM | POA: Diagnosis not present

## 2021-11-14 DIAGNOSIS — R0602 Shortness of breath: Secondary | ICD-10-CM | POA: Insufficient documentation

## 2021-11-14 DIAGNOSIS — J811 Chronic pulmonary edema: Secondary | ICD-10-CM | POA: Diagnosis not present

## 2021-11-14 DIAGNOSIS — R079 Chest pain, unspecified: Secondary | ICD-10-CM | POA: Diagnosis not present

## 2021-11-14 LAB — CBC
HCT: 44.3 % (ref 36.0–46.0)
Hemoglobin: 14.7 g/dL (ref 12.0–15.0)
MCH: 32 pg (ref 26.0–34.0)
MCHC: 33.2 g/dL (ref 30.0–36.0)
MCV: 96.5 fL (ref 80.0–100.0)
Platelets: 234 10*3/uL (ref 150–400)
RBC: 4.59 MIL/uL (ref 3.87–5.11)
RDW: 12.1 % (ref 11.5–15.5)
WBC: 5.2 10*3/uL (ref 4.0–10.5)
nRBC: 0 % (ref 0.0–0.2)

## 2021-11-14 LAB — BASIC METABOLIC PANEL
Anion gap: 6 (ref 5–15)
BUN: 14 mg/dL (ref 8–23)
CO2: 25 mmol/L (ref 22–32)
Calcium: 9.7 mg/dL (ref 8.9–10.3)
Chloride: 109 mmol/L (ref 98–111)
Creatinine, Ser: 0.88 mg/dL (ref 0.44–1.00)
GFR, Estimated: >60 mL/min (ref >60)
Glucose, Bld: 69 mg/dL — ABNORMAL LOW (ref 70–99)
Potassium: 3.5 mmol/L (ref 3.5–5.1)
Sodium: 140 mmol/L (ref 135–145)

## 2021-11-14 LAB — CBG MONITORING, ED: Glucose-Capillary: 98 mg/dL (ref 70–99)

## 2021-11-14 LAB — TROPONIN I (HIGH SENSITIVITY)
Troponin I (High Sensitivity): 8 ng/L (ref <18)
Troponin I (High Sensitivity): 9 ng/L (ref <18)

## 2021-11-14 LAB — D-DIMER, QUANTITATIVE: D-Dimer, Quant: 0.88 ug/mL-FEU — ABNORMAL HIGH (ref 0.00–0.50)

## 2021-11-14 LAB — BRAIN NATRIURETIC PEPTIDE: B Natriuretic Peptide: 62 pg/mL (ref 0.0–100.0)

## 2021-11-14 MED ORDER — ASPIRIN 81 MG PO CHEW
324.0000 mg | CHEWABLE_TABLET | ORAL | Status: AC
  Administered 2021-11-14: 324 mg via ORAL
  Filled 2021-11-14: qty 4

## 2021-11-14 MED ORDER — SODIUM CHLORIDE (PF) 0.9 % IJ SOLN
INTRAMUSCULAR | Status: AC
  Filled 2021-11-14: qty 50

## 2021-11-14 MED ORDER — IOHEXOL 350 MG/ML SOLN
100.0000 mL | Freq: Once | INTRAVENOUS | Status: AC | PRN
  Administered 2021-11-14: 65 mL via INTRAVENOUS

## 2021-11-14 NOTE — ED Provider Notes (Signed)
[facility] DEPT Provider Note   CSN: [id number] Arrival date & time: 11/14/21  6144     History  Chief Complaint  Patient presents with  . Shortness of Breath    Rhonda Brewer is a 71 y.o. female.  71 year old female with a history of CAD status post PCI, HTN, HLD, and uterine cancer in remission who presents with shortness of breath, chest discomfort, and dizziness. States that last night she woke up and went to the restroom and started feeling exertional shortness of breath and dizziness.  Says that it persisted today and she has also had substernal chest pressure that does not radiate.  Says that the dizziness and shortness of breath are exertional but no exertional component to the chest discomfort.  No nausea or vomiting or diaphoresis.  Denies any history of DVT or PE or leg swelling.  Does have a history of uterine cancer that is currently in remission.  Had a cath in 2017 where she had a stent placed with a repeat cath in 2019 that showed nonobstructive CAD.  Did not take any aspirin prior to arrival or nitroglycerin.   Shortness of Breath      Home Medications Prior to Admission medications   Medication Sig Start Date End Date Taking? Authorizing Provider  albuterol (VENTOLIN HFA) 108 (90 Base) MCG/ACT inhaler Inhale 2 puffs into the lungs every 4 (four) hours as needed for wheezing or shortness of breath. 08/09/21   [name], MD  aspirin EC 81 MG tablet Take 81 mg by mouth daily.  12/05/14   [provider]  atorvastatin (LIPITOR) 80 MG tablet TAKE 1 TABLET BY MOUTH  DAILY 09/10/21   [name], MD  Calcium Carbonate-Vitamin D (CALCIUM-VITAMIN D3 PO) Take 1 tablet by mouth daily.    [provider]  cycloSPORINE (RESTASIS) 0.05 % ophthalmic emulsion Place 1 drop into both eyes 2 (two) times daily.    [provider]  levothyroxine (SYNTHROID) 75 MCG tablet TAKE 1 TABLET BY MOUTH  DAILY BEFORE BREAKFAST  09/10/21   [name], MD  Multiple Vitamin (MULTI-VITAMINS) TABS Take 1 tablet by mouth daily.     [provider]  pantoprazole (PROTONIX) 40 MG tablet Take 1 tablet (40 mg total) by mouth 2 (two) times daily before a meal. Appointment needed for refills 11/16/20   Levin Erp, PA  vitamin B-12 (CYANOCOBALAMIN) 100 MCG tablet Take 100 mcg by mouth daily.    [provider]      Allergies    Avelox [moxifloxacin hcl in nacl] and Other    Review of Systems   Review of Systems  Respiratory:  Positive for shortness of breath.     Physical Exam Updated Vital Signs BP (!) 150/93   Pulse (!) 119   Temp 98.2 F (36.8 C) (Oral)   Resp (!) 9   SpO2 98%  Physical Exam Vitals and nursing note reviewed.  Constitutional:      General: She is not in acute distress.    Appearance: She is well-developed.  HENT:     Head: Normocephalic and atraumatic.     Right Ear: External ear normal.     Left Ear: External ear normal.     Nose: Nose normal.  Eyes:     Extraocular Movements: Extraocular movements intact.     Conjunctiva/sclera: Conjunctivae normal.     Pupils: Pupils are equal, round, and reactive to light.  Cardiovascular:     Rate  and Rhythm: Normal rate and regular rhythm.     Heart sounds: No murmur heard.    Comments: No rashes.  Chest pain nonreproducible. Pulmonary:     Effort: Pulmonary effort is normal. No respiratory distress.     Breath sounds: Normal breath sounds.  Abdominal:     General: Abdomen is flat. There is no distension.     Palpations: Abdomen is soft.  Musculoskeletal:        General: No swelling.     Cervical back: Normal range of motion and neck supple.     Right lower leg: No edema.     Left lower leg: No edema.  Skin:    General: Skin is warm and dry.     Capillary Refill: Capillary refill takes less than 2 seconds.  Neurological:     Mental Status: She is alert and oriented to person, place, and time. Mental status  is at baseline.  Psychiatric:        Mood and Affect: Mood normal.     ED Results / Procedures / Treatments   Labs (all labs ordered are listed, but only abnormal results are displayed) Labs Reviewed  BASIC METABOLIC PANEL - Abnormal; Notable for the following components:      Result Value   Glucose, Bld 69 (*)    All other components within normal limits  D-DIMER, QUANTITATIVE - Abnormal; Notable for the following components:   D-Dimer, Quant 0.88 (*)    All other components within normal limits  CBC  BRAIN NATRIURETIC PEPTIDE  CBG MONITORING, ED  TROPONIN I (HIGH SENSITIVITY)  TROPONIN I (HIGH SENSITIVITY)    EKG EKG Interpretation  Date/Time:  Wednesday November 14 2021 13:50:49 EDT Ventricular Rate:  57 PR Interval:  153 QRS Duration: 95 QT Interval:  439 QTC Calculation: 428 R Axis:   58 Text Interpretation: Sinus rhythm Confirmed by Margaretmary Eddy [phone]) on 11/14/2021 8:41:04 PM  Radiology CT Angio Chest PE W and/or Wo Contrast  Result Date: 11/14/2021 CLINICAL DATA:  Pulmonary embolism (PE) suspected, positive D-dimer EXAM: CT ANGIOGRAPHY CHEST WITH CONTRAST TECHNIQUE: Multidetector CT imaging of the chest was performed using the standard protocol during bolus administration of intravenous contrast. Multiplanar CT image reconstructions and MIPs were obtained to evaluate the vascular anatomy. RADIATION DOSE REDUCTION: This exam was performed according to the departmental dose-optimization program which includes automated exposure control, adjustment of the mA and/or kV according to patient size and/or use of iterative reconstruction technique. CONTRAST:  71m OMNIPAQUE IOHEXOL 350 MG/ML SOLN COMPARISON:  Chest CT 08/30/2021 FINDINGS: Cardiovascular: Satisfactory opacification of the pulmonary arteries to the segmental level. No evidence of pulmonary embolism. Mild cardiomegaly.No pericardial disease. Moderate atherosclerosis of the thoracic aorta. Mild reflux of contrast  into the IVC and hepatic veins. Mediastinum/Nodes: No lymphadenopathy. Atrophic thyroid. Esophagus is unremarkable. Lungs/Pleura: There is mild interlobular septal thickening and ground-glass in the lower lobes. No focal airspace consolidation. No suspicious pulmonary nodules. No pleural effusion. No pneumothorax. Upper Abdomen: No acute abnormality. Musculoskeletal: No chest wall abnormality. No acute or significant osseous findings. Review of the MIP images confirms the above findings. IMPRESSION: No evidence of pulmonary embolism. Cardiomegaly and mild pulmonary edema. Electronically Signed   By: JMaurine SimmeringM.D.   On: 11/14/2021 12:03   DG Chest Port 1 View  Result Date: 11/14/2021 CLINICAL DATA:  Chest pain EXAM: PORTABLE CHEST 1 VIEW COMPARISON:  CT 08/20/2021 FINDINGS: Heart size normal. No pleural effusion or edema. No airspace opacities identified. The visualized  osseous structures are unremarkable. IMPRESSION: No acute cardiopulmonary abnormalities. Electronically Signed   By: Kerby Moors M.D.   On: 11/14/2021 09:48    Procedures Procedures   Medications Ordered in ED Medications  aspirin chewable tablet 324 mg (324 mg Oral Given 11/14/21 1012)  iohexol (OMNIPAQUE) 350 MG/ML injection 100 mL (65 mLs Intravenous Contrast Given 11/14/21 1136)  sodium chloride (PF) 0.9 % injection (  Given by Other 11/14/21 1145)    ED Course/ Medical Decision Making/ A&P Clinical Course as of 11/14/21 2045  Wed Nov 14, 2021  1157 CTA reviewed and interpreted by me as showing no large or proximal pulmonary embolism. [RP]  8850 CT Angio Chest PE W and/or Wo Contrast IMPRESSION: No evidence of pulmonary embolism.  Cardiomegaly and mild pulmonary edema. [RP]  1354 Repeat ECG shows sinus bradycardia 57 bpm.  Normal axis and intervals.  No signs of ischemia or arrhythmia. Heart Score of 5.  [RP]    Clinical Course User Index [RP] Fransico Meadow, MD                           Medical Decision  Making Amount and/or Complexity of Data Reviewed Labs: ordered. Radiology: ordered. Decision-making details documented in ED Course.  Risk OTC drugs. Prescription drug management.   71 year old female with a history of CAD status post PCI, HTN, HLD, and uterine cancer in remission who presents with shortness of breath, chest discomfort, and dizziness.  Initial Ddx:  MI, PE, anemia, pneumonia  MDM:  Feel the patient's symptoms are likely due to MI or pulmonary embolism.  No bleeding recently that would suggest anemia but will check blood counts as well.  Considered pneumonia but no other infectious signs at this time.  Plan:  Labs Troponin D-dimer BNP Chest x-ray EKG  ED Summary:  Patient underwent the above work-up which showed an elevated D-dimer and she had a subsequent CTA that did not show any evidence of pulmonary embolism.  Did show mild cardiomegaly and pulmonary edema but the patient's BNP was noted to be normal at 62 so feel that this is unlikely due to a heart failure exacerbation.  Her blood sugar was normal on her BMP and she was given food with repeat blood sugar that was WNL.  High-sensitivity troponins were 8 and 9.  Given the patient's risk factors and presentation they discussed admission versus outpatient work-up with the patient and her family member they opted for outpatient work-up at this time.  She was feeling much better at the time of discharge.  Ambulatory referral for cardiology was placed and she was also instructed to follow-up with her primary doctor.  Dispo: DC Home. Return precautions discussed including, but not limited to, those listed in the AVS. Allowed pt time to ask questions which were answered fully prior to dc.   Additional history obtained from spouse Records reviewed OP Notes The following labs were independently interpreted: Chemistry, Serial Troponins, and D-dimer I independently visualized the following imaging with scope of  interpretation limited to determining acute life threatening conditions related to emergency care: Chest x-ray, which revealed no acute abnormality   Final Clinical Impression(s) / ED Diagnoses Final diagnoses:  Chest discomfort  Shortness of breath  Dizziness and giddiness    Rx / DC Orders ED Discharge Orders          Ordered    Ambulatory referral to Cardiology       Comments: Regarding possible  stress test   11/14/21 1400              Fransico Meadow, MD 11/14/21 2045

## 2021-11-14 NOTE — ED Triage Notes (Signed)
Pt arrived via POV, c/o chest tightness, SOB, and dizziness that started when she got up to go to the bathroom early this morning. States worsening sx with deep breathing or mvmt. Sx continuous since this morning.

## 2021-11-14 NOTE — Discharge Instructions (Signed)
Today you were seen in the emergency department for your chest discomfort, shortness of breath, dizziness.    In the emergency department you had lab work drawn that was reassuring and EKG.    Follow-up with your primary doctor in 2-3 days regarding your visit.  A consult for your cardiologist to contact you about a stress test has also been placed.  Return immediately to the emergency department if you experience any of the following: Chest discomfort, shortness of breath, or any other concerning symptoms.    Thank you for visiting our Emergency Department. It was a pleasure taking care of you today.

## 2021-11-14 NOTE — ED Notes (Signed)
Provided pt with applesauce and saltines per provider d/t low blood glucose on lab results

## 2021-11-15 ENCOUNTER — Telehealth: Payer: Self-pay

## 2021-11-15 NOTE — Patient Outreach (Signed)
  Care Coordination TOC Note Transition Care Management Unsuccessful Follow-up Telephone Call  Date of discharge and from where:  [facility] 11/14/21  Attempts:  1st Attempt  Reason for unsuccessful TCM follow-up call:  No answer/busy  Johnney Killian, RN, BSN, CCM Care Management Coordinator [facility] Phone: [phone]: [phone]

## 2021-11-15 NOTE — Telephone Encounter (Signed)
Transitions of care nurse reached out to RN team in regards to patient and scheduling an apt.    Patient was seen in in ED yesterday for SOB, chest discomfort and dizziness. Patient was discharged home after being seen.  I called patient who reports continued SOB with exertion, however does report feeling better than yesterday. Patient reports she only feels dizzy when she stands. She has been staying hydrated. BP while on the phone was 129/74 and HR 70.  I scheduled patient an apt with [facility] tomorrow. However, patient was able to be worked in at her cardiologist office tomorrow afternoon.   Will forward to PCP to see if [facility] visit is needed.   ED precautions given to patient.

## 2021-11-15 NOTE — Telephone Encounter (Signed)
Patient aware tomorrows ATC apt is not needed.   Patient to FU with Cardiology as scheduled.

## 2021-11-15 NOTE — Patient Outreach (Signed)
  Care Coordination TOC Note Transition Care Management Follow-up Telephone Call Date of discharge and from where: [facility] 10/3021 How have you been since you were released from the hospital? "I still don't feel right.  When I exert myself I get a bit dizzy and winded". Any questions or concerns? Yes- Patient continues not to feel well.  High priority message sent to [facility] nurse pool to see if patient can be seen at office.  Items Reviewed: Did the pt receive and understand the discharge instructions provided? Yes  Medications obtained and verified? Yes  Other? No  Any new allergies since your discharge? No  Dietary orders reviewed? Yes Do you have support at home? Yes   Home Care and Equipment/Supplies: Were home health services ordered? no If so, what is the name of the agency? [name]  Has the agency set up a time to come to the patient's home? no Were any new equipment or medical supplies ordered?  No What is the name of the medical supply agency? [name] Were you able to get the supplies/equipment? not applicable Do you have any questions related to the use of the equipment or supplies? No  Functional Questionnaire: (I = Independent and D = Dependent) ADLs: I  Bathing/Dressing- I  Meal Prep- I  Eating- I  Maintaining continence- I  Transferring/Ambulation- I  Managing Meds- I  Follow up appointments reviewed:  PCP Hospital f/u appt confirmed? No   Specialist Hospital f/u appt confirmed? Yes  Scheduled to see Dr. Haroldine Laws on 11/16/21 @ 2:20. Are transportation arrangements needed? No  If their condition worsens, is the pt aware to call PCP or go to the Emergency Dept.? Yes Was the patient provided with contact information for the PCP's office or ED? Yes Was to pt encouraged to call back with questions or concerns? Yes  SDOH assessments and interventions completed:   Yes  Care Coordination Interventions Activated:  Yes   Care Coordination Interventions:  PCP follow up  appointment requested    Encounter Outcome:  Pt. Visit Completed

## 2021-11-16 ENCOUNTER — Encounter (HOSPITAL_COMMUNITY): Payer: Self-pay | Admitting: Internal Medicine

## 2021-11-16 ENCOUNTER — Ambulatory Visit (HOSPITAL_COMMUNITY)
Admission: RE | Admit: 2021-11-16 | Discharge: 2021-11-16 | Disposition: A | Discharge status: Home / Self Care | Payer: Medicare Other | Source: Ambulatory Visit | Attending: Internal Medicine | Admitting: Internal Medicine

## 2021-11-16 ENCOUNTER — Other Ambulatory Visit (HOSPITAL_COMMUNITY): Payer: Self-pay | Admitting: *Deleted

## 2021-11-16 ENCOUNTER — Ambulatory Visit: Payer: Medicare Other

## 2021-11-16 VITALS — BP 114/72 | HR 58 | Wt 140.2 lb

## 2021-11-16 DIAGNOSIS — Z79899 Other long term (current) drug therapy: Secondary | ICD-10-CM | POA: Diagnosis not present

## 2021-11-16 DIAGNOSIS — Z7982 Long term (current) use of aspirin: Secondary | ICD-10-CM | POA: Diagnosis not present

## 2021-11-16 DIAGNOSIS — R0789 Other chest pain: Secondary | ICD-10-CM | POA: Insufficient documentation

## 2021-11-16 DIAGNOSIS — I110 Hypertensive heart disease with heart failure: Secondary | ICD-10-CM | POA: Insufficient documentation

## 2021-11-16 DIAGNOSIS — Z7902 Long term (current) use of antithrombotics/antiplatelets: Secondary | ICD-10-CM | POA: Insufficient documentation

## 2021-11-16 DIAGNOSIS — I509 Heart failure, unspecified: Secondary | ICD-10-CM | POA: Diagnosis not present

## 2021-11-16 DIAGNOSIS — R0602 Shortness of breath: Secondary | ICD-10-CM | POA: Diagnosis not present

## 2021-11-16 DIAGNOSIS — I25119 Atherosclerotic heart disease of native coronary artery with unspecified angina pectoris: Secondary | ICD-10-CM | POA: Insufficient documentation

## 2021-11-16 DIAGNOSIS — M349 Systemic sclerosis, unspecified: Secondary | ICD-10-CM | POA: Insufficient documentation

## 2021-11-16 LAB — CBC
HCT: 42.1 % (ref 36.0–46.0)
Hemoglobin: 14.6 g/dL (ref 12.0–15.0)
MCH: 32.2 pg (ref 26.0–34.0)
MCHC: 34.7 g/dL (ref 30.0–36.0)
MCV: 92.9 fL (ref 80.0–100.0)
Platelets: 235 10*3/uL (ref 150–400)
RBC: 4.53 MIL/uL (ref 3.87–5.11)
RDW: 11.9 % (ref 11.5–15.5)
WBC: 6.2 10*3/uL (ref 4.0–10.5)
nRBC: 0 % (ref 0.0–0.2)

## 2021-11-16 LAB — BASIC METABOLIC PANEL
Anion gap: 12 (ref 5–15)
BUN: 15 mg/dL (ref 8–23)
CO2: 22 mmol/L (ref 22–32)
Calcium: 9.9 mg/dL (ref 8.9–10.3)
Chloride: 106 mmol/L (ref 98–111)
Creatinine, Ser: 0.85 mg/dL (ref 0.44–1.00)
GFR, Estimated: >60 mL/min (ref >60)
Glucose, Bld: 124 mg/dL — ABNORMAL HIGH (ref 70–99)
Potassium: 3.9 mmol/L (ref 3.5–5.1)
Sodium: 140 mmol/L (ref 135–145)

## 2021-11-16 MED ORDER — SODIUM CHLORIDE 0.9% FLUSH: 3.0000 mL | Freq: Two times a day (BID) | INTRAVENOUS | Status: DC

## 2021-11-16 NOTE — Patient Instructions (Addendum)
It was great to see you today! No medication changes are needed at this time.  Labs today We will only contact you if something comes back abnormal or we need to make some changes. Otherwise no news is good news!   Your physician wants you to follow-up in: 6 months with Dr Haroldine Laws. You will receive a reminder letter in the mail two months in advance. If you don't receive a letter, please call our office to schedule the follow-up appointment.     You are scheduled for a Cardiac Catheterization on Tuesday, September 5 with Dr. Glori Bickers.  1. Please arrive at the [facility] (Main Entrance A) at [facility]: [address] [place], [place] [address] at 5:30 AM (This time is two hours before your procedure to ensure your preparation). Free valet parking service is available.   Special note: Every effort is made to have your procedure done on time. Please understand that emergencies sometimes delay scheduled procedures.  2. Diet: Do not eat solid foods after midnight.  The patient may have clear liquids until 5am upon the day of the procedure.  3. Labs: pre procedure labs done 11/16/2021  4. Medication instructions in preparation for your procedure:   Contrast Allergy: No     On the morning of your procedure, take your aspirin and any morning medicines NOT listed above.  You may use sips of water.  5. Plan for one night stay--bring personal belongings. 6. Bring a current list of your medications and current insurance cards. 7. You MUST have a responsible person to drive you home. 8. Someone MUST be with you the first 24 hours after you arrive home or your discharge will be delayed. 9. Please wear clothes that are easy to get on and off and wear slip-on shoes.  Thank you for allowing [place] to care for you!   -- [facility]

## 2021-11-16 NOTE — H&P (View-Only) (Signed)
PCP: Dr. Andria Frames Referring: Dr. Kirke Corin    HPI:  Rhonda Brewer is a 71 y/o woman with h/o scleroderma, CAD s/p stent 9/17 , SVT s/p ablation 2/05 referred by Dr. Patrecia Pour for screening for [facility] in setting of scleroderma.   Previously lived in [place]. 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 At time had exertional fatigue and dyspnea and arm tingling. No CP.    Cath 11/24/15 LM: mild irregs LAD: 20% mid LCX: mild irreg RCA: mRCA 99% ->Xience Alpine DES 3.5x85m  Echo 11/22/15: LVEF 55-60% Mild AI. Normal RV. Mild TR.  Echo 08/27/16: EF 60-65%,  RV normal Mild AI. No RV strain or PAH.   PFTs 08/2016 FEV1 2.11 (88%) FVC 2.86 (91%) DLCO 63%  PFTs 12/20 FEV1 2.32 (99%) FVC 2.90 (94%) DLCO 85%  PFTs 12/22 FEV1 2.05L (90%) FVC 2.63L (87%) DLCO 76%  PFTs 6/23  FEV1 2.17 (95%) FVC 2.75 (91%) DLCO 82%  She was seen in the HF clinic for initial evaluation in June 2018. It was felt that her dyspnea could be due to Brilinta. She was switched to Plavix. Chest CT was ordered and showed no interstitial lung disease.   Cath 5/19 which showed very mild CAD. No evidence. There was a small step-up in saturations at RA level suggestive of anomalous pulmonary vein or shunt at atrial level   Mid RCA lesion is 20% stenosed. Prox LAD lesion is 40% stenosed.   Repeat hi-res CT on 09/07/21. No ILD.  3v coronary calcium   Here for acute visit. Wednesday she woke up and had chest discomfort which persisted all morning. Finally went to ER. ECG ok. Hstrop 9. Had chest CT. No PE. Mild septal thickening possible edema. No recurrent CP but now SOB with even mild activity. Has not gone back to her exercise program. Feels like she can't get a full breath. Very tired. No orthopnea or PND. [place]     Studies:  Echo  2/23 : 55-60% mild MR mild to mod AI  RV normal Personally reviewed Echo 1/22 EF 55-60% mild MR, mild to mod AI Personally reviewed   Cath 5/19   Ao = 137/66 (94) LV = 141/13 RA = 2 RV = 20/4 PA = 19/6 (12) PCW = 7 Fick cardiac output/index = 5.3/3.1 Thermo CO/CI = 3.6/2.2 PVR = 0.7 WU SVR 1402 Ao sat = 99% PA sat = 79%, 80% High SVC sat = 68% RA sat = 78% RV sat = 75% Qp/Qs = 1.11 Assessment:   1. CAD with patent RCA stent and mild non-obstructive CAD in LAD 2. Normal LVEF 60-65% 3. Normal right heart pressures with no evidence of PAH 4. Small step-up in saturations at RA level suggestive of anomalous pulmonary vein or shunt at atrial level      Past Medical History:  Diagnosis Date   Allergy    Arthritis    CAD (coronary artery disease)    a. DES to RCA 11/2015   Cancer ([facility]    Uterine 2008   CHF (congestive heart failure) (HCC)    Heart murmur    Hyperlipidemia    Osteoporosis    Thyroid disease     Current Outpatient Medications  Medication Sig Dispense Refill   albuterol (VENTOLIN HFA) 108 (90 Base)  MCG/ACT inhaler Inhale 2 puffs into the lungs every 4 (four) hours as needed for wheezing or shortness of breath. 18 g 6   aspirin EC 81 MG tablet Take 81 mg by mouth daily.      atorvastatin (LIPITOR) 80 MG tablet TAKE 1 TABLET BY MOUTH  DAILY 90 tablet 3   Calcium Carbonate-Vitamin D (CALCIUM-VITAMIN D3 PO) Take 1 tablet by mouth daily.     cycloSPORINE (RESTASIS) 0.05 % ophthalmic emulsion Place 1 drop into both eyes 2 (two) times daily.     levothyroxine (SYNTHROID) 75 MCG tablet TAKE 1 TABLET BY MOUTH  DAILY BEFORE BREAKFAST 90 tablet 3   Multiple Vitamin (MULTI-VITAMINS) TABS Take 1 tablet by mouth daily.      pantoprazole (PROTONIX) 40 MG tablet Take 1 tablet (40 mg total) by mouth 2 (two) times daily before a meal. Appointment needed for refills 180 tablet 3   vitamin B-12 (CYANOCOBALAMIN) 100 MCG tablet Take 100 mcg by mouth daily.     No current  facility-administered medications for this encounter.     Allergies  Allergen Reactions   Avelox [Moxifloxacin Hcl In Nacl] Other (See Comments)    dizziness   Other Other (See Comments)    SEASONAL     Social History   Socioeconomic History   Marital status: Married    Spouse name: Cay Schillings   Number of children: 2   Years of education: 12   Highest education level: 12th grade  Occupational History   Occupation: retired    Comment: Scientist, water quality  Tobacco Use   Smoking status: Never    Passive exposure: Never   Smokeless tobacco: Never  Vaping Use   Vaping Use: Never used  Substance and Sexual Activity   Alcohol use: Yes    Comment: occasional wine   Drug use: No   Sexual activity: Yes    Birth control/protection: Surgical    Comment: hysterectomy  Other Topics Concern   Not on file  Social History Narrative   Lives in home with husband. Daughters live out of state. Step-daughter in [place]. Has 8 grandchildren. Lives in 2 level home but most living is on first floor. Stairs have handrails, no problem with stairs. No grab bars in bathroom. No tripping hazards. Smoke alarms present.   No pets.    Eats a good variety of foods, husband is a cook. Eats meats, fruits, vegetables. Drinks water mostly. Occasional tea.   Goes to gym 5 times a week. Enjoys reading, sewing, movies. Walking when weather is nice.    Wears seat belt in car.    Social Determinants of Health   Financial Resource Strain: Low Risk  (03/09/2018)   Overall Financial Resource Strain (CARDIA)    Difficulty of Paying Living Expenses: Not hard at all  Food Insecurity: No Food Insecurity (03/09/2018)   Hunger Vital Sign    Worried About Running Out of Food in the Last Year: Never true    Ran Out of Food in the Last Year: Never true  Transportation Needs: No Transportation Needs (11/15/2021)   PRAPARE - Hydrologist (Medical): No    Lack of Transportation (Non-Medical): No   Physical Activity: Sufficiently Active (03/09/2018)   Exercise Vital Sign    Days of Exercise per Week: 5 days    Minutes of Exercise per Session: 90 min  Stress: No Stress Concern Present (03/09/2018)   [place]  Feeling of Stress : Not at all  Social Connections: Moderately Integrated (03/09/2018)   Social Connection and Isolation Panel [NHANES]    Frequency of Communication with Friends and Family: More than three times a week    Frequency of Social Gatherings with Friends and Family: More than three times a week    Attends Religious Services: Never    Marine scientist or Organizations: Yes    Attends Music therapist: More than 4 times per year    Marital Status: Married  Human resources officer Violence: Not At Risk (03/09/2018)   Humiliation, Afraid, Rape, and Kick questionnaire    Fear of Current or Ex-Partner: No    Emotionally Abused: No    Physically Abused: No    Sexually Abused: No    Family History  Problem Relation Age of Onset   Asthma Maternal Aunt    Heart disease Mother    Diabetes Mother    Heart disease Father    Stomach cancer Brother    Diabetes Brother    Colon cancer Neg Hx    Esophageal cancer Neg Hx    Rectal cancer Neg Hx     PHYSICAL EXAM: Vitals:   11/16/21 1433  BP: 114/72  Pulse: (!) 58  SpO2: 98%   General:  Well appearing. No resp difficulty HEENT: normal Neck: supple. no JVD. Carotids 2+ bilat; no bruits. No lymphadenopathy or thryomegaly appreciated. Cor: PMI nondisplaced. Regular rate & rhythm. No rubs, gallops or murmurs. Lungs: clear Abdomen: soft, nontender, nondistended. No hepatosplenomegaly. No bruits or masses. Good bowel sounds. Extremities: no cyanosis, clubbing, rash, edema Neuro: alert & orientedx3, cranial nerves grossly intact. moves all 4 extremities w/o difficulty. Affect pleasant  ECG: Sinus ryhthm No ST-T wave abnormalities.  Personally reviewed   ASSESSMENT & PLAN:  1. Exertional chest pain and progressive SOB with CAD s/p RCA stent in 11/2015: - symptoms concerning for progressive CAD. Will plan R/L cath early next week to further assess. Come to ER if symptoms worsen beforehand - cath 5/19 with mild non-obstructive CAD - Continue ASA and atorvastatin.  - Goal LDL < 70  2. Scleroderma - DLCO mildly reduced on PFTs 6/18. No R heart strain on echo. CT without ILD.  - RHC 5/19 no PAH - PFTs 12/20 with normalization of DLCO.  - No evidence of scleroderma-related lung disease (PAH or fibrosis).  - Echo and recent PFTs stable. No sign PAH - Will reassess with RHC next week  3. Hypertension - Blood pressure well controlled. Continue current regimen.  4. Valvular disease - mild MR and mild-mod AI - stable on previous echo  Glori Bickers, MD  2:58 PM

## 2021-11-16 NOTE — Progress Notes (Signed)
PCP: Dr. Andria Frames Referring: Dr. Kirke Corin    HPI:  Rhonda Brewer is a 71 y/o woman with h/o scleroderma, CAD s/p stent 9/17 , SVT s/p ablation 2/05 referred by Dr. Patrecia Pour for screening for [facility] in setting of scleroderma.   Previously lived in [place]. 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 At time had exertional fatigue and dyspnea and arm tingling. No CP.    Cath 11/24/15 LM: mild irregs LAD: 20% mid LCX: mild irreg RCA: mRCA 99% ->Xience Alpine DES 3.5x84m  Echo 11/22/15: LVEF 55-60% Mild AI. Normal RV. Mild TR.  Echo 08/27/16: EF 60-65%,  RV normal Mild AI. No RV strain or PAH.   PFTs 08/2016 FEV1 2.11 (88%) FVC 2.86 (91%) DLCO 63%  PFTs 12/20 FEV1 2.32 (99%) FVC 2.90 (94%) DLCO 85%  PFTs 12/22 FEV1 2.05L (90%) FVC 2.63L (87%) DLCO 76%  PFTs 6/23  FEV1 2.17 (95%) FVC 2.75 (91%) DLCO 82%  She was seen in the HF clinic for initial evaluation in June 2018. It was felt that her dyspnea could be due to Brilinta. She was switched to Plavix. Chest CT was ordered and showed no interstitial lung disease.   Cath 5/19 which showed very mild CAD. No evidence. There was a small step-up in saturations at RA level suggestive of anomalous pulmonary vein or shunt at atrial level   Mid RCA lesion is 20% stenosed. Prox LAD lesion is 40% stenosed.   Repeat hi-res CT on 09/07/21. No ILD.  3v coronary calcium   Here for acute visit. Wednesday she woke up and had chest discomfort which persisted all morning. Finally went to ER. ECG ok. Hstrop 9. Had chest CT. No PE. Mild septal thickening possible edema. No recurrent CP but now SOB with even mild activity. Has not gone back to her exercise program. Feels like she can't get a full breath. Very tired. No orthopnea or PND. [place]     Studies:  Echo  2/23 : 55-60% mild MR mild to mod AI  RV normal Personally reviewed Echo 1/22 EF 55-60% mild MR, mild to mod AI Personally reviewed   Cath 5/19   Ao = 137/66 (94) LV = 141/13 RA = 2 RV = 20/4 PA = 19/6 (12) PCW = 7 Fick cardiac output/index = 5.3/3.1 Thermo CO/CI = 3.6/2.2 PVR = 0.7 WU SVR 1402 Ao sat = 99% PA sat = 79%, 80% High SVC sat = 68% RA sat = 78% RV sat = 75% Qp/Qs = 1.11 Assessment:   1. CAD with patent RCA stent and mild non-obstructive CAD in LAD 2. Normal LVEF 60-65% 3. Normal right heart pressures with no evidence of PAH 4. Small step-up in saturations at RA level suggestive of anomalous pulmonary vein or shunt at atrial level      Past Medical History:  Diagnosis Date   Allergy    Arthritis    CAD (coronary artery disease)    a. DES to RCA 11/2015   Cancer ([facility]    Uterine 2008   CHF (congestive heart failure) (HCC)    Heart murmur    Hyperlipidemia    Osteoporosis    Thyroid disease     Current Outpatient Medications  Medication Sig Dispense Refill   albuterol (VENTOLIN HFA) 108 (90 Base)  MCG/ACT inhaler Inhale 2 puffs into the lungs every 4 (four) hours as needed for wheezing or shortness of breath. 18 g 6   aspirin EC 81 MG tablet Take 81 mg by mouth daily.      atorvastatin (LIPITOR) 80 MG tablet TAKE 1 TABLET BY MOUTH  DAILY 90 tablet 3   Calcium Carbonate-Vitamin D (CALCIUM-VITAMIN D3 PO) Take 1 tablet by mouth daily.     cycloSPORINE (RESTASIS) 0.05 % ophthalmic emulsion Place 1 drop into both eyes 2 (two) times daily.     levothyroxine (SYNTHROID) 75 MCG tablet TAKE 1 TABLET BY MOUTH  DAILY BEFORE BREAKFAST 90 tablet 3   Multiple Vitamin (MULTI-VITAMINS) TABS Take 1 tablet by mouth daily.      pantoprazole (PROTONIX) 40 MG tablet Take 1 tablet (40 mg total) by mouth 2 (two) times daily before a meal. Appointment needed for refills 180 tablet 3   vitamin B-12 (CYANOCOBALAMIN) 100 MCG tablet Take 100 mcg by mouth daily.     No current  facility-administered medications for this encounter.     Allergies  Allergen Reactions   Avelox [Moxifloxacin Hcl In Nacl] Other (See Comments)    dizziness   Other Other (See Comments)    SEASONAL     Social History   Socioeconomic History   Marital status: Married    Spouse name: Cay Schillings   Number of children: 2   Years of education: 12   Highest education level: 12th grade  Occupational History   Occupation: retired    Comment: Scientist, water quality  Tobacco Use   Smoking status: Never    Passive exposure: Never   Smokeless tobacco: Never  Vaping Use   Vaping Use: Never used  Substance and Sexual Activity   Alcohol use: Yes    Comment: occasional wine   Drug use: No   Sexual activity: Yes    Birth control/protection: Surgical    Comment: hysterectomy  Other Topics Concern   Not on file  Social History Narrative   Lives in home with husband. Daughters live out of state. Step-daughter in [place]. Has 8 grandchildren. Lives in 2 level home but most living is on first floor. Stairs have handrails, no problem with stairs. No grab bars in bathroom. No tripping hazards. Smoke alarms present.   No pets.    Eats a good variety of foods, husband is a cook. Eats meats, fruits, vegetables. Drinks water mostly. Occasional tea.   Goes to gym 5 times a week. Enjoys reading, sewing, movies. Walking when weather is nice.    Wears seat belt in car.    Social Determinants of Health   Financial Resource Strain: Low Risk  (03/09/2018)   Overall Financial Resource Strain (CARDIA)    Difficulty of Paying Living Expenses: Not hard at all  Food Insecurity: No Food Insecurity (03/09/2018)   Hunger Vital Sign    Worried About Running Out of Food in the Last Year: Never true    Ran Out of Food in the Last Year: Never true  Transportation Needs: No Transportation Needs (11/15/2021)   PRAPARE - Hydrologist (Medical): No    Lack of Transportation (Non-Medical): No   Physical Activity: Sufficiently Active (03/09/2018)   Exercise Vital Sign    Days of Exercise per Week: 5 days    Minutes of Exercise per Session: 90 min  Stress: No Stress Concern Present (03/09/2018)   [place]  Feeling of Stress : Not at all  Social Connections: Moderately Integrated (03/09/2018)   Social Connection and Isolation Panel [NHANES]    Frequency of Communication with Friends and Family: More than three times a week    Frequency of Social Gatherings with Friends and Family: More than three times a week    Attends Religious Services: Never    Marine scientist or Organizations: Yes    Attends Music therapist: More than 4 times per year    Marital Status: Married  Human resources officer Violence: Not At Risk (03/09/2018)   Humiliation, Afraid, Rape, and Kick questionnaire    Fear of Current or Ex-Partner: No    Emotionally Abused: No    Physically Abused: No    Sexually Abused: No    Family History  Problem Relation Age of Onset   Asthma Maternal Aunt    Heart disease Mother    Diabetes Mother    Heart disease Father    Stomach cancer Brother    Diabetes Brother    Colon cancer Neg Hx    Esophageal cancer Neg Hx    Rectal cancer Neg Hx     PHYSICAL EXAM: Vitals:   11/16/21 1433  BP: 114/72  Pulse: (!) 58  SpO2: 98%   General:  Well appearing. No resp difficulty HEENT: normal Neck: supple. no JVD. Carotids 2+ bilat; no bruits. No lymphadenopathy or thryomegaly appreciated. Cor: PMI nondisplaced. Regular rate & rhythm. No rubs, gallops or murmurs. Lungs: clear Abdomen: soft, nontender, nondistended. No hepatosplenomegaly. No bruits or masses. Good bowel sounds. Extremities: no cyanosis, clubbing, rash, edema Neuro: alert & orientedx3, cranial nerves grossly intact. moves all 4 extremities w/o difficulty. Affect pleasant  ECG: Sinus ryhthm No ST-T wave abnormalities.  Personally reviewed   ASSESSMENT & PLAN:  1. Exertional chest pain and progressive SOB with CAD s/p RCA stent in 11/2015: - symptoms concerning for progressive CAD. Will plan R/L cath early next week to further assess. Come to ER if symptoms worsen beforehand - cath 5/19 with mild non-obstructive CAD - Continue ASA and atorvastatin.  - Goal LDL < 70  2. Scleroderma - DLCO mildly reduced on PFTs 6/18. No R heart strain on echo. CT without ILD.  - RHC 5/19 no PAH - PFTs 12/20 with normalization of DLCO.  - No evidence of scleroderma-related lung disease (PAH or fibrosis).  - Echo and recent PFTs stable. No sign PAH - Will reassess with RHC next week  3. Hypertension - Blood pressure well controlled. Continue current regimen.  4. Valvular disease - mild MR and mild-mod AI - stable on previous echo  Glori Bickers, MD  2:58 PM

## 2021-11-20 ENCOUNTER — Other Ambulatory Visit: Payer: Self-pay

## 2021-11-20 ENCOUNTER — Encounter (HOSPITAL_COMMUNITY)
Admission: RE | Disposition: A | Discharge status: Home / Self Care | Payer: Self-pay | Source: Home / Self Care | Attending: Internal Medicine

## 2021-11-20 ENCOUNTER — Encounter (HOSPITAL_COMMUNITY): Payer: Self-pay | Admitting: Internal Medicine

## 2021-11-20 ENCOUNTER — Ambulatory Visit (HOSPITAL_COMMUNITY)
Admission: RE | Admit: 2021-11-20 | Discharge: 2021-11-20 | Disposition: A | Discharge status: Home / Self Care | Payer: Medicare Other | Attending: Internal Medicine | Admitting: Internal Medicine

## 2021-11-20 DIAGNOSIS — R0602 Shortness of breath: Secondary | ICD-10-CM | POA: Diagnosis not present

## 2021-11-20 DIAGNOSIS — M349 Systemic sclerosis, unspecified: Secondary | ICD-10-CM | POA: Diagnosis not present

## 2021-11-20 DIAGNOSIS — Z7982 Long term (current) use of aspirin: Secondary | ICD-10-CM | POA: Diagnosis not present

## 2021-11-20 DIAGNOSIS — I272 Pulmonary hypertension, unspecified: Secondary | ICD-10-CM

## 2021-11-20 DIAGNOSIS — I2510 Atherosclerotic heart disease of native coronary artery without angina pectoris: Secondary | ICD-10-CM | POA: Insufficient documentation

## 2021-11-20 DIAGNOSIS — Z955 Presence of coronary angioplasty implant and graft: Secondary | ICD-10-CM | POA: Insufficient documentation

## 2021-11-20 DIAGNOSIS — R0789 Other chest pain: Secondary | ICD-10-CM

## 2021-11-20 DIAGNOSIS — I10 Essential (primary) hypertension: Secondary | ICD-10-CM | POA: Insufficient documentation

## 2021-11-20 DIAGNOSIS — Z79899 Other long term (current) drug therapy: Secondary | ICD-10-CM | POA: Insufficient documentation

## 2021-11-20 DIAGNOSIS — I25119 Atherosclerotic heart disease of native coronary artery with unspecified angina pectoris: Secondary | ICD-10-CM

## 2021-11-20 DIAGNOSIS — I080 Rheumatic disorders of both mitral and aortic valves: Secondary | ICD-10-CM | POA: Insufficient documentation

## 2021-11-20 HISTORY — PX: RIGHT/LEFT HEART CATH AND CORONARY ANGIOGRAPHY: CATH118266

## 2021-11-20 LAB — POCT I-STAT 7, (LYTES, BLD GAS, ICA,H+H)
Acid-base deficit: 1 mmol/L (ref 0.0–2.0)
Bicarbonate: 24.6 mmol/L (ref 20.0–28.0)
Calcium, Ion: 1.21 mmol/L (ref 1.15–1.40)
HCT: 37 % (ref 36.0–46.0)
Hemoglobin: 12.6 g/dL (ref 12.0–15.0)
O2 Saturation: 99 %
Potassium: 3.8 mmol/L (ref 3.5–5.1)
Sodium: 142 mmol/L (ref 135–145)
TCO2: 26 mmol/L (ref 22–32)
pCO2 arterial: 41.7 mmHg (ref 32–48)
pH, Arterial: 7.38 (ref 7.35–7.45)
pO2, Arterial: 151 mmHg — ABNORMAL HIGH (ref 83–108)

## 2021-11-20 LAB — POCT I-STAT EG7
Acid-Base Excess: 0 mmol/L (ref 0.0–2.0)
Acid-base deficit: 2 mmol/L (ref 0.0–2.0)
Acid-base deficit: 1 mmol/L (ref 0.0–2.0)
Acid-base deficit: 4 mmol/L — ABNORMAL HIGH (ref 0.0–2.0)
Bicarbonate: 24.2 mmol/L (ref 20.0–28.0)
Bicarbonate: 26.1 mmol/L (ref 20.0–28.0)
Bicarbonate: 21.7 mmol/L (ref 20.0–28.0)
Bicarbonate: 24.4 mmol/L (ref 20.0–28.0)
Calcium, Ion: 1.21 mmol/L (ref 1.15–1.40)
Calcium, Ion: 1.27 mmol/L (ref 1.15–1.40)
Calcium, Ion: 0.83 mmol/L — CL (ref 1.15–1.40)
Calcium, Ion: 1.25 mmol/L (ref 1.15–1.40)
HCT: 38 % (ref 36.0–46.0)
HCT: 38 % (ref 36.0–46.0)
HCT: 30 % — ABNORMAL LOW (ref 36.0–46.0)
HCT: 38 % (ref 36.0–46.0)
Hemoglobin: 12.9 g/dL (ref 12.0–15.0)
Hemoglobin: 12.9 g/dL (ref 12.0–15.0)
Hemoglobin: 10.2 g/dL — ABNORMAL LOW (ref 12.0–15.0)
Hemoglobin: 12.9 g/dL (ref 12.0–15.0)
O2 Saturation: 77 %
O2 Saturation: 80 %
O2 Saturation: 72 %
O2 Saturation: 76 %
Potassium: 3.8 mmol/L (ref 3.5–5.1)
Potassium: 4 mmol/L (ref 3.5–5.1)
Potassium: 2.9 mmol/L — ABNORMAL LOW (ref 3.5–5.1)
Potassium: 3.9 mmol/L (ref 3.5–5.1)
Sodium: 138 mmol/L (ref 135–145)
Sodium: 141 mmol/L (ref 135–145)
Sodium: 141 mmol/L (ref 135–145)
Sodium: 148 mmol/L — ABNORMAL HIGH (ref 135–145)
TCO2: 26 mmol/L (ref 22–32)
TCO2: 27 mmol/L (ref 22–32)
TCO2: 23 mmol/L (ref 22–32)
TCO2: 26 mmol/L (ref 22–32)
pCO2, Ven: 44.4 mmHg (ref 44–60)
pCO2, Ven: 47.2 mmHg (ref 44–60)
pCO2, Ven: 38.9 mmHg — ABNORMAL LOW (ref 44–60)
pCO2, Ven: 44 mmHg (ref 44–60)
pH, Ven: 7.345 (ref 7.25–7.43)
pH, Ven: 7.35 (ref 7.25–7.43)
pH, Ven: 7.352 (ref 7.25–7.43)
pH, Ven: 7.354 (ref 7.25–7.43)
pO2, Ven: 44 mmHg (ref 32–45)
pO2, Ven: 47 mmHg — ABNORMAL HIGH (ref 32–45)
pO2, Ven: 39 mmHg (ref 32–45)
pO2, Ven: 43 mmHg (ref 32–45)

## 2021-11-20 SURGERY — RIGHT/LEFT HEART CATH AND CORONARY ANGIOGRAPHY
Anesthesia: LOCAL

## 2021-11-20 MED ORDER — ASPIRIN 81 MG PO CHEW
81.0000 mg | CHEWABLE_TABLET | ORAL | Status: AC
  Administered 2021-11-20: 81 mg via ORAL
  Filled 2021-11-20: qty 1

## 2021-11-20 MED ORDER — HEPARIN SODIUM (PORCINE) 1000 UNIT/ML IJ SOLN
INTRAMUSCULAR | Status: AC
  Filled 2021-11-20: qty 10

## 2021-11-20 MED ORDER — LABETALOL HCL 5 MG/ML IV SOLN: 10.0000 mg | INTRAVENOUS | Status: DC | PRN

## 2021-11-20 MED ORDER — SODIUM CHLORIDE 0.9 % IV SOLN: INTRAVENOUS | Status: DC

## 2021-11-20 MED ORDER — SODIUM CHLORIDE 0.9% FLUSH: 3.0000 mL | INTRAVENOUS | Status: DC | PRN

## 2021-11-20 MED ORDER — SODIUM CHLORIDE 0.9 % IV SOLN: 250.0000 mL | INTRAVENOUS | Status: DC | PRN

## 2021-11-20 MED ORDER — ONDANSETRON HCL 4 MG/2ML IJ SOLN: 4.0000 mg | Freq: Four times a day (QID) | INTRAMUSCULAR | Status: DC | PRN

## 2021-11-20 MED ORDER — HEPARIN (PORCINE) IN NACL 1000-0.9 UT/500ML-% IV SOLN
INTRAVENOUS | Status: AC
  Filled 2021-11-20: qty 500

## 2021-11-20 MED ORDER — MIDAZOLAM HCL 2 MG/2ML IJ SOLN
INTRAMUSCULAR | Status: AC
  Filled 2021-11-20: qty 2

## 2021-11-20 MED ORDER — SODIUM CHLORIDE 0.9% FLUSH: 3.0000 mL | Freq: Two times a day (BID) | INTRAVENOUS | Status: DC

## 2021-11-20 MED ORDER — ACETAMINOPHEN 325 MG PO TABS: 650.0000 mg | ORAL_TABLET | ORAL | Status: DC | PRN

## 2021-11-20 MED ORDER — ASPIRIN 81 MG PO CHEW: 81.0000 mg | CHEWABLE_TABLET | ORAL | Status: DC

## 2021-11-20 MED ORDER — LIDOCAINE HCL (PF) 1 % IJ SOLN
INTRAMUSCULAR | Status: DC | PRN
  Administered 2021-11-20 (×2): 2 mL
  Administered 2021-11-20: 5 mL

## 2021-11-20 MED ORDER — HEPARIN SODIUM (PORCINE) 1000 UNIT/ML IJ SOLN
INTRAMUSCULAR | Status: DC | PRN
  Administered 2021-11-20: 3000 [IU] via INTRAVENOUS

## 2021-11-20 MED ORDER — SODIUM CHLORIDE 0.9 % IV SOLN: INTRAVENOUS | Status: AC

## 2021-11-20 MED ORDER — FENTANYL CITRATE (PF) 100 MCG/2ML IJ SOLN
INTRAMUSCULAR | Status: DC | PRN
  Administered 2021-11-20: 25 ug via INTRAVENOUS

## 2021-11-20 MED ORDER — FENTANYL CITRATE (PF) 100 MCG/2ML IJ SOLN
INTRAMUSCULAR | Status: AC
  Filled 2021-11-20: qty 2

## 2021-11-20 MED ORDER — VERAPAMIL HCL 2.5 MG/ML IV SOLN
INTRAVENOUS | Status: AC
Start: 2021-11-20 — End: ?
  Filled 2021-11-20: qty 2

## 2021-11-20 MED ORDER — HYDRALAZINE HCL 20 MG/ML IJ SOLN: 10.0000 mg | INTRAMUSCULAR | Status: DC | PRN

## 2021-11-20 MED ORDER — HEPARIN (PORCINE) IN NACL 1000-0.9 UT/500ML-% IV SOLN
INTRAVENOUS | Status: DC | PRN
  Administered 2021-11-20 (×2): 500 mL

## 2021-11-20 MED ORDER — IOHEXOL 350 MG/ML SOLN
INTRAVENOUS | Status: DC | PRN
  Administered 2021-11-20: 45 mL

## 2021-11-20 MED ORDER — MIDAZOLAM HCL 2 MG/2ML IJ SOLN
INTRAMUSCULAR | Status: DC | PRN
  Administered 2021-11-20: 2 mg via INTRAVENOUS

## 2021-11-20 MED ORDER — LIDOCAINE HCL (PF) 1 % IJ SOLN
INTRAMUSCULAR | Status: AC
  Filled 2021-11-20: qty 30

## 2021-11-20 MED ORDER — VERAPAMIL HCL 2.5 MG/ML IV SOLN
INTRAVENOUS | Status: DC | PRN
  Administered 2021-11-20: 10 mL via INTRA_ARTERIAL

## 2021-11-20 SURGICAL SUPPLY — 14 items
BAND ZEPHYR COMPRESS 30 LONG (HEMOSTASIS) IMPLANT
CATH 5FR JL3.5 JR4 ANG PIG MP (CATHETERS) IMPLANT
CATH SWAN GANZ 7F STRAIGHT (CATHETERS) IMPLANT
GLIDESHEATH SLEND SS 6F .021 (SHEATH) IMPLANT
GUIDEWIRE INQWIRE 1.5J.035X260 (WIRE) IMPLANT
INQWIRE 1.5J .035X260CM (WIRE) ×1
KIT HEART LEFT (KITS) IMPLANT
PACK CARDIAC CATHETERIZATION (CUSTOM PROCEDURE TRAY) ×1 IMPLANT
SHEATH GLIDE SLENDER 4/5FR (SHEATH) IMPLANT
SHEATH PINNACLE 7F 10CM (SHEATH) IMPLANT
SHEATH PROBE COVER 6X72 (BAG) IMPLANT
TRANSDUCER W/STOPCOCK (MISCELLANEOUS) ×1 IMPLANT
WIRE HI TORQ VERSACORE-J 145CM (WIRE) IMPLANT
WIRE MICRO SET SILHO 5FR 7 (SHEATH) IMPLANT

## 2021-11-20 NOTE — Interval H&P Note (Signed)
History and Physical Interval Note:  11/20/2021 8:43 AM  Rhonda Brewer  has presented today for surgery, with the diagnosis of pumlmonary htn, chest pain.  The various methods of treatment have been discussed with the patient and family. After consideration of risks, benefits and other options for treatment, the patient has consented to  Procedure(s): RIGHT/LEFT HEART CATH AND CORONARY ANGIOGRAPHY ([name]) as a surgical intervention.  The patient's history has been reviewed, patient examined, no change in status, stable for surgery.  I have reviewed the patient's chart and labs.  Questions were answered to the patient's satisfaction.     Treniya Lobb

## 2021-11-20 NOTE — Progress Notes (Signed)
Dr Haroldine Laws to bedside to evaluate pt, ok for transfer to short stay and discharge home per orders, safety maintained, informed pt to ACE wrap rfa when home and bruising to be expected

## 2021-11-20 NOTE — Progress Notes (Signed)
Dr Haroldine Laws at  bedside, BP cuff removed by doctor, zephyr band remains in place, with 7cc of air,  cuff repositioned and re-inflated to approximately 55mHg, arms remains elevated on blankets, safety maintained, per dr remove cuff in 10 minutes and restart deflation to zephyr band

## 2021-11-20 NOTE — Progress Notes (Signed)
Patient and husband was given discharge instructions. Both verbalized understanding. 

## 2021-11-22 ENCOUNTER — Encounter (HOSPITAL_COMMUNITY): Payer: Self-pay | Admitting: Internal Medicine

## 2021-11-27 DIAGNOSIS — M654 Radial styloid tenosynovitis [de Quervain]: Secondary | ICD-10-CM | POA: Diagnosis not present

## 2021-11-28 ENCOUNTER — Encounter (HOSPITAL_COMMUNITY): Payer: Self-pay | Admitting: Internal Medicine

## 2021-11-28 ENCOUNTER — Other Ambulatory Visit (HOSPITAL_COMMUNITY): Payer: Self-pay | Admitting: *Deleted

## 2021-11-28 DIAGNOSIS — M79601 Pain in right arm: Secondary | ICD-10-CM

## 2021-11-29 ENCOUNTER — Ambulatory Visit (HOSPITAL_COMMUNITY)
Admission: RE | Admit: 2021-11-29 | Discharge: 2021-11-29 | Disposition: A | Discharge status: Home / Self Care | Payer: Medicare Other | Source: Ambulatory Visit | Attending: Internal Medicine | Admitting: Internal Medicine

## 2021-11-29 DIAGNOSIS — M79601 Pain in right arm: Secondary | ICD-10-CM | POA: Diagnosis not present

## 2021-11-29 NOTE — Progress Notes (Signed)
Upper extremity venous has been completed.   Preliminary results in CV Proc.   Rhonda Brewer Rhonda Brewer 11/29/2021 2:39 PM

## 2021-12-03 ENCOUNTER — Encounter: Payer: Self-pay | Admitting: Pharmacist

## 2021-12-03 DIAGNOSIS — I2510 Atherosclerotic heart disease of native coronary artery without angina pectoris: Secondary | ICD-10-CM

## 2021-12-03 DIAGNOSIS — E785 Hyperlipidemia, unspecified: Secondary | ICD-10-CM

## 2021-12-04 ENCOUNTER — Telehealth: Payer: Self-pay | Admitting: Pharmacist

## 2021-12-04 ENCOUNTER — Other Ambulatory Visit: Payer: Self-pay | Admitting: Pharmacist

## 2021-12-04 DIAGNOSIS — E785 Hyperlipidemia, unspecified: Secondary | ICD-10-CM

## 2021-12-04 DIAGNOSIS — I2510 Atherosclerotic heart disease of native coronary artery without angina pectoris: Secondary | ICD-10-CM

## 2021-12-04 MED ORDER — REPATHA SURECLICK 140 MG/ML ~~LOC~~ SOAJ: 1.0000 mL | SUBCUTANEOUS | 3 refills | Status: DC

## 2021-12-04 NOTE — Telephone Encounter (Signed)
PA for Repatha submitted.  Key:  [id number].  PA approved through 06/04/22

## 2021-12-31 ENCOUNTER — Encounter: Payer: Self-pay | Admitting: Family Medicine

## 2021-12-31 ENCOUNTER — Ambulatory Visit (INDEPENDENT_AMBULATORY_CARE_PROVIDER_SITE_OTHER): Payer: Medicare Other | Admitting: Family Medicine

## 2021-12-31 VITALS — BP 126/59 | HR 63 | Temp 99.1°F | Ht 64.0 in | Wt 145.6 lb

## 2021-12-31 DIAGNOSIS — E785 Hyperlipidemia, unspecified: Secondary | ICD-10-CM

## 2021-12-31 DIAGNOSIS — E039 Hypothyroidism, unspecified: Secondary | ICD-10-CM

## 2021-12-31 DIAGNOSIS — Z0000 Encounter for general adult medical examination without abnormal findings: Secondary | ICD-10-CM | POA: Diagnosis not present

## 2021-12-31 DIAGNOSIS — M341 CR(E)ST syndrome: Secondary | ICD-10-CM

## 2021-12-31 DIAGNOSIS — Z23 Encounter for immunization: Secondary | ICD-10-CM | POA: Diagnosis not present

## 2021-12-31 DIAGNOSIS — I2510 Atherosclerotic heart disease of native coronary artery without angina pectoris: Secondary | ICD-10-CM

## 2021-12-31 NOTE — Patient Instructions (Signed)
When you get your cholesterol checked, please ask them to do a thyroid check (TSH).   You need the same immunizations as Geoff.  Tetanus, shingles and COVID booster.   Keep living your best lives.

## 2022-01-01 ENCOUNTER — Encounter: Payer: Self-pay | Admitting: Family Medicine

## 2022-01-01 DIAGNOSIS — D2371 Other benign neoplasm of skin of right lower limb, including hip: Secondary | ICD-10-CM | POA: Diagnosis not present

## 2022-01-01 DIAGNOSIS — L821 Other seborrheic keratosis: Secondary | ICD-10-CM | POA: Diagnosis not present

## 2022-01-01 DIAGNOSIS — L819 Disorder of pigmentation, unspecified: Secondary | ICD-10-CM | POA: Diagnosis not present

## 2022-01-01 NOTE — Assessment & Plan Note (Signed)
Recent cath confirms non obstructive CAD.  Continue medical management.

## 2022-01-01 NOTE — Assessment & Plan Note (Signed)
On atorvastatin and Repatha.  Now managed by lipid clinic.

## 2022-01-01 NOTE — Progress Notes (Signed)
    SUBJECTIVE:   CHIEF COMPLAINT / HPI:   Annual exam: Acute concerns: none Chronic problems: Known CAD with stent.  Hospitalized early Sept with chest pain.  Had cath.  Non obstructive CAD, medical management.  No chest pain or DOEOn ASA and statin. See below. Hypercholesterolemia.  Secondary prevention.  Not at goal on atorvastatin 80.  Recently added Repatha.  Will folllow up with lipid clinic in January. CREST syndrome.  Stable.  Followed by rheumatology.  Hypothyroidism.  On replacement therapy.  Reviewed normal TSH lest than one year ago.   HPDP up to date except for some immunizations.  Will get flu shot today.  Lots of recent lab work with 9/23 hospitalization  PERTINENT  PMH / PSH: Denies headache, chest pain, DOE, bleeding, lumps or skin lesions, weakness or numbness.  Denies change in vision, bowel, bladder, appetite, or weight.  OBJECTIVE:   BP (!) 126/59   Pulse 63   Temp 99.1 F (37.3 C) (Oral)   Ht '5\' 4"'$  (1.626 m)   Wt 145 lb 9.6 oz (66 kg)   SpO2 98%   BMI 24.99 kg/m   VS noted. Neck supple Lungs clear Cardiac RRR without m or g Abd benign Ext no edema. Neuro, motor, sensory, gait, cognition and affect all grossly normal.    ASSESSMENT/PLAN:   Preventative health care Healthy female with no bad habits and carefully manages known chronic medical problemms.    CAD (coronary artery disease), native coronary artery Recent cath confirms non obstructive CAD.  Continue medical management.  Hyperlipidemia On atorvastatin and Repatha.  Now managed by lipid clinic.    CREST syndrome (HCC) Stable.  Managed by rheum.    Hypothyroidism Recent TSH and symptoms suggest euthyroid on adaquate replacement.     [name], MD [place]

## 2022-01-01 NOTE — Assessment & Plan Note (Signed)
Stable.  Managed by rheum.

## 2022-01-01 NOTE — Assessment & Plan Note (Signed)
Recent TSH and symptoms suggest euthyroid on adaquate replacement.

## 2022-01-01 NOTE — Assessment & Plan Note (Signed)
Healthy female with no bad habits and carefully manages known chronic medical problemms.

## 2022-01-02 NOTE — Progress Notes (Signed)
Office Visit Note  Patient: Rhonda Brewer             Date of Birth: 03-21-1950           MRN: [id number]             PCP: [name], MD Referring: [name], MD Visit Date: 01/16/2022 Occupation: '@GUAROCC'$ @  Subjective:  Follow-up   History of Present Illness: Rhonda Brewer is a 71 y.o. female with history of limited systemic sclerosis.  She states she continues to have Raynauds symptoms especially with the weather change.  She was evaluated by Dr. Haroldine Laws in September 2023.  She had echocardiogram which was negative for pulmonary artery hypertension.  She also has coronary artery disease and history of a stent.  She was placed on Repatha.  She was evaluated by Dr. Chase Caller in June 2023.  There was no evidence of ILD on high-resolution CT obtained on August 30, 2021.  Early liver cirrhosis was noted.  Denies any history of dysphagia, dysphonia, joint swelling or skin tightness.  There is no history of digital ulcers.  Activities of Daily Living:  Patient reports morning stiffness for 5-10 minutes.   Patient Denies nocturnal pain.  Difficulty dressing/grooming: Denies Difficulty climbing stairs: Denies Difficulty getting out of chair: Denies Difficulty using hands for taps, buttons, cutlery, and/or writing: Denies  Review of Systems  Constitutional:  Negative for fatigue.  HENT:  Positive for mouth dryness. Negative for mouth sores.   Eyes:  Positive for dryness.  Respiratory:  Negative for shortness of breath.   Cardiovascular:  Negative for chest pain and palpitations.  Gastrointestinal:  Positive for constipation. Negative for blood in stool and diarrhea.  Endocrine: Negative for increased urination.  Genitourinary:  Negative for involuntary urination.  Musculoskeletal:  Positive for morning stiffness. Negative for joint pain, gait problem, joint pain, joint swelling, myalgias, muscle weakness, muscle tenderness and myalgias.  Skin:  Negative for color change,  rash, hair loss and sensitivity to sunlight.  Allergic/Immunologic: Negative for susceptible to infections.  Neurological:  Negative for dizziness and headaches.  Hematological:  Negative for swollen glands.  Psychiatric/Behavioral:  Positive for sleep disturbance. Negative for depressed mood. The patient is not nervous/anxious.     PMFS History:  Patient Active Problem List   Diagnosis Date Noted   Abnormal liver CT 09/04/2021   Family history of diabetes mellitus (DM) 12/07/2020   De Quervain's tenosynovitis, left 12/07/2020   Genital herpes simplex 09/13/2020   Preventative health care 12/01/2019   Chronic interstitial cystitis 04/09/2019   Heart disease 01/08/2019   Hypertensive disorder 01/08/2019   Hypothyroidism 01/08/2019   Hyperlipidemia 01/08/2019   Constipation 11/12/2018   Carpal tunnel syndrome 05/29/2017   Low back pain without sciatica 04/29/2017   Screening for breast cancer 09/04/2016   H/O cardiac radiofrequency ablation 06/26/2016   CAD (coronary artery disease), native coronary artery 06/19/2016   History of uterine cancer 06/19/2016   Scleroderma ([place]) 06/16/2016   High risk medication use 06/16/2016   Osteopenia of multiple sites 06/16/2016   Raynaud's phenomenon 09/22/2012   CREST syndrome ([place]) 04/08/2011    Past Medical History:  Diagnosis Date   Allergy    Arthritis    CAD (coronary artery disease)    a. DES to RCA 11/2015   Cancer [facility])    Uterine 2008   CHF (congestive heart failure) (HCC)    Heart murmur    Hyperlipidemia    Osteoporosis    Thyroid disease  Family History  Problem Relation Age of Onset   Asthma Maternal Aunt    Heart disease Mother    Diabetes Mother    Heart disease Father    Stomach cancer Brother    Diabetes Brother    Colon cancer Neg Hx    Esophageal cancer Neg Hx    Rectal cancer Neg Hx    Past Surgical History:  Procedure Laterality Date   ABDOMINAL HYSTERECTOMY     APPENDECTOMY     CARDIAC  ELECTROPHYSIOLOGY MAPPING AND ABLATION  04/19/2003   CARPAL TUNNEL RELEASE Right 10/15/2017   CATARACT EXTRACTION Bilateral    07/2021 R 08/2021 L   CORONARY ANGIOPLASTY WITH STENT PLACEMENT     RIGHT/LEFT HEART CATH AND CORONARY ANGIOGRAPHY [name] 08/14/2017   Procedure: RIGHT/LEFT HEART CATH AND CORONARY ANGIOGRAPHY;  Surgeon: Jolaine Artist, MD;  Location: [place] [facility];  Service: Cardiovascular;  Laterality: [name];   RIGHT/LEFT HEART CATH AND CORONARY ANGIOGRAPHY [name] 11/20/2021   Procedure: RIGHT/LEFT HEART CATH AND CORONARY ANGIOGRAPHY;  Surgeon: Jolaine Artist, MD;  Location: [place] [facility];  Service: Cardiovascular;  Laterality: [name];   ULTRASOUND GUIDANCE FOR VASCULAR ACCESS  08/14/2017   Procedure: Ultrasound Guidance For Vascular Access;  Surgeon: Jolaine Artist, MD;  Location: [place] [facility];  Service: Cardiovascular;;   Social History   Social History Narrative   Lives in home with husband. Daughters live out of state. Step-daughter in [place]. Has 8 grandchildren. Lives in 2 level home but most living is on first floor. Stairs have handrails, no problem with stairs. No grab bars in bathroom. No tripping hazards. Smoke alarms present.   No pets.    Eats a good variety of foods, husband is a cook. Eats meats, fruits, vegetables. Drinks water mostly. Occasional tea.   Goes to gym 5 times a week. Enjoys reading, sewing, movies. Walking when weather is nice.    Wears seat belt in car.    Immunization History  Administered Date(s) Administered   Fluad Quad(high Dose 65+) 11/29/2019, 12/07/2020, 12/31/2021   Influenza Split 01/17/2016   Influenza,inj,Quad PF,6+ Mos 11/12/2018   Influenza-Unspecified 04/02/2017, 12/08/2017   Moderna Covid-19 Vaccine Bivalent Booster 86yr & up 01/31/2021   Moderna SARS-COV2 Booster Vaccination 01/13/2020, 09/25/2020   Moderna Sars-Covid-2 Vaccination 04/09/2019, 05/14/2019   Pneumococcal Conjugate-13 10/26/2014   Pneumococcal  Polysaccharide-23 11/12/2018   Pneumococcal-Unspecified 09/14/2013   Td 07/18/2011     Objective: Vital Signs: BP 126/76 (BP Location: Left Arm, Patient Position: Sitting, Cuff Size: Normal)   Pulse 66   Resp 15   Ht '5\' 4"'$  (1.626 m)   Wt 145 lb (65.8 kg)   BMI 24.89 kg/m    Physical Exam Vitals and nursing note reviewed.  Constitutional:      Appearance: She is well-developed.  HENT:     Head: Normocephalic and atraumatic.  Eyes:     Conjunctiva/sclera: Conjunctivae normal.  Cardiovascular:     Rate and Rhythm: Normal rate and regular rhythm.     Heart sounds: Normal heart sounds.  Pulmonary:     Effort: Pulmonary effort is normal.     Breath sounds: Normal breath sounds.  Abdominal:     General: Bowel sounds are normal.     Palpations: Abdomen is soft.  Musculoskeletal:     Cervical back: Normal range of motion.  Lymphadenopathy:     Cervical: No cervical adenopathy.  Skin:    General: Skin is warm and dry.  Capillary Refill: Capillary refill takes less than 2 seconds.     Comments: She had good capillary refill with no nailbed capillary dropout.  Telangiectasia were noted on her face lips and her palms.  No digital ulcers were noted.  Neurological:     Mental Status: She is alert and oriented to person, place, and time.  Psychiatric:        Behavior: Behavior normal.      Musculoskeletal Exam: Cervical, thoracic and lumbar spine were in good range of motion.  Shoulder joints, elbow joints, wrist joints, MCPs PIPs and DIPs been good range of motion with no synovitis.  Hip joints, knee joints, ankles, MTPs and PIPs been good range of motion with no synovitis.  CDAI Exam: CDAI Score: -- Patient Global: --; Provider Global: -- Swollen: --; Tender: -- Joint Exam 01/16/2022   No joint exam has been documented for this visit   There is currently no information documented on the homunculus. Go to the Rheumatology activity and complete the homunculus joint  exam.  Investigation: No additional findings.  Imaging: No results found.  Recent Labs: Lab Results  Component Value Date   WBC 6.2 11/16/2021   HGB 12.9 11/20/2021   PLT 235 11/16/2021   NA 141 11/20/2021   K 3.9 11/20/2021   CL 106 11/16/2021   CO2 22 11/16/2021   GLUCOSE 124 (H) 11/16/2021   BUN 15 11/16/2021   CREATININE 0.85 11/16/2021   BILITOT 0.7 10/03/2021   ALKPHOS 68 10/03/2021   AST 19 10/03/2021   ALT 16 10/03/2021   PROT 6.9 10/03/2021   ALBUMIN 4.1 10/03/2021   CALCIUM 9.9 11/16/2021   GFRAA 78 07/11/2020    Speciality Comments: PLQ Eye Exam: 08/07/17 PLQ toxcity,  PLQ discotinued @ [facility]  Procedures:  No procedures performed Allergies: Avelox [moxifloxacin hcl in nacl] and Other   Assessment / Plan:     Visit Diagnoses: Scleroderma ([place]) - Limited systemic with Raynauds, Telangiectasia, sclerodactyly, arthralgias, erosions in right fifth and left third DIP, ANA centromere: She had no synovitis on examination.  Sclerodactyly was noted.  No digital ulcers were noted.  She had nailbed capillary dropout.  I reviewed notes from Dr. Golden Pop visit.  She had high-resolution CT and was evaluated by Dr. Chase Caller.  Her PFTs were stable.  High-resolution CT did not show ILD.  I reviewed notes from Dr. Clayborne Dana visit.  She was evaluated by Dr. Haroldine Laws on November 16, 2021.  Echocardiogram did not show pulmonary hypertension.  She denies any shortness of breath or palpitations.  Patient states that she will be getting her labs in January with her PCP.  She will have the labs forwarded to [place].  High risk medication use -she is not on any immunosuppressive agents.  Dr. Katy Fitch advised patient to stop taking plaquenil-may 2019.   Raynaud's phenomenon without gangrene-she is not taking any medicines.  She has been using warm clothing and keeping core temperature warm which has been helpful.  Osteopenia of multiple sites - DEXA 12/07/2019:The BMD measured at  [place] Left is 0.827 g/cm2 with a T-score of -1.5.use of calcium rich diet and exercise was emphasized.  Patient does exercise on a regular basis.  History of uterine cancer  History of coronary artery disease-she has coronary artery disease and a stent.  She is followed by Dr. Haroldine Laws.  Dyslipidemia-she is on Repatha now.  H/O cardiac radiofrequency ablation - Followed by Dr. Haroldine Laws  History of hypothyroidism  Orders: No orders  of the defined types were placed in this encounter.  No orders of the defined types were placed in this encounter.    Follow-Up Instructions: Return in about 6 months (around 07/17/2022) for Scleroderma.   Bo Merino, MD  Note - This record has been created using Editor, commissioning.  Chart creation errors have been sought, but may not always  have been located. Such creation errors do not reflect on  the standard of medical care.

## 2022-01-15 DIAGNOSIS — H43811 Vitreous degeneration, right eye: Secondary | ICD-10-CM | POA: Diagnosis not present

## 2022-01-15 DIAGNOSIS — M654 Radial styloid tenosynovitis [de Quervain]: Secondary | ICD-10-CM | POA: Diagnosis not present

## 2022-01-16 ENCOUNTER — Ambulatory Visit: Payer: Medicare Other | Attending: Rheumatology | Admitting: Rheumatology

## 2022-01-16 ENCOUNTER — Encounter: Payer: Self-pay | Admitting: Rheumatology

## 2022-01-16 VITALS — BP 126/76 | HR 66 | Resp 15 | Ht 64.0 in | Wt 145.0 lb

## 2022-01-16 DIAGNOSIS — I7300 Raynaud's syndrome without gangrene: Secondary | ICD-10-CM | POA: Diagnosis not present

## 2022-01-16 DIAGNOSIS — M65332 Trigger finger, left middle finger: Secondary | ICD-10-CM

## 2022-01-16 DIAGNOSIS — Z8679 Personal history of other diseases of the circulatory system: Secondary | ICD-10-CM | POA: Diagnosis not present

## 2022-01-16 DIAGNOSIS — Z79899 Other long term (current) drug therapy: Secondary | ICD-10-CM

## 2022-01-16 DIAGNOSIS — Z9889 Other specified postprocedural states: Secondary | ICD-10-CM | POA: Diagnosis not present

## 2022-01-16 DIAGNOSIS — E785 Hyperlipidemia, unspecified: Secondary | ICD-10-CM | POA: Diagnosis not present

## 2022-01-16 DIAGNOSIS — M7052 Other bursitis of knee, left knee: Secondary | ICD-10-CM

## 2022-01-16 DIAGNOSIS — D696 Thrombocytopenia, unspecified: Secondary | ICD-10-CM

## 2022-01-16 DIAGNOSIS — Z8542 Personal history of malignant neoplasm of other parts of uterus: Secondary | ICD-10-CM

## 2022-01-16 DIAGNOSIS — Z8639 Personal history of other endocrine, nutritional and metabolic disease: Secondary | ICD-10-CM | POA: Diagnosis not present

## 2022-01-16 DIAGNOSIS — M8589 Other specified disorders of bone density and structure, multiple sites: Secondary | ICD-10-CM | POA: Diagnosis not present

## 2022-01-16 DIAGNOSIS — M349 Systemic sclerosis, unspecified: Secondary | ICD-10-CM | POA: Diagnosis not present

## 2022-01-16 NOTE — Patient Instructions (Signed)
Get CBC with differential, CMP with GFR in January and forward results to [place].

## 2022-01-17 ENCOUNTER — Encounter: Payer: Self-pay | Admitting: *Deleted

## 2022-02-20 ENCOUNTER — Encounter: Payer: Self-pay | Admitting: Family Medicine

## 2022-03-08 ENCOUNTER — Encounter (HOSPITAL_COMMUNITY): Payer: Self-pay | Admitting: Internal Medicine

## 2022-03-25 ENCOUNTER — Encounter: Payer: Self-pay | Admitting: Family Medicine

## 2022-03-25 ENCOUNTER — Encounter: Payer: Self-pay | Admitting: Rheumatology

## 2022-03-25 ENCOUNTER — Encounter: Payer: Self-pay | Admitting: Pharmacist

## 2022-03-25 DIAGNOSIS — M349 Systemic sclerosis, unspecified: Secondary | ICD-10-CM

## 2022-03-25 DIAGNOSIS — E785 Hyperlipidemia, unspecified: Secondary | ICD-10-CM

## 2022-03-25 DIAGNOSIS — Z79899 Other long term (current) drug therapy: Secondary | ICD-10-CM

## 2022-03-25 DIAGNOSIS — I2510 Atherosclerotic heart disease of native coronary artery without angina pectoris: Secondary | ICD-10-CM

## 2022-03-25 DIAGNOSIS — E039 Hypothyroidism, unspecified: Secondary | ICD-10-CM

## 2022-03-26 NOTE — Addendum Note (Signed)
Addended by: Carole Binning on: 03/26/2022 09:39 AM   Modules accepted: Orders

## 2022-03-26 NOTE — Telephone Encounter (Signed)
Informed patient about lab that she could go to via phone

## 2022-03-26 NOTE — Addendum Note (Signed)
Addended by: Carole Binning on: 03/26/2022 09:40 AM   Modules accepted: Orders

## 2022-03-27 ENCOUNTER — Other Ambulatory Visit: Payer: Self-pay

## 2022-03-27 DIAGNOSIS — I2510 Atherosclerotic heart disease of native coronary artery without angina pectoris: Secondary | ICD-10-CM | POA: Diagnosis not present

## 2022-03-27 DIAGNOSIS — E039 Hypothyroidism, unspecified: Secondary | ICD-10-CM

## 2022-03-27 DIAGNOSIS — E785 Hyperlipidemia, unspecified: Secondary | ICD-10-CM | POA: Diagnosis not present

## 2022-03-27 LAB — LIPID PANEL
Chol/HDL Ratio: 1.5 ratio (ref 0.0–4.4)
Cholesterol, Total: 113 mg/dL (ref 100–199)
HDL: 76 mg/dL (ref >39)
LDL Chol Calc (NIH): 24 mg/dL (ref 0–99)
Triglycerides: 54 mg/dL (ref 0–149)
VLDL Cholesterol Cal: 13 mg/dL (ref 5–40)

## 2022-03-28 ENCOUNTER — Encounter: Payer: Self-pay | Admitting: Family Medicine

## 2022-03-28 ENCOUNTER — Encounter: Payer: Self-pay | Admitting: Podiatry

## 2022-03-28 ENCOUNTER — Ambulatory Visit: Payer: Medicare Other | Admitting: Podiatry

## 2022-03-28 ENCOUNTER — Ambulatory Visit (INDEPENDENT_AMBULATORY_CARE_PROVIDER_SITE_OTHER): Payer: Medicare Other

## 2022-03-28 DIAGNOSIS — M722 Plantar fascial fibromatosis: Secondary | ICD-10-CM | POA: Diagnosis not present

## 2022-03-28 NOTE — Patient Instructions (Signed)

## 2022-03-31 ENCOUNTER — Encounter: Payer: Self-pay | Admitting: Pharmacist

## 2022-03-31 DIAGNOSIS — I2510 Atherosclerotic heart disease of native coronary artery without angina pectoris: Secondary | ICD-10-CM

## 2022-04-01 LAB — SPECIMEN STATUS REPORT

## 2022-04-01 LAB — TSH: TSH: 4.39 u[IU]/mL (ref 0.450–4.500)

## 2022-04-01 NOTE — Progress Notes (Signed)
  Subjective:  Patient ID: Rhonda Brewer, female    DOB: 06-06-1950,  MRN: [id number]  Chief Complaint  Patient presents with   Foot Pain    np right heel pain for several months not improving/ esp when sitting and then stand( req Dr Sherryle Lis)    72 y.o. female presents with the above complaint. History confirmed with patient.   Objective:  Physical Exam: warm, good capillary refill, no trophic changes or ulcerative lesions, normal DP and PT pulses, and normal sensory exam. Left Foot: normal exam, no swelling, tenderness, instability; ligaments intact, full range of motion of all ankle/foot joints Right Foot: point tenderness over the heel pad and gastrocnemius equinus is noted with a positive silverskiold test  No images are attached to the encounter.  Radiographs: Multiple views x-ray of the right foot: no fracture, dislocation, swelling or degenerative changes noted, plantar calcaneal spur, and posterior calcaneal spur Assessment:   1. Plantar fasciitis of right foot      Plan:  Patient was evaluated and treated and all questions answered.  Discussed the etiology and treatment options for plantar fasciitis including stretching, formal physical therapy, supportive shoegears such as a running shoe or sneaker, pre fabricated orthoses, injection therapy, and oral medications. We also discussed the role of surgical treatment of this for patients who do not improve after exhausting non-surgical treatment options.   -XR reviewed with patient -Educated patient on stretching and icing of the affected limb -Plantar fascial brace dispensed -Injection delivered to the plantar fascia of the right foot.  After sterile prep with povidone-iodine solution and alcohol, the right heel was injected with 0.5cc 2% xylocaine plain, 0.5cc 0.5% marcaine plain, '5mg'$  triamcinolone acetonide, and '2mg'$  dexamethasone was injected along the medial plantar fascia at the insertion on the plantar calcaneus. The  patient tolerated the procedure well without complication.  Return if symptoms worsen or fail to improve.

## 2022-04-02 ENCOUNTER — Encounter: Payer: Self-pay | Admitting: Family Medicine

## 2022-04-02 MED ORDER — ATORVASTATIN CALCIUM 40 MG PO TABS: 40.0000 mg | ORAL_TABLET | Freq: Every day | ORAL | 3 refills | Status: DC

## 2022-04-02 NOTE — Addendum Note (Signed)
Addended by: Rollen Sox on: 04/02/2022 10:07 AM   Modules accepted: Orders

## 2022-04-23 ENCOUNTER — Encounter: Payer: Self-pay | Admitting: Family Medicine

## 2022-05-06 ENCOUNTER — Other Ambulatory Visit (HOSPITAL_COMMUNITY): Payer: Self-pay

## 2022-05-06 ENCOUNTER — Telehealth: Payer: Self-pay

## 2022-05-06 NOTE — Telephone Encounter (Signed)
Pharmacy Patient Advocate Encounter   Received notification from [facility] that prior authorization for Repatha 167m/ml is required/requested.     PA submitted on 2.19.24 to (ins) [employer] via CoverMyMeds Key [id number] is pending

## 2022-05-06 NOTE — Telephone Encounter (Signed)
Pharmacy Patient Advocate Encounter  Prior Authorization for Repatha 140MG/ML has been Approved.    PA# [id number] Effective dates: 1.1.24 through 12.31.24

## 2022-05-10 ENCOUNTER — Other Ambulatory Visit: Payer: Self-pay | Admitting: Family Medicine

## 2022-05-10 ENCOUNTER — Encounter: Payer: Self-pay | Admitting: Family Medicine

## 2022-05-10 ENCOUNTER — Ambulatory Visit (INDEPENDENT_AMBULATORY_CARE_PROVIDER_SITE_OTHER): Payer: Medicare Other

## 2022-05-10 DIAGNOSIS — Z0000 Encounter for general adult medical examination without abnormal findings: Secondary | ICD-10-CM | POA: Diagnosis not present

## 2022-05-10 MED ORDER — PANTOPRAZOLE SODIUM 40 MG PO TBEC: 40.0000 mg | DELAYED_RELEASE_TABLET | Freq: Two times a day (BID) | ORAL | 0 refills | Status: DC

## 2022-05-10 NOTE — Progress Notes (Signed)
I connected with  Rhonda Brewer on 05/10/22 by a audio enabled telemedicine application and verified that I am speaking with the correct person using two identifiers.  Patient Location: Home  Provider Location: Home Office  I discussed the limitations of evaluation and management by telemedicine. The patient expressed understanding and agreed to proceed.;e Subjective:   Rhonda Brewer is a 72 y.o. female who presents for Medicare Annual (Subsequent) preventive examination.  Review of Systems    Per HPI unless specifically indicated below.  Cardiac Risk Factors include: advanced age (>72mn, >>39women);female gender, Heart disease, Hypertension, and Hyperlipidemia.          Objective:       01/16/2022    1:04 PM 12/31/2021    8:44 AM 11/20/2021    1:00 PM  Vitals with BMI  Height '5\' 4"'$  '5\' 4"'$    Weight 145 lbs 145 lbs 10 oz   BMI [id number]  Systolic [id number]  Diastolic 76 59   Pulse 66 63 60    There were no vitals filed for this visit. There is no height or weight on file to calculate BMI.     05/10/2022    1:48 PM 12/31/2021    8:46 AM 11/20/2021    6:35 AM 08/07/2021    3:32 PM 05/28/2021    3:27 PM 11/12/2018    8:30 AM 03/09/2018   10:21 AM  Advanced Directives  Does Patient Have a Medical Advance Directive? Yes No Yes No Yes No Yes  Type of AParamedicof [place]Living will  [place] will  [place] will  [place] will  Does patient want to make changes to medical advance directive? No - Patient declined        Copy of [place] Chart? Yes - validated most recent copy scanned in chart (See row information)    No - copy requested  No - copy requested  Would patient like information on creating a medical advance directive?    No - Patient declined  No - Patient declined     Current Medications (verified) Outpatient Encounter Medications as  of 05/10/2022  Medication Sig   acetaminophen (TYLENOL) 500 MG tablet Take 500-1,000 mg by mouth every 6 (six) hours as needed (pain.).   albuterol (VENTOLIN HFA) 108 (90 Base) MCG/ACT inhaler Inhale 2 puffs into the lungs every 4 (four) hours as needed for wheezing or shortness of breath.   aspirin EC 81 MG tablet Take 81 mg by mouth at bedtime.   atorvastatin (LIPITOR) 40 MG tablet Take 1 tablet (40 mg total) by mouth daily.   Calcium Carbonate-Vitamin D (CALCIUM-VITAMIN D3 PO) Take 1 tablet by mouth in the morning and at bedtime.   Cyanocobalamin (VITAMIN B-12 PO) Take 1 tablet by mouth in the morning.   cycloSPORINE (RESTASIS) 0.05 % ophthalmic emulsion Place 1 drop into both eyes 2 (two) times daily.   Evolocumab (REPATHA SURECLICK) 1XX123456MG/ML SOAJ Inject 1 mL into the skin every 14 (fourteen) days.   levothyroxine (SYNTHROID) 75 MCG tablet TAKE 1 TABLET BY MOUTH  DAILY BEFORE BREAKFAST   Multiple Vitamin (MULTI-VITAMINS) TABS Take 1 tablet by mouth in the morning.   pantoprazole (PROTONIX) 40 MG tablet Take 1 tablet (40 mg total) by mouth 2 (two) times daily before a meal. Appointment needed for refills   valACYclovir (VALTREX) 500 MG tablet Take 500 mg by mouth See admin  instructions. Take 1 tablet (500 mg) by mouth twice daily x 5 day at onset of outbreak.   [DISCONTINUED] pantoprazole (PROTONIX) 40 MG tablet Take 1 tablet (40 mg total) by mouth 2 (two) times daily before a meal. Appointment needed for refills   No facility-administered encounter medications on file as of 05/10/2022.    Allergies (verified) Avelox [moxifloxacin hcl in nacl] and Other   History: Past Medical History:  Diagnosis Date   Allergy    Arthritis    CAD (coronary artery disease)    a. DES to RCA 11/2015   Cancer [facility])    Uterine 2008   CHF (congestive heart failure) (HCC)    Heart murmur    Hyperlipidemia    Osteoporosis    Thyroid disease    Past Surgical History:  Procedure Laterality Date    ABDOMINAL HYSTERECTOMY     APPENDECTOMY     CARDIAC ELECTROPHYSIOLOGY MAPPING AND ABLATION  04/19/2003   CARPAL TUNNEL RELEASE Right 10/15/2017   CATARACT EXTRACTION Bilateral    07/2021 R 08/2021 L   CORONARY ANGIOPLASTY WITH STENT PLACEMENT     RIGHT/LEFT HEART CATH AND CORONARY ANGIOGRAPHY [name] 08/14/2017   Procedure: RIGHT/LEFT HEART CATH AND CORONARY ANGIOGRAPHY;  Surgeon: Jolaine Artist, MD;  Location: [place] [facility];  Service: Cardiovascular;  Laterality: [name];   RIGHT/LEFT HEART CATH AND CORONARY ANGIOGRAPHY [name] 11/20/2021   Procedure: RIGHT/LEFT HEART CATH AND CORONARY ANGIOGRAPHY;  Surgeon: Jolaine Artist, MD;  Location: [place] [facility];  Service: Cardiovascular;  Laterality: [name];   ULTRASOUND GUIDANCE FOR VASCULAR ACCESS  08/14/2017   Procedure: Ultrasound Guidance For Vascular Access;  Surgeon: Jolaine Artist, MD;  Location: [place] [facility];  Service: Cardiovascular;;   Family History  Problem Relation Age of Onset   Asthma Maternal Aunt    Heart disease Mother    Diabetes Mother    Heart disease Father    Stomach cancer Brother    Diabetes Brother    Colon cancer Neg Hx    Esophageal cancer Neg Hx    Rectal cancer Neg Hx    Social History   Socioeconomic History   Marital status: Married    Spouse name: Cay Schillings   Number of children: 2   Years of education: 12   Highest education level: 12th grade  Occupational History   Occupation: retired    Comment: Scientist, water quality  Tobacco Use   Smoking status: Never    Passive exposure: Never   Smokeless tobacco: Never  Vaping Use   Vaping Use: Never used  Substance and Sexual Activity   Alcohol use: Yes    Comment: occasional wine   Drug use: No   Sexual activity: Yes    Birth control/protection: Surgical    Comment: hysterectomy  Other Topics Concern   Not on file  Social History Narrative   Lives in home with husband. Daughters live out of state. Step-daughter in [place]. Has 8  grandchildren. Lives in 2 level home but most living is on first floor. Stairs have handrails, no problem with stairs. No grab bars in bathroom. No tripping hazards. Smoke alarms present.   No pets.    Eats a good variety of foods, husband is a cook. Eats meats, fruits, vegetables. Drinks water mostly. Occasional tea.   Goes to gym 5 times a week. Enjoys reading, sewing, movies. Walking when weather is nice.    Wears seat belt in car.    Social Determinants of Health  Financial Resource Strain: Low Risk  (05/10/2022)   Overall Financial Resource Strain (CARDIA)    Difficulty of Paying Living Expenses: Not hard at all  Food Insecurity: No Food Insecurity (05/10/2022)   Hunger Vital Sign    Worried About Running Out of Food in the Last Year: Never true    Ran Out of Food in the Last Year: Never true  Transportation Needs: No Transportation Needs (05/10/2022)   PRAPARE - Hydrologist (Medical): No    Lack of Transportation (Non-Medical): No  Physical Activity: Sufficiently Active (05/10/2022)   Exercise Vital Sign    Days of Exercise per Week: 6 days    Minutes of Exercise per Session: 90 min  Stress: No Stress Concern Present (05/10/2022)   [place]    Feeling of Stress : Not at all  Social Connections: Moderately Integrated (05/10/2022)   Social Connection and Isolation Panel [NHANES]    Frequency of Communication with Friends and Family: More than three times a week    Frequency of Social Gatherings with Friends and Family: More than three times a week    Attends Religious Services: Never    Marine scientist or Organizations: No    Attends Music therapist: More than 4 times per year    Marital Status: Married    Tobacco Counseling Counseling given: Not Answered   Clinical Intake:  Pre-visit preparation completed: No  Pain : No/denies pain     Nutritional  Status: BMI of 19-24  Normal Nutritional Risks: None Diabetes: No  How often do you need to have someone help you when you read instructions, pamphlets, or other written materials from your doctor or pharmacy?: 1 - Never  Diabetic?No  Interpreter Needed?: No  Information entered by :: Donnie Mesa, CMA   Activities of Daily Living    05/10/2022    1:21 PM  In your present state of health, do you have any difficulty performing the following activities:  Hearing? 1  Vision? 0  Difficulty concentrating or making decisions? 0  Walking or climbing stairs? 0  Dressing or bathing? 0  Doing errands, shopping? 0    Patient Care Team: Kinnie Feil, MD as PCP - General (Family Medicine) Bensimhon, Shaune Pascal, MD as PCP - Advanced Heart Failure (Cardiology) Bo Merino, MD as Consulting Physician (Rheumatology) Bensimhon, Shaune Pascal, MD as Consulting Physician (Cardiology) Mauri Pole, MD as Consulting Physician (Gastroenterology) Brand Males, MD as Consulting Physician (Pulmonary Disease) Warden Fillers, MD as Consulting Physician (Ophthalmology) Royston Sinner Colin Benton, MD as Consulting Physician (Obstetrics and Gynecology)  Indicate any recent Medical Services you may have received from other than [facility] providers in the past year (date may be approximate).     Assessment:   This is a routine wellness examination for [facility].  Hearing/Vision screen Decrease hearing. Was seen by an Audiologist and told she will need hearing aids in the near future. Denies any vision changes. Annual Eye Exam, Theodora Blow, MD  Dietary issues and exercise activities discussed: Current Exercise Habits: Structured exercise class, Type of exercise: strength training/weights;exercise ball;walking, Time (Minutes): 60, Frequency (Times/Week): 6, Weekly Exercise (Minutes/Week): 360, Intensity: Moderate, Exercise limited by: None identified   Goals Addressed   None     Depression Screen    05/10/2022    1:20 PM 12/31/2021    8:47 AM 08/07/2021    3:31 PM 12/07/2020    8:50 AM 11/29/2019  10:21 AM 11/12/2018    8:30 AM 03/09/2018   10:23 AM  PHQ 2/9 Scores  PHQ - 2 Score 0 0 0 1 0 0 0  PHQ- 9 Score  0 4 1       Fall Risk    05/10/2022    1:21 PM 08/07/2021    3:32 PM 11/12/2018    8:30 AM 03/09/2018   10:23 AM 03/20/2017    9:05 AM  Fall Risk   Falls in the past year? 0 0 0 0 No  Number falls in past yr: 0 0     Injury with Fall? 0 0     Risk for fall due to : No Fall Risks      Follow up Falls evaluation completed        FALL RISK PREVENTION PERTAINING TO THE HOME:  Any stairs in or around the home? Yes  If so, are there any without handrails? No  Home free of loose throw rugs in walkways, pet beds, electrical cords, etc? Yes  Adequate lighting in your home to reduce risk of falls? Yes   ASSISTIVE DEVICES UTILIZED TO PREVENT FALLS:  Life alert? No  Use of a cane, walker or w/c? No  Grab bars in the bathroom? No  Shower chair or bench in shower? No  Elevated toilet seat or a handicapped toilet? Yes   TIMED UP AND GO:  Was the test performed?Unable to perform, virtual appointment   Cognitive Function:    03/09/2018   10:24 AM  MMSE - Mini Mental State Exam  Orientation to time 5  Orientation to Place 5  Registration 3  Attention/ Calculation 5  Recall 3  Language- name 2 objects 2  Language- repeat 1  Language- follow 3 step command 3  Language- read & follow direction 1  Write a sentence 1  Copy design 1  Total score 30        05/10/2022    1:22 PM 03/09/2018   10:25 AM  6CIT Screen  What Year? 0 points 0 points  What month? 0 points 0 points  What time? 0 points 0 points  Count back from 20 0 points 0 points  Months in reverse 0 points 0 points  Repeat phrase 0 points 0 points  Total Score 0 points 0 points    Immunizations Immunization History  Administered Date(s) Administered   Fluad Quad(high Dose  65+) 11/29/2019, 12/07/2020, 12/31/2021   Influenza Split 01/17/2016   Influenza,inj,Quad PF,6+ Mos 11/12/2018   Influenza-Unspecified 04/02/2017, 12/08/2017   Moderna Covid-19 Vaccine Bivalent Booster 10yr & up 01/31/2021   Moderna SARS-COV2 Booster Vaccination 01/13/2020, 09/25/2020   Moderna Sars-Covid-2 Vaccination 04/09/2019, 05/14/2019   Pneumococcal Conjugate-13 10/26/2014   Pneumococcal Polysaccharide-23 11/12/2018   Pneumococcal-Unspecified 09/14/2013   Td 07/18/2011   Unspecified SARS-COV-2 Vaccination 02/19/2022    TDAP status: Due, Education has been provided regarding the importance of this vaccine. Advised may receive this vaccine at local pharmacy or Health Dept. Aware to provide a copy of the vaccination record if obtained from local pharmacy or Health Dept. Verbalized acceptance and understanding.  Flu Vaccine status: Up to date  Pneumococcal vaccine status: Up to date  Covid-19 vaccine status: Information provided on how to obtain vaccines.   Qualifies for Shingles Vaccine? Yes   Zostavax completed No   Shingrix Completed?: No.    Education has been provided regarding the importance of this vaccine. Patient has been advised to call insurance company to determine out  of pocket expense if they have not yet received this vaccine. Advised may also receive vaccine at local pharmacy or Health Dept. Verbalized acceptance and understanding.  Screening Tests Health Maintenance  Topic Date Due   Zoster Vaccines- Shingrix (1 of 2) Never done   DTaP/Tdap/Td (2 - Tdap) 07/17/2021   COVID-19 Vaccine (5 - 2023-24 season) 04/16/2022   COLONOSCOPY (Pts 45-54yr Insurance coverage will need to be confirmed)  02/06/2023   Medicare Annual Wellness (AWV)  05/11/2023   MAMMOGRAM  05/25/2023   Pneumonia Vaccine 72 Years old  Completed   INFLUENZA VACCINE  Completed   DEXA SCAN  Completed   Hepatitis C Screening  Completed   HPV VACCINES  Aged Out    Health Maintenance  Health  Maintenance Due  Topic Date Due   Zoster Vaccines- Shingrix (1 of 2) Never done   DTaP/Tdap/Td (2 - Tdap) 07/17/2021   COVID-19 Vaccine (5 - 2023-24 season) 04/16/2022    Colorectal cancer screening: Type of screening: Colonoscopy. Completed 02/06/2023. Repeat every 10 years  Mammogram status: Completed 05/25/2022. Repeat every year  Dexa Scan: 12/07/2019  Lung Cancer Screening: (Low Dose CT Chest recommended if Age 72-80years, 30 pack-year currently smoking OR have quit w/in 15years.) does not qualify.   Lung Cancer Screening Referral: not applicable   Additional Screening:  Hepatitis C Screening: does qualify; Completed 09/04/2016  Vision Screening: Recommended annual ophthalmology exams for early detection of glaucoma and other disorders of the eye. Is the patient up to date with their annual eye exam?  Yes  Who is the provider or what is the name of the office in which the patient attends annual eye exams? [facility] If pt is not established with a provider, would they like to be referred to a provider to establish care? No .   Dental Screening: Recommended annual dental exams for proper oral hygiene  Community Resource Referral / Chronic Care Management: CRR required this visit?  No   CCM required this visit?  No      Plan:     I have personally reviewed and noted the following in the patient's chart:   Medical and social history Use of alcohol, tobacco or illicit drugs  Current medications and supplements including opioid prescriptions. Patient is not currently taking opioid prescriptions. Functional ability and status Nutritional status Physical activity Advanced directives List of other physicians Hospitalizations, surgeries, and ER visits in previous 12 months Vitals Screenings to include cognitive, depression, and falls Referrals and appointments  In addition, I have reviewed and discussed with patient certain preventive protocols,  quality metrics, and best practice recommendations. A written personalized care plan for preventive services as well as general preventive health recommendations were provided to patient.    Ms. FPongracz, Thank you for taking time to come for your Medicare Wellness Visit. I appreciate your ongoing commitment to your health goals. Please review the following plan we discussed and let me know if I can assist you in the future.   These are the goals we discussed:  Goals      Patient Stated     Work with a pPhysiological scientistat [place]for improved health.        This is a list of the screening recommended for you and due dates:  Health Maintenance  Topic Date Due   Zoster (Shingles) Vaccine (1 of 2) Never done   DTaP/Tdap/Td vaccine (2 - Tdap) 07/17/2021   COVID-19 Vaccine (5 -  2023-24 season) 04/16/2022   Colon Cancer Screening  02/06/2023   Medicare Annual Wellness Visit  05/11/2023   Mammogram  05/25/2023   Pneumonia Vaccine  Completed   Flu Shot  Completed   DEXA scan (bone density measurement)  Completed   Hepatitis C Screening: USPSTF Recommendation to screen - Ages 31-79 yo.  Completed   HPV Vaccine  Aged [address], [place]   05/10/2022   Nurse Notes: Approximately 30 minute Non-Face -To-Face Medicare Wellness Visit

## 2022-05-10 NOTE — Patient Instructions (Signed)

## 2022-05-14 NOTE — Progress Notes (Addendum)
Reviewed and agree with documentation and assessment and plan. K. Veena Emeril Stille , MD   

## 2022-05-15 ENCOUNTER — Ambulatory Visit: Payer: Medicare Other | Admitting: Gastroenterology

## 2022-05-15 ENCOUNTER — Other Ambulatory Visit: Payer: Self-pay | Admitting: Gastroenterology

## 2022-05-15 ENCOUNTER — Encounter: Payer: Self-pay | Admitting: Gastroenterology

## 2022-05-15 VITALS — BP 120/70 | HR 62 | Ht 64.0 in | Wt 145.0 lb

## 2022-05-15 DIAGNOSIS — R12 Heartburn: Secondary | ICD-10-CM | POA: Diagnosis not present

## 2022-05-15 DIAGNOSIS — K219 Gastro-esophageal reflux disease without esophagitis: Secondary | ICD-10-CM | POA: Diagnosis not present

## 2022-05-15 DIAGNOSIS — R1013 Epigastric pain: Secondary | ICD-10-CM

## 2022-05-15 DIAGNOSIS — K5904 Chronic idiopathic constipation: Secondary | ICD-10-CM | POA: Diagnosis not present

## 2022-05-15 MED ORDER — PANTOPRAZOLE SODIUM 40 MG PO TBEC: 40.0000 mg | DELAYED_RELEASE_TABLET | Freq: Every day | ORAL | 3 refills | Status: DC

## 2022-05-15 MED ORDER — FAMOTIDINE 20 MG PO TABS: 20.0000 mg | ORAL_TABLET | Freq: Every day | ORAL | 1 refills | Status: DC

## 2022-05-15 NOTE — Patient Instructions (Addendum)
You have been scheduled for an endoscopy. Please follow written instructions given to you at your visit today. If you use inhalers (even only as needed), please bring them with you on the day of your procedure.   We have sent the following medications to your pharmacy for you to pick up at your convenience:  Pantoprazole   Pepcid   Gastroesophageal Reflux Disease, Adult Gastroesophageal reflux (GER) happens when acid from the stomach flows up into the tube that connects the mouth and the stomach (esophagus). Normally, food travels down the esophagus and stays in the stomach to be digested. However, when a person has GER, food and stomach acid sometimes move back up into the esophagus. If this becomes a more serious problem, the person may be diagnosed with a disease called gastroesophageal reflux disease (GERD). GERD occurs when the reflux: Happens often. Causes frequent or severe symptoms. Causes problems such as damage to the esophagus. When stomach acid comes in contact with the esophagus, the acid may cause inflammation in the esophagus. Over time, GERD may create small holes (ulcers) in the lining of the esophagus. What are the causes? This condition is caused by a problem with the muscle between the esophagus and the stomach (lower esophageal sphincter, or LES). Normally, the LES muscle closes after food passes through the esophagus to the stomach. When the LES is weakened or abnormal, it does not close properly, and that allows food and stomach acid to go back up into the esophagus. The LES can be weakened by certain dietary substances, medicines, and medical conditions, including: Tobacco use. Pregnancy. Having a hiatal hernia. Alcohol use. Certain foods and beverages, such as coffee, chocolate, onions, and peppermint. What increases the risk? You are more likely to develop this condition if you: Have an increased body weight. Have a connective tissue disorder. Take NSAIDs, such as  ibuprofen. What are the signs or symptoms? Symptoms of this condition include: Heartburn. Difficult or painful swallowing and the feeling of having a lump in the throat. A bitter taste in the mouth. Bad breath and having a large amount of saliva. Having an upset or bloated stomach and belching. Chest pain. Different conditions can cause chest pain. Make sure you see your health care provider if you experience chest pain. Shortness of breath or wheezing. Ongoing (chronic) cough or a nighttime cough. Wearing away of tooth enamel. Weight loss. How is this diagnosed? This condition may be diagnosed based on a medical history and a physical exam. To determine if you have mild or severe GERD, your health care provider may also monitor how you respond to treatment. You may also have tests, including: A test to examine your stomach and esophagus with a small camera (endoscopy). A test that measures the acidity level in your esophagus. A test that measures how much pressure is on your esophagus. A barium swallow or modified barium swallow test to show the shape, size, and functioning of your esophagus. How is this treated? Treatment for this condition may vary depending on how severe your symptoms are. Your health care provider may recommend: Changes to your diet. Medicine. Surgery. The goal of treatment is to help relieve your symptoms and to prevent complications. Follow these instructions at home: Eating and drinking  Follow a diet as recommended by your health care provider. This may involve avoiding foods and drinks such as: Coffee and tea, with or without caffeine. Drinks that contain alcohol. Energy drinks and sports drinks. Carbonated drinks or sodas. Chocolate and cocoa.  Peppermint and mint flavorings. Garlic and onions. Horseradish. Spicy and acidic foods, including peppers, chili powder, curry powder, vinegar, hot sauces, and barbecue sauce. Citrus fruit juices and citrus  fruits, such as oranges, lemons, and limes. Tomato-based foods, such as red sauce, chili, salsa, and pizza with red sauce. Fried and fatty foods, such as donuts, french fries, potato chips, and high-fat dressings. High-fat meats, such as hot dogs and fatty cuts of red and white meats, such as rib eye steak, sausage, ham, and bacon. High-fat dairy items, such as whole milk, butter, and cream cheese. Eat small, frequent meals instead of large meals. Avoid drinking large amounts of liquid with your meals. Avoid eating meals during the 2-3 hours before bedtime. Avoid lying down right after you eat. Do not exercise right after you eat. Lifestyle  Do not use any products that contain nicotine or tobacco. These products include cigarettes, chewing tobacco, and vaping devices, such as e-cigarettes. If you need help quitting, ask your health care provider. Try to reduce your stress by using methods such as yoga or meditation. If you need help reducing stress, ask your health care provider. If you are overweight, reduce your weight to an amount that is healthy for you. Ask your health care provider for guidance about a safe weight loss goal. General instructions Pay attention to any changes in your symptoms. Take over-the-counter and prescription medicines only as told by your health care provider. Do not take aspirin, ibuprofen, or other NSAIDs unless your health care provider told you to take these medicines. Wear loose-fitting clothing. Do not wear anything tight around your waist that causes pressure on your abdomen. Raise (elevate) the head of your bed about 6 inches (15 cm). You can use a wedge to do this. Avoid bending over if this makes your symptoms worse. Keep all follow-up visits. This is important. Contact a health care provider if: You have: New symptoms. Unexplained weight loss. Difficulty swallowing or it hurts to swallow. Wheezing or a persistent cough. A hoarse voice. Your  symptoms do not improve with treatment. Get help right away if: You have sudden pain in your arms, neck, jaw, teeth, or back. You suddenly feel sweaty, dizzy, or light-headed. You have chest pain or shortness of breath. You vomit and the vomit is green, yellow, or black, or it looks like blood or coffee grounds. You faint. You have stool that is red, bloody, or black. You cannot swallow, drink, or eat. These symptoms may represent a serious problem that is an emergency. Do not wait to see if the symptoms will go away. Get medical help right away. Call your local emergency services (911 in the [place] Do not drive yourself to the hospital. Summary Gastroesophageal reflux happens when acid from the stomach flows up into the esophagus. GERD is a disease in which the reflux happens often, causes frequent or severe symptoms, or causes problems such as damage to the esophagus. Treatment for this condition may vary depending on how severe your symptoms are. Your health care provider may recommend diet and lifestyle changes, medicine, or surgery. Contact a health care provider if you have new or worsening symptoms. Take over-the-counter and prescription medicines only as told by your health care provider. Do not take aspirin, ibuprofen, or other NSAIDs unless your health care provider told you to do so. Keep all follow-up visits as told by your health care provider. This is important. This information is not intended to replace advice given to you by your health  care provider. Make sure you discuss any questions you have with your health care provider. Document Revised: 09/13/2019 Document Reviewed: 09/13/2019 Elsevier Patient Education  [place].  I appreciate the  opportunity to care for you  Thank You   Harl Bowie , MD

## 2022-05-27 LAB — HM MAMMOGRAPHY

## 2022-06-02 ENCOUNTER — Encounter: Payer: Self-pay | Admitting: Family Medicine

## 2022-06-03 ENCOUNTER — Encounter: Payer: Self-pay | Admitting: Family Medicine

## 2022-06-03 NOTE — Progress Notes (Signed)
See media for mammogram report from 05/2022

## 2022-06-06 ENCOUNTER — Encounter: Payer: Self-pay | Admitting: Family Medicine

## 2022-06-13 ENCOUNTER — Other Ambulatory Visit: Payer: Self-pay | Admitting: Orthopedic Surgery

## 2022-06-14 HISTORY — PX: CARPAL TUNNEL RELEASE: SHX101

## 2022-06-14 HISTORY — PX: DE QUERVAIN'S RELEASE: SHX1439

## 2022-06-24 ENCOUNTER — Encounter: Payer: Self-pay | Admitting: Gastroenterology

## 2022-06-24 ENCOUNTER — Ambulatory Visit (AMBULATORY_SURGERY_CENTER): Payer: Medicare Other

## 2022-06-24 VITALS — Ht 64.0 in | Wt 140.0 lb

## 2022-06-24 DIAGNOSIS — R1013 Epigastric pain: Secondary | ICD-10-CM

## 2022-06-24 DIAGNOSIS — K219 Gastro-esophageal reflux disease without esophagitis: Secondary | ICD-10-CM

## 2022-06-24 DIAGNOSIS — R12 Heartburn: Secondary | ICD-10-CM

## 2022-06-24 NOTE — Progress Notes (Signed)
No egg or soy allergy known to patient  No issues known to pt with past sedation with any surgeries or procedures Patient denies ever being told they had issues or difficulty with intubation  No FH of Malignant Hyperthermia Pt is not on diet pills Pt is not on  home 02  Pt is not on blood thinners  Pt denies issues with constipation  No A fib or A flutter Have any cardiac testing pending--no Pt instructed to use [email or web address] or GoodRx for a price reduction on prep   

## 2022-06-25 ENCOUNTER — Encounter: Payer: Self-pay | Admitting: Gastroenterology

## 2022-06-25 ENCOUNTER — Encounter: Payer: Medicare Other | Admitting: Gastroenterology

## 2022-07-05 ENCOUNTER — Other Ambulatory Visit: Payer: Self-pay | Admitting: Family Medicine

## 2022-07-09 NOTE — Progress Notes (Signed)
Office Visit Note  Patient: Rhonda Brewer             Date of Birth: 1950/11/03           MRN: [id number]             PCP: Doreene Eland, MD Referring: [name], MD Visit Date: 07/23/2022 Occupation: @GUAROCC @  Subjective:  raynauds phenomenon  History of Present Illness: Deovion Tross is a 72 y.o. female with limited systemic sclerosis.  Patient states she continues to have mild Raynauds symptoms which are manageable.  She denies any history of digital ulcers.  She states about 6 weeks ago she had surgery on her left hand for de Quervain's tenosynovitis and carpal tunnel release by Dr. Melvyn Novas.  She is recovering gradually from it.  She continues to have some reflux symptoms. She has been seeing Dr. Lavon Paganini for gastroesophageal reflux.  Last appointment was on May 15, 2022.  Hypertonic stools were decreased to 40 mg in the morning and she was started on Pepcid 20 mg in the evening.  Dr. Lavon Paganini also discussed antireflux measures.  She also advised Colace for constipation.  She is practicing all reflux measures. Patient was evaluated by Dr. Gala Romney in September 2023.  At the time echocardiogram was negative for pulmonary hypertension.  She has been on Repatha for hyperlipidemia.  She was seen by Dr. Colletta Maryland in June 2023.  High-resolution CT scan of the chest June 2023 was negative for ILD.  Activities of Daily Living:  Patient reports morning stiffness for 20 minutes.   Patient Denies nocturnal pain.  Difficulty dressing/grooming: Denies Difficulty climbing stairs: Denies Difficulty getting out of chair: Denies Difficulty using hands for taps, buttons, cutlery, and/or writing: Denies  Review of Systems  Constitutional:  Negative for fatigue.  HENT:  Positive for mouth dryness. Negative for mouth sores.   Eyes:  Positive for dryness.  Respiratory:  Negative for shortness of breath.   Cardiovascular:  Negative for chest pain and palpitations.  Gastrointestinal:   Negative for blood in stool, constipation and diarrhea.  Endocrine: Negative for increased urination.  Genitourinary:  Negative for involuntary urination.  Musculoskeletal:  Positive for joint pain, joint pain and morning stiffness. Negative for gait problem, joint swelling, myalgias, muscle weakness, muscle tenderness and myalgias.  Skin:  Negative for color change, rash, hair loss and sensitivity to sunlight.  Allergic/Immunologic: Negative for susceptible to infections.  Neurological:  Negative for dizziness and headaches.  Hematological:  Negative for swollen glands.  Psychiatric/Behavioral:  Negative for depressed mood and sleep disturbance. The patient is not nervous/anxious.     PMFS History:  Patient Active Problem List   Diagnosis Date Noted   Abnormal liver CT 09/04/2021   Family history of diabetes mellitus (DM) 12/07/2020   De Quervain's tenosynovitis, left 12/07/2020   Genital herpes simplex 09/13/2020   Preventative health care 12/01/2019   Chronic interstitial cystitis 04/09/2019   Heart disease 01/08/2019   Hypertensive disorder 01/08/2019   Hypothyroidism 01/08/2019   Hyperlipidemia 01/08/2019   Acquired trigger finger of right ring finger 04/27/2018   Carpal tunnel syndrome 05/29/2017   Low back pain without sciatica 04/29/2017   Screening for breast cancer 09/04/2016   H/O cardiac radiofrequency ablation 06/26/2016   CAD (coronary artery disease), native coronary artery 06/19/2016   History of uterine cancer 06/19/2016   Scleroderma (HCC) 06/16/2016   Osteopenia of multiple sites 06/16/2016   Worsened handwriting 12/05/2014   Writer's cramp 12/05/2014   Raynaud's phenomenon  09/22/2012   Arthritis 09/22/2012   CREST syndrome (HCC) 04/08/2011    Past Medical History:  Diagnosis Date   Allergy    Arthritis    CAD (coronary artery disease)    a. DES to RCA 11/2015   Cancer [facility])    Uterine 2008   Cataract    CHF (congestive heart failure) (HCC)    Heart  murmur    Hyperlipidemia    Osteoporosis    Thyroid disease     Family History  Problem Relation Age of Onset   Asthma Maternal Aunt    Heart disease Mother    Diabetes Mother    Heart disease Father    Stomach cancer Brother    Diabetes Brother    Colon cancer Neg Hx    Esophageal cancer Neg Hx    Rectal cancer Neg Hx    Past Surgical History:  Procedure Laterality Date   ABDOMINAL HYSTERECTOMY     APPENDECTOMY     CARDIAC ELECTROPHYSIOLOGY MAPPING AND ABLATION  04/19/2003   CARPAL TUNNEL RELEASE Right 10/15/2017   CARPAL TUNNEL RELEASE Left 06/14/2022   CATARACT EXTRACTION Bilateral    07/2021 R 08/2021 L   CORONARY ANGIOPLASTY WITH STENT PLACEMENT     DE QUERVAIN'S RELEASE Left 06/14/2022   RIGHT/LEFT HEART CATH AND CORONARY ANGIOGRAPHY [name] 08/14/2017   Procedure: RIGHT/LEFT HEART CATH AND CORONARY ANGIOGRAPHY;  Surgeon: Dolores Patty, MD;  Location: [facility] INVASIVE CV LAB;  Service: Cardiovascular;  Laterality: [name];   RIGHT/LEFT HEART CATH AND CORONARY ANGIOGRAPHY [name] 11/20/2021   Procedure: RIGHT/LEFT HEART CATH AND CORONARY ANGIOGRAPHY;  Surgeon: Dolores Patty, MD;  Location: [facility] INVASIVE CV LAB;  Service: Cardiovascular;  Laterality: [name];   ULTRASOUND GUIDANCE FOR VASCULAR ACCESS  08/14/2017   Procedure: Ultrasound Guidance For Vascular Access;  Surgeon: Dolores Patty, MD;  Location: [facility] INVASIVE CV LAB;  Service: Cardiovascular;;   UPPER GI ENDOSCOPY  07/15/2022   Social History   Social History Narrative   Lives in home with husband. Daughters live out of state. Step-daughter in [place]. Has 8 grandchildren. Lives in 2 level home but most living is on first floor. Stairs have handrails, no problem with stairs. No grab bars in bathroom. No tripping hazards. Smoke alarms present.   No pets.    Eats a good variety of foods, husband is a cook. Eats meats, fruits, vegetables. Drinks water mostly. Occasional tea.   Goes to gym 5 times a week. Enjoys reading, sewing,  movies. Walking when weather is nice.    Wears seat belt in car.    Immunization History  Administered Date(s) Administered   Fluad Quad(high Dose 65+) 11/29/2019, 12/07/2020, 12/31/2021   Influenza Split 01/17/2016   Influenza,inj,Quad PF,6+ Mos 11/12/2018   Influenza-Unspecified 04/02/2017, 12/08/2017   Moderna Covid-19 Vaccine Bivalent Booster 65yrs & up 01/31/2021   Moderna SARS-COV2 Booster Vaccination 01/13/2020, 09/25/2020   Moderna Sars-Covid-2 Vaccination 04/09/2019, 05/14/2019   Pneumococcal Conjugate-13 10/26/2014   Pneumococcal Polysaccharide-23 11/12/2018   Pneumococcal-Unspecified 09/14/2013   Td 07/18/2011   Unspecified SARS-COV-2 Vaccination 02/19/2022     Objective: Vital Signs: BP 113/68 (BP Location: Left Arm, Patient Position: Sitting, Cuff Size: Normal)   Pulse 67   Resp 14   Ht 5\' 3"  (1.6 m)   Wt 143 lb 9.6 oz (65.1 kg)   BMI 25.44 kg/m    Physical Exam Vitals and nursing note reviewed.  Constitutional:      Appearance: She is well-developed.  HENT:  Head: Normocephalic and atraumatic.  Eyes:     Conjunctiva/sclera: Conjunctivae normal.  Cardiovascular:     Rate and Rhythm: Normal rate and regular rhythm.     Heart sounds: Normal heart sounds.  Pulmonary:     Effort: Pulmonary effort is normal.     Breath sounds: Normal breath sounds.  Abdominal:     General: Bowel sounds are normal.     Palpations: Abdomen is soft.  Musculoskeletal:     Cervical back: Normal range of motion.  Lymphadenopathy:     Cervical: No cervical adenopathy.  Skin:    General: Skin is warm and dry.     Capillary Refill: Capillary refill takes more than 3 seconds.     Comments: Sclerodactyly was noted bilaterally.  Telangiectasias were noted on her bilateral hands and her face.  Nailbed capillary dropout was noted.  She had decreased capillary refill.  Neurological:     Mental Status: She is alert and oriented to person, place, and time.  Psychiatric:         Behavior: Behavior normal.      Musculoskeletal Exam: Cervical spine was in good range of motion.  Shoulder joints, elbow joints, wrist joints, MCPs PIPs and DIPs were in good range of motion.  She had some tightness with fist formation due to sclerodactyly.  No synovitis was noted.  No digital ulcers were noted.  Surgical scar was noted on her left wrist from carpal tunnel release.  Hip joints and knee joints in good range of motion.  There was no tenderness over ankles or MTPs.  CDAI Exam: CDAI Score: -- Patient Global: --; Provider Global: -- Swollen: --; Tender: -- Joint Exam 07/23/2022   No joint exam has been documented for this visit   There is currently no information documented on the homunculus. Go to the Rheumatology activity and complete the homunculus joint exam.  Investigation: No additional findings.  Imaging: No results found.  Recent Labs: Lab Results  Component Value Date   WBC 6.2 11/16/2021   HGB 12.9 11/20/2021   PLT 235 11/16/2021   NA 141 11/20/2021   K 3.9 11/20/2021   CL 106 11/16/2021   CO2 22 11/16/2021   GLUCOSE 124 (H) 11/16/2021   BUN 15 11/16/2021   CREATININE 0.85 11/16/2021   BILITOT 0.7 10/03/2021   ALKPHOS 68 10/03/2021   AST 19 10/03/2021   ALT 16 10/03/2021   PROT 6.9 10/03/2021   ALBUMIN 4.1 10/03/2021   CALCIUM 9.9 11/16/2021   GFRAA 78 07/11/2020    Speciality Comments: PLQ Eye Exam: 08/07/17 PLQ toxcity,  PLQ discotinued @ [facility]  Procedures:  No procedures performed Allergies: Avelox [moxifloxacin hcl in nacl] and Other   Assessment / Plan:     Visit Diagnoses: Scleroderma (HCC) - Limited systemic with Raynauds, Telangiectasia, sclerodactyly, arthralgias, erosions in right fifth and left third DIP, ANA centromere: -No active synovitis was noted.  She continues to have decreased capillary refill, Raynauds, Telengectesia and a sclerodactyly.  She has been having reflux symptoms for which she was evaluated by Dr.  Lavon Paganini recently.  She denies any history of increased shortness of breath or palpitations.  Patient had high-resolution CT in June 2023 which was negative for ILD.  Patient will schedule follow-up appointment with Dr. Colletta Maryland.  Plan: CBC with Differential/Platelet, COMPLETE METABOLIC PANEL WITH GFR, Urinalysis, Routine w reflex microscopic  High risk medication use - she is not on any immunosuppressive agents.  Dr. Dione Booze advised patient to stop  taking plaquenil-may 2019.  Raynaud's phenomenon without gangrene-she had decreased capillary refill.  No digital ulcers were noted.  Nailbed capillary dropout was noted.  Telengectesia's and sclerodactyly were noted.  Keeping core temperature warm and warm clothing was advised.  Chronic pain of left knee-she has been having discomfort of the left knee joint recently.  No warmth swelling or effusion was noted.  I handout on lower extremity muscle strengthening exercises was placed.  I advised her to contact me if she develops worsening of symptoms.  Osteopenia of multiple sites - DEXA 12/07/2019:The BMD measured at Femur Neck Left is 0.827 g/cm2 with a T-score of -1.5.  He may consider repeat DEXA scan.  History of uterine cancer-s/p total hysterectomy and bilateral salpingo-oophorectomy.  She was treated with chemotherapy.  History of gastroesophageal reflux-she is followed by Dr. 90 again.  She recently was switched to Protonix 40 mg in the morning and Pepcid 20 mg at bedtime.  She is also following reflux precautions.  There is history of gastric cancer in her brother and maternal uncle.  History of coronary artery disease -status post stent placement.  She is followed by Dr. Gala Romney.  Patient was advised to schedule another follow-up appointment this year.  History of hypothyroidism  H/O cardiac radiofrequency ablation - Followed by Dr. Gala Romney  Dyslipidemia  Orders: Orders Placed This Encounter  Procedures   CBC with Differential/Platelet    COMPLETE METABOLIC PANEL WITH GFR   Urinalysis, Routine w reflex microscopic   No orders of the defined types were placed in this encounter.    Follow-Up Instructions: Return in about 6 months (around 01/23/2023) for Scleroderma.   Pollyann Savoy, MD  Note - This record has been created using Animal nutritionist.  Chart creation errors have been sought, but may not always  have been located. Such creation errors do not reflect on  the standard of medical care.

## 2022-07-15 ENCOUNTER — Encounter: Payer: Self-pay | Admitting: Gastroenterology

## 2022-07-15 ENCOUNTER — Ambulatory Visit (AMBULATORY_SURGERY_CENTER): Payer: Medicare Other | Admitting: Gastroenterology

## 2022-07-15 VITALS — BP 112/55 | HR 52 | Temp 97.3°F | Resp 13 | Ht 64.0 in | Wt 140.0 lb

## 2022-07-15 DIAGNOSIS — R12 Heartburn: Secondary | ICD-10-CM | POA: Diagnosis not present

## 2022-07-15 DIAGNOSIS — K2990 Gastroduodenitis, unspecified, without bleeding: Secondary | ICD-10-CM

## 2022-07-15 DIAGNOSIS — K3189 Other diseases of stomach and duodenum: Secondary | ICD-10-CM

## 2022-07-15 DIAGNOSIS — R1013 Epigastric pain: Secondary | ICD-10-CM

## 2022-07-15 HISTORY — PX: UPPER GI ENDOSCOPY: SHX6162

## 2022-07-15 MED ORDER — SODIUM CHLORIDE 0.9 % IV SOLN: 500.0000 mL | Freq: Once | INTRAVENOUS | Status: DC

## 2022-07-15 NOTE — Progress Notes (Signed)
Report to PACU, RN, vss, BBS= Clear.  

## 2022-07-15 NOTE — Patient Instructions (Signed)
Thank you for coming in to see [place] today. Resume previous diet and mediations today. Return to regular daily activities tomorrow. Pathology results will be available in 1-2 weeks.  You will be notified if there is any treatment needed.   YOU HAD AN ENDOSCOPIC PROCEDURE TODAY AT THE [place] [facility]:   Refer to the procedure report that was given to you for any specific questions about what was found during the examination.  If the procedure report does not answer your questions, please call your gastroenterologist to clarify.  If you requested that your care partner not be given the details of your procedure findings, then the procedure report has been included in a sealed envelope for you to review at your convenience later.  YOU SHOULD EXPECT: Some feelings of bloating in the abdomen. Passage of more gas than usual.  Walking can help get rid of the air that was put into your GI tract during the procedure and reduce the bloating. If you had a lower endoscopy (such as a colonoscopy or flexible sigmoidoscopy) you may notice spotting of blood in your stool or on the toilet paper. If you underwent a bowel prep for your procedure, you may not have a normal bowel movement for a few days.  Please Note:  You might notice some irritation and congestion in your nose or some drainage.  This is from the oxygen used during your procedure.  There is no need for concern and it should clear up in a day or so.  SYMPTOMS TO REPORT IMMEDIATELY:    Following upper endoscopy (EGD)  Vomiting of blood or coffee ground material  New chest pain or pain under the shoulder blades  Painful or persistently difficult swallowing  New shortness of breath  Fever of 100F or higher  Black, tarry-looking stools  For urgent or emergent issues, a gastroenterologist can be reached at any hour by calling [phone]. Do not use MyChart messaging for urgent concerns.    DIET:  We do recommend a small meal at first,  but then you may proceed to your regular diet.  Drink plenty of fluids but you should avoid alcoholic beverages for 24 hours.  ACTIVITY:  You should plan to take it easy for the rest of today and you should NOT DRIVE or use heavy machinery until tomorrow (because of the sedation medicines used during the test).    FOLLOW UP: Our staff will call the number listed on your records the next business day following your procedure.  We will call around 7:15- 8:00 am to check on you and address any questions or concerns that you may have regarding the information given to you following your procedure. If we do not reach you, we will leave a message.     If any biopsies were taken you will be contacted by phone or by letter within the next 1-3 weeks.  Please call us at [phone] if you have not heard about the biopsies in 3 weeks.    SIGNATURES/CONFIDENTIALITY: You and/or your care partner have signed paperwork which will be entered into your electronic medical record.  These signatures attest to the fact that that the information above on your After Visit Summary has been reviewed and is understood.  Full responsibility of the confidentiality of this discharge information lies with you and/or your care-partner.

## 2022-07-15 NOTE — Progress Notes (Signed)
Called to room to assist during endoscopic procedure.  Patient ID and intended procedure confirmed with present staff. Received instructions for my participation in the procedure from the performing physician.  

## 2022-07-15 NOTE — Progress Notes (Signed)
[facility] History and Physical   Primary Care Physician:  Doreene Eland, MD   Reason for Procedure:  Epigastric discomfort, family h/o gastric cancer  Plan:    EGD with possible interventions as needed     HPI: Rhonda Brewer is a very pleasant 72 y.o. female here for EGD for evaluation of epigastric abd pain.  The risks and benefits as well as alternatives of endoscopic procedure(s) have been discussed and reviewed. All questions answered. The patient agrees to proceed.    Past Medical History:  Diagnosis Date   Allergy    Arthritis    CAD (coronary artery disease)    a. DES to RCA 11/2015   Cancer [facility])    Uterine 2008   Cataract    CHF (congestive heart failure) (HCC)    Heart murmur    Hyperlipidemia    Osteoporosis    Thyroid disease     Past Surgical History:  Procedure Laterality Date   ABDOMINAL HYSTERECTOMY     APPENDECTOMY     CARDIAC ELECTROPHYSIOLOGY MAPPING AND ABLATION  04/19/2003   CARPAL TUNNEL RELEASE Right 10/15/2017   CATARACT EXTRACTION Bilateral    07/2021 R 08/2021 L   CORONARY ANGIOPLASTY WITH STENT PLACEMENT     RIGHT/LEFT HEART CATH AND CORONARY ANGIOGRAPHY [name] 08/14/2017   Procedure: RIGHT/LEFT HEART CATH AND CORONARY ANGIOGRAPHY;  Surgeon: Dolores Patty, MD;  Location: [facility] INVASIVE CV LAB;  Service: Cardiovascular;  Laterality: [name];   RIGHT/LEFT HEART CATH AND CORONARY ANGIOGRAPHY [name] 11/20/2021   Procedure: RIGHT/LEFT HEART CATH AND CORONARY ANGIOGRAPHY;  Surgeon: Dolores Patty, MD;  Location: [facility] INVASIVE CV LAB;  Service: Cardiovascular;  Laterality: [name];   ULTRASOUND GUIDANCE FOR VASCULAR ACCESS  08/14/2017   Procedure: Ultrasound Guidance For Vascular Access;  Surgeon: Dolores Patty, MD;  Location: [facility] INVASIVE CV LAB;  Service: Cardiovascular;;    Prior to Admission medications   Medication Sig Start Date End Date Taking? Authorizing Provider  acetaminophen (TYLENOL) 500 MG tablet Take 500-1,000 mg by  mouth every 6 (six) hours as needed (pain.).   Yes [provider]  aspirin EC 81 MG tablet Take 81 mg by mouth at bedtime. 12/05/14  Yes [provider]  atorvastatin (LIPITOR) 40 MG tablet Take 1 tablet (40 mg total) by mouth daily. 04/02/22  Yes Bensimhon, Bevelyn Buckles, MD  Calcium Carbonate-Vitamin D (CALCIUM-VITAMIN D3 PO) Take 1 tablet by mouth in the morning and at bedtime.   Yes [provider]  Cyanocobalamin (VITAMIN B-12 PO) Take 1 tablet by mouth in the morning.   Yes [provider]  cycloSPORINE (RESTASIS) 0.05 % ophthalmic emulsion Place 1 drop into both eyes 2 (two) times daily.   Yes [provider]  Evolocumab (REPATHA SURECLICK) 140 MG/ML SOAJ Inject 1 mL into the skin every 14 (fourteen) days. 12/04/21  Yes Bensimhon, Bevelyn Buckles, MD  fexofenadine [facility] ALLERGY) 180 MG tablet    Yes [provider]  levothyroxine (SYNTHROID) 75 MCG tablet TAKE 1 TABLET BY MOUTH  DAILY BEFORE BREAKFAST 09/10/21  Yes Hensel, Santiago Bumpers, MD  Multiple Vitamin (MULTI-VITAMINS) TABS Take 1 tablet by mouth in the morning.   Yes [provider]  pantoprazole (PROTONIX) 40 MG tablet Take 1 tablet (40 mg total) by mouth daily. 07/05/22  Yes Doreene Eland, MD  albuterol (VENTOLIN HFA) 108 (90 Base) MCG/ACT inhaler Inhale 2 puffs into the lungs every 4 (four) hours as needed for wheezing or shortness of breath. Patient not  taking: Reported on 07/15/2022 08/09/21   [name], MD  famotidine (PEPCID) 20 MG tablet TAKE 1 TABLET(20 MG) BY MOUTH AT BEDTIME Patient not taking: Reported on 07/15/2022 05/15/22   Napoleon Form, MD  valACYclovir (VALTREX) 500 MG tablet Take 500 mg by mouth See admin instructions. Take 1 tablet (500 mg) by mouth twice daily x 5 day at onset of outbreak. Patient not taking: Reported on 07/15/2022    [provider]    Current Outpatient Medications  Medication Sig Dispense Refill   acetaminophen (TYLENOL) 500  MG tablet Take 500-1,000 mg by mouth every 6 (six) hours as needed (pain.).     aspirin EC 81 MG tablet Take 81 mg by mouth at bedtime.     atorvastatin (LIPITOR) 40 MG tablet Take 1 tablet (40 mg total) by mouth daily. 90 tablet 3   Calcium Carbonate-Vitamin D (CALCIUM-VITAMIN D3 PO) Take 1 tablet by mouth in the morning and at bedtime.     Cyanocobalamin (VITAMIN B-12 PO) Take 1 tablet by mouth in the morning.     cycloSPORINE (RESTASIS) 0.05 % ophthalmic emulsion Place 1 drop into both eyes 2 (two) times daily.     Evolocumab (REPATHA SURECLICK) 140 MG/ML SOAJ Inject 1 mL into the skin every 14 (fourteen) days. 6 mL 3   fexofenadine (ALLEGRA ALLERGY) 180 MG tablet      levothyroxine (SYNTHROID) 75 MCG tablet TAKE 1 TABLET BY MOUTH  DAILY BEFORE BREAKFAST 90 tablet 3   Multiple Vitamin (MULTI-VITAMINS) TABS Take 1 tablet by mouth in the morning.     pantoprazole (PROTONIX) 40 MG tablet Take 1 tablet (40 mg total) by mouth daily. 100 tablet 0   albuterol (VENTOLIN HFA) 108 (90 Base) MCG/ACT inhaler Inhale 2 puffs into the lungs every 4 (four) hours as needed for wheezing or shortness of breath. (Patient not taking: Reported on 07/15/2022) 18 g 6   famotidine (PEPCID) 20 MG tablet TAKE 1 TABLET(20 MG) BY MOUTH AT BEDTIME (Patient not taking: Reported on 07/15/2022) 90 tablet 3   valACYclovir (VALTREX) 500 MG tablet Take 500 mg by mouth See admin instructions. Take 1 tablet (500 mg) by mouth twice daily x 5 day at onset of outbreak. (Patient not taking: Reported on 07/15/2022)     Current Facility-Administered Medications  Medication Dose Route Frequency Provider Last Rate Last Admin   0.9 %  sodium chloride infusion  500 mL Intravenous Once Napoleon Form, MD        Allergies as of 07/15/2022 - Review Complete 07/15/2022  Allergen Reaction Noted   Avelox [moxifloxacin hcl in nacl] Other (See Comments) 06/19/2016   Other Other (See Comments) 07/12/2015    Family History  Problem Relation  Age of Onset   Asthma Maternal Aunt    Heart disease Mother    Diabetes Mother    Heart disease Father    Stomach cancer Brother    Diabetes Brother    Colon cancer Neg Hx    Esophageal cancer Neg Hx    Rectal cancer Neg Hx     Social History   Socioeconomic History   Marital status: Married    Spouse name: Juliene Pina   Number of children: 2   Years of education: 12   Highest education level: 12th grade  Occupational History   Occupation: retired    Comment: Research scientist (medical)  Tobacco Use   Smoking status: Never    Passive exposure: Never   Smokeless tobacco: Never  Vaping  Use   Vaping Use: Never used  Substance and Sexual Activity   Alcohol use: Yes    Comment: occasional wine   Drug use: No   Sexual activity: Yes    Birth control/protection: Surgical    Comment: hysterectomy  Other Topics Concern   Not on file  Social History Narrative   Lives in home with husband. Daughters live out of state. Step-daughter in [place]. Has 8 grandchildren. Lives in 2 level home but most living is on first floor. Stairs have handrails, no problem with stairs. No grab bars in bathroom. No tripping hazards. Smoke alarms present.   No pets.    Eats a good variety of foods, husband is a cook. Eats meats, fruits, vegetables. Drinks water mostly. Occasional tea.   Goes to gym 5 times a week. Enjoys reading, sewing, movies. Walking when weather is nice.    Wears seat belt in car.    Social Determinants of Health   Financial Resource Strain: Low Risk  (05/10/2022)   Overall Financial Resource Strain (CARDIA)    Difficulty of Paying Living Expenses: Not hard at all  Food Insecurity: No Food Insecurity (05/10/2022)   Hunger Vital Sign    Worried About Running Out of Food in the Last Year: Never true    Ran Out of Food in the Last Year: Never true  Transportation Needs: No Transportation Needs (05/10/2022)   PRAPARE - Administrator, Civil Service (Medical): No    Lack of  Transportation (Non-Medical): No  Physical Activity: Sufficiently Active (05/10/2022)   Exercise Vital Sign    Days of Exercise per Week: 6 days    Minutes of Exercise per Session: 90 min  Stress: No Stress Concern Present (05/10/2022)   [employer] of Occupational Health - Occupational Stress Questionnaire    Feeling of Stress : Not at all  Social Connections: Moderately Integrated (05/10/2022)   Social Connection and Isolation Panel [NHANES]    Frequency of Communication with Friends and Family: More than three times a week    Frequency of Social Gatherings with Friends and Family: More than three times a week    Attends Religious Services: Never    Database administrator or Organizations: No    Attends Engineer, structural: More than 4 times per year    Marital Status: Married  Catering manager Violence: Not At Risk (05/10/2022)   Humiliation, Afraid, Rape, and Kick questionnaire    Fear of Current or Ex-Partner: No    Emotionally Abused: No    Physically Abused: No    Sexually Abused: No    Review of Systems:  All other review of systems negative except as mentioned in the HPI.  Physical Exam: Vital signs in last 24 hours: Blood Pressure (Abnormal) 141/68   Pulse 61   Temperature (Abnormal) 97.3 F (36.3 C) (Skin)   Respiration 13   Height 5\' 4"  (1.626 m)   Weight 140 lb (63.5 kg)   Oxygen Saturation 96%   Body Mass Index 24.03 kg/m  General:   Alert, NAD Lungs:  Clear .   Heart:  Regular rate and rhythm Abdomen:  Soft, nontender and nondistended. Neuro/Psych:  Alert and cooperative. Normal mood and affect. A and O x 3  Reviewed labs, radiology imaging, old records and pertinent past GI work up  Patient is appropriate for planned procedure(s) and anesthesia in an ambulatory setting   K. Scherry Ran , MD [phone]

## 2022-07-15 NOTE — Progress Notes (Signed)
Pt's states no medical or surgical changes since previsit or office visit. VS assessed by D.T 

## 2022-07-15 NOTE — Op Note (Signed)
[facility] Patient Name: Rhonda Brewer Procedure Date: 07/15/2022 10:01 AM MRN: [id number] Endoscopist: Napoleon Form , MD, [id number] Age: 72 Referring MD:  Date of Birth: 1950/06/16 Gender: Female Account #: [id number] Procedure:                Upper GI endoscopy Indications:              Epigastric abdominal pain Medicines:                Monitored Anesthesia Care Procedure:                Pre-Anesthesia Assessment:                           - Prior to the procedure, a History and Physical                            was performed, and patient medications and                            allergies were reviewed. The patient's tolerance of                            previous anesthesia was also reviewed. The risks                            and benefits of the procedure and the sedation                            options and risks were discussed with the patient.                            All questions were answered, and informed consent                            was obtained. Prior Anticoagulants: The patient has                            taken no anticoagulant or antiplatelet agents. ASA                            Grade Assessment: II - A patient with mild systemic                            disease. After reviewing the risks and benefits,                            the patient was deemed in satisfactory condition to                            undergo the procedure.                           After obtaining informed consent, the endoscope was  passed under direct vision. Throughout the                            procedure, the patient's blood pressure, pulse, and                            oxygen saturations were monitored continuously. The                            GIF [id number] #[id number] was introduced through the                            mouth, and advanced to the second part of duodenum.                            The upper GI  endoscopy was accomplished without                            difficulty. The patient tolerated the procedure                            well. Scope In: Scope Out: Findings:                 The Z-line was regular and was found 36 cm from the                            incisors.                           A 2 cm hiatal hernia was present.                           Patchy mild inflammation characterized by erythema                            was found in the entire examined stomach. Biopsies                            were taken with a cold forceps for Helicobacter                            pylori testing.                           The cardia and gastric fundus were normal on                            retroflexion.                           Patchy mildly erythematous mucosa without active                            bleeding and with no stigmata of bleeding was found  in the first portion of the duodenum and in the                            second portion of the duodenum. Biopsies for                            histology were taken with a cold forceps for                            evaluation of celiac disease. Complications:            No immediate complications. Estimated Blood Loss:     Estimated blood loss was minimal. Impression:               - Z-line regular, 36 cm from the incisors.                           - 2 cm hiatal hernia.                           - Gastritis. Biopsied.                           - Erythematous duodenopathy. Biopsied. Recommendation:           - Patient has a contact number available for                            emergencies. The signs and symptoms of potential                            delayed complications were discussed with the                            patient. Return to normal activities tomorrow.                            Written discharge instructions were provided to the                            patient.                            - Resume previous diet.                           - Continue present medications.                           - Await pathology results. Napoleon Form, MD 07/15/2022 10:28:46 AM This report has been signed electronically.

## 2022-07-16 ENCOUNTER — Telehealth: Payer: Self-pay | Admitting: *Deleted

## 2022-07-16 NOTE — Telephone Encounter (Signed)
  Follow up Call-     07/15/2022    9:14 AM  Call back number  Post procedure Call Back phone  # [phone]  Permission to leave phone message Yes     Patient questions:  Do you have a fever, pain , or abdominal swelling? No. Pain Score  0 *  Have you tolerated food without any problems? Yes.    Have you been able to return to your normal activities? Yes.    Do you have any questions about your discharge instructions: Diet   No. Medications  No. Follow up visit  No.  Do you have questions or concerns about your Care? No.  Actions: * If pain score is 4 or above: No action needed, pain <4.

## 2022-07-23 ENCOUNTER — Ambulatory Visit: Payer: Medicare Other | Attending: Rheumatology | Admitting: Rheumatology

## 2022-07-23 ENCOUNTER — Encounter: Payer: Self-pay | Admitting: Rheumatology

## 2022-07-23 VITALS — BP 113/68 | HR 67 | Resp 14 | Ht 63.0 in | Wt 143.6 lb

## 2022-07-23 DIAGNOSIS — Z8719 Personal history of other diseases of the digestive system: Secondary | ICD-10-CM

## 2022-07-23 DIAGNOSIS — M25562 Pain in left knee: Secondary | ICD-10-CM

## 2022-07-23 DIAGNOSIS — G8929 Other chronic pain: Secondary | ICD-10-CM | POA: Diagnosis not present

## 2022-07-23 DIAGNOSIS — Z9889 Other specified postprocedural states: Secondary | ICD-10-CM

## 2022-07-23 DIAGNOSIS — M8589 Other specified disorders of bone density and structure, multiple sites: Secondary | ICD-10-CM

## 2022-07-23 DIAGNOSIS — Z79899 Other long term (current) drug therapy: Secondary | ICD-10-CM

## 2022-07-23 DIAGNOSIS — Z8679 Personal history of other diseases of the circulatory system: Secondary | ICD-10-CM | POA: Diagnosis not present

## 2022-07-23 DIAGNOSIS — E785 Hyperlipidemia, unspecified: Secondary | ICD-10-CM | POA: Diagnosis not present

## 2022-07-23 DIAGNOSIS — Z8542 Personal history of malignant neoplasm of other parts of uterus: Secondary | ICD-10-CM

## 2022-07-23 DIAGNOSIS — M349 Systemic sclerosis, unspecified: Secondary | ICD-10-CM | POA: Diagnosis not present

## 2022-07-23 DIAGNOSIS — I7300 Raynaud's syndrome without gangrene: Secondary | ICD-10-CM | POA: Diagnosis not present

## 2022-07-23 DIAGNOSIS — Z8639 Personal history of other endocrine, nutritional and metabolic disease: Secondary | ICD-10-CM | POA: Diagnosis not present

## 2022-07-23 LAB — CBC WITH DIFFERENTIAL/PLATELET
Basophils Absolute: 19 cells/uL (ref 0–200)
Basophils Relative: 0.3 %
Eosinophils Relative: 2.9 %
MCH: 32.1 pg (ref 27.0–33.0)
Neutrophils Relative %: 56.4 %
RBC: 4.14 10*6/uL (ref 3.80–5.10)
WBC: 6.2 10*3/uL (ref 3.8–10.8)

## 2022-07-23 NOTE — Patient Instructions (Signed)

## 2022-07-24 LAB — URINALYSIS, ROUTINE W REFLEX MICROSCOPIC
Bacteria, UA: NONE SEEN /HPF
Bilirubin Urine: NEGATIVE
Glucose, UA: NEGATIVE
Hgb urine dipstick: NEGATIVE
Hyaline Cast: NONE SEEN /LPF
Ketones, ur: NEGATIVE
Nitrite: NEGATIVE
Protein, ur: NEGATIVE
RBC / HPF: NONE SEEN /HPF (ref 0–2)
Specific Gravity, Urine: 1.008 (ref 1.001–1.035)
Squamous Epithelial / HPF: NONE SEEN /HPF (ref <=5)
pH: 5.5 (ref 5.0–8.0)

## 2022-07-24 LAB — CBC WITH DIFFERENTIAL/PLATELET
Absolute Monocytes: 577 cells/uL (ref 200–950)
Eosinophils Absolute: 180 cells/uL (ref 15–500)
HCT: 39.4 % (ref 35.0–45.0)
Hemoglobin: 13.3 g/dL (ref 11.7–15.5)
Lymphs Abs: 1928 cells/uL (ref 850–3900)
MCHC: 33.8 g/dL (ref 32.0–36.0)
MCV: 95.2 fL (ref 80.0–100.0)
MPV: 10.5 fL (ref 7.5–12.5)
Monocytes Relative: 9.3 %
Neutro Abs: 3497 cells/uL (ref 1500–7800)
Platelets: 198 10*3/uL (ref 140–400)
RDW: 11.8 % (ref 11.0–15.0)
Total Lymphocyte: 31.1 %

## 2022-07-24 LAB — COMPLETE METABOLIC PANEL WITH GFR
AG Ratio: 1.9 (calc) (ref 1.0–2.5)
ALT: 12 U/L (ref 6–29)
AST: 18 U/L (ref 10–35)
Albumin: 4.2 g/dL (ref 3.6–5.1)
Alkaline phosphatase (APISO): 65 U/L (ref 37–153)
BUN: 15 mg/dL (ref 7–25)
CO2: 23 mmol/L (ref 20–32)
Calcium: 9.3 mg/dL (ref 8.6–10.4)
Chloride: 108 mmol/L (ref 98–110)
Creat: 0.72 mg/dL (ref 0.60–1.00)
Globulin: 2.2 g/dL (calc) (ref 1.9–3.7)
Glucose, Bld: 119 mg/dL — ABNORMAL HIGH (ref 65–99)
Potassium: 4 mmol/L (ref 3.5–5.3)
Sodium: 140 mmol/L (ref 135–146)
Total Bilirubin: 0.4 mg/dL (ref 0.2–1.2)
Total Protein: 6.4 g/dL (ref 6.1–8.1)
eGFR: 89 mL/min/{1.73_m2} (ref >=60)

## 2022-07-24 LAB — MICROSCOPIC MESSAGE

## 2022-07-24 NOTE — Progress Notes (Signed)
CBC, CMP and UA are within normal limits.  Glucose is mildly, probably not a fasting sample.

## 2022-07-26 ENCOUNTER — Encounter: Payer: Self-pay | Admitting: Gastroenterology

## 2022-09-12 ENCOUNTER — Other Ambulatory Visit: Payer: Self-pay

## 2022-09-12 MED ORDER — LEVOTHYROXINE SODIUM 75 MCG PO TABS: 75.0000 ug | ORAL_TABLET | Freq: Every day | ORAL | 0 refills | Status: DC

## 2022-09-15 ENCOUNTER — Other Ambulatory Visit: Payer: Self-pay | Admitting: Family Medicine

## 2022-09-18 ENCOUNTER — Encounter: Payer: Self-pay | Admitting: Family Medicine

## 2022-09-20 MED ORDER — LEVOTHYROXINE SODIUM 75 MCG PO TABS: 75.0000 ug | ORAL_TABLET | Freq: Every day | ORAL | 3 refills | Status: DC

## 2022-09-22 ENCOUNTER — Other Ambulatory Visit: Payer: Self-pay

## 2022-09-22 ENCOUNTER — Encounter (HOSPITAL_COMMUNITY): Payer: Self-pay | Admitting: Emergency Medicine

## 2022-09-22 ENCOUNTER — Emergency Department (HOSPITAL_COMMUNITY)
Admission: EM | Admit: 2022-09-22 | Discharge: 2022-09-22 | Disposition: A | Discharge status: Home / Self Care | Payer: Medicare Other | Attending: Emergency Medicine | Admitting: Emergency Medicine

## 2022-09-22 ENCOUNTER — Emergency Department (HOSPITAL_COMMUNITY): Payer: Medicare Other

## 2022-09-22 DIAGNOSIS — R079 Chest pain, unspecified: Secondary | ICD-10-CM | POA: Diagnosis not present

## 2022-09-22 DIAGNOSIS — I2089 Other forms of angina pectoris: Secondary | ICD-10-CM | POA: Diagnosis not present

## 2022-09-22 DIAGNOSIS — I209 Angina pectoris, unspecified: Secondary | ICD-10-CM | POA: Diagnosis not present

## 2022-09-22 DIAGNOSIS — Z7982 Long term (current) use of aspirin: Secondary | ICD-10-CM | POA: Diagnosis not present

## 2022-09-22 DIAGNOSIS — I509 Heart failure, unspecified: Secondary | ICD-10-CM | POA: Insufficient documentation

## 2022-09-22 DIAGNOSIS — R7989 Other specified abnormal findings of blood chemistry: Secondary | ICD-10-CM | POA: Insufficient documentation

## 2022-09-22 DIAGNOSIS — Z8542 Personal history of malignant neoplasm of other parts of uterus: Secondary | ICD-10-CM | POA: Diagnosis not present

## 2022-09-22 DIAGNOSIS — R0789 Other chest pain: Secondary | ICD-10-CM | POA: Diagnosis present

## 2022-09-22 LAB — CBC
HCT: 43.1 % (ref 36.0–46.0)
Hemoglobin: 14.1 g/dL (ref 12.0–15.0)
MCH: 31.3 pg (ref 26.0–34.0)
MCHC: 32.7 g/dL (ref 30.0–36.0)
MCV: 95.6 fL (ref 80.0–100.0)
Platelets: 234 10*3/uL (ref 150–400)
RBC: 4.51 MIL/uL (ref 3.87–5.11)
RDW: 12.4 % (ref 11.5–15.5)
WBC: 5.8 10*3/uL (ref 4.0–10.5)
nRBC: 0 % (ref 0.0–0.2)

## 2022-09-22 LAB — BASIC METABOLIC PANEL
Anion gap: 11 (ref 5–15)
BUN: 13 mg/dL (ref 8–23)
CO2: 23 mmol/L (ref 22–32)
Calcium: 9.4 mg/dL (ref 8.9–10.3)
Chloride: 105 mmol/L (ref 98–111)
Creatinine, Ser: 0.79 mg/dL (ref 0.44–1.00)
GFR, Estimated: >60 mL/min (ref >60)
Glucose, Bld: 93 mg/dL (ref 70–99)
Potassium: 4.3 mmol/L (ref 3.5–5.1)
Sodium: 139 mmol/L (ref 135–145)

## 2022-09-22 LAB — BRAIN NATRIURETIC PEPTIDE: B Natriuretic Peptide: 110.6 pg/mL — ABNORMAL HIGH (ref 0.0–100.0)

## 2022-09-22 LAB — TROPONIN I (HIGH SENSITIVITY)
Troponin I (High Sensitivity): 8 ng/L (ref <18)
Troponin I (High Sensitivity): 10 ng/L (ref <18)

## 2022-09-22 MED ORDER — ASPIRIN 81 MG PO CHEW
324.0000 mg | CHEWABLE_TABLET | Freq: Once | ORAL | Status: AC
  Administered 2022-09-22: 324 mg via ORAL
  Filled 2022-09-22: qty 4

## 2022-09-22 NOTE — ED Triage Notes (Signed)
Pt complains of left side chest pain x 1 day with SOB. Pain does not radiate. No nausea or vomiting.

## 2022-09-22 NOTE — ED Provider Notes (Signed)
[place] [facility]ERGENCY DEPARTMENT AT [facility] Provider Note   CSN: [id number] Arrival date & time: 09/22/22  1001     History  Chief Complaint  Patient presents with   Chest Pain    Rhonda Brewer is a 72 y.o. female with PMHx CAD s/p stent 2017, CHF, HLD, uterine cancer in remission who presents to ED concerned for left sided chest pain x1 day with SOB while vacuuming. Pain does not radiate. Symptoms resolve with rest within a minute. Takes ASA and statin daily.   Denies fever, cough, abdominal pain, nausea, vomiting, syncope.   Chest Pain      Home Medications Prior to Admission medications   Medication Sig Start Date End Date Taking? Authorizing Provider  acetaminophen (TYLENOL) 500 MG tablet Take 500-1,000 mg by mouth every 6 (six) hours as needed (pain.).    [provider]  albuterol (VENTOLIN HFA) 108 (90 Base) MCG/ACT inhaler Inhale 2 puffs into the lungs every 4 (four) hours as needed for wheezing or shortness of breath. 08/09/21   [name], MD  aspirin EC 81 MG tablet Take 81 mg by mouth at bedtime. 12/05/14   [provider]  atorvastatin (LIPITOR) 40 MG tablet Take 1 tablet (40 mg total) by mouth daily. 04/02/22   Bensimhon, Bevelyn Buckles, MD  Calcium Carbonate-Vitamin D (CALCIUM-VITAMIN D3 PO) Take 1 tablet by mouth in the morning and at bedtime.    [provider]  Cyanocobalamin (VITAMIN B-12 PO) Take 1 tablet by mouth in the morning.    [provider]  cycloSPORINE (RESTASIS) 0.05 % ophthalmic emulsion Place 1 drop into both eyes 2 (two) times daily.    [provider]  Evolocumab (REPATHA SURECLICK) 140 MG/ML SOAJ Inject 1 mL into the skin every 14 (fourteen) days. 12/04/21   Bensimhon, Bevelyn Buckles, MD  famotidine (PEPCID) 20 MG tablet TAKE 1 TABLET(20 MG) BY MOUTH AT BEDTIME 05/15/22   Nandigam, Eleonore Chiquito, MD  fexofenadine (ALLEGRA ALLERGY) 180 MG tablet     [provider]  levothyroxine (SYNTHROID)  75 MCG tablet Take 1 tablet (75 mcg total) by mouth daily before breakfast. 09/20/22   Caro Laroche, DO  Multiple Vitamin (MULTI-VITAMINS) TABS Take 1 tablet by mouth in the morning.    [provider]  pantoprazole (PROTONIX) 40 MG tablet TAKE 1 TABLET BY MOUTH DAILY 09/16/22   Caro Laroche, DO  valACYclovir (VALTREX) 500 MG tablet Take 500 mg by mouth See admin instructions. Take 1 tablet (500 mg) by mouth twice daily x 5 day at onset of outbreak.    [provider]      Allergies    Avelox [moxifloxacin hcl in nacl] and Other    Review of Systems   Review of Systems  Cardiovascular:  Positive for chest pain.    Physical Exam Updated Vital Signs BP 122/69   Pulse 62   Temp 98.3 F (36.8 C) (Oral)   Resp 19   Wt 65 kg   SpO2 100%   BMI 25.38 kg/m  Physical Exam Vitals and nursing note reviewed.  Constitutional:      General: She is not in acute distress.    Appearance: She is not ill-appearing, toxic-appearing or diaphoretic.  HENT:     Head: Normocephalic and atraumatic.     Mouth/Throat:     Mouth: Mucous membranes are moist.     Pharynx: No oropharyngeal exudate or posterior oropharyngeal erythema.  Eyes:     General:  No scleral icterus.       Right eye: No discharge.        Left eye: No discharge.     Conjunctiva/sclera: Conjunctivae normal.  Cardiovascular:     Rate and Rhythm: Normal rate and regular rhythm.     Pulses: Normal pulses.          Radial pulses are 2+ on the right side and 2+ on the left side.       Dorsalis pedis pulses are 2+ on the right side and 2+ on the left side.     Heart sounds: Normal heart sounds. No murmur heard.    Comments: No JVD, no LE swelling/edema Pulmonary:     Effort: Pulmonary effort is normal. No respiratory distress.     Breath sounds: Normal breath sounds. No wheezing, rhonchi or rales.  Abdominal:     Tenderness: There is no abdominal tenderness.  Musculoskeletal:     Right lower leg: No edema.      Left lower leg: No edema.  Skin:    General: Skin is warm and dry.     Findings: No rash.  Neurological:     General: No focal deficit present.     Mental Status: She is alert. Mental status is at baseline.  Psychiatric:        Mood and Affect: Mood normal.     ED Results / Procedures / Treatments   Labs (all labs ordered are listed, but only abnormal results are displayed) Labs Reviewed  BRAIN NATRIURETIC PEPTIDE - Abnormal; Notable for the following components:      Result Value   B Natriuretic Peptide 110.6 (*)    All other components within normal limits  BASIC METABOLIC PANEL  CBC  TROPONIN I (HIGH SENSITIVITY)  TROPONIN I (HIGH SENSITIVITY)    EKG EKG Interpretation Date/Time:  Sunday September 22 2022 10:09:35 EDT Ventricular Rate:  58 PR Interval:  140 QRS Duration:  90 QT Interval:  416 QTC Calculation: 409 R Axis:   83  Text Interpretation: Sinus rhythm Borderline right axis deviation No significant change since last tracing Confirmed by Alvira Monday ([id number]) on 09/22/2022 10:42:41 AM  Radiology DG Chest 2 View  Result Date: 09/22/2022 CLINICAL DATA:  Chest pain EXAM: CHEST - 2 VIEW COMPARISON:  11/14/2021 FINDINGS: Normal heart size. No pleural fluid or interstitial edema. No airspace opacities. Scar like opacity is noted within the left midlung. The visualized osseous structures are unremarkable. IMPRESSION: No active cardiopulmonary disease. Electronically Signed   By: Signa Kell M.D.   On: 09/22/2022 10:32    Procedures Procedures    Medications Ordered in ED Medications  aspirin chewable tablet 324 mg (324 mg Oral Given 09/22/22 1146)    ED Course/ Medical Decision Making/ A&P                             Medical Decision Making Amount and/or Complexity of Data Reviewed Labs: ordered. Radiology: ordered.  Risk OTC drugs.   This patient presents to the ED for concern of chest pain, this involves an extensive number of treatment options,  and is a complaint that carries with it a high risk of complications and morbidity.  The differential diagnosis includes acute coronary syndrome, congestive heart failure, pericarditis, pneumonia, pulmonary embolism, tension pneumothorax, esophageal rupture, aortic dissection, cardiac tamponade, musculoskeletal   Co morbidities that complicate the patient evaluation  CAD s/p stent   Additional history obtained:  Additional history obtained from Cardiology note 11/2021 "1. CAD with patent RCA stent and mild non-obstructive CAD in LAD 2. Normal LVEF 60-65% 3. Normal right heart pressures with no evidence of PAH 4. Small step-up in saturations at RA level suggestive of anomalous pulmonary vein or shunt at atrial level"   Lab Tests:  I Ordered, and personally interpreted labs.  The pertinent results include:  - Troponin: 8 -> 10 - BMP: no concern for electrolyte abnormality; no concern for kidney damage - CBC: No concern for anemia or leukocytosis - BNP: 110.6   Imaging Studies ordered:  I ordered imaging studies including  -chest X-ray: To assess for process contributing to patient's symptoms I independently visualized and interpreted imaging which I agree with the radiologist interpretation   Cardiac Monitoring: / EKG:  The patient was maintained on a cardiac monitor.  I personally viewed and interpreted the cardiac monitored which showed an underlying rhythm of: Sinus rhythm without acute ST changes or arrhythmias.   Consultations Obtained:  I requested consultation with the Cardiologist,  and discussed lab and imaging findings as well as pertinent plan - they recommend: non-emergent outpatient f/u with patient's PCP or cardiologist   Problem List / ED Course / Critical interventions / Medication management  Patient presented for exertional chest pain that relieves with rest. On arrival to ED, patient was given ASA and an EKG was obtained.  Physical exam  unremarkable-patient not diaphoretic, toxic or tachycardic. EKG/Troponin without concern for ACS.  Chest x-ray without acute cardiopulmonary disease.  BNP mildly elevated at 110.  Rest of blood work without concern. Consulted cardiologist who recommends following up with cardiology non-urgently.  Patient with exertional angina that relieves with within a minute of rest - concerning for stable angina.  Provided patient with return precautions to include chest pain that is not relieved with rest.  Patient currently on appropriate medical management for stable angina per cardiology consult.  Recommended patient follow-up with her primary care provider or cardiologist.  Patient stating that she will call her cardiologist tomorrow for follow-up. I have reviewed the patients home medicines and have made adjustments as needed Patient was given return precautions. Patient stable for discharge at this time.  Patient verbalized understanding of plan.  Ddx:  These are considered less likely due to history of present illness and physical exam findings.  Acute coronary syndrome: EKG and troponins within normal limits  Congestive heart failure: patient denies orthopnea, cough, and leg edema Pericarditis: pain is not positional and patient denies orthopnea and recent illness Pneumonia: lungs are clear to auscultation bilaterally Pulmonary embolism: no recent surgeries, blood clot hx, hemoptysis. No tachycardia or hypoxia Pneumothorax: lungs are clear to auscultation bilaterally Esophageal rupture: patient denies vomiting, heavy drinking, and hx of GERD Aortic dissection: vital signs are stable, no variation in pulse pressure Cardiac tamponade: absence of hypotension, JVD, and muffled heart sounds    Social Determinants of Health:  None         Final Clinical Impression(s) / ED Diagnoses Final diagnoses:  Stable angina    Rx / DC Orders ED Discharge Orders     None         Dorthy Cooler, [place] 09/22/22 1606    Alvira Monday, MD 09/23/22 2101

## 2022-09-22 NOTE — Discharge Instructions (Addendum)
Is was pleasure seeing you today.  Troponins today were negative.  It appears that you have stable angina.  I talked to the cardiologist here in the hospital who recommends outpatient follow-up.  Please reach out to cardiologist tomorrow for follow-up.  Seek emergency care if experiencing any new or worsening symptoms.

## 2022-09-22 NOTE — ED Notes (Signed)
Patient transported to X-ray 

## 2022-09-23 DIAGNOSIS — M25561 Pain in right knee: Secondary | ICD-10-CM | POA: Diagnosis not present

## 2022-09-24 ENCOUNTER — Ambulatory Visit (HOSPITAL_COMMUNITY)
Admission: RE | Admit: 2022-09-24 | Discharge: 2022-09-24 | Disposition: A | Discharge status: Home / Self Care | Payer: Medicare Other | Source: Ambulatory Visit | Attending: Internal Medicine | Admitting: Internal Medicine

## 2022-09-24 ENCOUNTER — Encounter (HOSPITAL_COMMUNITY): Payer: Self-pay | Admitting: Internal Medicine

## 2022-09-24 VITALS — BP 150/70 | HR 63 | Wt 139.8 lb

## 2022-09-24 DIAGNOSIS — I110 Hypertensive heart disease with heart failure: Secondary | ICD-10-CM | POA: Diagnosis not present

## 2022-09-24 DIAGNOSIS — Z79899 Other long term (current) drug therapy: Secondary | ICD-10-CM | POA: Diagnosis not present

## 2022-09-24 DIAGNOSIS — Z7722 Contact with and (suspected) exposure to environmental tobacco smoke (acute) (chronic): Secondary | ICD-10-CM | POA: Diagnosis not present

## 2022-09-24 DIAGNOSIS — R0789 Other chest pain: Secondary | ICD-10-CM | POA: Diagnosis not present

## 2022-09-24 DIAGNOSIS — I509 Heart failure, unspecified: Secondary | ICD-10-CM | POA: Insufficient documentation

## 2022-09-24 DIAGNOSIS — I2510 Atherosclerotic heart disease of native coronary artery without angina pectoris: Secondary | ICD-10-CM | POA: Diagnosis not present

## 2022-09-24 DIAGNOSIS — K219 Gastro-esophageal reflux disease without esophagitis: Secondary | ICD-10-CM | POA: Diagnosis not present

## 2022-09-24 DIAGNOSIS — Z8249 Family history of ischemic heart disease and other diseases of the circulatory system: Secondary | ICD-10-CM | POA: Diagnosis not present

## 2022-09-24 DIAGNOSIS — I080 Rheumatic disorders of both mitral and aortic valves: Secondary | ICD-10-CM | POA: Diagnosis not present

## 2022-09-24 DIAGNOSIS — I10 Essential (primary) hypertension: Secondary | ICD-10-CM | POA: Diagnosis not present

## 2022-09-24 DIAGNOSIS — Z955 Presence of coronary angioplasty implant and graft: Secondary | ICD-10-CM | POA: Insufficient documentation

## 2022-09-24 DIAGNOSIS — M349 Systemic sclerosis, unspecified: Secondary | ICD-10-CM | POA: Diagnosis not present

## 2022-09-24 MED ORDER — LOSARTAN POTASSIUM 25 MG PO TABS: 25.0000 mg | ORAL_TABLET | Freq: Every day | ORAL | 3 refills | Status: DC

## 2022-09-24 NOTE — Patient Instructions (Signed)
START Losartan 25 mg daily.  Your physician has requested that you have an echocardiogram. Echocardiography is a painless test that uses sound waves to create images of your heart. It provides your doctor with information about the size and shape of your heart and how well your heart's chambers and valves are working. This procedure takes approximately one hour. There are no restrictions for this procedure. Please do NOT wear cologne, perfume, aftershave, or lotions (deodorant is allowed). Please arrive 15 minutes prior to your appointment time.  Your physician recommends that you schedule a follow-up appointment in: 1 year ( July 2025) ** please call the office in May 2025 to arrange your follow up appointment.**  If you have any questions or concerns before your next appointment please send [place] a message through [place] or call our office at [phone].    TO LEAVE A MESSAGE FOR THE NURSE SELECT OPTION 2, PLEASE LEAVE A MESSAGE INCLUDING: YOUR NAME DATE OF BIRTH CALL BACK NUMBER REASON FOR CALL**this is important as we prioritize the call backs  YOU WILL RECEIVE A CALL BACK THE SAME DAY AS LONG AS YOU CALL BEFORE 4:00 PM  At the [facility], you and your health needs are our priority. As part of our continuing mission to provide you with exceptional heart care, we have created designated Provider Care Teams. These Care Teams include your primary Cardiologist (physician) and Advanced Practice Providers (APPs- Physician Assistants and Nurse Practitioners) who all work together to provide you with the care you need, when you need it.   You may see any of the following providers on your designated Care Team at your next follow up: Dr Arvilla Meres Dr Marca Ancona Dr. Marcos Eke, NP Robbie Lis, [place] [facility] [place], [place] Brynda Peon, NP Karle Plumber, PharmD   Please be sure to bring in all your medications bottles to every  appointment.    Thank you for choosing [facility]

## 2022-09-24 NOTE — Progress Notes (Signed)
PCP: Dr. Leveda Anna Referring: Dr. Berenice Bouton Cardiologist: Dr. Gala Romney    HPI: Rhonda Brewer is a 72 y.o.woman with h/o scleroderma, CAD s/p stent 9/17 , SVT s/p ablation 2/05 referred by Dr. Titus Dubin for screening for [facility] in setting of scleroderma.   Previously lived in [place]. 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   In 9/17 had heart cath due to positive stress test. At time had exertional fatigue and dyspnea and arm tingling. No CP.    Cath 11/24/15 LM: mild irregs LAD: 20% mid LCX: mild irreg RCA: mRCA 99% ->Xience Alpine DES 3.5x54mm  Echo 11/22/15: LVEF 55-60% Mild AI. Normal RV. Mild TR.   Echo 08/27/16: EF 60-65%,  RV normal Mild AI. No RV strain or PAH.   PFTs 08/2016 FEV1 2.11 (88%) FVC 2.86 (91%) DLCO 63%  PFTs 12/20 FEV1 2.32 (99%) FVC 2.90 (94%) DLCO 85%  PFTs 12/22 FEV1 2.05L (90%) FVC 2.63L (87%) DLCO 76%  PFTs 6/23  FEV1 2.17 (95%) FVC 2.75 (91%) DLCO 82%  She was seen in the HF clinic for initial evaluation in June 2018. It was felt that her dyspnea could be due to Brilinta. She was switched to Plavix. Chest CT was ordered and showed no interstitial lung disease.   Cath 5/19 which showed very mild CAD. No evidence. There was a small step-up in saturations at RA level suggestive of anomalous pulmonary vein or shunt at atrial level   Mid RCA lesion is 20% stenosed. Prox LAD lesion is 40% stenosed.   Repeat hi-res CT on 09/07/21. No ILD.  3v coronary calcium  Acute visit 9/23 with CP. Arranged for cath-->R/LHC (9/23) stable CAD with patent RCA stent and 40% mLAD lesion. EF 60-65%, normal right heart pressures.  Completed cardio class at the gym, felt light-headed and "woozy". Went home and woke up Sat AM and felt SOB and anxious. Woke up Sunday, felt burning in chest and went to ED 09/22/22.  HsTroponin flat. ECG unremarkable.  Today she returns for post ED follow up. Overall feeling fine. Still feels  anxious about symptoms. Planning on going to [place] tomorrow morning. Trip was delayed due to her brother in law having a cardia arrest. She is anxious about her heart. She is not SOB. Very active, work out 5-6x/week (cardio and [employer] training). Occasional dizziness. Feels occasional palpitations during anxiety episodes. Denies further CP, edema, or PND/Orthopnea. Appetite ok. No fever or chills. Taking all medications. BP at home 140's.   Cardiac Studies:  - R/LHC (9/23): stable CAD, patent RCA stent and 40 % mLAD lesion  RA 3, PA 21/3 (11), PCWP 4, CO/CI (Fick) 5.4/3.2, PVR 1.3 WU  - Echo 2/23 : 55-60% mild MR mild to mod AI  RV normal Personally reviewed  - Echo 1/22 EF 55-60% mild MR, mild to mod AI Personally reviewed   - Cath 5/19  Ao = 137/66 (94) LV = 141/13 RA = 2 RV = 20/4 PA = 19/6 (12) PCW = 7 Fick cardiac output/index = 5.3/3.1 Thermo CO/CI = 3.6/2.2 PVR = 0.7 WU SVR 1402 Ao sat = 99% PA sat = 79%, 80% High SVC sat = 68% RA sat = 78% RV sat = 75% Qp/Qs = 1.11 Assessment:   1. CAD with patent RCA stent and mild non-obstructive CAD in LAD 2. Normal LVEF 60-65% 3. Normal right heart pressures with no evidence of PAH 4. Small step-up in saturations at RA level suggestive of  anomalous pulmonary vein or shunt at atrial level    Past Medical History:  Diagnosis Date   Allergy    Arthritis    CAD (coronary artery disease)    a. DES to RCA 11/2015   Cancer [facility])    Uterine 2008   Cataract    CHF (congestive heart failure) (HCC)    Heart murmur    Hyperlipidemia    Osteoporosis    Thyroid disease     Current Outpatient Medications  Medication Sig Dispense Refill   acetaminophen (TYLENOL) 500 MG tablet Take 500-1,000 mg by mouth every 6 (six) hours as needed (pain.).     albuterol (VENTOLIN HFA) 108 (90 Base) MCG/ACT inhaler Inhale 2 puffs into the lungs every 4 (four) hours as needed for wheezing or shortness of breath. 18 g 6   aspirin EC 81 MG  tablet Take 81 mg by mouth at bedtime.     atorvastatin (LIPITOR) 40 MG tablet Take 1 tablet (40 mg total) by mouth daily. 90 tablet 3   Calcium Carbonate-Vitamin D (CALCIUM-VITAMIN D3 PO) Take 1 tablet by mouth in the morning and at bedtime.     Cyanocobalamin (VITAMIN B-12 PO) Take 1 tablet by mouth in the morning.     cycloSPORINE (RESTASIS) 0.05 % ophthalmic emulsion Place 1 drop into both eyes 2 (two) times daily.     Evolocumab (REPATHA SURECLICK) 140 MG/ML SOAJ Inject 1 mL into the skin every 14 (fourteen) days. 6 mL 3   famotidine (PEPCID) 20 MG tablet TAKE 1 TABLET(20 MG) BY MOUTH AT BEDTIME 90 tablet 3   fexofenadine (ALLEGRA) 180 MG tablet Take 180 mg by mouth as needed for allergies or rhinitis.     levothyroxine (SYNTHROID) 75 MCG tablet Take 1 tablet (75 mcg total) by mouth daily before breakfast. 90 tablet 3   Multiple Vitamin (MULTI-VITAMINS) TABS Take 1 tablet by mouth in the morning.     pantoprazole (PROTONIX) 40 MG tablet TAKE 1 TABLET BY MOUTH DAILY 100 tablet 2   valACYclovir (VALTREX) 500 MG tablet Take 500 mg by mouth See admin instructions. Take 1 tablet (500 mg) by mouth twice daily x 5 day at onset of outbreak.     No current facility-administered medications for this encounter.     Allergies  Allergen Reactions   Avelox [Moxifloxacin Hcl In Nacl] Other (See Comments)    dizziness   Other Other (See Comments)    SEASONAL ALLERGIES     Social History   Socioeconomic History   Marital status: Married    Spouse name: Juliene Pina   Number of children: 2   Years of education: 12   Highest education level: 12th grade  Occupational History   Occupation: retired    Comment: Research scientist (medical)  Tobacco Use   Smoking status: Never    Passive exposure: Past   Smokeless tobacco: Never  Vaping Use   Vaping Use: Never used  Substance and Sexual Activity   Alcohol use: Yes    Comment: occasional wine   Drug use: No   Sexual activity: Yes    Birth  control/protection: Surgical    Comment: hysterectomy  Other Topics Concern   Not on file  Social History Narrative   Lives in home with husband. Daughters live out of state. Step-daughter in [place]. Has 8 grandchildren. Lives in 2 level home but most living is on first floor. Stairs have handrails, no problem with stairs. No grab bars in bathroom. No tripping hazards. Smoke alarms  present.   No pets.    Eats a good variety of foods, husband is a cook. Eats meats, fruits, vegetables. Drinks water mostly. Occasional tea.   Goes to gym 5 times a week. Enjoys reading, sewing, movies. Walking when weather is nice.    Wears seat belt in car.    Social Determinants of Health   Financial Resource Strain: Low Risk  (05/10/2022)   Overall Financial Resource Strain (CARDIA)    Difficulty of Paying Living Expenses: Not hard at all  Food Insecurity: No Food Insecurity (05/10/2022)   Hunger Vital Sign    Worried About Running Out of Food in the Last Year: Never true    Ran Out of Food in the Last Year: Never true  Transportation Needs: No Transportation Needs (05/10/2022)   PRAPARE - Administrator, Civil Service (Medical): No    Lack of Transportation (Non-Medical): No  Physical Activity: Sufficiently Active (05/10/2022)   Exercise Vital Sign    Days of Exercise per Week: 6 days    Minutes of Exercise per Session: 90 min  Stress: No Stress Concern Present (05/10/2022)   [employer] of Occupational Health - Occupational Stress Questionnaire    Feeling of Stress : Not at all  Social Connections: Moderately Integrated (05/10/2022)   Social Connection and Isolation Panel [NHANES]    Frequency of Communication with Friends and Family: More than three times a week    Frequency of Social Gatherings with Friends and Family: More than three times a week    Attends Religious Services: Never    Database administrator or Organizations: No    Attends Engineer, structural: More than 4  times per year    Marital Status: Married  Catering manager Violence: Not At Risk (05/10/2022)   Humiliation, Afraid, Rape, and Kick questionnaire    Fear of Current or Ex-Partner: No    Emotionally Abused: No    Physically Abused: No    Sexually Abused: No    Family History  Problem Relation Age of Onset   Asthma Maternal Aunt    Heart disease Mother    Diabetes Mother    Heart disease Father    Stomach cancer Brother    Diabetes Brother    Colon cancer Neg Hx    Esophageal cancer Neg Hx    Rectal cancer Neg Hx    BP (!) 150/70   Pulse 63   Wt 63.4 kg (139 lb 12.8 oz)   SpO2 99%   BMI 24.76 kg/m   Wt Readings from Last 3 Encounters:  09/24/22 63.4 kg (139 lb 12.8 oz)  09/22/22 65 kg (143 lb 4.8 oz)  07/23/22 65.1 kg (143 lb 9.6 oz)   PHYSICAL EXAM: General:  NAD. No resp difficulty HEENT: Normal Neck: Supple. No JVD. Carotids 2+ bilat; no bruits. No lymphadenopathy or thryomegaly appreciated. Cor: PMI nondisplaced. Regular rate & rhythm. No rubs, gallops or murmurs. Lungs: Clear Abdomen: Soft, nontender, nondistended. No hepatosplenomegaly. No bruits or masses. Good bowel sounds. Extremities: No cyanosis, clubbing, rash, edema Neuro: Alert & oriented x 3, cranial nerves grossly intact. Moves all 4 extremities w/o difficulty. Affect pleasant.  ECG (personally reviewed): SB 57 bpm, no acute ST-T changes  ReDs: 24%  ASSESSMENT & PLAN:  1. CAD  - s/p RCA stent in 11/2015 - cath 5/19 with mild non-obstructive CAD - Cath 9/23 with patent RCA stent, 40% stenosis pLAD - Recent CP, went to ED. HsTroponin negative. No exertional  dyspnea.  - Continue ASA - Continue atorvastatin + Repatha.  - LDL 24 (1/24), goal LDL < 70  2. Scleroderma - DLCO mildly reduced on PFTs 6/18. No R heart strain on echo. CT without ILD.  - RHC 5/19 no PAH - PFTs 12/20 with normalization of DLCO.  - No evidence of scleroderma-related lung disease (PAH or fibrosis).  - Echo and PFTs stable  6/23. No sign PAH - RHC 9/23 with normal right heart pressures with small step up between SVC and RA - Due for repeat echo  3. Hypertension - BP elevated - Typically 140's at home  4. Valvular disease - mild MR and mild-mod AI - stable on previous echo - update echo  5. GERD - Had EGD 4/24, has small hiatal hernia - On Pepcid + Pantoprazole 40 daily    Jacklynn Ganong, FNP  10:50 AM     Patient seen and examined with the above-signed Advanced Practice Provider and/or Housestaff. I personally reviewed laboratory data, imaging studies and relevant notes. I independently examined the patient and formulated the important aspects of the plan. I have edited the note to reflect any of my changes or salient points. I have personally discussed the plan with the patient and/or family.  Overall doing well. Very active without symptoms. Had transient episode of SOB/chest tightness over the weekend. Went to ER. W/u reassuring. Having GERD symptoms  SBP 140s at home. Planning trip to [place] tomorrow  General:  Well appearing. No resp difficulty HEENT: normal Neck: supple. no JVD. Carotids 2+ bilat; no bruits. No lymphadenopathy or thryomegaly appreciated. Cor: PMI nondisplaced. Regular rate & rhythm. No rubs, gallops or murmurs. Lungs: clear Abdomen: soft, nontender, nondistended. No hepatosplenomegaly. No bruits or masses. Good bowel sounds. Extremities: no cyanosis, clubbing, rash, edema Neuro: alert & orientedx3, cranial nerves grossly intact. moves all 4 extremities w/o difficulty. Affect pleasant  Doubt chest discomfort is cardiac. Recent cath and ED w/u reassuring. Needed repeat echo to reassess AI and surveil for PAH in setting of scleroderma. Continue PPI for GERD (related to scleroderma) - can go back to BID as needed. Can consider Zio montioring as needed.  Would start losartan for HTN on her return.   Arvilla Meres, MD  11:27 AM

## 2022-09-24 NOTE — Progress Notes (Signed)
ReDS Vest / Clip - 09/24/22 1100       ReDS Vest / Clip   Station Marker A    Ruler Value 28    ReDS Value Range Low volume    ReDS Actual Value 24

## 2022-09-24 NOTE — Addendum Note (Signed)
Encounter addended by: Linda Hedges, RN on: 09/24/2022 11:36 AM  Actions taken: Order list changed, Diagnosis association updated, Clinical Note Signed

## 2022-09-25 ENCOUNTER — Other Ambulatory Visit: Payer: Self-pay

## 2022-09-25 MED ORDER — FAMOTIDINE 20 MG PO TABS: ORAL_TABLET | ORAL | 3 refills | Status: DC

## 2022-09-25 NOTE — Telephone Encounter (Signed)
Famotidine refilled as [facility] requested. Patient up to date on her appointments.

## 2022-09-27 ENCOUNTER — Ambulatory Visit: Payer: Medicare Other | Admitting: Physician Assistant

## 2022-10-08 ENCOUNTER — Encounter: Payer: Self-pay | Admitting: Gastroenterology

## 2022-10-09 ENCOUNTER — Other Ambulatory Visit: Payer: Self-pay

## 2022-10-09 MED ORDER — DICYCLOMINE HCL 10 MG PO CAPS: 10.0000 mg | ORAL_CAPSULE | Freq: Three times a day (TID) | ORAL | 0 refills | Status: DC | PRN

## 2022-10-09 NOTE — Telephone Encounter (Signed)
Bowel habits can take a while after an episode of acute gastroenteritis to return to normal baseline.  Please add Benefiber 1 tablespoon 2-3 times with meals.  Okay to use Imodium as needed.   Beth, please send prescription for dicyclomine 10 mg 3 times daily as needed X 30 tablets and advise her to use as needed for next few weeks. Thank you

## 2022-10-28 ENCOUNTER — Ambulatory Visit (HOSPITAL_COMMUNITY)
Admission: RE | Admit: 2022-10-28 | Discharge: 2022-10-28 | Disposition: A | Discharge status: Home / Self Care | Payer: Medicare Other | Source: Ambulatory Visit | Attending: Family Medicine | Admitting: Family Medicine

## 2022-10-28 DIAGNOSIS — I509 Heart failure, unspecified: Secondary | ICD-10-CM | POA: Insufficient documentation

## 2022-10-28 DIAGNOSIS — Z859 Personal history of malignant neoplasm, unspecified: Secondary | ICD-10-CM | POA: Insufficient documentation

## 2022-10-28 DIAGNOSIS — I3139 Other pericardial effusion (noninflammatory): Secondary | ICD-10-CM | POA: Diagnosis not present

## 2022-10-28 DIAGNOSIS — M349 Systemic sclerosis, unspecified: Secondary | ICD-10-CM

## 2022-10-28 DIAGNOSIS — I110 Hypertensive heart disease with heart failure: Secondary | ICD-10-CM | POA: Insufficient documentation

## 2022-10-28 DIAGNOSIS — M341 CR(E)ST syndrome: Secondary | ICD-10-CM | POA: Insufficient documentation

## 2022-10-28 DIAGNOSIS — I2510 Atherosclerotic heart disease of native coronary artery without angina pectoris: Secondary | ICD-10-CM

## 2022-10-28 DIAGNOSIS — I351 Nonrheumatic aortic (valve) insufficiency: Secondary | ICD-10-CM | POA: Insufficient documentation

## 2022-10-28 DIAGNOSIS — E785 Hyperlipidemia, unspecified: Secondary | ICD-10-CM | POA: Insufficient documentation

## 2022-10-28 LAB — ECHOCARDIOGRAM COMPLETE
AR max vel: 2.31 cm2
AV Area VTI: 2.12 cm2
AV Area mean vel: 2.08 cm2
AV Mean grad: 5 mmHg
AV Peak grad: 9.4 mmHg
Ao pk vel: 1.53 m/s
Area-P 1/2: 4.74 cm2
Calc EF: 59.3 %
MV VTI: 3.31 cm2
P 1/2 time: 510 msec
S' Lateral: 3 cm
Single Plane A2C EF: 61.2 %
Single Plane A4C EF: 60.5 %

## 2022-12-08 ENCOUNTER — Encounter: Payer: Self-pay | Admitting: Family Medicine

## 2022-12-18 ENCOUNTER — Encounter: Payer: Self-pay | Admitting: Family Medicine

## 2022-12-18 NOTE — Telephone Encounter (Signed)
Patient is calling stating she is checking on a referral no one has returned her call. I updated her about our referral coordinator. Patient states anytime she has asked for a referral she has had it placed with in a day or so.   Please Advise.   Thanks!

## 2022-12-19 ENCOUNTER — Other Ambulatory Visit: Payer: Self-pay | Admitting: Internal Medicine

## 2022-12-19 ENCOUNTER — Ambulatory Visit (INDEPENDENT_AMBULATORY_CARE_PROVIDER_SITE_OTHER): Payer: Medicare Other | Admitting: Student

## 2022-12-19 VITALS — BP 112/65 | HR 64 | Ht 63.0 in | Wt 141.4 lb

## 2022-12-19 DIAGNOSIS — I2510 Atherosclerotic heart disease of native coronary artery without angina pectoris: Secondary | ICD-10-CM

## 2022-12-19 DIAGNOSIS — E785 Hyperlipidemia, unspecified: Secondary | ICD-10-CM

## 2022-12-19 DIAGNOSIS — S39012A Strain of muscle, fascia and tendon of lower back, initial encounter: Secondary | ICD-10-CM

## 2022-12-19 NOTE — Assessment & Plan Note (Signed)
Patient comes in for back pain that began about a week ago, while at the gym exercising.  Patient notes that she was stretching, heard a pop, and then developed sharp burning back pain.  Patient notes back pain has improved, since initially started, but she was concerned and wanted to know what could be going on.  Patient has a history of sciatica, treated with PT, but notes no pain radiating down her legs now.  Pain located in the lumbar region, paraspinal, appears to be muscular in nature, good range of motion, negative straight leg raise, negative changes in bowel and bladder habits.  Given that pain started suddenly, and is improving, most likely concern with lumbar strain.  Low concern for arthritis at this time, given that symptoms worse with any activity, improved with activity, steadily improving. Will recommend rest/ice/heat, and topical analgesia. -Topical analgesia (lidocaine patches, voltaren gel) -Rest/Ice/Heating pads -F/u 1-2 weeks if not better, may consider PT

## 2022-12-19 NOTE — Progress Notes (Signed)
  SUBJECTIVE:   CHIEF COMPLAINT / HPI:   Rt Sided Low Back Pain -Pt hurt back on Friday, called for refferal to [facility] on Monday, but was not connected   Last Friday she was at the gym and streching. During the strche hshe heard a pop and her back started hurting. It's now hurting, but better with walking, but laying or sitting to long hurt. Has a trip scheduled for [place] on Tuesday and want's to be sure she's okay. Pain was sharp and buringing on the right lower side of her back. She's tried heating pad, ice, and tyelnol. The first three days were bad, but it's gotten better since.   PERTINENT  PMH / PSH:    OBJECTIVE:  BP 112/65   Pulse 64   Ht 5\' 3"  (1.6 m)   Wt 141 lb 6.4 oz (64.1 kg)   SpO2 99%   BMI 25.05 kg/m  Physical Exam Constitutional:      General: She is not in acute distress.    Appearance: Normal appearance.  Musculoskeletal:     Cervical back: Normal.     Thoracic back: Normal.     Lumbar back: Tenderness present. No swelling, deformity, signs of trauma, lacerations, spasms or bony tenderness. Normal range of motion. Negative right straight leg raise test and negative left straight leg raise test. No scoliosis.  Neurological:     Mental Status: She is alert.  Psychiatric:        Mood and Affect: Mood normal.      ASSESSMENT/PLAN:  Strain of lumbar region, initial encounter Assessment & Plan: Patient comes in for back pain that began about a week ago, while at the gym exercising.  Patient notes that she was stretching, heard a pop, and then developed sharp burning back pain.  Patient notes back pain has improved, since initially started, but she was concerned and wanted to know what could be going on.  Patient has a history of sciatica, treated with PT, but notes no pain radiating down her legs now.  Pain located in the lumbar region, paraspinal, appears to be muscular in nature, good range of motion, negative straight leg raise, negative changes in bowel  and bladder habits.  Given that pain started suddenly, and is improving, most likely concern with lumbar strain.  Low concern for arthritis at this time, given that symptoms worse with any activity, improved with activity, steadily improving. Will recommend rest/ice/heat, and topical analgesia. -Topical analgesia (lidocaine patches, voltaren gel) -Rest/Ice/Heating pads -F/u 1-2 weeks if not better, may consider PT    No follow-ups on file. Bess Kinds, MD 12/19/2022, 9:53 AM PGY-3, [facility]

## 2022-12-19 NOTE — Patient Instructions (Addendum)
It was great to see you! Thank you for allowing me to participate in your care!  I recommend that you always bring your medications to each appointment as this makes it easy to ensure we are on the correct medications and helps [place] not miss when refills are needed.  Our plans for today:  - Lower Back Pain It seems like you may have strained a muscle. This will get better with rest, and warm heat pads.    Pain management: You can use Tylenol as needed for pain. You can also try lidocaine patches. They can be worn for 12 hours at a time. You can also use Voltaren gel when not using lidocaine patches.   Salonpas lidocaine patches, available over the counter in most pharmacies  Voltaren gel to be applied to area when not using lidocaine patches.         If not improving in 1-2 weeks, make a follow up appointment to be seen, we may need to consider Physical Therapy   Take care and seek immediate care sooner if you develop any concerns.   Dr. Bess Kinds, MD [facility]

## 2023-01-04 ENCOUNTER — Other Ambulatory Visit: Payer: Self-pay | Admitting: Internal Medicine

## 2023-01-04 DIAGNOSIS — I2510 Atherosclerotic heart disease of native coronary artery without angina pectoris: Secondary | ICD-10-CM

## 2023-01-06 ENCOUNTER — Encounter: Payer: Self-pay | Admitting: Family Medicine

## 2023-01-06 ENCOUNTER — Ambulatory Visit (INDEPENDENT_AMBULATORY_CARE_PROVIDER_SITE_OTHER): Payer: Medicare Other | Admitting: Family Medicine

## 2023-01-06 VITALS — BP 114/56 | HR 56 | Ht 63.0 in | Wt 141.4 lb

## 2023-01-06 DIAGNOSIS — Z0000 Encounter for general adult medical examination without abnormal findings: Secondary | ICD-10-CM

## 2023-01-06 DIAGNOSIS — M8589 Other specified disorders of bone density and structure, multiple sites: Secondary | ICD-10-CM

## 2023-01-06 DIAGNOSIS — M349 Systemic sclerosis, unspecified: Secondary | ICD-10-CM

## 2023-01-06 DIAGNOSIS — R195 Other fecal abnormalities: Secondary | ICD-10-CM

## 2023-01-06 DIAGNOSIS — I10 Essential (primary) hypertension: Secondary | ICD-10-CM | POA: Diagnosis not present

## 2023-01-06 DIAGNOSIS — I2510 Atherosclerotic heart disease of native coronary artery without angina pectoris: Secondary | ICD-10-CM

## 2023-01-06 DIAGNOSIS — M545 Low back pain, unspecified: Secondary | ICD-10-CM | POA: Diagnosis not present

## 2023-01-06 DIAGNOSIS — R932 Abnormal findings on diagnostic imaging of liver and biliary tract: Secondary | ICD-10-CM

## 2023-01-06 DIAGNOSIS — Z1211 Encounter for screening for malignant neoplasm of colon: Secondary | ICD-10-CM

## 2023-01-06 DIAGNOSIS — M341 CR(E)ST syndrome: Secondary | ICD-10-CM

## 2023-01-06 DIAGNOSIS — A6000 Herpesviral infection of urogenital system, unspecified: Secondary | ICD-10-CM

## 2023-01-06 DIAGNOSIS — E785 Hyperlipidemia, unspecified: Secondary | ICD-10-CM | POA: Diagnosis not present

## 2023-01-06 DIAGNOSIS — E039 Hypothyroidism, unspecified: Secondary | ICD-10-CM | POA: Diagnosis not present

## 2023-01-06 DIAGNOSIS — G8929 Other chronic pain: Secondary | ICD-10-CM

## 2023-01-06 NOTE — Patient Instructions (Signed)
It was great to see you!  Our plans for today:  - Get your shingles and tetanus vaccines from your pharmacy.  - You will get Cologuard mailed to your house. Follow the instructions for colon cancer screening.  - We will try to get your records from [facility] about your recent bone density scan.   We are checking some labs today, we will release these results to your MyChart.  Take care and seek immediate care sooner if you develop any concerns.   Dr. Linwood Dibbles   Preventive Care 65 Years and Older, Female Preventive care refers to lifestyle choices and visits with your health care provider that can promote health and wellness. Preventive care visits are also called wellness exams. What can I expect for my preventive care visit? Counseling Your health care provider may ask you questions about your: Medical history, including: Past medical problems. Family medical history. Pregnancy and menstrual history. History of falls. Current health, including: Memory and ability to understand (cognition). Emotional well-being. Home life and relationship well-being. Sexual activity and sexual health. Lifestyle, including: Alcohol, nicotine or tobacco, and drug use. Access to firearms. Diet, exercise, and sleep habits. Work and work Astronomer. Sunscreen use. Safety issues such as seatbelt and bike helmet use. Physical exam Your health care provider will check your: Height and weight. These may be used to calculate your BMI (body mass index). BMI is a measurement that tells if you are at a healthy weight. Waist circumference. This measures the distance around your waistline. This measurement also tells if you are at a healthy weight and may help predict your risk of certain diseases, such as type 2 diabetes and high blood pressure. Heart rate and blood pressure. Body temperature. Skin for abnormal spots. What immunizations do I need?  Vaccines are usually given at various ages,  according to a schedule. Your health care provider will recommend vaccines for you based on your age, medical history, and lifestyle or other factors, such as travel or where you work. What tests do I need? Screening Your health care provider may recommend screening tests for certain conditions. This may include: Lipid and cholesterol levels. Hepatitis C test. Hepatitis B test. HIV (human immunodeficiency virus) test. STI (sexually transmitted infection) testing, if you are at risk. Lung cancer screening. Colorectal cancer screening. Diabetes screening. This is done by checking your blood sugar (glucose) after you have not eaten for a while (fasting). Mammogram. Talk with your health care provider about how often you should have regular mammograms. BRCA-related cancer screening. This may be done if you have a family history of breast, ovarian, tubal, or peritoneal cancers. Bone density scan. This is done to screen for osteoporosis. Talk with your health care provider about your test results, treatment options, and if necessary, the need for more tests. Follow these instructions at home: Eating and drinking  Eat a diet that includes fresh fruits and vegetables, whole grains, lean protein, and low-fat dairy products. Limit your intake of foods with high amounts of sugar, saturated fats, and salt. Take vitamin and mineral supplements as recommended by your health care provider. Do not drink alcohol if your health care provider tells you not to drink. If you drink alcohol: Limit how much you have to 0-1 drink a day. Know how much alcohol is in your drink. In the [place] one drink equals one 12 oz bottle of beer (355 mL), one 5 oz glass of wine (148 mL), or one 1 oz glass of hard liquor (  44 mL). Lifestyle Brush your teeth every morning and night with fluoride toothpaste. Floss one time each day. Exercise for at least 30 minutes 5 or more days each week. Do not use any products that contain  nicotine or tobacco. These products include cigarettes, chewing tobacco, and vaping devices, such as e-cigarettes. If you need help quitting, ask your health care provider. Do not use drugs. If you are sexually active, practice safe sex. Use a condom or other form of protection in order to prevent STIs. Take aspirin only as told by your health care provider. Make sure that you understand how much to take and what form to take. Work with your health care provider to find out whether it is safe and beneficial for you to take aspirin daily. Ask your health care provider if you need to take a cholesterol-lowering medicine (statin). Find healthy ways to manage stress, such as: Meditation, yoga, or listening to music. Journaling. Talking to a trusted person. Spending time with friends and family. Minimize exposure to UV radiation to reduce your risk of skin cancer. Safety Always wear your seat belt while driving or riding in a vehicle. Do not drive: If you have been drinking alcohol. Do not ride with someone who has been drinking. When you are tired or distracted. While texting. If you have been using any mind-altering substances or drugs. Wear a helmet and other protective equipment during sports activities. If you have firearms in your house, make sure you follow all gun safety procedures. What's next? Visit your health care provider once a year for an annual wellness visit. Ask your health care provider how often you should have your eyes and teeth checked. Stay up to date on all vaccines. This information is not intended to replace advice given to you by your health care provider. Make sure you discuss any questions you have with your health care provider. Document Revised: 08/30/2020 Document Reviewed: 08/30/2020 Elsevier Patient Education  2024 [employer].

## 2023-01-06 NOTE — Progress Notes (Unsigned)
BP (!) 114/56   Pulse (!) 56   Ht 5\' 3"  (1.6 m)   Wt 141 lb 6.4 oz (64.1 kg)   SpO2 100%   BMI 25.05 kg/m    Subjective:    Patient ID: Rhonda Brewer, female    DOB: Dec 22, 1950, 72 y.o.   MRN: [id number]  HPI: Rhonda Brewer is a 72 y.o. female presenting on 01/06/2023 for comprehensive medical examination. Current medical complaints include:{Blank single:19197::"none","***"}  Hypertension, CAD, HLD: - Medications: losartan, lipitor, ASA - Compliance: good - Checking BP at home: yes, 140s SBP - Denies any SOB, CP, vision changes, LE edema, medication SEs, or symptoms of hypotension  Hypothyroidism - Medications: Synthroid *** - Current symptoms:  {$Symptoms; [phone]} - Denies {$Symptoms; [phone]} - Symptoms have {$Desc; symptom progression:[phone]}  She currently lives with: Menopausal Symptoms: {Blank single:19197::"yes","no"}  Depression Screen done today and results listed below:     12/19/2022    9:25 AM 05/10/2022    1:20 PM 12/31/2021    8:47 AM 08/07/2021    3:31 PM 12/07/2020    8:50 AM  Depression screen PHQ 2/9  Decreased Interest 0 0 0 0 0  Down, Depressed, Hopeless 0 0 0 0 1  PHQ - 2 Score 0 0 0 0 1  Altered sleeping 0  0 2 0  Tired, decreased energy 0  0 2 0  Change in appetite 0  0 0 0  Feeling bad or failure about yourself  0  0 0 0  Trouble concentrating 0  0 0 0  Moving slowly or fidgety/restless 0  0 0 0  Suicidal thoughts 0  0 0 0  PHQ-9 Score 0  0 4 1  Difficult doing work/chores    Not difficult at all Not difficult at all    The patient does not have a history of falls. I {did/did not:19850} complete a risk assessment for falls. A plan of care for falls {was/was not:19852} documented.   Past Medical History:  Past Medical History:  Diagnosis Date   Allergy    Arthritis    CAD (coronary artery disease)    a. DES to RCA 11/2015   Cancer [facility])    Uterine 2008   Cataract    CHF (congestive heart failure) (HCC)     Heart murmur    Hyperlipidemia    Osteoporosis    Thyroid disease     Surgical History:  Past Surgical History:  Procedure Laterality Date   ABDOMINAL HYSTERECTOMY     APPENDECTOMY     CARDIAC ELECTROPHYSIOLOGY MAPPING AND ABLATION  04/19/2003   CARPAL TUNNEL RELEASE Right 10/15/2017   CARPAL TUNNEL RELEASE Left 06/14/2022   CATARACT EXTRACTION Bilateral    07/2021 R 08/2021 L   CORONARY ANGIOPLASTY WITH STENT PLACEMENT     DE QUERVAIN'S RELEASE Left 06/14/2022   RIGHT/LEFT HEART CATH AND CORONARY ANGIOGRAPHY [name] 08/14/2017   Procedure: RIGHT/LEFT HEART CATH AND CORONARY ANGIOGRAPHY;  Surgeon: Dolores Patty, MD;  Location: [facility] INVASIVE CV LAB;  Service: Cardiovascular;  Laterality: [name];   RIGHT/LEFT HEART CATH AND CORONARY ANGIOGRAPHY [name] 11/20/2021   Procedure: RIGHT/LEFT HEART CATH AND CORONARY ANGIOGRAPHY;  Surgeon: Dolores Patty, MD;  Location: [facility] INVASIVE CV LAB;  Service: Cardiovascular;  Laterality: [name];   ULTRASOUND GUIDANCE FOR VASCULAR ACCESS  08/14/2017   Procedure: Ultrasound Guidance For Vascular Access;  Surgeon: Dolores Patty, MD;  Location: [facility] INVASIVE CV LAB;  Service: Cardiovascular;;   UPPER GI ENDOSCOPY  07/15/2022  Medications:  Current Outpatient Medications on File Prior to Visit  Medication Sig   acetaminophen (TYLENOL) 500 MG tablet Take 500-1,000 mg by mouth every 6 (six) hours as needed (pain.).   albuterol (VENTOLIN HFA) 108 (90 Base) MCG/ACT inhaler Inhale 2 puffs into the lungs every 4 (four) hours as needed for wheezing or shortness of breath.   aspirin EC 81 MG tablet Take 81 mg by mouth at bedtime.   atorvastatin (LIPITOR) 40 MG tablet TAKE 1 TABLET BY MOUTH DAILY   Calcium Carbonate-Vitamin D (CALCIUM-VITAMIN D3 PO) Take 1 tablet by mouth in the morning and at bedtime.   Cyanocobalamin (VITAMIN B-12 PO) Take 1 tablet by mouth in the morning.   cycloSPORINE (RESTASIS) 0.05 % ophthalmic emulsion Place 1 drop into both eyes 2  (two) times daily.   dicyclomine (BENTYL) 10 MG capsule Take 1 capsule (10 mg total) by mouth 3 (three) times daily as needed for spasms.   famotidine (PEPCID) 20 MG tablet TAKE 1 TABLET(20 MG) BY MOUTH AT BEDTIME   fexofenadine (ALLEGRA) 180 MG tablet Take 180 mg by mouth as needed for allergies or rhinitis.   levothyroxine (SYNTHROID) 75 MCG tablet Take 1 tablet (75 mcg total) by mouth daily before breakfast.   losartan (COZAAR) 25 MG tablet Take 1 tablet (25 mg total) by mouth daily.   Multiple Vitamin (MULTI-VITAMINS) TABS Take 1 tablet by mouth in the morning.   pantoprazole (PROTONIX) 40 MG tablet TAKE 1 TABLET BY MOUTH DAILY   REPATHA SURECLICK 140 MG/ML SOAJ INJECT 140MG  SUBCUTANEOUSLY  EVERY 2 WEEKS   valACYclovir (VALTREX) 500 MG tablet Take 500 mg by mouth See admin instructions. Take 1 tablet (500 mg) by mouth twice daily x 5 day at onset of outbreak.   No current facility-administered medications on file prior to visit.    Allergies:  Allergies  Allergen Reactions   Avelox [Moxifloxacin Hcl In Nacl] Other (See Comments)    dizziness   Other Other (See Comments)    SEASONAL ALLERGIES     Social History:  Social History   Socioeconomic History   Marital status: Married    Spouse name: Juliene Pina   Number of children: 2   Years of education: 12   Highest education level: Some college, no degree  Occupational History   Occupation: retired    Comment: Research scientist (medical)  Tobacco Use   Smoking status: Never    Passive exposure: Past   Smokeless tobacco: Never  Vaping Use   Vaping status: Never Used  Substance and Sexual Activity   Alcohol use: Yes    Comment: occasional wine   Drug use: No   Sexual activity: Yes    Birth control/protection: Surgical    Comment: hysterectomy  Other Topics Concern   Not on file  Social History Narrative   Lives in home with husband. Daughters live out of state. Step-daughter in [place]. Has 8 grandchildren. Lives in 2 level home but  most living is on first floor. Stairs have handrails, no problem with stairs. No grab bars in bathroom. No tripping hazards. Smoke alarms present.   No pets.    Eats a good variety of foods, husband is a cook. Eats meats, fruits, vegetables. Drinks water mostly. Occasional tea.   Goes to gym 5 times a week. Enjoys reading, sewing, movies. Walking when weather is nice.    Wears seat belt in car.    Social Determinants of Health   Financial Resource Strain: Low Risk  (01/02/2023)  Overall Financial Resource Strain (CARDIA)    Difficulty of Paying Living Expenses: Not hard at all  Food Insecurity: No Food Insecurity (01/02/2023)   Hunger Vital Sign    Worried About Running Out of Food in the Last Year: Never true    Ran Out of Food in the Last Year: Never true  Transportation Needs: No Transportation Needs (01/02/2023)   PRAPARE - Administrator, Civil Service (Medical): No    Lack of Transportation (Non-Medical): No  Physical Activity: Sufficiently Active (01/02/2023)   Exercise Vital Sign    Days of Exercise per Week: 6 days    Minutes of Exercise per Session: 60 min  Stress: No Stress Concern Present (01/02/2023)   [employer] of Occupational Health - Occupational Stress Questionnaire    Feeling of Stress : Not at all  Social Connections: Unknown (01/02/2023)   Social Connection and Isolation Panel [NHANES]    Frequency of Communication with Friends and Family: More than three times a week    Frequency of Social Gatherings with Friends and Family: More than three times a week    Attends Religious Services: Patient declined    Database administrator or Organizations: Yes    Attends Engineer, structural: More than 4 times per year    Marital Status: Married  Catering manager Violence: Not At Risk (05/10/2022)   Humiliation, Afraid, Rape, and Kick questionnaire    Fear of Current or Ex-Partner: No    Emotionally Abused: No    Physically Abused: No     Sexually Abused: No   Social History   Tobacco Use  Smoking Status Never   Passive exposure: Past  Smokeless Tobacco Never   Social History   Substance and Sexual Activity  Alcohol Use Yes   Comment: occasional wine    Family History:  Family History  Problem Relation Age of Onset   Asthma Maternal Aunt    Heart disease Mother    Diabetes Mother    Heart disease Father    Stomach cancer Brother    Diabetes Brother    Colon cancer Neg Hx    Esophageal cancer Neg Hx    Rectal cancer Neg Hx     Past medical history, surgical history, medications, allergies, family history and social history reviewed with patient today and changes made to appropriate areas of the chart.      Objective:    BP (!) 114/56   Pulse (!) 56   Ht 5\' 3"  (1.6 m)   Wt 141 lb 6.4 oz (64.1 kg)   SpO2 100%   BMI 25.05 kg/m   Wt Readings from Last 3 Encounters:  01/06/23 141 lb 6.4 oz (64.1 kg)  12/19/22 141 lb 6.4 oz (64.1 kg)  09/24/22 139 lb 12.8 oz (63.4 kg)    Physical Exam  Results for orders placed or performed during the hospital encounter of 10/28/22  ECHOCARDIOGRAM COMPLETE  Result Value Ref Range   S' Lateral 3.00 cm   AV Area VTI 2.12 cm2   AV Mean grad 5.0 mmHg   Single Plane A4C EF 60.5 %   Single Plane A2C EF 61.2 %   Calc EF 59.3 %   AV Area mean vel 2.08 cm2   Area-P 1/2 4.74 cm2   AR max vel 2.31 cm2   AV Peak grad 9.4 mmHg   Ao pk vel 1.53 m/s   P 1/2 time 510 msec   MV VTI 3.31 cm2  Est EF 60 - 65%       Assessment & Plan:   Problem List Items Addressed This Visit   None    Follow up plan: No follow-ups on file.   LABORATORY TESTING:  - Pap smear: not applicable  IMMUNIZATIONS:   - Tdap: Tetanus vaccination status reviewed: {tetanus status:315746}. - Influenza: Up to date - Pneumovax: {Blank single:19197::"Up to date","Administered today","Not applicable","Refused","Given elsewhere"} - Prevnar: {Blank single:19197::"Up to date","Administered  today","Not applicable","Refused","Given elsewhere"} - HPV: Not applicable - Shingrix vaccine: {Blank single:19197::"Up to date","Administered today","Not applicable","Refused","Given elsewhere"} - COVID vaccine: UTD  SCREENING: - Mammogram: Up to date  - Colonoscopy: {Blank single:19197::"Up to date","Ordered today","Not applicable","Refused","Done elsewhere"}  - Bone Density: {Blank single:19197::"Up to date","Ordered today","Not applicable","Refused","Done elsewhere"} [facility]. - Lung Cancer Screening: {Blank single:19197::"Up to date","Ordered today","Not applicable","Refused","Done elsewhere"}   Hep C Screening: UTD STD testing and prevention (HIV/chl/gon/syphilis): UTD Sexual History : Menstrual History/LMP/Abnormal Bleeding:  Incontinence Symptoms:   Osteoporosis: Discussed high calcium and vitamin D supplementation, weight bearing exercises  Advanced Care Planning: A voluntary discussion about advance care planning including the explanation and discussion of advance directives.  Discussed health care proxy and Living will, and the patient was able to identify a health care proxy as husband, Homero Fellers.  Patient does have a living will at present time. If patient does have living will, I have requested they bring this to the clinic to be scanned in to their chart.  PATIENT COUNSELING:   Advised to take 1 mg of folate supplement per day if capable of pregnancy.   Sexuality: Discussed sexually transmitted diseases, partner selection, use of condoms, avoidance of unintended pregnancy  and contraceptive alternatives.   Advised to avoid cigarette smoking.  I discussed with the patient that most people either abstain from alcohol or drink within safe limits (<=14/week and <=4 drinks/occasion for males, <=7/weeks and <= 3 drinks/occasion for females) and that the risk for alcohol disorders and other health effects rises proportionally with the number of drinks per week and how  often a drinker exceeds daily limits.  Discussed cessation/primary prevention of drug use and availability of treatment for abuse.   Diet: Encouraged to adjust caloric intake to maintain  or achieve ideal body weight, to reduce intake of dietary saturated fat and total fat, to limit sodium intake by avoiding high sodium foods and not adding table salt, and to maintain adequate dietary potassium and calcium preferably from fresh fruits, vegetables, and low-fat dairy products.    stressed the importance of regular exercise  Injury prevention: Discussed safety belts, safety helmets, smoke detector, smoking near bedding or upholstery.   Dental health: Discussed importance of regular tooth brushing, flossing, and dental visits.    NEXT PREVENTATIVE PHYSICAL DUE IN 1 YEAR. No follow-ups on file.

## 2023-01-07 ENCOUNTER — Encounter: Payer: Self-pay | Admitting: Family Medicine

## 2023-01-07 ENCOUNTER — Telehealth: Payer: Self-pay

## 2023-01-07 LAB — TSH: TSH: 5.36 u[IU]/mL — ABNORMAL HIGH (ref 0.450–4.500)

## 2023-01-07 NOTE — Assessment & Plan Note (Signed)
Tolerant of statin, continue. 

## 2023-01-07 NOTE — Addendum Note (Signed)
Addended by: Caro Laroche on: 01/07/2023 01:53 PM   Modules accepted: Orders

## 2023-01-07 NOTE — Telephone Encounter (Signed)
Spoke to [place] and she will use the previous referral and just redirect it.  Glennie Hawk, CMA

## 2023-01-07 NOTE — Assessment & Plan Note (Signed)
[place] currently. Requests PT referral, ordered.

## 2023-01-07 NOTE — Assessment & Plan Note (Signed)
Continue to follow with Rheum

## 2023-01-07 NOTE — Assessment & Plan Note (Signed)
Recheck TSH today.  

## 2023-01-07 NOTE — Assessment & Plan Note (Addendum)
With high probability of normal results on elastography 08/2021. Normal LFTs 07/2022. Increased echogenicity ?likely 2/2 possible infiltration from known scleroderma/autoimmune process. Recommend continuing to follow with Rheum. Continue to monitor LFTs.

## 2023-01-07 NOTE — Telephone Encounter (Signed)
Called patient to inform her that she will need to make an appointment in 6 weeks for a TSH draw.  Patient states that she will call and make appointment.  Patient would also like an A1C at the lab viist. She states that she has not had one in about two years.  Patient wants a referral to [facility] on [address].  States that she requested one before but was given something else.  She "has been going to [place] for many years and would prefer to stay there."  .Glennie Hawk, CMA

## 2023-01-07 NOTE — Assessment & Plan Note (Signed)
Low normal today and without orthostatic sx, no changes today. Continue to monitor. Reviewed recent labs.

## 2023-01-07 NOTE — Assessment & Plan Note (Signed)
Asymptomatic currently. Continue prn valtrex.

## 2023-01-08 DIAGNOSIS — D2371 Other benign neoplasm of skin of right lower limb, including hip: Secondary | ICD-10-CM | POA: Diagnosis not present

## 2023-01-08 DIAGNOSIS — L821 Other seborrheic keratosis: Secondary | ICD-10-CM | POA: Diagnosis not present

## 2023-01-08 DIAGNOSIS — L819 Disorder of pigmentation, unspecified: Secondary | ICD-10-CM | POA: Diagnosis not present

## 2023-01-08 NOTE — Telephone Encounter (Signed)
Referral has been faxed to [facility]. If  needed their phone number is [phone].  Melvenia Beam

## 2023-01-13 DIAGNOSIS — M5441 Lumbago with sciatica, right side: Secondary | ICD-10-CM | POA: Diagnosis not present

## 2023-01-13 DIAGNOSIS — G8929 Other chronic pain: Secondary | ICD-10-CM | POA: Diagnosis not present

## 2023-01-14 DIAGNOSIS — Z1211 Encounter for screening for malignant neoplasm of colon: Secondary | ICD-10-CM | POA: Diagnosis not present

## 2023-01-16 ENCOUNTER — Ambulatory Visit: Payer: Medicare Other | Admitting: Rehabilitative and Restorative Service Providers"

## 2023-01-17 ENCOUNTER — Other Ambulatory Visit: Payer: Self-pay | Admitting: Internal Medicine

## 2023-01-17 DIAGNOSIS — M349 Systemic sclerosis, unspecified: Secondary | ICD-10-CM

## 2023-01-20 NOTE — Patient Instructions (Incomplete)
ICD-10-CM   1. Dyspnea and respiratory abnormalities  R06.00    R06.89   2. Scleroderma (HCC)  M34.9     No ILD on CT June 2023 - some bland scaring but risk for ILD remains - some < 10% over next few years  Noormal PFT  CT suggests possible "cirrhossi" liver  Plan - - do spirometry and dlco in 12 months  - echo folloowup with cardiology  -PCP [name], MD to discuss liver finding with you - given contact of Lupita Leash the support group leader - noted you might be interested in clinical trials in future  followup - 12 months with  Dr Marchelle Gearing 15 min slot but after PFT  - symptom socre and walk test at next visit

## 2023-01-20 NOTE — Progress Notes (Unsigned)
PCP Rhonda Manners, MD   HPI  IOV 08/16/2016  Chief Complaint  Patient presents with   Advice Only    Referred by Dr. Corliss Brewer for scleroderma.  c/o worsening sob, chest tightness with exertion X1 month.    HPI 72 year old woman with history of scleroderma on hydroxychloroquine, CAD s/p stent 11/2015, uterine cancer dx in 2008 s/p hysterectomy, chemo/radtx presenting for evaluation of dyspnea on exertion.  She is followed by Dr. Titus Brewer for rheumatology, Dr. Gala Brewer for cardiology. Previously was followed by Dr. Robb Brewer in [place]. Last saw Dr. Corliss Brewer 06/19/2016. She has had scleroderma for 10 years and only has been on hydroxychloroquine. She moved to [facility] in January 2018.  For the past month, she has dyspnea with going up one flight of stairs most times. This is new. Also sometimes gets short of breath with bending over to pick something up. Denies cough. She does yoga, strength training, and walking for exercise. She can walk a couple of miles without any issues. She is independent in all her activities of daily living. Does not use any devices to aid in walking. Denies GERD. Has not had a pulmonologist.  She had shortness of breath that prompted the cardiac evaluation leading to stent placement. She has an appointment with cardiology June 12. Review of Systems  S: See resident physician for details. She has scleroderma for over 10 years for which she is on Actonel. Denies any associated acid reflux. Started on was believed only to involve the skin. I personally evaluated the history that this patient had a few months of shortness of breath in 2017 between summer and fall that then resulted in worsening dyspnea on exertion. That then resulted in a cardiac stent. After this dyspnea resolved. Then subsequently in January 2018 moved from the [place] area to [place], [place]. Now for the last 1 month she's having recurrent dyspnea on exertion. She feels this is from  the heart. She does not think is a lung issue. Relieved by rest. She notices it for climbing stairs relieved by rest. She has cardiology appointment pending. There is a pulmonary function test and echocardiogram pending on 08/27/2016. She is reluctant to get a CT chest. Walking desat test in office 185 feet x  3 laps on RA: 100% at rest and exertion  Recent pertinent labs  Results for Rhonda, Brewer (MRN [id number]) as of 08/16/2016 10:29  Ref. Range 06/19/2016 10:38 06/26/2016 10:04  Creatinine Latest Ref Range: 0.50 - 0.99 mg/dL 7.82   Results for Rhonda, Brewer (MRN [id number]) as of 08/16/2016 10:29  Ref. Range 06/19/2016 10:38 06/26/2016 10:04  Hemoglobin Latest Ref Range: 11.7 - 15.5 g/dL 57.8      has a past medical history of Cancer (HCC).   reports that she has never smoked. She has never used smokeless tobacco.   OV 11/05/2016  Chief Complaint  Patient presents with   Follow-up    Pt here after CT and PFT. Pt denies change in SOB since last OV. Pt denies cough, CP/tightness, f/c/s.     Follow-up scleroderma with associated shortness of breath   last seen in June 2018. At that time he thought the suspicion for interstitial lung disease was low. She had Pulm  function test that showed isolated reduction in diffusion capacity to 63%. This raises the possibility of interstitial lung disease but she did have a high-resolution CT scan of the chest that is documented below. Interstitial lung disease has been ruled out. This  no pulmonary parenchymal abnormality. Her hemoglobin was 14.8 g percent suggesting no anemia causing shortness of breath. She then followed up with cardiology. According to the echocardiogram she does not have pulmonary hypertension which can be seen and started on the patient's. She was on  BRILINTA for CAD - this got changed to Plavix and her dyspnea resolved. Currently she is doing well does not have any rest symptoms.   Results for Rhonda, Brewer (MRN [id number]) as of  11/05/2016 11:24  Ref. Range 08/27/2016 11:19  FVC-Pre Latest Units: L 2.86  FVC-%Pred-Pre Latest Units: % 91  FEV1-Pre Latest Units: L 2.11  FEV1-%Pred-Pre Latest Units: % 88  Pre FEV1/FVC ratio Latest Units: % 74    Results for Rhonda, Brewer (MRN [id number]) as of 11/05/2016 11:24  Ref. Range 08/27/2016 11:19  TLC Latest Units: L 4.64  TLC % pred Latest Units: % 91  Results for Rhonda, Brewer (MRN [id number]) as of 11/05/2016 11:24  Ref. Range 08/27/2016 11:19  DLCO unc Latest Units: ml/min/mmHg 15.42  DLCO unc % pred Latest Units: % 63   IMPRESSION: 1. No evidence of interstitial lung disease. No acute pulmonary disease . 2. Solitary 3 mm solid apical right upper lobe pulmonary nodule. No follow-up needed if patient is low-risk. Non-contrast chest CT can be considered in 12 months if patient is high-risk. This recommendation follows the consensus statement: Guidelines for Management of Incidental Pulmonary Nodules Detected on CT Images: From the Fleischner Society 2017; Radiology 2017; 284:228-243. 3. Small pericardial effusion/thickening. 4. Three-vessel coronary atherosclerosis.   Aortic Atherosclerosis (ICD10-I70.0).     Electronically Signed   By: Rhonda Brewer M.D.   On: 09/03/2016 15:18  OV 12/16/2017  Subjective:  Patient ID: Rhonda Brewer, female , DOB: May 25, 1950 , age 72 y.o. , MRN: [id number] , ADDRESS: [address] [place] [facility] [address]   12/16/2017 -   Chief Complaint  Patient presents with   Follow-up    Pt is here for a 1 year follow up and states she has been doing well. Pt denies any complaints.     HPI Rhonda Brewer 72 y.o. -1 year follow-up for ILD monitoring in the setting of scleroderma.  She says overall she is stable.  She is no longer on Plaquenil for scleroderma.  She continues to have Raynaud.  She tells me other than extremely mild shortness of breath IV exertion she is stable.  There is no interim medical issues or surgical issues.  No change in  medications of significance.  No ER visits no hospitalizations.  Only interim medical issues carpal tunnel surgery.  She had a CT scan of the chest July 2019.  This is not a high-resolution CT chest.  On the report there is no evidence of ILD.  Last echocardiogram June 2018 without any pulmonary hypertension.  There is no wheezing cough orthopnea proximal nocturnal dyspnea.    OV 08/10/2020  Subjective:  Patient ID: Rhonda Brewer, female , DOB: 12-25-1950 , age 32 y.o. , MRN: [id number] , ADDRESS: [address] [place] [facility] [address] PCP Rhonda Manners, MD Patient Care Team: Rhonda Manners, MD as PCP - General (Family Medicine) Bensimhon, Bevelyn Buckles, MD as PCP - Advanced Heart Failure (Cardiology) Pollyann Savoy, MD as Consulting Physician (Rheumatology) Bensimhon, Bevelyn Buckles, MD as Consulting Physician (Cardiology) Napoleon Form, MD as Consulting Physician (Gastroenterology) Kalman Shan, MD as Consulting Physician (Pulmonary Disease) Sallye Lat, MD as Consulting Physician (Ophthalmology) Elon Spanner Madelaine Etienne, MD as Consulting Physician (Obstetrics and Gynecology)  This Provider for this visit: Treatment Team:  Attending Provider: Kalman Shan, MD    08/10/2020 -   Chief Complaint  Patient presents with   Follow-up    Pt states she has been doing okay since last visit. Denies any issues with her breathing.   Follow-up scleroderma with very mild shortness of breath.  At risk for ILD.  - 2019 CT without ILD -2020 pulmonary function test with improvement/normal  HPI Rhonda Brewer 72 y.o. -returns for follow-up.  I personally saw her in 2019.  Then after that because of the pandemic I did not see her.  She did see nurse practitioner December 2020.  Pulmonary function test was normal//slightly improved/stable.  She is on expectant follow-up with monitoring.  In April 2022 she is a Dr. D her rheumatologist.  She asked to be established back in pulmonary to  see me.  Patient has continued ongoing issues with sclerodactyly and also Raynaud's.  In January 2022 she had echocardiogram that I visualized the result.  It shows valvular regurgitation is worse but no evidence of pulmonary hypertension.  She has very mild shortness of breath if at all for climbing stairs.  Symptom scores are not detailed below.  Walking desaturation test is stable compared to 3 years ago and normal.   PFT Subjective:  Patient ID: Rhonda Brewer, female , DOB: [birth year] , age 32 y.o. , MRN: [id number] , ADDRESS: [address] [place] [facility] [address] PCP Rhonda Manners, MD Patient Care Team: Rhonda Manners, MD as PCP - General (Family Medicine) Bensimhon, Bevelyn Buckles, MD as PCP - Advanced Heart Failure (Cardiology) Pollyann Savoy, MD as Consulting Physician (Rheumatology) Bensimhon, Bevelyn Buckles, MD as Consulting Physician (Cardiology) Napoleon Form, MD as Consulting Physician (Gastroenterology) Kalman Shan, MD as Consulting Physician (Pulmonary Disease) Sallye Lat, MD as Consulting Physician (Ophthalmology) Elon Spanner Madelaine Etienne, MD as Consulting Physician (Obstetrics and Gynecology)  This Provider for this visit: Treatment Team:  Attending Provider: Kalman Shan, MD    03/01/2021 -   Chief Complaint  Patient presents with   Follow-up    PFT performed today.  Pt states she has been doing okay since last visit and denies any complaints.   Follow-up scleroderma with very mild shortness of breath.  At risk for ILD.  - 2019 CT without ILD -2020 pulmonary function test with improvement/normal  HPI Rhonda Brewer 72 y.o. -returns for follow-up.  In the last 6 months she reports no change in her shortness of breath.  She admits to shortness of breath for stairs relieved by rest.  Last visit 2 she had the same thing but she scored 0 but she actually tells me she has shortness of breath with stairs relieved by rest.  There is no associated chest  pain.  She says this is stable without any worsening.  No cough.  She had pulmonary function test today it shows a slight drop in DLCO below 80%.  The first time there is a drop this low.  I did share these results with her.  Last CT scan of the chest was in 2019.  She is willing to have another CT scan of the chest  She is on pulmonary hypertension monitoring through Dr. Gala Brewer.  Next echocardiogram was in March 2023 according to history.    CT Chest data  No results found.  OV 09/07/2021  Subjective:  Patient ID: Rhonda Brewer, female , DOB: 1950-05-09 , age 28 y.o. , MRN: [id number] , ADDRESS: [phone]  [place] [address] PCP Leveda Anna Santiago Bumpers, MD Patient Care Team: Rhonda Manners, MD as PCP - General (Family Medicine) Bensimhon, Bevelyn Buckles, MD as PCP - Advanced Heart Failure (Cardiology) Pollyann Savoy, MD as Consulting Physician (Rheumatology) Bensimhon, Bevelyn Buckles, MD as Consulting Physician (Cardiology) Napoleon Form, MD as Consulting Physician (Gastroenterology) Kalman Shan, MD as Consulting Physician (Pulmonary Disease) Sallye Lat, MD as Consulting Physician (Ophthalmology) Elon Spanner Madelaine Etienne, MD as Consulting Physician (Obstetrics and Gynecology)  This Provider for this visit: Treatment Team:  Attending Provider: Kalman Shan, MD    09/07/2021 -   Chief Complaint  Patient presents with   Follow-up    PFT performed today.  Pt states she has been doing okay since last visit and denies any complaints.   Follow-up scleroderma at risk for ILD.  Normal pulmonary function test.  HPI Rhonda Brewer 72 y.o. -returns for follow-up.  Last visit there was concern that the pulmonary function test was changing.  So we repeated it.  Brought in 20-month interval.  The test is actually normal.  High-resolution CT chest shows that there is no ILD.  She just has planned scarring.  She continues to feel well.  Very minimal to no symptoms.   Echocardiogram is being monitored by cardiology.  She has no new complaints.  No changes to health status.  She is content with the level of monitoring.  We briefly discussed possible enrollment in clinical trials: She might be interested We also discussed joining support group: We gave her the number for Lupita Leash the support group leader.  Of note CT scan shows possible cirrhosis of the liver.  Personally visualized the images of the pulmonary function test and CT scan.     OV 01/21/2023  Subjective:  Patient ID: Rhonda Brewer, female , DOB: Mar 21, 1950 , age 20 y.o. , MRN: [id number] , ADDRESS: [address] [place] [facility] [address] PCP Caro Laroche, DO Patient Care Team: Caro Laroche, DO as PCP - General (Family Medicine) Bensimhon, Bevelyn Buckles, MD as PCP - Advanced Heart Failure (Cardiology) Pollyann Savoy, MD as Consulting Physician (Rheumatology) Bensimhon, Bevelyn Buckles, MD as Consulting Physician (Cardiology) Napoleon Form, MD as Consulting Physician (Gastroenterology) Kalman Shan, MD as Consulting Physician (Pulmonary Disease) Sallye Lat, MD as Consulting Physician (Ophthalmology) Elon Spanner Madelaine Etienne, MD as Consulting Physician (Obstetrics and Gynecology)  This Provider for this visit: Treatment Team:  Attending Provider: Kalman Shan, MD    01/21/2023 -   Chief Complaint  Patient presents with   Follow-up    Pft f/u   Scleroderma without ILD but mild bland scarring on surveillance  HPI Rhonda Brewer 72 y.o. -returns for follow-up.  Last seen in June 2023.  She tells me that since then she has had insidious onset of shortness of breath.  Present walking stairs.  It was always mild but now it is more noticeable.  She is also having mild chronic dry cough at night there is no wheezing.  This cough is also more noticeable.  Definitely symptoms are progressive in the last 6 months.  Symptom score below clearly shows that the symptoms were absent  in 2022 and now present and more prominent.  She had an echocardiogram in August 2024 and it is normal.  She had pulmonary function test today that I reviewed and it shows a decline FVC is now 2.5 L / 86% and DLCO is now 13.98/72%.  There is a change.  I visualized the previous CT scan and agreed with  the previous findings.  I did indicate to her that I am concerned about potential ILD developing.  She also has seasonal allergies we will check a CBC with differential blood IgE She indicated to me she has family Struve diabetes and wants her hemoglobin A1c checked.     SYMPTOM SCALE  08/10/2020  03/01/2021  01/21/2023   O2 use ra ra ra  Shortness of Breath 0 -> 5 scale with 5 being worst (score 6 If unable to do)    At rest 0 0 0  Simple tasks - showers, clothes change, eating, shaving 0 0 0  Household (dishes, doing bed, laundry) 000 0 1  Shopping 0 0 0  Walking level at own pace 0 0 0  Walking up Stairs 0 1 2  Total (30-36) Dyspnea Score 00 1 3  How bad is your cough? 0 0 2  How bad is your fatigue 0 0 0  How bad is nausea 00 0 0  How bad is vomiting?  0 0 0  How bad is diarrhea? 0 0 0  How bad is anxiety? 0 0 0  How bad is depression 0 0 0  0  nd.   Simple office walk 185 feet x  3 laps goal with forehead probe 12/16/2017  08/10/2020  01/21/2023   O2 used Room air ra ra  Number laps completed 3 3 Sit stand x 15  Comments about pace Normal brisk avg space   Resting Pulse Ox/HR 99% and 65/min 98% and HR 67 96% and HR 70  Final Pulse Ox/HR 99% and 74/min 98% and HR 82 95% and HR 77  Desaturated </= 88% no no   Desaturated <= 3% points no no   Got Tachycardic >/= 90/min no no   Symptoms at end of test non3 No complaints   Miscellaneous comments Normal test       PFT     Latest Ref Rng & Units 09/07/2021   11:39 AM 03/01/2021    2:50 PM 03/03/2019    2:00 PM 08/27/2016   11:19 AM  ILD indicators  FVC-Pre L 2.75  2.63  2.90  2.86   FVC-Predicted Pre % 91  87  94  91    FVC-Post L 2.69    2.98   FVC-Predicted Post % 89    94   TLC L 4.91    4.64   TLC Predicted % 97    91   DLCO uncorrected ml/min/mmHg 16.20  14.84  16.78  15.42   DLCO UNC %Pred % 83  76  85  63   DLCO Corrected ml/min/mmHg 16.10  14.84     DLCO COR %Pred % 82  76         LAB RESULTS last 96 hours No results found.   ECHO aug 2024   FINDINGS   Left Ventricle: Left ventricular ejection fraction, by estimation, is 60  to 65%. The left ventricle has normal function. The left ventricle has no  regional wall motion abnormalities. The left ventricular internal cavity  size was normal in size. There is   no left ventricular hypertrophy. Left ventricular diastolic parameters  were normal.   Right Ventricle: The right ventricular size is normal. No increase in  right ventricular wall thickness. Right ventricular systolic function is  normal.   Left Atrium: Left atrial size was normal in size.   Right Atrium: Right atrial size was normal in size.   Pericardium: A small pericardial  effusion is present. The pericardial  effusion is anterior to the right ventricle and localized near the right  atrium.   Mitral Valve: The mitral valve is grossly normal. No evidence of mitral  valve regurgitation. No evidence of mitral valve stenosis. MV peak  gradient, 1.8 mmHg. The mean mitral valve gradient is 1.0 mmHg.   Tricuspid Valve: The tricuspid valve is normal in structure. Tricuspid  valve regurgitation is mild . No evidence of tricuspid stenosis.   Aortic Valve: The aortic valve is tricuspid. Aortic valve regurgitation is  mild. Aortic regurgitation PHT measures 510 msec. No aortic stenosis is  present. Aortic valve mean gradient measures 5.0 mmHg. Aortic valve peak  gradient measures 9.4 mmHg. Aortic   valve area, by VTI measures 2.12 cm.   Pulmonic Valve: The pulmonic valve was normal in structure. Pulmonic valve  regurgitation is mild. No evidence of pulmonic stenosis.    Aorta: The aortic root and ascending aorta are structurally normal, with  no evidence of dilitation.   Venous: The inferior vena cava is dilated in size with greater than 50%  respiratory variability, suggesting right atrial pressure of 8 mmHg.   IAS/Shunts: No atrial level shunt detected by color flow Doppler.      has a past medical history of Allergy, Arthritis, CAD (coronary artery disease), Cancer (HCC), Cataract, CHF (congestive heart failure) (HCC), Heart murmur, Hyperlipidemia, Osteoporosis, and Thyroid disease.   reports that she has never smoked. She has been exposed to tobacco smoke. She has never used smokeless tobacco.  Past Surgical History:  Procedure Laterality Date   ABDOMINAL HYSTERECTOMY     APPENDECTOMY     CARDIAC ELECTROPHYSIOLOGY MAPPING AND ABLATION  04/19/2003   CARPAL TUNNEL RELEASE Right 10/15/2017   CARPAL TUNNEL RELEASE Left 06/14/2022   CATARACT EXTRACTION Bilateral    07/2021 R 08/2021 L   CORONARY ANGIOPLASTY WITH STENT PLACEMENT     DE QUERVAIN'S RELEASE Left 06/14/2022   RIGHT/LEFT HEART CATH AND CORONARY ANGIOGRAPHY [name] 08/14/2017   Procedure: RIGHT/LEFT HEART CATH AND CORONARY ANGIOGRAPHY;  Surgeon: Dolores Patty, MD;  Location: [facility] INVASIVE CV LAB;  Service: Cardiovascular;  Laterality: [name];   RIGHT/LEFT HEART CATH AND CORONARY ANGIOGRAPHY [name] 11/20/2021   Procedure: RIGHT/LEFT HEART CATH AND CORONARY ANGIOGRAPHY;  Surgeon: Dolores Patty, MD;  Location: [facility] INVASIVE CV LAB;  Service: Cardiovascular;  Laterality: [name];   ULTRASOUND GUIDANCE FOR VASCULAR ACCESS  08/14/2017   Procedure: Ultrasound Guidance For Vascular Access;  Surgeon: Dolores Patty, MD;  Location: [facility] INVASIVE CV LAB;  Service: Cardiovascular;;   UPPER GI ENDOSCOPY  07/15/2022    Allergies  Allergen Reactions   Avelox [Moxifloxacin Hcl In Nacl] Other (See Comments)    dizziness   Other Other (See Comments)    SEASONAL ALLERGIES     Immunization History   Administered Date(s) Administered   Fluad Quad(high Dose 65+) 11/29/2019, 12/07/2020, 12/31/2021   Influenza Split 01/17/2016   Influenza, Seasonal, Injecte, Preservative Fre 02/20/2012   Influenza,inj,Quad PF,6+ Mos 11/12/2018   Influenza-Unspecified 04/02/2017, 12/08/2017, 12/05/2022   Moderna Covid-19 Vaccine Bivalent Booster 68yrs & up 01/31/2021   Moderna SARS-COV2 Booster Vaccination 01/13/2020, 09/25/2020   Moderna Sars-Covid-2 Vaccination 04/09/2019, 05/14/2019   Pneumococcal Conjugate-13 10/26/2014   Pneumococcal Polysaccharide-23 11/12/2018   Pneumococcal-Unspecified 09/14/2013   Td 07/18/2011   Unspecified SARS-COV-2 Vaccination 02/19/2022, 12/05/2022    Family History  Problem Relation Age of Onset   Asthma Maternal Aunt    Heart disease Mother    Diabetes  Mother    Heart disease Father    Stomach cancer Brother    Diabetes Brother    Colon cancer Neg Hx    Esophageal cancer Neg Hx    Rectal cancer Neg Hx      Current Outpatient Medications:    acetaminophen (TYLENOL) 500 MG tablet, Take 500-1,000 mg by mouth every 6 (six) hours as needed (pain.)., Disp: , Rfl:    albuterol (VENTOLIN HFA) 108 (90 Base) MCG/ACT inhaler, Inhale 2 puffs into the lungs every 4 (four) hours as needed for wheezing or shortness of breath., Disp: 18 g, Rfl: 6   aspirin EC 81 MG tablet, Take 81 mg by mouth at bedtime., Disp: , Rfl:    atorvastatin (LIPITOR) 40 MG tablet, TAKE 1 TABLET BY MOUTH DAILY, Disp: 100 tablet, Rfl: 2   Calcium Carbonate-Vitamin D (CALCIUM-VITAMIN D3 PO), Take 1 tablet by mouth in the morning and at bedtime., Disp: , Rfl:    Cyanocobalamin (VITAMIN B-12 PO), Take 1 tablet by mouth in the morning., Disp: , Rfl:    cycloSPORINE (RESTASIS) 0.05 % ophthalmic emulsion, Place 1 drop into both eyes 2 (two) times daily., Disp: , Rfl:    dicyclomine (BENTYL) 10 MG capsule, Take 1 capsule (10 mg total) by mouth 3 (three) times daily as needed for spasms., Disp: 30 capsule,  Rfl: 0   famotidine (PEPCID) 20 MG tablet, TAKE 1 TABLET(20 MG) BY MOUTH AT BEDTIME, Disp: 90 tablet, Rfl: 3   fexofenadine (ALLEGRA) 180 MG tablet, Take 180 mg by mouth as needed for allergies or rhinitis., Disp: , Rfl:    levothyroxine (SYNTHROID) 75 MCG tablet, Take 1 tablet (75 mcg total) by mouth daily before breakfast., Disp: 90 tablet, Rfl: 3   Multiple Vitamin (MULTI-VITAMINS) TABS, Take 1 tablet by mouth in the morning., Disp: , Rfl:    pantoprazole (PROTONIX) 40 MG tablet, TAKE 1 TABLET BY MOUTH DAILY, Disp: 100 tablet, Rfl: 2   REPATHA SURECLICK 140 MG/ML SOAJ, INJECT 140MG  SUBCUTANEOUSLY  EVERY 2 WEEKS, Disp: 6 mL, Rfl: 3   valACYclovir (VALTREX) 500 MG tablet, Take 500 mg by mouth See admin instructions. Take 1 tablet (500 mg) by mouth twice daily x 5 day at onset of outbreak., Disp: , Rfl:    losartan (COZAAR) 25 MG tablet, Take 1 tablet (25 mg total) by mouth daily., Disp: 90 tablet, Rfl: 3      Objective:   Vitals:   01/21/23 1131  BP: 122/76  Pulse: 62  SpO2: 96%  Weight: 145 lb (65.8 kg)  Height: 5\' 4"  (1.626 m)    Estimated body mass index is 24.89 kg/m as calculated from the following:   Height as of this encounter: 5\' 4"  (1.626 m).   Weight as of this encounter: 145 lb (65.8 kg).  @WEIGHTCHANGE @  [employer]   01/21/23 1131  Weight: 145 lb (65.8 kg)     Physical Exam   General: No distress. Loook well O2 at rest: no Cane present: no Sitting in wheel chair: no Frail: no Obese: no Neuro: Alert and Oriented x 3. GCS 15. Speech normal Psych: Pleasant Resp:  Barrel Chest - no.  Wheeze - no, Crackles - no, No overt respiratory distress CVS: Normal heart sounds. Murmurs - no Ext: Stigmata of Connective Tissue Disease - RAYNAUD HEENT: Normal upper airway. PEERL +. No post nasal drip        Assessment:       ICD-10-CM   1. Dyspnea and respiratory abnormalities  R06.00 CBC with Differential   R06.89 IgE    B Nat Peptide    2. Chronic cough   R05.3     3. Scleroderma (HCC)  M34.9 CT Chest High Resolution    CBC with Differential    IgE    4. Family history of diabetes mellitus (DM)  Z83.3 Hemoglobin A1c         Plan:     Patient Instructions     ICD-10-CM   1. Dyspnea and respiratory abnormalities  R06.00    R06.89     2. Chronic cough  R05.3     3. Scleroderma (HCC)  M34.9     4. Family history of diabetes mellitus (DM)  Z83.3        No ILD on CT June 2023 but now having shortness of breath with exertion relieved by rest and some mild cough for the last 1 year and potentially worse Breathing test potentially changed  Nov  2024   Plan -Do high-resolution CT chest supine and prone inspiratory and expiratory volume in the next few to several weeks -Do blood work for CBC differential and blood IgE -Do blood work for BNP - check blood HgbA1C at your request  followup -4-8 weeks video visit with Dr. Marchelle Gearing to discuss the CT results and blood work results   FOLLOWUP Return in about 5 weeks (around 02/25/2023) for VIDEO VISIT, 15 min visit, with Dr Marchelle Gearing.    SIGNATURE    Dr. Kalman Shan, M.D., F.C.C.P,  Pulmonary and Critical Care Medicine Staff Physician, [facility] Director - Interstitial Lung Disease  Program  Pulmonary Fibrosis [facility] at [facility] [place], [place], [address]  Pager: [phone], If no answer or between  15:00h - 7:00h: call [phone] Telephone: [phone]  12:10 PM 01/21/2023

## 2023-01-20 NOTE — Progress Notes (Unsigned)
Office Visit Note  Patient: Rhonda Brewer             Date of Birth: [birth year]           MRN: [id number]             PCP: Caro Laroche, DO Referring: Doreene Eland, MD Visit Date: 01/23/2023 Occupation: @GUAROCC @  Subjective:  No chief complaint on file.   History of Present Illness: Rhonda Brewer is a 72 y.o. female ***     Activities of Daily Living:  Patient reports morning stiffness for *** {minute/hour:19697}.   Patient {ACTIONS;DENIES/REPORTS:21021675::"Denies"} nocturnal pain.  Difficulty dressing/grooming: {ACTIONS;DENIES/REPORTS:21021675::"Denies"} Difficulty climbing stairs: {ACTIONS;DENIES/REPORTS:21021675::"Denies"} Difficulty getting out of chair: {ACTIONS;DENIES/REPORTS:21021675::"Denies"} Difficulty using hands for taps, buttons, cutlery, and/or writing: {ACTIONS;DENIES/REPORTS:21021675::"Denies"}  No Rheumatology ROS completed.   PMFS History:  Patient Active Problem List   Diagnosis Date Noted  . Strain of lumbar region 12/19/2022  . Abnormal liver CT 09/04/2021  . Family history of diabetes mellitus (DM) 12/07/2020  . De Quervain's tenosynovitis, left 12/07/2020  . Genital herpes simplex 09/13/2020  . Chronic interstitial cystitis 04/09/2019  . Heart disease 01/08/2019  . Hypertensive disorder 01/08/2019  . Hypothyroidism 01/08/2019  . Hyperlipidemia 01/08/2019  . Acquired trigger finger of right ring finger 04/27/2018  . Carpal tunnel syndrome 05/29/2017  . Low back pain without sciatica 04/29/2017  . H/O cardiac radiofrequency ablation 06/26/2016  . CAD (coronary artery disease), native coronary artery 06/19/2016  . History of uterine cancer 06/19/2016  . Scleroderma (HCC) 06/16/2016  . Osteopenia of multiple sites 06/16/2016  . Raynaud's phenomenon 09/22/2012  . Arthritis 09/22/2012  . CREST syndrome (HCC) 04/08/2011    Past Medical History:  Diagnosis Date  . Allergy   . Arthritis   . CAD (coronary artery disease)    a. DES  to RCA 11/2015  . Cancer [facility])    Uterine 2008  . Cataract   . CHF (congestive heart failure) (HCC)   . Heart murmur   . Hyperlipidemia   . Osteoporosis   . Thyroid disease     Family History  Problem Relation Age of Onset  . Asthma Maternal Aunt   . Heart disease Mother   . Diabetes Mother   . Heart disease Father   . Stomach cancer Brother   . Diabetes Brother   . Colon cancer Neg Hx   . Esophageal cancer Neg Hx   . Rectal cancer Neg Hx    Past Surgical History:  Procedure Laterality Date  . ABDOMINAL HYSTERECTOMY    . APPENDECTOMY    . CARDIAC ELECTROPHYSIOLOGY MAPPING AND ABLATION  04/19/2003  . CARPAL TUNNEL RELEASE Right 10/15/2017  . CARPAL TUNNEL RELEASE Left 06/14/2022  . CATARACT EXTRACTION Bilateral    07/2021 R 08/2021 L  . CORONARY ANGIOPLASTY WITH STENT PLACEMENT    . DE QUERVAIN'S RELEASE Left 06/14/2022  . RIGHT/LEFT HEART CATH AND CORONARY ANGIOGRAPHY [name] 08/14/2017   Procedure: RIGHT/LEFT HEART CATH AND CORONARY ANGIOGRAPHY;  Surgeon: Dolores Patty, MD;  [facility];  Service: Cardiovascular;  Laterality: [name];  . RIGHT/LEFT HEART CATH AND CORONARY ANGIOGRAPHY [name] 11/20/2021   Procedure: RIGHT/LEFT HEART CATH AND CORONARY ANGIOGRAPHY;  Surgeon: Dolores Patty, MD;  Location: [facility] INVASIVE CV LAB;  Service: Cardiovascular;  Laterality: [name];  . ULTRASOUND GUIDANCE FOR VASCULAR ACCESS  08/14/2017   Procedure: Ultrasound Guidance For Vascular Access;  Surgeon: Dolores Patty, MD;  Location: [facility] INVASIVE CV LAB;  Service: Cardiovascular;;  .  UPPER GI ENDOSCOPY  07/15/2022   Social History   Social History Narrative   Lives in home with husband. Daughters live out of state. Step-daughter in [place]. Has 8 grandchildren. Lives in 2 level home but most living is on first floor. Stairs have handrails, no problem with stairs. No grab bars in bathroom. No tripping hazards. Smoke alarms present.   No pets.    Eats a good variety of foods,  husband is a cook. Eats meats, fruits, vegetables. Drinks water mostly. Occasional tea.   Goes to gym 5 times a week. Enjoys reading, sewing, movies. Walking when weather is nice.    Wears seat belt in car.    Immunization History  Administered Date(s) Administered  . Fluad Quad(high Dose 65+) 11/29/2019, 12/07/2020, 12/31/2021  . Influenza Split 01/17/2016  . Influenza,inj,Quad PF,6+ Mos 11/12/2018  . Influenza-Unspecified 04/02/2017, 12/08/2017, 12/05/2022  . Moderna Covid-19 Vaccine Bivalent Booster 51yrs & up 01/31/2021  . Moderna SARS-COV2 Booster Vaccination 01/13/2020, 09/25/2020  . Moderna Sars-Covid-2 Vaccination 04/09/2019, 05/14/2019  . Pneumococcal Conjugate-13 10/26/2014  . Pneumococcal Polysaccharide-23 11/12/2018  . Pneumococcal-Unspecified 09/14/2013  . Td 07/18/2011  . Unspecified SARS-COV-2 Vaccination 02/19/2022, 12/05/2022     Objective: Vital Signs: There were no vitals taken for this visit.   Physical Exam   Musculoskeletal Exam: ***  CDAI Exam: CDAI Score: -- Patient Global: --; Provider Global: -- Swollen: --; Tender: -- Joint Exam 01/23/2023   No joint exam has been documented for this visit   There is currently no information documented on the homunculus. Go to the Rheumatology activity and complete the homunculus joint exam.  Investigation: No additional findings.  Imaging: No results found.  Recent Labs: Lab Results  Component Value Date   WBC 5.8 09/22/2022   HGB 14.1 09/22/2022   PLT 234 09/22/2022   NA 139 09/22/2022   K 4.3 09/22/2022   CL 105 09/22/2022   CO2 23 09/22/2022   GLUCOSE 93 09/22/2022   BUN 13 09/22/2022   CREATININE 0.79 09/22/2022   BILITOT 0.4 07/23/2022   ALKPHOS 68 10/03/2021   AST 18 07/23/2022   ALT 12 07/23/2022   PROT 6.4 07/23/2022   ALBUMIN 4.1 10/03/2021   CALCIUM 9.4 09/22/2022   GFRAA 78 07/11/2020    Speciality Comments: PLQ Eye Exam: 08/07/17 PLQ toxcity,  PLQ discotinued @ [facility]  Procedures:  No procedures performed Allergies: Avelox [moxifloxacin hcl in nacl] and Other   Assessment / Plan:     Visit Diagnoses: No diagnosis found.  Orders: No orders of the defined types were placed in this encounter.  No orders of the defined types were placed in this encounter.   Face-to-face time spent with patient was *** minutes. Greater than 50% of time was spent in counseling and coordination of care.  Follow-Up Instructions: No follow-ups on file.   Ellen Henri, CMA  Note - This record has been created using Animal nutritionist.  Chart creation errors have been sought, but may not always  have been located. Such creation errors do not reflect on  the standard of medical care.

## 2023-01-21 ENCOUNTER — Encounter: Payer: Self-pay | Admitting: Internal Medicine

## 2023-01-21 ENCOUNTER — Ambulatory Visit: Payer: Medicare Other | Admitting: Internal Medicine

## 2023-01-21 ENCOUNTER — Other Ambulatory Visit (INDEPENDENT_AMBULATORY_CARE_PROVIDER_SITE_OTHER): Payer: Medicare Other

## 2023-01-21 VITALS — BP 122/76 | HR 62 | Ht 64.0 in | Wt 145.0 lb

## 2023-01-21 DIAGNOSIS — R0600 Dyspnea, unspecified: Secondary | ICD-10-CM

## 2023-01-21 DIAGNOSIS — M349 Systemic sclerosis, unspecified: Secondary | ICD-10-CM

## 2023-01-21 DIAGNOSIS — R053 Chronic cough: Secondary | ICD-10-CM

## 2023-01-21 DIAGNOSIS — Z833 Family history of diabetes mellitus: Secondary | ICD-10-CM

## 2023-01-21 DIAGNOSIS — R0689 Other abnormalities of breathing: Secondary | ICD-10-CM

## 2023-01-21 LAB — CBC WITH DIFFERENTIAL/PLATELET
Basophils Absolute: 0 10*3/uL (ref 0.0–0.1)
Basophils Relative: 0.5 % (ref 0.0–3.0)
Eosinophils Absolute: 0.2 10*3/uL (ref 0.0–0.7)
Eosinophils Relative: 2.3 % (ref 0.0–5.0)
HCT: 42.4 % (ref 36.0–46.0)
Hemoglobin: 13.9 g/dL (ref 12.0–15.0)
Lymphocytes Relative: 36 % (ref 12.0–46.0)
Lymphs Abs: 2.5 10*3/uL (ref 0.7–4.0)
MCHC: 32.7 g/dL (ref 30.0–36.0)
MCV: 96.2 fL (ref 78.0–100.0)
Monocytes Absolute: 0.5 10*3/uL (ref 0.1–1.0)
Monocytes Relative: 7.6 % (ref 3.0–12.0)
Neutro Abs: 3.7 10*3/uL (ref 1.4–7.7)
Neutrophils Relative %: 53.6 % (ref 43.0–77.0)
Platelets: 225 10*3/uL (ref 150.0–400.0)
RBC: 4.41 Mil/uL (ref 3.87–5.11)
RDW: 13.6 % (ref 11.5–15.5)
WBC: 6.9 10*3/uL (ref 4.0–10.5)

## 2023-01-21 LAB — HEMOGLOBIN A1C: Hgb A1c MFr Bld: 5.8 % (ref 4.6–6.5)

## 2023-01-21 LAB — BRAIN NATRIURETIC PEPTIDE: Pro B Natriuretic peptide (BNP): 106 pg/mL — ABNORMAL HIGH (ref 0.0–100.0)

## 2023-01-21 LAB — COLOGUARD: COLOGUARD: POSITIVE — AB

## 2023-01-21 NOTE — Patient Instructions (Signed)
Spirometry/DLCO performed today. 

## 2023-01-21 NOTE — Progress Notes (Signed)
Spirometry/DLCO performed today. 

## 2023-01-21 NOTE — Addendum Note (Signed)
Addended by: Caro Laroche on: 01/21/2023 08:53 PM   Modules accepted: Orders

## 2023-01-22 ENCOUNTER — Ambulatory Visit (AMBULATORY_SURGERY_CENTER): Payer: Medicare Other | Admitting: *Deleted

## 2023-01-22 DIAGNOSIS — Z1211 Encounter for screening for malignant neoplasm of colon: Secondary | ICD-10-CM

## 2023-01-22 LAB — IGE: IgE (Immunoglobulin E), Serum: 45 kU/L (ref <=114)

## 2023-01-22 MED ORDER — NA SULFATE-K SULFATE-MG SULF 17.5-3.13-1.6 GM/177ML PO SOLN: 1.0000 | Freq: Once | ORAL | 0 refills | Status: AC

## 2023-01-22 NOTE — Progress Notes (Signed)
Pt's name and DOB verified at the beginning of the pre-visit wit 2 identifiers  Pt denies any difficulty with ambulating,sitting, laying down or rolling side to side  Gave both [facility] main # and MD on call # prior to instructions.   No egg or soy allergy known to patient   No issues known to pt with past sedation with any surgeries or procedures  Patient denies ever being intubated  Pt has no issues moving head neck or swallowing  No FH of Malignant Hyperthermia  Pt is not on diet pills or shots  Pt is not on home 02   Pt is not on blood thinners   Pt has frequent issues with constipation RN instructed pt to use Miralax per bottles instructions a week before prep days. Pt states they will  Pt is not on dialysis  Pt hx of SVT but has had an ablation in 2007 no issue since per pt  Pt denies any upcoming cardiac testing  Pt encouraged to use to use Singlecare or Goodrx to reduce cost   Patient's chart reviewed by Cathlyn Parsons CNRA prior to pre-visit and patient appropriate for the [facility].  Pre-visit completed and red dot placed by patient's name on their procedure day (on provider's schedule).  .  Visit by phone  Pt states weight is 144 lb  Instructed pt why it is important to and  to call if they have any changes in health or new medications. Directed them to the # given and on instructions.     Instructions reviewed with pt and pt states understanding. Instructed to review again prior to procedure. Pt states they will.   Instructions sent by mail with coupon and by my chart

## 2023-01-23 ENCOUNTER — Ambulatory Visit: Payer: Medicare Other | Attending: Rheumatology | Admitting: Rheumatology

## 2023-01-23 ENCOUNTER — Encounter: Payer: Self-pay | Admitting: Rheumatology

## 2023-01-23 VITALS — BP 127/83 | HR 64 | Resp 12 | Ht 64.0 in | Wt 141.8 lb

## 2023-01-23 DIAGNOSIS — M8589 Other specified disorders of bone density and structure, multiple sites: Secondary | ICD-10-CM | POA: Diagnosis not present

## 2023-01-23 DIAGNOSIS — Z79899 Other long term (current) drug therapy: Secondary | ICD-10-CM

## 2023-01-23 DIAGNOSIS — I7300 Raynaud's syndrome without gangrene: Secondary | ICD-10-CM | POA: Diagnosis not present

## 2023-01-23 DIAGNOSIS — Z9889 Other specified postprocedural states: Secondary | ICD-10-CM

## 2023-01-23 DIAGNOSIS — M349 Systemic sclerosis, unspecified: Secondary | ICD-10-CM

## 2023-01-23 DIAGNOSIS — E785 Hyperlipidemia, unspecified: Secondary | ICD-10-CM | POA: Diagnosis not present

## 2023-01-23 DIAGNOSIS — Z8719 Personal history of other diseases of the digestive system: Secondary | ICD-10-CM

## 2023-01-23 DIAGNOSIS — Z8639 Personal history of other endocrine, nutritional and metabolic disease: Secondary | ICD-10-CM

## 2023-01-23 DIAGNOSIS — Z8542 Personal history of malignant neoplasm of other parts of uterus: Secondary | ICD-10-CM

## 2023-01-23 DIAGNOSIS — Z8679 Personal history of other diseases of the circulatory system: Secondary | ICD-10-CM | POA: Diagnosis not present

## 2023-01-23 DIAGNOSIS — M25562 Pain in left knee: Secondary | ICD-10-CM

## 2023-01-23 DIAGNOSIS — G8929 Other chronic pain: Secondary | ICD-10-CM

## 2023-01-24 ENCOUNTER — Other Ambulatory Visit: Payer: Self-pay

## 2023-01-24 ENCOUNTER — Telehealth: Payer: Self-pay

## 2023-01-24 NOTE — Telephone Encounter (Signed)
Received call from pharmacy regarding levothyroxine.   There has been a manufacturer switch from [employer] to [employer]. They need provider okay before dispensing.   Will forward to PCP.   If change is appropriate, I can call back to pharmacy to provide approval.   Ref number: [id number]  Veronda Prude, RN

## 2023-01-27 ENCOUNTER — Encounter: Payer: Self-pay | Admitting: Family Medicine

## 2023-01-27 ENCOUNTER — Telehealth: Payer: Self-pay | Admitting: Family Medicine

## 2023-01-27 DIAGNOSIS — M341 CR(E)ST syndrome: Secondary | ICD-10-CM

## 2023-01-27 NOTE — Telephone Encounter (Signed)
Called pharmacy and provided with verbal okay.   Veronda Prude, RN

## 2023-01-27 NOTE — Telephone Encounter (Signed)
Future lab  Ordered CMP per patient request for Rheumatology. Lab ordered as future.  Terisa Starr, MD  Family Medicine Teaching Service

## 2023-01-30 ENCOUNTER — Encounter: Payer: Self-pay | Admitting: Gastroenterology

## 2023-02-12 ENCOUNTER — Inpatient Hospital Stay
Admission: RE | Admit: 2023-02-12 | Discharge: 2023-02-12 | Disposition: A | Discharge status: Home / Self Care | Payer: Medicare Other | Source: Ambulatory Visit | Attending: Internal Medicine | Admitting: Internal Medicine

## 2023-02-12 DIAGNOSIS — R0602 Shortness of breath: Secondary | ICD-10-CM | POA: Diagnosis not present

## 2023-02-12 DIAGNOSIS — I2510 Atherosclerotic heart disease of native coronary artery without angina pectoris: Secondary | ICD-10-CM | POA: Diagnosis not present

## 2023-02-12 DIAGNOSIS — J984 Other disorders of lung: Secondary | ICD-10-CM | POA: Diagnosis not present

## 2023-02-12 DIAGNOSIS — M349 Systemic sclerosis, unspecified: Secondary | ICD-10-CM

## 2023-02-12 DIAGNOSIS — I700 Atherosclerosis of aorta: Secondary | ICD-10-CM | POA: Diagnosis not present

## 2023-02-18 ENCOUNTER — Ambulatory Visit (AMBULATORY_SURGERY_CENTER): Payer: Medicare Other | Admitting: Gastroenterology

## 2023-02-18 ENCOUNTER — Encounter: Payer: Self-pay | Admitting: Gastroenterology

## 2023-02-18 VITALS — BP 109/58 | HR 72 | Temp 97.7°F | Resp 20 | Ht 64.0 in | Wt 144.0 lb

## 2023-02-18 DIAGNOSIS — D122 Benign neoplasm of ascending colon: Secondary | ICD-10-CM | POA: Diagnosis not present

## 2023-02-18 DIAGNOSIS — R195 Other fecal abnormalities: Secondary | ICD-10-CM

## 2023-02-18 DIAGNOSIS — K6289 Other specified diseases of anus and rectum: Secondary | ICD-10-CM | POA: Diagnosis not present

## 2023-02-18 DIAGNOSIS — K648 Other hemorrhoids: Secondary | ICD-10-CM

## 2023-02-18 DIAGNOSIS — D123 Benign neoplasm of transverse colon: Secondary | ICD-10-CM

## 2023-02-18 DIAGNOSIS — K644 Residual hemorrhoidal skin tags: Secondary | ICD-10-CM | POA: Diagnosis not present

## 2023-02-18 DIAGNOSIS — Z1211 Encounter for screening for malignant neoplasm of colon: Secondary | ICD-10-CM

## 2023-02-18 DIAGNOSIS — E785 Hyperlipidemia, unspecified: Secondary | ICD-10-CM | POA: Diagnosis not present

## 2023-02-18 DIAGNOSIS — I509 Heart failure, unspecified: Secondary | ICD-10-CM | POA: Diagnosis not present

## 2023-02-18 DIAGNOSIS — I2510 Atherosclerotic heart disease of native coronary artery without angina pectoris: Secondary | ICD-10-CM | POA: Diagnosis not present

## 2023-02-18 MED ORDER — SODIUM CHLORIDE 0.9 % IV SOLN: 500.0000 mL | Freq: Once | INTRAVENOUS | Status: DC

## 2023-02-18 NOTE — Patient Instructions (Signed)
Handouts provided on polyps and hemorrhoids.   Resume previous diet. Continue present medications.  Await pathology results.  Repeat colonoscopy in 3 years for surveillance based on pathology results.   YOU HAD AN ENDOSCOPIC PROCEDURE TODAY AT [facility]:   Refer to the procedure report that was given to you for any specific questions about what was found during the examination.  If the procedure report does not answer your questions, please call your gastroenterologist to clarify.  If you requested that your care partner not be given the details of your procedure findings, then the procedure report has been included in a sealed envelope for you to review at your convenience later.  YOU SHOULD EXPECT: Some feelings of bloating in the abdomen. Passage of more gas than usual.  Walking can help get rid of the air that was put into your GI tract during the procedure and reduce the bloating. If you had a lower endoscopy (such as a colonoscopy or flexible sigmoidoscopy) you may notice spotting of blood in your stool or on the toilet paper. If you underwent a bowel prep for your procedure, you may not have a normal bowel movement for a few days.  Please Note:  You might notice some irritation and congestion in your nose or some drainage.  This is from the oxygen used during your procedure.  There is no need for concern and it should clear up in a day or so.  SYMPTOMS TO REPORT IMMEDIATELY:  Following lower endoscopy (colonoscopy or flexible sigmoidoscopy):  Excessive amounts of blood in the stool  Significant tenderness or worsening of abdominal pains  Swelling of the abdomen that is new, acute  Fever of 100F or higher  For urgent or emergent issues, a gastroenterologist can be reached at any hour by calling [phone]. Do not use MyChart messaging for urgent concerns.    DIET:  We do recommend a small meal at first, but then you may proceed to your regular diet.  Drink plenty  of fluids but you should avoid alcoholic beverages for 24 hours.  ACTIVITY:  You should plan to take it easy for the rest of today and you should NOT DRIVE or use heavy machinery until tomorrow (because of the sedation medicines used during the test).    FOLLOW UP: Our staff will call the number listed on your records the next business day following your procedure.  We will call around 7:15- 8:00 am to check on you and address any questions or concerns that you may have regarding the information given to you following your procedure. If we do not reach you, we will leave a message.     If any biopsies were taken you will be contacted by phone or by letter within the next 1-3 weeks.  Please call us at [phone] if you have not heard about the biopsies in 3 weeks.    SIGNATURES/CONFIDENTIALITY: You and/or your care partner have signed paperwork which will be entered into your electronic medical record.  These signatures attest to the fact that that the information above on your After Visit Summary has been reviewed and is understood.  Full responsibility of the confidentiality of this discharge information lies with you and/or your care-partner.

## 2023-02-18 NOTE — Progress Notes (Signed)
[facility] History and Physical   Primary Care Physician:  Caro Laroche, DO   Reason for Procedure:  Positive Cologuard  Plan:    colonoscopy with possible interventions as needed     HPI: Rhonda Brewer is a very pleasant 72 y.o. female here for colonoscopy for evaluation of positive cologuard.   The risks and benefits as well as alternatives of endoscopic procedure(s) have been discussed and reviewed. All questions answered. The patient agrees to proceed.    Past Medical History:  Diagnosis Date   Allergy    Arthritis    CAD (coronary artery disease)    a. DES to RCA 11/2015   Cancer [facility])    Uterine 2008   Cataract    CHF (congestive heart failure) (HCC)    GERD (gastroesophageal reflux disease)    Heart murmur    Hyperlipidemia    Osteoporosis    Thyroid disease     Past Surgical History:  Procedure Laterality Date   ABDOMINAL HYSTERECTOMY     APPENDECTOMY     CARDIAC ELECTROPHYSIOLOGY MAPPING AND ABLATION  04/19/2003   CARPAL TUNNEL RELEASE Right 10/15/2017   CARPAL TUNNEL RELEASE Left 06/14/2022   CATARACT EXTRACTION Bilateral    07/2021 R 08/2021 L   COLONOSCOPY     CORONARY ANGIOPLASTY WITH STENT PLACEMENT     DE QUERVAIN'S RELEASE Left 06/14/2022   RIGHT/LEFT HEART CATH AND CORONARY ANGIOGRAPHY [name] 08/14/2017   Procedure: RIGHT/LEFT HEART CATH AND CORONARY ANGIOGRAPHY;  Surgeon: Dolores Patty, MD;  [facility]: [facility] INV[facility] CV LAB;  Service: Cardiovascular;  Laterality: [name];   RIGHT/LEFT HEART CATH AND CORONARY ANGIOGRAPHY [name] 11/20/2021   Procedure: RIGHT/LEFT HEART CATH AND CORONARY ANGIOGRAPHY;  Surgeon: Dolores Patty, MD;  Location: [facility]VAS[facility] CV LAB;  Service: Cardiovascular;  Laterality: [name];   ULTRASOUND GUIDANCE FOR VASCULAR ACCESS  08/14/2017   Procedure: Ultrasound Guidance For Vascular Access;  Surgeon: Dolores Patty, MD;  Location: [facility] INVASIVE CV LAB;  Service: Cardiovascular;;   UPPER GI ENDOSCOPY  07/15/2022     Prior to Admission medications   Medication Sig Start Date End Date Taking? Authorizing Provider  aspirin EC 81 MG tablet Take 81 mg by mouth at bedtime. 12/05/14  Yes [provider]  atorvastatin (LIPITOR) 40 MG tablet TAKE 1 TABLET BY MOUTH DAILY 01/06/23  Yes Bensimhon, Bevelyn Buckles, MD  Bacillus Coagulans-Inulin (PROBIOTIC) 1-250 BILLION-MG CAPS    Yes [provider]  Calcium Carbonate-Vitamin D (CALCIUM-VITAMIN D3 PO) Take 1 tablet by mouth in the morning and at bedtime.   Yes [provider]  Cyanocobalamin (VITAMIN B-12 PO) Take 1 tablet by mouth in the morning.   Yes [provider]  cycloSPORINE (RESTASIS) 0.05 % ophthalmic emulsion Place 1 drop into both eyes 2 (two) times daily.   Yes [provider]  famotidine (PEPCID) 20 MG tablet TAKE 1 TABLET(20 MG) BY MOUTH AT BEDTIME 09/25/22  Yes Melisssa Donner, Eleonore Chiquito, MD  Glucos-Chond-MSM-Bor-D3-Hyalur (MOVE FREE JOINT HEALTH ADV + D PO)    Yes [provider]  levothyroxine (SYNTHROID) 75 MCG tablet Take 1 tablet (75 mcg total) by mouth daily before breakfast. 09/20/22  Yes Rumball, Darl Householder, DO  losartan (COZAAR) 25 MG tablet Take 25 mg by mouth daily.   Yes [provider]  Multiple Vitamin (MULTI-VITAMINS) TABS Take 1 tablet by mouth in the morning.   Yes [provider]  pantoprazole (PROTONIX) 40 MG tablet TAKE 1 TABLET BY MOUTH DAILY 09/16/22  Yes  Caro Laroche, DO  REPATHA SURECLICK 140 MG/ML SOAJ INJECT 140MG  SUBCUTANEOUSLY  EVERY 2 WEEKS 12/20/22  Yes Bensimhon, Bevelyn Buckles, MD  acetaminophen (TYLENOL) 500 MG tablet Take 500-1,000 mg by mouth every 6 (six) hours as needed (pain.).    [provider]  albuterol (VENTOLIN HFA) 108 (90 Base) MCG/ACT inhaler Inhale 2 puffs into the lungs every 4 (four) hours as needed for wheezing or shortness of breath. 08/09/21   Hensel, Santiago Bumpers, MD  dicyclomine (BENTYL) 10 MG capsule Take 1 capsule (10 mg total) by mouth 3  (three) times daily as needed for spasms. 10/09/22   Napoleon Form, MD  fexofenadine (ALLEGRA) 180 MG tablet Take 180 mg by mouth as needed for allergies or rhinitis.    [provider]  losartan (COZAAR) 25 MG tablet Take 1 tablet (25 mg total) by mouth daily. 09/24/22 01/23/23  Bensimhon, Bevelyn Buckles, MD  VAGINAL LUBRICANT VA Place vaginally.    [provider]  valACYclovir (VALTREX) 500 MG tablet Take 500 mg by mouth See admin instructions. Take 1 tablet (500 mg) by mouth twice daily x 5 day at onset of outbreak.    [provider]    Current Outpatient Medications  Medication Sig Dispense Refill   aspirin EC 81 MG tablet Take 81 mg by mouth at bedtime.     atorvastatin (LIPITOR) 40 MG tablet TAKE 1 TABLET BY MOUTH DAILY 100 tablet 2   Bacillus Coagulans-Inulin (PROBIOTIC) 1-250 BILLION-MG CAPS      Calcium Carbonate-Vitamin D (CALCIUM-VITAMIN D3 PO) Take 1 tablet by mouth in the morning and at bedtime.     Cyanocobalamin (VITAMIN B-12 PO) Take 1 tablet by mouth in the morning.     cycloSPORINE (RESTASIS) 0.05 % ophthalmic emulsion Place 1 drop into both eyes 2 (two) times daily.     famotidine (PEPCID) 20 MG tablet TAKE 1 TABLET(20 MG) BY MOUTH AT BEDTIME 90 tablet 3   Glucos-Chond-MSM-Bor-D3-Hyalur (MOVE FREE JOINT HEALTH ADV + D PO)      levothyroxine (SYNTHROID) 75 MCG tablet Take 1 tablet (75 mcg total) by mouth daily before breakfast. 90 tablet 3   losartan (COZAAR) 25 MG tablet Take 25 mg by mouth daily.     Multiple Vitamin (MULTI-VITAMINS) TABS Take 1 tablet by mouth in the morning.     pantoprazole (PROTONIX) 40 MG tablet TAKE 1 TABLET BY MOUTH DAILY 100 tablet 2   REPATHA SURECLICK 140 MG/ML SOAJ INJECT 140MG  SUBCUTANEOUSLY  EVERY 2 WEEKS 6 mL 3   acetaminophen (TYLENOL) 500 MG tablet Take 500-1,000 mg by mouth every 6 (six) hours as needed (pain.).     albuterol (VENTOLIN HFA) 108 (90 Base) MCG/ACT inhaler Inhale 2 puffs into the lungs every 4 (four)  hours as needed for wheezing or shortness of breath. 18 g 6   dicyclomine (BENTYL) 10 MG capsule Take 1 capsule (10 mg total) by mouth 3 (three) times daily as needed for spasms. 30 capsule 0   fexofenadine (ALLEGRA) 180 MG tablet Take 180 mg by mouth as needed for allergies or rhinitis.     losartan (COZAAR) 25 MG tablet Take 1 tablet (25 mg total) by mouth daily. 90 tablet 3   VAGINAL LUBRICANT VA Place vaginally.     valACYclovir (VALTREX) 500 MG tablet Take 500 mg by mouth See admin instructions. Take 1 tablet (500 mg) by mouth twice daily x 5 day at onset of outbreak.     Current Facility-Administered Medications  Medication  Dose Route Frequency Provider Last Rate Last Admin   0.9 %  sodium chloride infusion  500 mL Intravenous Once Napoleon Form, MD        Allergies as of 02/18/2023 - Review Complete 02/18/2023  Allergen Reaction Noted   Avelox [moxifloxacin hcl in nacl] Other (See Comments) 06/19/2016   Latex Rash 02/18/2023   Other Other (See Comments) 07/12/2015    Family History  Problem Relation Age of Onset   Heart disease Mother    Diabetes Mother    Heart disease Father    Stomach cancer Brother    Diabetes Brother    Asthma Maternal Aunt    Colon cancer Neg Hx    Esophageal cancer Neg Hx    Rectal cancer Neg Hx    Colon polyps Neg Hx     Social History   Socioeconomic History   Marital status: Married    Spouse name: Juliene Pina   Number of children: 2   Years of education: 12   Highest education level: Some college, no degree  Occupational History   Occupation: retired    Comment: Research scientist (medical)  Tobacco Use   Smoking status: Never    Passive exposure: Past   Smokeless tobacco: Never  Vaping Use   Vaping status: Never Used  Substance and Sexual Activity   Alcohol use: Yes    Comment: occasional wine   Drug use: No   Sexual activity: Yes    Birth control/protection: Surgical    Comment: hysterectomy  Other Topics Concern   Not on file   Social History Narrative   Lives in home with husband. Daughters live out of state. Step-daughter in [place]. Has 8 grandchildren. Lives in 2 level home but most living is on first floor. Stairs have handrails, no problem with stairs. No grab bars in bathroom. No tripping hazards. Smoke alarms present.   No pets.    Eats a good variety of foods, husband is a cook. Eats meats, fruits, vegetables. Drinks water mostly. Occasional tea.   Goes to gym 5 times a week. Enjoys reading, sewing, movies. Walking when weather is nice.    Wears seat belt in car.    Social Determinants of Health   Financial Resource Strain: Low Risk  (01/02/2023)   Overall Financial Resource Strain (CARDIA)    Difficulty of Paying Living Expenses: Not hard at all  Food Insecurity: No Food Insecurity (01/02/2023)   Hunger Vital Sign    Worried About Running Out of Food in the Last Year: Never true    Ran Out of Food in the Last Year: Never true  Transportation Needs: No Transportation Needs (01/02/2023)   PRAPARE - Administrator, Civil Service (Medical): No    Lack of Transportation (Non-Medical): No  Physical Activity: Sufficiently Active (01/02/2023)   Exercise Vital Sign    Days of Exercise per Week: 6 days    Minutes of Exercise per Session: 60 min  Stress: No Stress Concern Present (01/02/2023)   [employer] of Occupational Health - Occupational Stress Questionnaire    Feeling of Stress : Not at all  Social Connections: Unknown (01/02/2023)   Social Connection and Isolation Panel [NHANES]    Frequency of Communication with Friends and Family: More than three times a week    Frequency of Social Gatherings with Friends and Family: More than three times a week    Attends Religious Services: Patient declined    Database administrator or Organizations: Yes  Attends Banker Meetings: More than 4 times per year    Marital Status: Married  Catering manager Violence: Not At Risk  (05/10/2022)   Humiliation, Afraid, Rape, and Kick questionnaire    Fear of Current or Ex-Partner: No    Emotionally Abused: No    Physically Abused: No    Sexually Abused: No    Review of Systems:  All other review of systems negative except as mentioned in the HPI.  Physical Exam: Vital signs in last 24 hours: BP 137/70   Pulse 66   Temp 97.7 F (36.5 C) (Temporal)   Ht 5\' 4"  (1.626 m)   Wt 144 lb (65.3 kg)   SpO2 97%   BMI 24.72 kg/m  General:   Alert, NAD Lungs:  Clear .   Heart:  Regular rate and rhythm Abdomen:  Soft, nontender and nondistended. Neuro/Psych:  Alert and cooperative. Normal mood and affect. A and O x 3  Reviewed labs, radiology imaging, old records and pertinent past GI work up  Patient is appropriate for planned procedure(s) and anesthesia in an ambulatory setting   K. Scherry Ran , MD [phone]

## 2023-02-18 NOTE — Op Note (Signed)
[facility] Patient Name: Rhonda Brewer Procedure Date: 02/18/2023 10:29 AM MRN: [id number] Endoscopist: Napoleon Form , MD, [id number] Age: 72 Referring MD:  Date of Birth: 01-Nov-1950 Gender: Female Account #: [id number] Procedure:                Colonoscopy Indications:              Positive Cologuard test Medicines:                Monitored Anesthesia Care Procedure:                Pre-Anesthesia Assessment:                           - Prior to the procedure, a History and Physical                            was performed, and patient medications and                            allergies were reviewed. The patient's tolerance of                            previous anesthesia was also reviewed. The risks                            and benefits of the procedure and the sedation                            options and risks were discussed with the patient.                            All questions were answered, and informed consent                            was obtained. Prior Anticoagulants: The patient has                            taken no anticoagulant or antiplatelet agents. ASA                            Grade Assessment: III - A patient with severe                            systemic disease. After reviewing the risks and                            benefits, the patient was deemed in satisfactory                            condition to undergo the procedure.                           After obtaining informed consent, the colonoscope  was passed under direct vision. Throughout the                            procedure, the patient's blood pressure, pulse, and                            oxygen saturations were monitored continuously. The                            Olympus Scope SN [id number] was introduced through the                            anus and advanced to the the cecum, identified by                            appendiceal orifice  and ileocecal valve. The                            colonoscopy was performed without difficulty. The                            patient tolerated the procedure well. The quality                            of the bowel preparation was good. The ileocecal                            valve, appendiceal orifice, and rectum were                            photographed. Scope In: 10:37:56 AM Scope Out: 10:51:48 AM Scope Withdrawal Time: 0 hours 9 minutes 57 seconds  Total Procedure Duration: 0 hours 13 minutes 52 seconds  Findings:                 The perianal and digital rectal examinations were                            normal.                           A 16 mm polyp was found in the ascending colon. The                            polyp was semi-pedunculated. The polyp was removed                            with a hot snare. Resection and retrieval were                            complete.                           A 4 mm polyp was found in the transverse colon. The  polyp was sessile. The polyp was removed with a                            cold snare. Resection and retrieval were complete.                           A patchy area of mildly erythematous mucosa was                            found in the rectum. Biopsies were taken with a                            cold forceps for histology.                           Non-bleeding external and internal hemorrhoids were                            found during retroflexion. The hemorrhoids were                            medium-sized. Complications:            No immediate complications. Estimated Blood Loss:     Estimated blood loss was minimal. Impression:               - One 16 mm polyp in the ascending colon, removed                            with a hot snare. Resected and retrieved.                           - One 4 mm polyp in the transverse colon, removed                            with a cold snare. Resected  and retrieved.                           - Erythematous mucosa in the rectum. Biopsied.                           - Non-bleeding external and internal hemorrhoids. Recommendation:           - Patient has a contact number available for                            emergencies. The signs and symptoms of potential                            delayed complications were discussed with the                            patient. Return to normal activities tomorrow.  Written discharge instructions were provided to the                            patient.                           - Resume previous diet.                           - Continue present medications.                           - Await pathology results.                           - Repeat colonoscopy in 3 years for surveillance                            based on pathology results. Napoleon Form, MD 02/18/2023 10:55:11 AM This report has been signed electronically.

## 2023-02-18 NOTE — Progress Notes (Signed)
Vss nad trans to pacu 

## 2023-02-18 NOTE — Progress Notes (Signed)
Pt's states no medical or surgical changes since previsit or office visit. 

## 2023-02-19 ENCOUNTER — Telehealth: Payer: Self-pay

## 2023-02-19 ENCOUNTER — Other Ambulatory Visit: Payer: Medicare Other

## 2023-02-19 DIAGNOSIS — M341 CR(E)ST syndrome: Secondary | ICD-10-CM

## 2023-02-19 DIAGNOSIS — G8929 Other chronic pain: Secondary | ICD-10-CM | POA: Diagnosis not present

## 2023-02-19 DIAGNOSIS — E039 Hypothyroidism, unspecified: Secondary | ICD-10-CM

## 2023-02-19 DIAGNOSIS — M5441 Lumbago with sciatica, right side: Secondary | ICD-10-CM | POA: Diagnosis not present

## 2023-02-19 LAB — PULMONARY FUNCTION TEST
DL/VA % pred: 77 %
DL/VA: 3.2 ml/min/mmHg/L
DLCO cor % pred: 72 %
DLCO cor: 13.98 ml/min/mmHg
DLCO unc % pred: 72 %
DLCO unc: 13.98 ml/min/mmHg
FEF 25-75 Pre: 1.44 L/s
FEF2575-%Pred-Pre: 79 %
FEV1-%Pred-Pre: 85 %
FEV1-Pre: 1.88 L
FEV1FVC-%Pred-Pre: 98 %
FEV6-%Pred-Pre: 90 %
FEV6-Pre: 2.54 L
FEV6FVC-%Pred-Pre: 104 %
FVC-%Pred-Pre: 86 %
FVC-Pre: 2.54 L
Pre FEV1/FVC ratio: 74 %
Pre FEV6/FVC Ratio: 100 %

## 2023-02-19 NOTE — Telephone Encounter (Signed)
  Follow up Call-     02/18/2023    9:57 AM 07/15/2022    9:14 AM  Call back number  Post procedure Call Back phone  # [phone]  Permission to leave phone message Yes Yes     Patient questions:  Do you have a fever, pain , or abdominal swelling? No. Pain Score  0 *  Have you tolerated food without any problems? Yes.    Have you been able to return to your normal activities? Yes.    Do you have any questions about your discharge instructions: Diet   No. Medications  No. Follow up visit  No.  Do you have questions or concerns about your Care? No.  Actions: * If pain score is 4 or above: No action needed, pain <4.

## 2023-02-20 ENCOUNTER — Telehealth: Payer: Medicare Other | Admitting: Internal Medicine

## 2023-02-20 DIAGNOSIS — M349 Systemic sclerosis, unspecified: Secondary | ICD-10-CM | POA: Diagnosis not present

## 2023-02-20 DIAGNOSIS — R0600 Dyspnea, unspecified: Secondary | ICD-10-CM

## 2023-02-20 DIAGNOSIS — R0689 Other abnormalities of breathing: Secondary | ICD-10-CM

## 2023-02-20 LAB — COMPREHENSIVE METABOLIC PANEL
ALT: 13 [IU]/L (ref 0–32)
AST: 18 [IU]/L (ref 0–40)
Albumin: 4.2 g/dL (ref 3.8–4.8)
Alkaline Phosphatase: 91 [IU]/L (ref 44–121)
BUN/Creatinine Ratio: 15 (ref 12–28)
BUN: 13 mg/dL (ref 8–27)
Bilirubin Total: 0.4 mg/dL (ref 0.0–1.2)
CO2: 22 mmol/L (ref 20–29)
Calcium: 9.4 mg/dL (ref 8.7–10.3)
Chloride: 103 mmol/L (ref 96–106)
Creatinine, Ser: 0.85 mg/dL (ref 0.57–1.00)
Globulin, Total: 2.5 g/dL (ref 1.5–4.5)
Glucose: 89 mg/dL (ref 70–99)
Potassium: 4.9 mmol/L (ref 3.5–5.2)
Sodium: 138 mmol/L (ref 134–144)
Total Protein: 6.7 g/dL (ref 6.0–8.5)
eGFR: 73 mL/min/{1.73_m2} (ref >59)

## 2023-02-20 LAB — TSH: TSH: 3.13 u[IU]/mL (ref 0.450–4.500)

## 2023-02-20 LAB — SURGICAL PATHOLOGY

## 2023-02-20 NOTE — Progress Notes (Signed)
PCP Rhonda Manners, MD   HPI  IOV 08/16/2016  Chief Complaint  Patient presents with   Advice Only    Referred by Dr. Corliss Brewer for scleroderma.  c/o worsening sob, chest tightness with exertion X1 month.    HPI 72 year old woman with history of scleroderma on hydroxychloroquine, CAD s/p stent 11/2015, uterine cancer dx in 2008 s/p hysterectomy, chemo/radtx presenting for evaluation of dyspnea on exertion.  She is followed by Dr. Titus Brewer for rheumatology, Dr. Gala Brewer for cardiology. Previously was followed by Dr. Robb Brewer in [place]. Last saw Dr. Corliss Brewer 06/19/2016. She has had scleroderma for 10 years and only has been on hydroxychloroquine. She moved to [facility] in January 2018.  For the past month, she has dyspnea with going up one flight of stairs most times. This is new. Also sometimes gets short of breath with bending over to pick something up. Denies cough. She does yoga, strength training, and walking for exercise. She can walk a couple of miles without any issues. She is independent in all her activities of daily living. Does not use any devices to aid in walking. Denies GERD. Has not had a pulmonologist.  She had shortness of breath that prompted the cardiac evaluation leading to stent placement. She has an appointment with cardiology June 12. Review of Systems  S: See resident physician for details. She has scleroderma for over 10 years for which she is on Actonel. Denies any associated acid reflux. Started on was believed only to involve the skin. I personally evaluated the history that this patient had a few months of shortness of breath in 2017 between summer and fall that then resulted in worsening dyspnea on exertion. That then resulted in a cardiac stent. After this dyspnea resolved. Then subsequently in January 2018 moved from the [place] area to [place], [place]. Now for the last 1 month she's having recurrent dyspnea on exertion. She feels this is  from the heart. She does not think is a lung issue. Relieved by rest. She notices it for climbing stairs relieved by rest. She has cardiology appointment pending. There is a pulmonary function test and echocardiogram pending on 08/27/2016. She is reluctant to get a CT chest. Walking desat test in office 185 feet x  3 laps on RA: 100% at rest and exertion  Recent pertinent labs  Results for Rhonda, Brewer (MRN [id number]) as of 08/16/2016 10:29  Ref. Range 06/19/2016 10:38 06/26/2016 10:04  Creatinine Latest Ref Range: 0.50 - 0.99 mg/dL 3.66   Results for Rhonda, Brewer (MRN [id number]) as of 08/16/2016 10:29  Ref. Range 06/19/2016 10:38 06/26/2016 10:04  Hemoglobin Latest Ref Range: 11.7 - 15.5 g/dL 95.6      has a past medical history of Cancer (HCC).   reports that she has never smoked. She has never used smokeless tobacco.   OV 11/05/2016  Chief Complaint  Patient presents with   Follow-up    Pt here after CT and PFT. Pt denies change in SOB since last OV. Pt denies cough, CP/tightness, f/c/s.     Follow-up scleroderma with associated shortness of breath   last seen in June 2018. At that time he thought the suspicion for interstitial lung disease was low. She had Pulm  function test that showed isolated reduction in diffusion capacity to 63%. This raises the possibility of interstitial lung disease but she did have a high-resolution CT scan of the chest that is documented below. Interstitial lung disease has been ruled out.  This no pulmonary parenchymal abnormality. Her hemoglobin was 14.8 g percent suggesting no anemia causing shortness of breath. She then followed up with cardiology. According to the echocardiogram she does not have pulmonary hypertension which can be seen and started on the patient's. She was on  BRILINTA for CAD - this got changed to Plavix and her dyspnea resolved. Currently she is doing well does not have any rest symptoms.   Results for Rhonda, Brewer (MRN [id number]) as of  11/05/2016 11:24  Ref. Range 08/27/2016 11:19  FVC-Pre Latest Units: L 2.86  FVC-%Pred-Pre Latest Units: % 91  FEV1-Pre Latest Units: L 2.11  FEV1-%Pred-Pre Latest Units: % 88  Pre FEV1/FVC ratio Latest Units: % 74    Results for Rhonda, Brewer (MRN [id number]) as of 11/05/2016 11:24  Ref. Range 08/27/2016 11:19  TLC Latest Units: L 4.64  TLC % pred Latest Units: % 91  Results for Rhonda, Brewer (MRN [id number]) as of 11/05/2016 11:24  Ref. Range 08/27/2016 11:19  DLCO unc Latest Units: ml/min/mmHg 15.42  DLCO unc % pred Latest Units: % 63   IMPRESSION: 1. No evidence of interstitial lung disease. No acute pulmonary disease . 2. Solitary 3 mm solid apical right upper lobe pulmonary nodule. No follow-up needed if patient is low-risk. Non-contrast chest CT can be considered in 12 months if patient is high-risk. This recommendation follows the consensus statement: Guidelines for Management of Incidental Pulmonary Nodules Detected on CT Images: From the Fleischner Society 2017; Radiology 2017; 284:228-243. 3. Small pericardial effusion/thickening. 4. Three-vessel coronary atherosclerosis.   Aortic Atherosclerosis (ICD10-I70.0).     Electronically Signed   By: Rhonda Brewer M.D.   On: 09/03/2016 15:18  OV 12/16/2017  Subjective:  Patient ID: Rhonda Brewer, female , DOB: June 08, 1950 , age 72 y.o. y.o. , MRN: [id number] , ADDRESS: [address] [place] [facility] [address]   12/16/2017 -   Chief Complaint  Patient presents with   Follow-up    Pt is here for a 1 year follow up and states she has been doing well. Pt denies any complaints.     HPI Rhonda Brewer 72 y.o. -1 year follow-up for ILD monitoring in the setting of scleroderma.  She says overall she is stable.  She is no longer on Plaquenil for scleroderma.  She continues to have Raynaud.  She tells me other than extremely mild shortness of breath IV exertion she is stable.  There is no interim medical issues or surgical issues.  No change in  medications of significance.  No ER visits no hospitalizations.  Only interim medical issues carpal tunnel surgery.  She had a CT scan of the chest July 2019.  This is not a high-resolution CT chest.  On the report there is no evidence of ILD.  Last echocardiogram June 2018 without any pulmonary hypertension.  There is no wheezing cough orthopnea proximal nocturnal dyspnea.    OV 08/10/2020  Subjective:  Patient ID: Rhonda Brewer, female , DOB: 1950-05-06 , age 20 y.o. , MRN: [id number] , ADDRESS: [address] [place] [facility] [address] PCP Rhonda Manners, MD Patient Care Team: Rhonda Manners, MD as PCP - General (Family Medicine) Bensimhon, Bevelyn Buckles, MD as PCP - Advanced Heart Failure (Cardiology) Pollyann Savoy, MD as Consulting Physician (Rheumatology) Bensimhon, Bevelyn Buckles, MD as Consulting Physician (Cardiology) Napoleon Form, MD as Consulting Physician (Gastroenterology) Kalman Shan, MD as Consulting Physician (Pulmonary Disease) Sallye Lat, MD as Consulting Physician (Ophthalmology) Elon Spanner Madelaine Etienne, MD as Consulting Physician (Obstetrics and Gynecology)  This Provider for this visit: Treatment Team:  Attending Provider: Kalman Shan, MD    08/10/2020 -   Chief Complaint  Patient presents with   Follow-up    Pt states she has been doing okay since last visit. Denies any issues with her breathing.   Follow-up scleroderma with very mild shortness of breath.  At risk for ILD.  - 2019 CT without ILD -2020 pulmonary function test with improvement/normal  HPI Rhonda Brewer 72 y.o. -returns for follow-up.  I personally saw her in 2019.  Then after that because of the pandemic I did not see her.  She did see nurse practitioner December 2020.  Pulmonary function test was normal//slightly improved/stable.  She is on expectant follow-up with monitoring.  In April 2022 she is a Dr. D her rheumatologist.  She asked to be established back in pulmonary to  see me.  Patient has continued ongoing issues with sclerodactyly and also Raynaud's.  In January 2022 she had echocardiogram that I visualized the result.  It shows valvular regurgitation is worse but no evidence of pulmonary hypertension.  She has very mild shortness of breath if at all for climbing stairs.  Symptom scores are not detailed below.  Walking desaturation test is stable compared to 3 years ago and normal.   PFT Subjective:  Patient ID: Rhonda Brewer, female , DOB: 10/30/1950 , age 24 y.o. , MRN: [id number] , ADDRESS: [address] [place] [facility] [address] PCP Rhonda Manners, MD Patient Care Team: Rhonda Manners, MD as PCP - General (Family Medicine) Bensimhon, Bevelyn Buckles, MD as PCP - Advanced Heart Failure (Cardiology) Pollyann Savoy, MD as Consulting Physician (Rheumatology) Bensimhon, Bevelyn Buckles, MD as Consulting Physician (Cardiology) Napoleon Form, MD as Consulting Physician (Gastroenterology) Kalman Shan, MD as Consulting Physician (Pulmonary Disease) Sallye Lat, MD as Consulting Physician (Ophthalmology) Elon Spanner Madelaine Etienne, MD as Consulting Physician (Obstetrics and Gynecology)  This Provider for this visit: Treatment Team:  Attending Provider: Kalman Shan, MD    03/01/2021 -   Chief Complaint  Patient presents with   Follow-up    PFT performed today.  Pt states she has been doing okay since last visit and denies any complaints.   Follow-up scleroderma with very mild shortness of breath.  At risk for ILD.  - 2019 CT without ILD -2020 pulmonary function test with improvement/normal  HPI Rhonda Brewer 72 y.o. -returns for follow-up.  In the last 6 months she reports no change in her shortness of breath.  She admits to shortness of breath for stairs relieved by rest.  Last visit 2 she had the same thing but she scored 0 but she actually tells me she has shortness of breath with stairs relieved by rest.  There is no associated chest  pain.  She says this is stable without any worsening.  No cough.  She had pulmonary function test today it shows a slight drop in DLCO below 80%.  The first time there is a drop this low.  I did share these results with her.  Last CT scan of the chest was in 2019.  She is willing to have another CT scan of the chest  She is on pulmonary hypertension monitoring through Dr. Gala Brewer.  Next echocardiogram was in March 2023 according to history.    CT Chest data  No results found.  OV 09/07/2021  Subjective:  Patient ID: Rhonda Brewer, female , DOB: 1950-09-02 , age 62 y.o. , MRN: [id number] , ADDRESS: [phone]  [place] [address] PCP Leveda Anna Santiago Bumpers, MD Patient Care Team: Rhonda Manners, MD as PCP - General (Family Medicine) Bensimhon, Bevelyn Buckles, MD as PCP - Advanced Heart Failure (Cardiology) Pollyann Savoy, MD as Consulting Physician (Rheumatology) Bensimhon, Bevelyn Buckles, MD as Consulting Physician (Cardiology) Napoleon Form, MD as Consulting Physician (Gastroenterology) Kalman Shan, MD as Consulting Physician (Pulmonary Disease) Sallye Lat, MD as Consulting Physician (Ophthalmology) Elon Spanner Madelaine Etienne, MD as Consulting Physician (Obstetrics and Gynecology)  This Provider for this visit: Treatment Team:  Attending Provider: Kalman Shan, MD    09/07/2021 -   Chief Complaint  Patient presents with   Follow-up    PFT performed today.  Pt states she has been doing okay since last visit and denies any complaints.   Follow-up scleroderma at risk for ILD.  Normal pulmonary function test.  HPI Rhonda Brewer 72 y.o. -returns for follow-up.  Last visit there was concern that the pulmonary function test was changing.  So we repeated it.  Brought in 14-month interval.  The test is actually normal.  High-resolution CT chest shows that there is no ILD.  She just has planned scarring.  She continues to feel well.  Very minimal to no symptoms.   Echocardiogram is being monitored by cardiology.  She has no new complaints.  No changes to health status.  She is content with the level of monitoring.  We briefly discussed possible enrollment in clinical trials: She might be interested We also discussed joining support group: We gave her the number for Lupita Leash the support group leader.  Of note CT scan shows possible cirrhosis of the liver.  Personally visualized the images of the pulmonary function test and CT scan.     OV 01/21/2023  Subjective:  Patient ID: Rhonda Brewer, female , DOB: [birth year] , age 49 y.o. , MRN: [id number] , ADDRESS: [address] [place] [facility] [address] PCP Caro Laroche, DO Patient Care Team: Caro Laroche, DO as PCP - General (Family Medicine) Bensimhon, Bevelyn Buckles, MD as PCP - Advanced Heart Failure (Cardiology) Pollyann Savoy, MD as Consulting Physician (Rheumatology) Bensimhon, Bevelyn Buckles, MD as Consulting Physician (Cardiology) Napoleon Form, MD as Consulting Physician (Gastroenterology) Kalman Shan, MD as Consulting Physician (Pulmonary Disease) Sallye Lat, MD as Consulting Physician (Ophthalmology) Elon Spanner Madelaine Etienne, MD as Consulting Physician (Obstetrics and Gynecology)  This Provider for this visit: Treatment Team:  Attending Provider: Kalman Shan, MD    01/21/2023 -   Chief Complaint  Patient presents with   Follow-up    Pft f/u   Scleroderma without ILD but mild bland scarring on surveillance  HPI Rhonda Brewer 72 y.o. -returns for follow-up.  Last seen in June 2023.  She tells me that since then she has had insidious onset of shortness of breath.  Present walking stairs.  It was always mild but now it is more noticeable.  She is also having mild chronic dry cough at night there is no wheezing.  This cough is also more noticeable.  Definitely symptoms are progressive in the last 6 months.  Symptom score below clearly shows that the symptoms were absent  in 2022 and now present and more prominent.  She had an echocardiogram in August 2024 and it is normal.  She had pulmonary function test today that I reviewed and it shows a decline FVC is now 2.5 L / 86% and DLCO is now 13.98/72%.  There is a change.  I visualized the previous CT scan and agreed with  the previous findings.  I did indicate to her that I am concerned about potential ILD developing.  She also has seasonal allergies we will check a CBC with differential blood IgE She indicated to me she has family Struve diabetes and wants her hemoglobin A1c checked.      OV 02/20/2023  Subjective:  Patient ID: Rhonda Brewer, female , DOB: 13-Dec-1950 , age 13 y.o. , MRN: [id number] , ADDRESS: [address] [place] [address] PCP Caro Laroche, DO Patient Care Team: Caro Laroche, DO as PCP - General (Family Medicine) Bensimhon, Bevelyn Buckles, MD as PCP - Advanced Heart Failure (Cardiology) Pollyann Savoy, MD as Consulting Physician (Rheumatology) Bensimhon, Bevelyn Buckles, MD as Consulting Physician (Cardiology) Napoleon Form, MD as Consulting Physician (Gastroenterology) Kalman Shan, MD as Consulting Physician (Pulmonary Disease) Sallye Lat, MD as Consulting Physician (Ophthalmology) Elon Spanner Madelaine Etienne, MD as Consulting Physician (Obstetrics and Gynecology)  This Provider for this visit: Treatment Team:  Attending Provider: Kalman Shan, MD  Type of visit: Video Virtual Visit Identification of patient Rhonda Brewer with 12/28/1950 and MRN [id number] - 2 person identifier Risks: Risks, benefits, limitations of telephone visit explained. Patient understood and verbalized agreement to proceed Anyone else on call: jus patient Patient location: her home This provider location: [address]; [place]; [place] [address]. [facility]. [phone]     02/20/2023 -  scleroderma patinet    HPI Rhonda Brewer 72 y.o. -presents  for this video visit to discuss test results.  Last visit perhaps she was a little more short of breath but perhaps not but pulmonary function test suggestive of decline.  Therefore we did a blood BNP this is slightly high but otherwise normal.  Hemoglobin A1c was normal.  Anemia test was normal.  Recent echo this year was also normal.  She open overall she is continuing to do stable and no interim complaints.  CT scan visualized personally and there is no evidence of ILD.  I showed this to her and also the June 2023 high-resolution CT chest.  Unfortunately the official report is still pending.  Nevertheless overall because of his symptom stability and no interim issues and in my personal visualization no development of ILD without this was reassuring.  She is going to monitor her symptoms and come back for follow-up in approximately 9 months with a breathing test.      SYMPTOM SCALE  08/10/2020  03/01/2021  01/21/2023   O2 use ra ra ra  Shortness of Breath 0 -> 5 scale with 5 being worst (score 6 If unable to do)    At rest 0 0 0  Simple tasks - showers, clothes change, eating, shaving 0 0 0  Household (dishes, doing bed, laundry) 000 0 1  Shopping 0 0 0  Walking level at own pace 0 0 0  Walking up Stairs 0 1 2  Total (30-36) Dyspnea Score 00 1 3  How bad is your cough? 0 0 2  How bad is your fatigue 0 0 0  How bad is nausea 00 0 0  How bad is vomiting?  0 0 0  How bad is diarrhea? 0 0 0  How bad is anxiety? 0 0 0  How bad is depression 0 0 0    Simple office walk 185 feet x  3 laps goal with forehead probe 12/16/2017  08/10/2020  01/21/2023   O2 used Room air ra ra  Number laps completed 3 3  Sit stand x 15  Comments about pace Normal brisk avg space   Resting Pulse Ox/HR 99% and 65/min 98% and HR 67 96% and HR 70  Final Pulse Ox/HR 99% and 74/min 98% and HR 82 95% and HR 77  Desaturated </= 88% no no   Desaturated <= 3% points no no   Got Tachycardic >/= 90/min no no   Symptoms  at end of test non3 No complaints   Miscellaneous comments Normal test      CT Chest data from date: 111/27/24  - personally visualized and independently interpreted : yes - my findings are: no ILD. -official report pending   PFT     Latest Ref Rng & Units 01/21/2023   10:55 AM 09/07/2021   11:39 AM 03/01/2021    2:50 PM 03/03/2019    2:00 PM 08/27/2016   11:19 AM  ILD indicators  FVC-Pre L 2.54  2.75  2.63  2.90  2.86   FVC-Predicted Pre % 86  91  87  94  91   FVC-Post L  2.69    2.98   FVC-Predicted Post %  89    94   TLC L  4.91    4.64   TLC Predicted %  97    91   DLCO uncorrected ml/min/mmHg 13.98  16.20  14.84  16.78  15.42   DLCO UNC %Pred % 72  83  76  85  63   DLCO Corrected ml/min/mmHg 13.98  16.10  14.84     DLCO COR %Pred % 72  82  76        Latest Reference Range & Units 01/21/23 12:45 02/19/23 10:41  COMPREHENSIVE METABOLIC PANEL   Rpt  Sodium 134 - 144 mmol/L  138  Potassium 3.5 - 5.2 mmol/L  4.9  Chloride 96 - 106 mmol/L  103  CO2 20 - 29 mmol/L  22  Glucose 70 - 99 mg/dL  89  BUN 8 - 27 mg/dL  13  Creatinine 6.38 - 1.00 mg/dL  7.56  Calcium 8.7 - 43.3 mg/dL  9.4  BUN/Creatinine Ratio 12 - 28   15  eGFR >59 mL/min/1.73  73  Alkaline Phosphatase 44 - 121 IU/L  91  Albumin 3.8 - 4.8 g/dL  4.2  AST 0 - 40 IU/L  18  ALT 0 - 32 IU/L  13  Total Protein 6.0 - 8.5 g/dL  6.7  Total Bilirubin 0.0 - 1.2 mg/dL  0.4  Pro B Natriuretic peptide (BNP) 0.0 - 100.0 pg/mL 106.0 (H)   Globulin, Total 1.5 - 4.5 g/dL  2.5  WBC 4.0 - 29.5 K/uL 6.9   RBC 3.87 - 5.11 Mil/uL 4.41   Hemoglobin 12.0 - 15.0 g/dL 18.8   HCT 41.6 - 60.6 % 42.4   MCV 78.0 - 100.0 fl 96.2   MCHC 30.0 - 36.0 g/dL 30.1   RDW 60.1 - 09.3 % 13.6   Platelets 150.0 - 400.0 K/uL 225.0   Neutrophils 43.0 - 77.0 % 53.6   Lymphocytes 12.0 - 46.0 % 36.0   Monocytes Relative 3.0 - 12.0 % 7.6   Eosinophil 0.0 - 5.0 % 2.3   Basophil 0.0 - 3.0 % 0.5   NEUT# 1.4 - 7.7 K/uL 3.7   Lymphs Abs 0.7 - 4.0  K/uL 2.5   Monocyte # 0.1 - 1.0 K/uL 0.5   Eosinophils Absolute 0.0 - 0.7 K/uL 0.2   Basophils Absolute 0.0 - 0.1 K/uL 0.0   Hemoglobin  A1C 4.6 - 6.5 % 5.8   TSH 0.450 - 4.500 uIU/mL  3.130  IgE (Immunoglobulin E), Serum <OR=114 kU/L 45   (H): Data is abnormally high Rpt: View report in Results Review for more information     1. Left ventricular ejection fraction, by estimation, is 60 to 65%. The  left ventricle has normal function. The left ventricle has no regional  wall motion abnormalities. Left ventricular diastolic parameters were  normal.   2. Right ventricular systolic function is normal. The right ventricular  size is normal.   3. A small pericardial effusion is present. The pericardial effusion is  anterior to the right ventricle and localized near the right atrium.   4. The mitral valve is grossly normal. No evidence of mitral valve  regurgitation. No evidence of mitral stenosis.   5. The aortic valve is tricuspid. Aortic valve regurgitation is mild. No  aortic stenosis is present.   6. The inferior vena cava is dilated in size with >50% respiratory  variability, suggesting right atrial pressure of 8 mmHg.   LAB RESULTS last 96 hours No results found.  LAB RESULTS last 90 days Recent Results (from the past 2160 hour(s))  TSH     Status: Abnormal   Collection Time: 01/06/23 10:12 AM  Result Value Ref Range   TSH 5.360 (H) 0.450 - 4.500 uIU/mL  Cologuard     Status: Abnormal   Collection Time: 01/14/23  7:20 AM  Result Value Ref Range   COLOGUARD Positive (A) Negative    Comment:  POSITIVE TEST RESULT. A positive Cologuard result should be followed with a colonoscopy or visual examination of the colon. The normal value (reference range) for this assay is negative.  TEST DESCRIPTION: Composite algorithmic analysis of stool DNA-biomarkers with hemoglobin immunoassay.   Quantitative values of individual biomarkers are not reportable and are not associated with  individual biomarker result reference ranges. Cologuard is intended for colorectal cancer screening of adults of either sex, 45 years or older, who are at average-risk for colorectal cancer (CRC). Cologuard has been approved for use by the U.S. FDA. The performance of Cologuard was established in a cross sectional study of average-risk adults aged 44-84. Cologuard performance in patients ages 71 to 30 years was estimated by sub-group analysis of near-age groups. Colonoscopies performed for a positive result may find as the most clinically significant lesion: colorectal cancer [4.0%], advanced adenoma  (including sessile serrated polyps greater than or equal to 1cm diameter) [20%] or non- advanced adenoma [31%]; or no colorectal neoplasia [45%]. These estimates are derived from a prospective cross-sectional screening study of 10,000 individuals at average risk for colorectal cancer who were screened with both Cologuard and colonoscopy. (Imperiale T. et al, Macy Mis J Med 2014;370(14):1286-1297.) Cologuard may produce a false negative or false positive result (no colorectal cancer or precancerous polyp present at colonoscopy follow up). A negative Cologuard test result does not guarantee the absence of CRC or advanced adenoma (pre-cancer). The current Cologuard screening interval is every 3 years. Science writer and [place]. Therapist, music). Cologuard performance data in a 10,000 patient pivotal study using colonoscopy as the reference method can be accessed at the following location: www.exactl[employer].com/results. Additional description of the Cologuard test process,  warnings and precautions can be found at www.cologuard.com.   Pulmonary function test     Status: None   Collection Time: 01/21/23 10:55 AM  Result Value Ref Range   FVC-Pre 2.54 L   FVC-%Pred-Pre 86 %  FEV1-Pre 1.88 L   FEV1-%Pred-Pre 85 %   FEV6-Pre 2.54 L   FEV6-%Pred-Pre 90 %   Pre FEV1/FVC ratio 74 %   FEV1FVC-%Pred-Pre 98 %    Pre FEV6/FVC Ratio 100 %   FEV6FVC-%Pred-Pre 104 %   FEF 25-75 Pre 1.44 L/sec   FEF2575-%Pred-Pre 79 %   DLCO unc 13.98 ml/min/mmHg   DLCO unc % pred 72 %   DLCO cor 13.98 ml/min/mmHg   DLCO cor % pred 72 %   DL/VA 9.14 ml/min/mmHg/L   DL/VA % pred 77 %  Hemoglobin A1c     Status: None   Collection Time: 01/21/23 12:45 PM  Result Value Ref Range   Hgb A1c MFr Bld 5.8 4.6 - 6.5 %    Comment: Glycemic Control Guidelines for People with Diabetes:Non Diabetic:  <6%Goal of Therapy: <7%Additional Action Suggested:  >8%   B Nat Peptide     Status: Abnormal   Collection Time: 01/21/23 12:45 PM  Result Value Ref Range   Pro B Natriuretic peptide (BNP) 106.0 (H) 0.0 - 100.0 pg/mL  IgE     Status: None   Collection Time: 01/21/23 12:45 PM  Result Value Ref Range   IgE (Immunoglobulin E), Serum 45 <OR=114 kU/L  CBC with Differential     Status: None   Collection Time: 01/21/23 12:45 PM  Result Value Ref Range   WBC 6.9 4.0 - 10.5 K/uL   RBC 4.41 3.87 - 5.11 Mil/uL   Hemoglobin 13.9 12.0 - 15.0 g/dL   HCT 78.2 95.6 - 21.3 %   MCV 96.2 78.0 - 100.0 fl   MCHC 32.7 30.0 - 36.0 g/dL   RDW 08.6 57.8 - 46.9 %   Platelets 225.0 150.0 - 400.0 K/uL   Neutrophils Relative % 53.6 43.0 - 77.0 %   Lymphocytes Relative 36.0 12.0 - 46.0 %   Monocytes Relative 7.6 3.0 - 12.0 %   Eosinophils Relative 2.3 0.0 - 5.0 %   Basophils Relative 0.5 0.0 - 3.0 %   Neutro Abs 3.7 1.4 - 7.7 K/uL   Lymphs Abs 2.5 0.7 - 4.0 K/uL   Monocytes Absolute 0.5 0.1 - 1.0 K/uL   Eosinophils Absolute 0.2 0.0 - 0.7 K/uL   Basophils Absolute 0.0 0.0 - 0.1 K/uL  Surgical pathology ([facility])     Status: None   Collection Time: 02/18/23 12:00 AM  Result Value Ref Range   SURGICAL PATHOLOGY      SURGICAL PATHOLOGY [facility] [address] [place], [place] [address] Telephone [phone] or [phone] Fax [phone]  REPORT OF SURGICAL PATHOLOGY   Accession #:  [id number] Patient Name: Rhonda Brewer, Rhonda Brewer Visit # : [id number]  MRN: [id number] Physician: Napoleon Form DOB/Age [birth year] (Age: 53) Gender: F Collected Date: 02/18/2023 Received Date: 02/19/2023  FINAL DIAGNOSIS       1. Surgical [P], colon, transverse and ascending, polyp (2) :       FRAGMENTS OF TUBULAR ADENOMA.      NEGATIVE FOR HIGH-GRADE DYSPLASIA.       2. Surgical [P], colon, rectum :       COLONIC MUCOSA WITHOUT SIGNIFICANT DIAGNOSTIC ALTERATION.      NEGATIVE FOR ACTIVITY, CHRONICITY, GRANULOMA, DYSPLASIA OR MALIGNANCY.       ELECTRONIC SIGNATURE : Lance Coon Md, Pathologist, Electronic Signature  MICROSCOPIC DESCRIPTION  CASE COMMENTS STAINS USED IN DIAGNOSIS: H&E H&E-2 H&E    CLINICAL HISTORY  SPECIMEN(S) OBTAINED 1. Su rgical [P], Colon, Transverse  And Ascending, Polyp (2) 2. Surgical [P], Colon, Rectum  SPECIMEN COMMENTS: 1. Special screening for malignant neoplasms, colon; benign neoplasm of ascending colon; benign neoplasm of transverse colon SPECIMEN CLINICAL INFORMATION: 1. R/O adenoma 2. R/O other proctitis    Gross Description 1. Received in formalin are tan, soft tissue fragments that are submitted in toto.Number: multiple, Size: 0.2 cm smallest to 0.7 cm largest, (1B) ( TA ) 2. Received in formalin are tan, soft tissue fragments that are submitted in toto.Number: 2 Size: 0.3 cm, (1B) ( TA )        Report signed out from the following location(s) [place]. [facility] [address], [place] [address] CLIA #: [id number]  [facility] [address] [place], [place] [address] CLIA #: [id number]   Comprehensive metabolic panel     Status: None   Collection Time: 02/19/23 10:41 AM  Result Value Ref Range   Glucose 89 70 - 99 mg/dL   BUN 13 8 - 27 mg/dL   Creatinine, Ser 6.44 0.57 - 1.00 mg/dL   eGFR 73 >03 KV/QQV/9.56   BUN/Creatinine Ratio 15 12 - 28   Sodium 138 134 - 144 mmol/L    Potassium 4.9 3.5 - 5.2 mmol/L   Chloride 103 96 - 106 mmol/L   CO2 22 20 - 29 mmol/L   Calcium 9.4 8.7 - 10.3 mg/dL   Total Protein 6.7 6.0 - 8.5 g/dL   Albumin 4.2 3.8 - 4.8 g/dL   Globulin, Total 2.5 1.5 - 4.5 g/dL   Bilirubin Total 0.4 0.0 - 1.2 mg/dL   Alkaline Phosphatase 91 44 - 121 IU/L   AST 18 0 - 40 IU/L   ALT 13 0 - 32 IU/L  TSH     Status: None   Collection Time: 02/19/23 10:41 AM  Result Value Ref Range   TSH 3.130 0.450 - 4.500 uIU/mL         has a past medical history of Allergy, Arthritis, CAD (coronary artery disease), Cancer (HCC), Cataract, CHF (congestive heart failure) (HCC), GERD (gastroesophageal reflux disease), Heart murmur, Hyperlipidemia, Osteoporosis, and Thyroid disease.   reports that she has never smoked. She has been exposed to tobacco smoke. She has never used smokeless tobacco.  Past Surgical History:  Procedure Laterality Date   ABDOMINAL HYSTERECTOMY     APPENDECTOMY     CARDIAC ELECTROPHYSIOLOGY MAPPING AND ABLATION  04/19/2003   CARPAL TUNNEL RELEASE Right 10/15/2017   CARPAL TUNNEL RELEASE Left 06/14/2022   CATARACT EXTRACTION Bilateral    07/2021 R 08/2021 L   COLONOSCOPY     CORONARY ANGIOPLASTY WITH STENT PLACEMENT     DE QUERVAIN'S RELEASE Left 06/14/2022   RIGHT/LEFT HEART CATH AND CORONARY ANGIOGRAPHY [name] 08/14/2017   Procedure: RIGHT/LEFT HEART CATH AND CORONARY ANGIOGRAPHY;  Surgeon: Dolores Patty, MD;  Location: [facility] INVASIVE CV LAB;  Service: Cardiovascular;  Laterality: [name];   RIGHT/LEFT HEART CATH AND CORONARY ANGIOGRAPHY [name] 11/20/2021   Procedure: RIGHT/LEFT HEART CATH AND CORONARY ANGIOGRAPHY;  Surgeon: Dolores Patty, MD;  Location: [facility] INVASIVE CV LAB;  Service: Cardiovascular;  Laterality: [name];   ULTRASOUND GUIDANCE FOR VASCULAR ACCESS  08/14/2017   Procedure: Ultrasound Guidance For Vascular Access;  Surgeon: Dolores Patty, MD;  Location: [facility] INVASIVE CV LAB;  Service: Cardiovascular;;   UPPER GI  ENDOSCOPY  07/15/2022    Allergies  Allergen Reactions   Avelox [Moxifloxacin Hcl In Nacl] Other (See Comments)    dizziness  Latex Rash    Mild sensitivity( Rash)per patient-    Other Other (See Comments)    SEASONAL ALLERGIES     Immunization History  Administered Date(s) Administered   Fluad Quad(high Dose 65+) 11/29/2019, 12/07/2020, 12/31/2021   Influenza Split 01/17/2016   Influenza, Seasonal, Injecte, Preservative Fre 02/20/2012   Influenza,inj,Quad PF,6+ Mos 11/12/2018   Influenza-Unspecified 04/02/2017, 12/08/2017, 12/05/2022   Moderna Covid-19 Vaccine Bivalent Booster 64yrs & up 01/31/2021   Moderna SARS-COV2 Booster Vaccination 01/13/2020, 09/25/2020   Moderna Sars-Covid-2 Vaccination 04/09/2019, 05/14/2019   Pneumococcal Conjugate-13 10/26/2014   Pneumococcal Polysaccharide-23 11/12/2018   Pneumococcal-Unspecified 09/14/2013   Td 07/18/2011   Unspecified SARS-COV-2 Vaccination 02/19/2022, 12/05/2022    Family History  Problem Relation Age of Onset   Heart disease Mother    Diabetes Mother    Heart disease Father    Stomach cancer Brother    Diabetes Brother    Asthma Maternal Aunt    Colon cancer Neg Hx    Esophageal cancer Neg Hx    Rectal cancer Neg Hx    Colon polyps Neg Hx      Current Outpatient Medications:    acetaminophen (TYLENOL) 500 MG tablet, Take 500-1,000 mg by mouth every 6 (six) hours as needed (pain.)., Disp: , Rfl:    albuterol (VENTOLIN HFA) 108 (90 Base) MCG/ACT inhaler, Inhale 2 puffs into the lungs every 4 (four) hours as needed for wheezing or shortness of breath., Disp: 18 g, Rfl: 6   aspirin EC 81 MG tablet, Take 81 mg by mouth at bedtime., Disp: , Rfl:    atorvastatin (LIPITOR) 40 MG tablet, TAKE 1 TABLET BY MOUTH DAILY, Disp: 100 tablet, Rfl: 2   Bacillus Coagulans-Inulin (PROBIOTIC) 1-250 BILLION-MG CAPS, , Disp: , Rfl:    Calcium Carbonate-Vitamin D (CALCIUM-VITAMIN D3 PO), Take 1 tablet by mouth in the morning and at  bedtime., Disp: , Rfl:    Cyanocobalamin (VITAMIN B-12 PO), Take 1 tablet by mouth in the morning., Disp: , Rfl:    cycloSPORINE (RESTASIS) 0.05 % ophthalmic emulsion, Place 1 drop into both eyes 2 (two) times daily., Disp: , Rfl:    famotidine (PEPCID) 20 MG tablet, TAKE 1 TABLET(20 MG) BY MOUTH AT BEDTIME, Disp: 90 tablet, Rfl: 3   fexofenadine (ALLEGRA) 180 MG tablet, Take 180 mg by mouth as needed for allergies or rhinitis., Disp: , Rfl:    Glucos-Chond-MSM-Bor-D3-Hyalur (MOVE FREE JOINT HEALTH ADV + D PO), , Disp: , Rfl:    levothyroxine (SYNTHROID) 75 MCG tablet, Take 1 tablet (75 mcg total) by mouth daily before breakfast., Disp: 90 tablet, Rfl: 3   losartan (COZAAR) 25 MG tablet, Take 1 tablet (25 mg total) by mouth daily., Disp: 90 tablet, Rfl: 3   losartan (COZAAR) 25 MG tablet, Take 25 mg by mouth daily., Disp: , Rfl:    Multiple Vitamin (MULTI-VITAMINS) TABS, Take 1 tablet by mouth in the morning., Disp: , Rfl:    pantoprazole (PROTONIX) 40 MG tablet, TAKE 1 TABLET BY MOUTH DAILY, Disp: 100 tablet, Rfl: 2   REPATHA SURECLICK 140 MG/ML SOAJ, INJECT 140MG  SUBCUTANEOUSLY  EVERY 2 WEEKS, Disp: 6 mL, Rfl: 3   VAGINAL LUBRICANT VA, Place vaginally., Disp: , Rfl:    valACYclovir (VALTREX) 500 MG tablet, Take 500 mg by mouth See admin instructions. Take 1 tablet (500 mg) by mouth twice daily x 5 day at onset of outbreak., Disp: , Rfl:       Objective:   There were no vitals filed for  this visit.  Estimated body mass index is 24.72 kg/m as calculated from the following:   Height as of 02/18/23: 5\' 4"  (1.626 m).   Weight as of 02/18/23: 144 lb (65.3 kg).  @WEIGHTCHANGE @  There were no vitals filed for this visit.   Physical Exam   General: No distress. Looks well O2 at rest: no Cane present: no Sitting in wheel chair: no Frail: no Obese: no Neuro: Alert and Oriented x 3. GCS 15. Speech normal Psych: Pleasant        Assessment:       ICD-10-CM   1. Dyspnea and  respiratory abnormalities  R06.00    R06.89     2. Scleroderma (HCC)  M34.9          Plan:     Patient Instructions     ICD-10-CM   1. Dyspnea and respiratory abnormalities  R06.00    R06.89     2. Scleroderma (HCC)  M34.9         No ILD on CT June 2023 and Breathing test potentially changed  Nov  2024. But CT chest end  nov 2024 looks fine to DR [facility]. OFficial report is pending.    Plan = do spirometry and dlco in 9 months - await results of CT chest next 2-3 weeks  followup -9 months; 15 min visit   FOLLOWUP Return in about 9 months (around 11/21/2023) for 15 min visit, with Dr Marchelle Gearing, Face to Face Visit.    SIGNATURE    Dr. Kalman Shan, M.D., F.C.C.P,  Pulmonary and Critical Care Medicine Staff Physician, [facility] Director - Interstitial Lung Disease  Program  Pulmonary Fibrosis [facility] at [facility] [place], [place], [address]  Pager: [phone], If no answer or between  15:00h - 7:00h: call [phone] Telephone: [phone]  4:51 PM 02/20/2023

## 2023-02-20 NOTE — Patient Instructions (Addendum)
ICD-10-CM   1. Dyspnea and respiratory abnormalities  R06.00    R06.89     2. Scleroderma (HCC)  M34.9         No ILD on CT June 2023 and Breathing test potentially changed  Nov  2024. But CT chest end  nov 2024 looks fine to DR [facility]. OFficial report is pending.    Plan = do spirometry and dlco in 9 months - await results of CT chest next 2-3 weeks  followup -9 months; 15 min visit

## 2023-02-25 ENCOUNTER — Encounter: Payer: Self-pay | Admitting: Family Medicine

## 2023-02-25 NOTE — Progress Notes (Signed)
Result on lung are reassuring as discussed at clinic visit and visual of CT iamges. The liver suggest possible cirrhosis. Could be overcall on CT but talk to PCP Caro Laroche, DO

## 2023-02-26 DIAGNOSIS — G8929 Other chronic pain: Secondary | ICD-10-CM | POA: Diagnosis not present

## 2023-02-26 DIAGNOSIS — M5441 Lumbago with sciatica, right side: Secondary | ICD-10-CM | POA: Diagnosis not present

## 2023-02-28 ENCOUNTER — Encounter: Payer: Self-pay | Admitting: Gastroenterology

## 2023-03-03 ENCOUNTER — Other Ambulatory Visit: Payer: Self-pay

## 2023-03-03 ENCOUNTER — Telehealth: Payer: Self-pay | Admitting: Gastroenterology

## 2023-03-03 DIAGNOSIS — R197 Diarrhea, unspecified: Secondary | ICD-10-CM

## 2023-03-03 DIAGNOSIS — K921 Melena: Secondary | ICD-10-CM

## 2023-03-03 NOTE — Telephone Encounter (Signed)
Inbound call from patient stating she has been having black stool. States since Saturday she has been having diarrhea that is black. Patient is requesting a call to discuss further. Please advise, thank you

## 2023-03-03 NOTE — Telephone Encounter (Signed)
Patient advised. Presently without any loose stools. She will wait on the dicyclomine for now. She will let me know if she feels she needs it.

## 2023-03-03 NOTE — Telephone Encounter (Signed)
Patient had loose stools Friday night 02/28/23. She took Imodium. No bowel movement Saturday. Sunday she had a very dark bowel movement and again today. Nagging abdominal discomfort mostly on her left side, and some sensation of lightheadedness today. No nausea or fever. She has not taken any bismuth, no MV or iron supplements. She does eat beets, but the dark stool is not something she has noticed before.

## 2023-03-03 NOTE — Telephone Encounter (Signed)
Please have patient come in for CBC and BMP to make sure dark stool is not secondary to GI bleed. Stool C.diff and GI path if having persistent diarrhea. Rx Dicylomine 10mg  q8h prn for abd discomfort and bloating.

## 2023-03-04 ENCOUNTER — Other Ambulatory Visit (INDEPENDENT_AMBULATORY_CARE_PROVIDER_SITE_OTHER): Payer: Medicare Other

## 2023-03-04 DIAGNOSIS — K921 Melena: Secondary | ICD-10-CM | POA: Diagnosis not present

## 2023-03-04 DIAGNOSIS — R197 Diarrhea, unspecified: Secondary | ICD-10-CM | POA: Diagnosis not present

## 2023-03-04 LAB — CBC WITH DIFFERENTIAL/PLATELET
Basophils Absolute: 0 10*3/uL (ref 0.0–0.1)
Basophils Relative: 0.6 % (ref 0.0–3.0)
Eosinophils Absolute: 0.1 10*3/uL (ref 0.0–0.7)
Eosinophils Relative: 2.9 % (ref 0.0–5.0)
HCT: 35.7 % — ABNORMAL LOW (ref 36.0–46.0)
Hemoglobin: 12.1 g/dL (ref 12.0–15.0)
Lymphocytes Relative: 32.9 % (ref 12.0–46.0)
Lymphs Abs: 1.6 10*3/uL (ref 0.7–4.0)
MCHC: 34 g/dL (ref 30.0–36.0)
MCV: 97.1 fL (ref 78.0–100.0)
Monocytes Absolute: 0.5 10*3/uL (ref 0.1–1.0)
Monocytes Relative: 9.6 % (ref 3.0–12.0)
Neutro Abs: 2.6 10*3/uL (ref 1.4–7.7)
Neutrophils Relative %: 54 % (ref 43.0–77.0)
Platelets: 231 10*3/uL (ref 150.0–400.0)
RBC: 3.68 Mil/uL — ABNORMAL LOW (ref 3.87–5.11)
RDW: 12.5 % (ref 11.5–15.5)
WBC: 4.7 10*3/uL (ref 4.0–10.5)

## 2023-03-04 LAB — BASIC METABOLIC PANEL
BUN: 16 mg/dL (ref 6–23)
CO2: 23 meq/L (ref 19–32)
Calcium: 9.1 mg/dL (ref 8.4–10.5)
Chloride: 107 meq/L (ref 96–112)
Creatinine, Ser: 0.85 mg/dL (ref 0.40–1.20)
GFR: 68.56 mL/min (ref >60.00)
Glucose, Bld: 95 mg/dL (ref 70–99)
Potassium: 4 meq/L (ref 3.5–5.1)
Sodium: 139 meq/L (ref 135–145)

## 2023-03-05 ENCOUNTER — Telehealth: Payer: Self-pay | Admitting: Gastroenterology

## 2023-03-05 DIAGNOSIS — M5441 Lumbago with sciatica, right side: Secondary | ICD-10-CM | POA: Diagnosis not present

## 2023-03-05 DIAGNOSIS — G8929 Other chronic pain: Secondary | ICD-10-CM | POA: Diagnosis not present

## 2023-03-05 NOTE — Telephone Encounter (Signed)
Inbound call from patient requesting a call to discuss lab work. Please advise.

## 2023-03-06 ENCOUNTER — Encounter: Payer: Self-pay | Admitting: Gastroenterology

## 2023-03-06 NOTE — Telephone Encounter (Signed)
Refer to [employer] 03/06/23. Pt is aware we will contact her once Dr. Lavon Paganini has reviewed results.

## 2023-03-10 ENCOUNTER — Encounter: Payer: Self-pay | Admitting: Gastroenterology

## 2023-03-13 ENCOUNTER — Encounter: Payer: Self-pay | Admitting: Family Medicine

## 2023-03-15 ENCOUNTER — Other Ambulatory Visit: Payer: Self-pay

## 2023-03-15 ENCOUNTER — Emergency Department (HOSPITAL_COMMUNITY): Payer: Medicare Other

## 2023-03-15 ENCOUNTER — Emergency Department (HOSPITAL_COMMUNITY)
Admission: EM | Admit: 2023-03-15 | Discharge: 2023-03-15 | Disposition: A | Discharge status: Home / Self Care | Payer: Medicare Other | Attending: Emergency Medicine | Admitting: Emergency Medicine

## 2023-03-15 ENCOUNTER — Telehealth: Payer: Self-pay | Admitting: Family Medicine

## 2023-03-15 ENCOUNTER — Encounter (HOSPITAL_COMMUNITY): Payer: Self-pay

## 2023-03-15 DIAGNOSIS — J189 Pneumonia, unspecified organism: Secondary | ICD-10-CM | POA: Diagnosis not present

## 2023-03-15 DIAGNOSIS — R0602 Shortness of breath: Secondary | ICD-10-CM | POA: Diagnosis not present

## 2023-03-15 DIAGNOSIS — R0789 Other chest pain: Secondary | ICD-10-CM | POA: Diagnosis not present

## 2023-03-15 DIAGNOSIS — R531 Weakness: Secondary | ICD-10-CM | POA: Diagnosis not present

## 2023-03-15 DIAGNOSIS — Z91040 Latex allergy status: Secondary | ICD-10-CM | POA: Insufficient documentation

## 2023-03-15 DIAGNOSIS — R42 Dizziness and giddiness: Secondary | ICD-10-CM | POA: Diagnosis not present

## 2023-03-15 DIAGNOSIS — Z79899 Other long term (current) drug therapy: Secondary | ICD-10-CM | POA: Diagnosis not present

## 2023-03-15 DIAGNOSIS — Z7982 Long term (current) use of aspirin: Secondary | ICD-10-CM | POA: Insufficient documentation

## 2023-03-15 DIAGNOSIS — R918 Other nonspecific abnormal finding of lung field: Secondary | ICD-10-CM | POA: Diagnosis not present

## 2023-03-15 DIAGNOSIS — I10 Essential (primary) hypertension: Secondary | ICD-10-CM | POA: Diagnosis not present

## 2023-03-15 DIAGNOSIS — R079 Chest pain, unspecified: Secondary | ICD-10-CM | POA: Diagnosis not present

## 2023-03-15 DIAGNOSIS — R519 Headache, unspecified: Secondary | ICD-10-CM | POA: Diagnosis not present

## 2023-03-15 DIAGNOSIS — Z471 Aftercare following joint replacement surgery: Secondary | ICD-10-CM | POA: Diagnosis not present

## 2023-03-15 LAB — BASIC METABOLIC PANEL
Anion gap: 8 (ref 5–15)
BUN: 11 mg/dL (ref 8–23)
CO2: 21 mmol/L — ABNORMAL LOW (ref 22–32)
Calcium: 9.4 mg/dL (ref 8.9–10.3)
Chloride: 108 mmol/L (ref 98–111)
Creatinine, Ser: 0.75 mg/dL (ref 0.44–1.00)
GFR, Estimated: >60 mL/min (ref >60)
Glucose, Bld: 149 mg/dL — ABNORMAL HIGH (ref 70–99)
Potassium: 3.9 mmol/L (ref 3.5–5.1)
Sodium: 137 mmol/L (ref 135–145)

## 2023-03-15 LAB — CBC
HCT: 37 % (ref 36.0–46.0)
Hemoglobin: 12.1 g/dL (ref 12.0–15.0)
MCH: 32 pg (ref 26.0–34.0)
MCHC: 32.7 g/dL (ref 30.0–36.0)
MCV: 97.9 fL (ref 80.0–100.0)
Platelets: 237 10*3/uL (ref 150–400)
RBC: 3.78 MIL/uL — ABNORMAL LOW (ref 3.87–5.11)
RDW: 12.6 % (ref 11.5–15.5)
WBC: 5.2 10*3/uL (ref 4.0–10.5)
nRBC: 0 % (ref 0.0–0.2)

## 2023-03-15 LAB — TROPONIN I (HIGH SENSITIVITY)
Troponin I (High Sensitivity): 9 ng/L (ref <18)
Troponin I (High Sensitivity): 13 ng/L (ref <18)

## 2023-03-15 MED ORDER — LORAZEPAM 2 MG/ML IJ SOLN
1.0000 mg | Freq: Once | INTRAMUSCULAR | Status: AC | PRN
  Administered 2023-03-15: 1 mg via INTRAVENOUS
  Filled 2023-03-15: qty 1

## 2023-03-15 MED ORDER — FAMOTIDINE IN NACL 20-0.9 MG/50ML-% IV SOLN
20.0000 mg | Freq: Once | INTRAVENOUS | Status: AC
  Administered 2023-03-15: 20 mg via INTRAVENOUS
  Filled 2023-03-15: qty 50

## 2023-03-15 MED ORDER — MECLIZINE HCL 25 MG PO TABS
25.0000 mg | ORAL_TABLET | Freq: Once | ORAL | Status: AC
  Administered 2023-03-15: 25 mg via ORAL
  Filled 2023-03-15: qty 1

## 2023-03-15 MED ORDER — MECLIZINE HCL 25 MG PO TABS: 25.0000 mg | ORAL_TABLET | Freq: Three times a day (TID) | ORAL | 0 refills | Status: AC | PRN

## 2023-03-15 MED ORDER — IOHEXOL 350 MG/ML SOLN
75.0000 mL | Freq: Once | INTRAVENOUS | Status: AC | PRN
  Administered 2023-03-15: 75 mL via INTRAVENOUS

## 2023-03-15 MED ORDER — AZITHROMYCIN 250 MG PO TABS: 250.0000 mg | ORAL_TABLET | Freq: Every day | ORAL | 0 refills | Status: DC

## 2023-03-15 NOTE — Discharge Instructions (Signed)
Your MRI is normal today.  Take meclizine as needed for dizziness  Your CT scan showed possible pneumonitis.  I have prescribed azithromycin  See your doctor for follow-up  Return to ER if you have worse dizziness or cough or shortness of breath

## 2023-03-15 NOTE — ED Provider Triage Note (Cosign Needed Addendum)
Emergency Medicine Provider Triage Evaluation Note  Rhonda Brewer , a 72 y.o. female  was evaluated in triage.  Pt complains of dizziness, chest pressure.  Review of Systems  Positive:  Negative:   Physical Exam  BP (!) 140/64 (BP Location: Right Arm)   Pulse 67   Temp 98.1 F (36.7 C)   Resp 14   Ht 5\' 4"  (1.626 m)   Wt 65.3 kg   SpO2 99%   BMI 24.72 kg/m  Gen:   Awake, no distress   Resp:  Normal effort  MSK:   Moves extremities without difficulty  Other:    Medical Decision Making  Medically screening exam initiated at 12:15 PM.  Appropriate orders placed.  Rhonda Brewer was informed that the remainder of the evaluation will be completed by another provider, this initial triage assessment does not replace that evaluation, and the importance of remaining in the ED until their evaluation is complete.  Concerned for vertigo since 03/10/2023. Chest pressure and DOE which patient states is not necessarily new for her. States that chest pressure feels like acid reflux. Also with headache behind eyes. Patient has coughing at baseline from her pulmonary fibrosis.   Denies fever, chest pain, nausea, vomiting, diarrhea, dysuria, hematuria, hematochezia.  No nystagmus.      Dorthy Cooler, [place] 03/15/23 1220

## 2023-03-15 NOTE — ED Provider Notes (Signed)
[place] EMERGENCY DEPARTMENT AT [facility] Provider Note   CSN: [id number] Arrival date & time: 03/15/23  1147     History  Chief Complaint  Patient presents with   Dizziness   Chest Pain    Rhonda Brewer is a 72 y.o. female history of hypertension, here presenting with dizziness and chest pain.  Patient has been having chest pain with exertion for the last 2 to 3 days.  Patient also noted this dizziness.  She states that the room was spinning for several days as well.  She denies any focal weakness or trouble walking.  Patient called her doctor and was sent in for further evaluation.  Patient states that she has a cruise coming up in 2 days and she wants to make sure she is okay.  The history is provided by the patient.       Home Medications Prior to Admission medications   Medication Sig Start Date End Date Taking? Authorizing Provider  acetaminophen (TYLENOL) 500 MG tablet Take 500-1,000 mg by mouth every 6 (six) hours as needed (pain.).    [provider]  albuterol (VENTOLIN HFA) 108 (90 Base) MCG/ACT inhaler Inhale 2 puffs into the lungs every 4 (four) hours as needed for wheezing or shortness of breath. 08/09/21   [name], MD  aspirin EC 81 MG tablet Take 81 mg by mouth at bedtime. 12/05/14   [provider]  atorvastatin (LIPITOR) 40 MG tablet TAKE 1 TABLET BY MOUTH DAILY 01/06/23   Bensimhon, Bevelyn Buckles, MD  Bacillus Coagulans-Inulin (PROBIOTIC) 1-250 BILLION-MG CAPS     [provider]  Calcium Carbonate-Vitamin D (CALCIUM-VITAMIN D3 PO) Take 1 tablet by mouth in the morning and at bedtime.    [provider]  Cyanocobalamin (VITAMIN B-12 PO) Take 1 tablet by mouth in the morning.    [provider]  cycloSPORINE (RESTASIS) 0.05 % ophthalmic emulsion Place 1 drop into both eyes 2 (two) times daily.    [provider]  famotidine (PEPCID) 20 MG tablet TAKE 1 TABLET(20 MG) BY MOUTH AT BEDTIME 09/25/22    Nandigam, Eleonore Chiquito, MD  fexofenadine (ALLEGRA) 180 MG tablet Take 180 mg by mouth as needed for allergies or rhinitis.    [provider]  Glucos-Chond-MSM-Bor-D3-Hyalur (MOVE FREE JOINT HEALTH ADV + D PO)     [provider]  levothyroxine (SYNTHROID) 75 MCG tablet Take 1 tablet (75 mcg total) by mouth daily before breakfast. 09/20/22   Rumball, Darl Householder, DO  losartan (COZAAR) 25 MG tablet Take 1 tablet (25 mg total) by mouth daily. 09/24/22 01/23/23  Bensimhon, Bevelyn Buckles, MD  losartan (COZAAR) 25 MG tablet Take 25 mg by mouth daily.    [provider]  Multiple Vitamin (MULTI-VITAMINS) TABS Take 1 tablet by mouth in the morning.    [provider]  pantoprazole (PROTONIX) 40 MG tablet TAKE 1 TABLET BY MOUTH DAILY 09/16/22   Caro Laroche, DO  REPATHA SURECLICK 140 MG/ML SOAJ INJECT 140MG  SUBCUTANEOUSLY  EVERY 2 WEEKS 12/20/22   Bensimhon, Bevelyn Buckles, MD  VAGINAL LUBRICANT VA Place vaginally.    [provider]  valACYclovir (VALTREX) 500 MG tablet Take 500 mg by mouth See admin instructions. Take 1 tablet (500 mg) by mouth twice daily x 5 day at onset of outbreak.    [provider]      Allergies    Avelox [moxifloxacin hcl in nacl], Latex, Moxifloxacin, and Other    Review of  Systems   Review of Systems  Cardiovascular:  Positive for chest pain.  Neurological:  Positive for dizziness.  All other systems reviewed and are negative.   Physical Exam Updated Vital Signs BP (!) 131/58   Pulse (!) 51   Temp 98.6 F (37 C) (Oral)   Resp 16   Ht 5\' 4"  (1.626 m)   Wt 65.3 kg   SpO2 99%   BMI 24.72 kg/m  Physical Exam Vitals and nursing note reviewed.  Constitutional:      Appearance: She is well-developed.  HENT:     Head: Normocephalic.  Eyes:     Extraocular Movements: Extraocular movements intact.     Pupils: Pupils are equal, round, and reactive to light.  Cardiovascular:     Rate and Rhythm: Normal rate and regular rhythm.      Heart sounds: Normal heart sounds.  Pulmonary:     Effort: Pulmonary effort is normal.     Breath sounds: Normal breath sounds.  Abdominal:     General: Bowel sounds are normal.     Palpations: Abdomen is soft.  Musculoskeletal:        General: Normal range of motion.     Cervical back: Normal range of motion and neck supple.  Skin:    General: Skin is warm.  Neurological:     Mental Status: She is alert.     Comments: Questionable nystagmus to the left.  Normal finger-to-nose bilaterally.  Normal strength bilaterally.  Normal gait.     ED Results / Procedures / Treatments   Labs (all labs ordered are listed, but only abnormal results are displayed) Labs Reviewed  BASIC METABOLIC PANEL - Abnormal; Notable for the following components:      Result Value   CO2 21 (*)    Glucose, Bld 149 (*)    All other components within normal limits  CBC - Abnormal; Notable for the following components:   RBC 3.78 (*)    All other components within normal limits  TROPONIN I (HIGH SENSITIVITY)  TROPONIN I (HIGH SENSITIVITY)    EKG EKG Interpretation Date/Time:  Saturday March 15 2023 11:59:18 EST Ventricular Rate:  68 PR Interval:  148 QRS Duration:  82 QT Interval:  436 QTC Calculation: 463 R Axis:   74  Text Interpretation: Normal sinus rhythm Nonspecific ST abnormality Abnormal ECG When compared with ECG of 24-Sep-2022 11:09, PREVIOUS ECG IS PRESENT Confirmed by Richardean Canal [phone]) on 03/15/2023 4:35:48 PM  Radiology MR BRAIN WO CONTRAST Result Date: 03/15/2023 CLINICAL DATA:  Dizziness, headache, stroke suspected EXAM: MRI HEAD WITHOUT CONTRAST TECHNIQUE: Multiplanar, multiecho pulse sequences of the brain and surrounding structures were obtained without intravenous contrast. COMPARISON:  None Available. FINDINGS: Brain: No restricted diffusion to suggest acute or subacute infarct. No acute hemorrhage, mass, mass effect, or midline shift. No hydrocephalus or extra-axial  collection. Pituitary and craniocervical junction within normal limits. No hemosiderin deposition to suggest remote hemorrhage. Normal cerebral volume. No significant chronic small vessel ischemic disease. Vascular: Normal arterial flow voids. Skull and upper cervical spine: Normal marrow signal. Sinuses/Orbits: Clear paranasal sinuses. No acute finding in the orbits. Status post bilateral lens replacements. Other: The mastoid air cells are well aerated. IMPRESSION: No acute intracranial process. No evidence of acute or subacute infarct. Electronically Signed   By: Wiliam Ke M.D.   On: 03/15/2023 21:51   CT Angio Chest PE W and/or Wo Contrast Result Date: 03/15/2023 CLINICAL DATA:  SOB, weakness, tightness of the chest.  EXAM: CT ANGIOGRAPHY CHEST WITH CONTRAST TECHNIQUE: Multidetector CT imaging of the chest was performed using the standard protocol during bolus administration of intravenous contrast. Multiplanar CT image reconstructions and MIPs were obtained to evaluate the vascular anatomy. RADIATION DOSE REDUCTION: This exam was performed according to the departmental dose-optimization program which includes automated exposure control, adjustment of the mA and/or kV according to patient size and/or use of iterative reconstruction technique. CONTRAST:  75mL OMNIPAQUE IOHEXOL 350 MG/ML SOLN COMPARISON:  02/12/2023. FINDINGS: Cardiovascular: No pulmonary artery filling defects to indicate PE. Cardiomegaly. No aortic aneurysm. Atheromatous calcifications aorta and coronary arteries. Reflux contrast into the IVC consistent with tricuspid insufficiency. Mediastinum/Nodes: No suspicious mediastinal adenopathy. Diminutive thyroid. Unremarkable tracheobronchial tree and esophagus. Lungs/Pleura: Patchy ground-glass opacities consistent with pneumonitis or edema. No focal consolidation. No pneumothorax or pleural effusion. Upper Abdomen: No acute abnormality. Musculoskeletal: Thoracic degenerative changes. Review  of the MIP images confirms the above findings. IMPRESSION: 1. No evidence of PE. 2. Cardiomegaly. 3. Patchy ground-glass opacities consistent with pneumonitis or edema. Electronically Signed   By: Layla Maw M.D.   On: 03/15/2023 19:13   DG Chest 2 View Addendum Date: 03/15/2023 ADDENDUM REPORT: 03/15/2023 14:57 ADDENDUM: Upon further review there is hazy rounded opacity projecting over the anterior heart on lateral view, which is favored to reflect superimposition of pericardial fat. Otherwise, no focal abnormality was present on recent CT chest dated 02/12/2023. Electronically Signed   By: Agustin Cree M.D.   On: 03/15/2023 14:57   Result Date: 03/15/2023 CLINICAL DATA:  Chest pain EXAM: CHEST - 2 VIEW COMPARISON:  Chest radiograph dated 09/22/2022 FINDINGS: Normal lung volumes. No focal consolidations. No pleural effusion or pneumothorax. The heart size and mediastinal contours are within normal limits. No acute osseous abnormality. IMPRESSION: No active cardiopulmonary disease. Electronically Signed: By: Agustin Cree M.D. On: 03/15/2023 13:56    Procedures Procedures    Medications Ordered in ED Medications  meclizine (ANTIVERT) tablet 25 mg (25 mg Oral Given 03/15/23 1712)  famotidine (PEPCID) IVPB 20 mg premix (0 mg Intravenous Stopped 03/15/23 1832)  LORazepam (ATIVAN) injection 1 mg (1 mg Intravenous Given 03/15/23 2120)  iohexol (OMNIPAQUE) 350 MG/ML injection 75 mL (75 mLs Intravenous Contrast Given 03/15/23 1845)    ED Course/ Medical Decision Making/ A&P                                 Medical Decision Making Rhonda Brewer is a 72 y.o. female here presenting with dizziness and chest pain.  Chest pain going on for several days it is worse with exertion and she has risk factors for ACS.  Will get troponin x 2.  Patient dizziness is likely peripheral vertigo but given her age and risk factors, will get MRI to rule out stroke.  10:13 PM I reviewed patient's labs and  independently interpreted imaging studies.  Chest x-ray showed possible pericardial fat pad.  CT chest showed no pericardial effusion but she does have possible pneumonitis.  MRI brain showed no stroke.  Will discharge home with Z-Pak and meclizine as needed  Problems Addressed: Community acquired pneumonia, unspecified laterality: acute illness or injury Vertigo: acute illness or injury  Amount and/or Complexity of Data Reviewed Radiology: ordered and independent interpretation performed. Decision-making details documented in ED Course.  Risk Prescription drug management.    Final Clinical Impression(s) / ED Diagnoses Final diagnoses:  None    Rx / DC Orders ED  Discharge Orders     None         Charlynne Pander, MD 03/15/23 2220

## 2023-03-15 NOTE — Telephone Encounter (Signed)
After-hours call  Patient reports 3-4 day history of persistent dizziness.  It occurs all the time and is not only with head movement or standing up.  Has also had headaches.  She has taken over-the-counter pain medicines which helped the headache.  She also endorses shortness of breath and mild chest pain that gets worse with activity.  Blood pressure this morning was in 120s systolic.  She has a history of hypertension (though blood pressure has been soft at recent office visits), CAD status post stenting 2017, crest syndrome, HLD.  Symptoms could be due to orthostatic hypotension versus BPPV.  However, given her persistent symptoms of dizziness, headaches, shortness of breath, and mild chest pain that worsens with activity, especially in the context of her cardiac history, advised patient should seek care at urgent care or the ED for further assessment of a cardiac or neurological source of symptoms.  She states she has family that can help her with transportation. Patient amenable to plan and appreciative for assistance.

## 2023-03-15 NOTE — ED Triage Notes (Signed)
Christmas eve started having dizziness like the room is spinning.  Reports it has not gone away and now having chest pressure which she thought was heartburn but has not subsided.  Patient reports increased SOB with exertion.  Denies pain radiating anywhere.  Reports dizziness is worse with quick movements or changing positions.

## 2023-03-25 ENCOUNTER — Telehealth: Payer: Self-pay | Admitting: *Deleted

## 2023-03-25 NOTE — Progress Notes (Signed)
 Transition Care Management Unsuccessful Follow-up Telephone Call  Date of discharge and from where:  [facility] [place]. [facility]  03/15/2023  Attempts:  1st Attempt  Reason for unsuccessful TCM follow-up call:  Left voice message

## 2023-03-26 ENCOUNTER — Telehealth: Payer: Self-pay | Admitting: *Deleted

## 2023-03-26 NOTE — Progress Notes (Signed)
 Transition Care Management Unsuccessful Follow-up Telephone Call  Date of discharge and from where:  [facility] [place]. [facility]  03/15/2023  Attempts:  2nd Attempt  Reason for unsuccessful TCM follow-up call:  No answer/busy

## 2023-04-06 ENCOUNTER — Other Ambulatory Visit: Payer: Self-pay | Admitting: Pharmacist

## 2023-04-06 DIAGNOSIS — E785 Hyperlipidemia, unspecified: Secondary | ICD-10-CM

## 2023-04-06 DIAGNOSIS — I2510 Atherosclerotic heart disease of native coronary artery without angina pectoris: Secondary | ICD-10-CM

## 2023-04-21 DIAGNOSIS — L603 Nail dystrophy: Secondary | ICD-10-CM | POA: Diagnosis not present

## 2023-04-21 NOTE — Progress Notes (Signed)
 PCP: Dr. Scarlet Referring: Dr. CANDIE Cockayne Cardiologist: Dr. Cherrie    HPI: Rhonda Brewer is a 73 y.o.woman with h/o scleroderma, CAD s/p stent 9/17 , SVT s/p ablation 2/05 referred by Dr. Cockayne for screening for [facility] in setting of scleroderma.   Previously lived in [place]. 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   In 9/17 had heart cath due to positive stress test. At time had exertional fatigue and dyspnea and arm tingling. No CP.    Cath 11/24/15 LM: mild irregs LAD: 20% mid LCX: mild irreg RCA: mRCA 99% ->Xience Alpine DES 3.5x64mm  Echo 11/22/15: LVEF 55-60% Mild AI. Normal RV. Mild TR.   Echo 08/27/16: EF 60-65%,  RV normal Mild AI. No RV strain or PAH.    She was seen in the HF clinic for initial evaluation in June 2018. It was felt that her dyspnea could be due to Brilinta . She was switched to Plavix . Chest CT was ordered and showed no interstitial lung disease.   Cath 5/19 which showed very mild CAD. No evidence. There was a small step-up in saturations at RA level suggestive of anomalous pulmonary vein or shunt at atrial level   Repeat hi-res CT on 09/07/21. No ILD.  3v coronary calcium   Acute visit 9/23 with CP.  Arranged for cath-->R/LHC (9/23) stable CAD with patent RCA stent and 40% mLAD lesion. EF 60-65%, normal right heart pressures.  Echo 8/24 EF 60-65% RV normal. Small effusion   Here with her husband. Says she developed a cough and SOB in November. Saw Dr. Geronimo. Hires CT with no ILD.Seen in ED 03/15/23 with CP and dizziness.  Hstrop 9 -> 19 CT angio chest no PE. ? Pneumonitis/viral URI. Treated with z-pack. Helped with dizziness. Still has cough. Worse when lying down. Says she can go to the store and walk up steps but notes that she is more SOB. No recurrent CP. No edema or PND. Cough is non-productive. + GERD    Cardiac Studies:  - R/LHC (9/23): stable CAD, patent RCA stent and 40 % mLAD lesion RA 3, PA  21/3 (11), PCWP 4, CO/CI (Fick) 5.4/3.2, PVR 1.3 WU - Echo 2/23 : 55-60% mild MR mild to mod AI  RV normal Personally reviewed - Echo 1/22 EF 55-60% mild MR, mild to mod AI Personally reviewed - PFTs 08/2016 FEV1 2.11 (88%) FVC 2.86 (91%) DLCO 63% - PFTs 12/20 FEV1 2.32 (99%) FVC 2.90 (94%) DLCO 85% - PFTs 12/22 FEV1 2.05L (90%) FVC 2.63L (87%) DLCO 76% - PFTs 6/23  FEV1 2.17 (95%) FVC 2.75 (91%) DLCO 82%  Past Medical History:  Diagnosis Date   Allergy    Arthritis    CAD (coronary artery disease)    a. DES to RCA 11/2015   [facility])    Uterine 2008   Cataract    CHF (congestive heart failure) (HCC)    GERD (gastroesophageal reflux disease)    Heart murmur    Hyperlipidemia    Osteoporosis    Thyroid  disease     Current Outpatient Medications  Medication Sig Dispense Refill   acetaminophen  (TYLENOL ) 500 MG tablet Take 500-1,000 mg by mouth every 6 (six) hours as needed (pain.).     albuterol  (VENTOLIN  HFA) 108 (90 Base) MCG/ACT inhaler Inhale 2 puffs into the lungs every 4 (four) hours as needed for wheezing or shortness of breath. 18 g 6   aspirin  EC 81 MG tablet Take 81 mg  by mouth at bedtime.     atorvastatin  (LIPITOR) 40 MG tablet TAKE 1 TABLET BY MOUTH DAILY 100 tablet 2   Bacillus Coagulans-Inulin (PROBIOTIC) 1-250 BILLION-MG CAPS      Calcium  Carbonate-Vitamin D (CALCIUM -VITAMIN D3 PO) Take 1 tablet by mouth in the morning and at bedtime.     Cyanocobalamin (VITAMIN B-12 PO) Take 1 tablet by mouth in the morning.     cycloSPORINE (RESTASIS) 0.05 % ophthalmic emulsion Place 1 drop into both eyes 2 (two) times daily.     famotidine  (PEPCID ) 20 MG tablet TAKE 1 TABLET(20 MG) BY MOUTH AT BEDTIME 90 tablet 3   fexofenadine (ALLEGRA) 180 MG tablet Take 180 mg by mouth as needed for allergies or rhinitis.     Glucos-Chond-MSM-Bor-D3-Hyalur (MOVE FREE JOINT HEALTH ADV + D PO)      levothyroxine  (SYNTHROID ) 75 MCG tablet Take 1 tablet (75 mcg total) by mouth daily  before breakfast. 90 tablet 3   losartan  (COZAAR ) 25 MG tablet Take 1 tablet (25 mg total) by mouth daily. 90 tablet 3   meclizine  (ANTIVERT ) 25 MG tablet Take 1 tablet (25 mg total) by mouth 3 (three) times daily as needed for dizziness. 20 tablet 0   Multiple Vitamin (MULTI-VITAMINS) TABS Take 1 tablet by mouth in the morning.     pantoprazole  (PROTONIX ) 40 MG tablet TAKE 1 TABLET BY MOUTH DAILY 100 tablet 2   REPATHA  SURECLICK 140 MG/ML SOAJ INJECT 140MG  SUBCUTANEOUSLY  EVERY 2 WEEKS 6 mL 3   VAGINAL LUBRICANT VA Place vaginally.     valACYclovir (VALTREX) 500 MG tablet Take 500 mg by mouth See admin instructions. Take 1 tablet (500 mg) by mouth twice daily x 5 day at onset of outbreak.     No current facility-administered medications for this encounter.     Allergies  Allergen Reactions   Avelox [Moxifloxacin Hcl In Nacl] Other (See Comments)    dizziness   Latex Rash    Mild sensitivity( Rash)per patient-    Moxifloxacin Other (See Comments)   Other Other (See Comments)    SEASONAL ALLERGIES     Social History   Socioeconomic History   Marital status: Married    Spouse name: Rhonda Brewer   Number of children: 2   Years of education: 12   Highest education level: Some college, no degree  Occupational History   Occupation: retired    Comment: research scientist (medical)  Tobacco Use   Smoking status: Never    Passive exposure: Past   Smokeless tobacco: Never  Vaping Use   Vaping status: Never Used  Substance and Sexual Activity   Alcohol use: Yes    Comment: occasional wine   Drug use: No   Sexual activity: Yes    Birth control/protection: Surgical    Comment: hysterectomy  Other Topics Concern   Not on file  Social History Narrative   Lives in home with husband. Daughters live out of state. Step-daughter in [place]. Has 8 grandchildren. Lives in 2 level home but most living is on first floor. Stairs have handrails, no problem with stairs. No grab bars in bathroom. No tripping  hazards. Smoke alarms present.   No pets.    Eats a good variety of foods, husband is a cook. Eats meats, fruits, vegetables. Drinks water mostly. Occasional tea.   Goes to gym 5 times a week. Enjoys reading, sewing, movies. Walking when weather is nice.    Wears seat belt in car.    Social Drivers of  Health   Financial Resource Strain: Low Risk  (01/02/2023)   Overall Financial Resource Strain (CARDIA)    Difficulty of Paying Living Expenses: Not hard at all  Food Insecurity: No Food Insecurity (01/02/2023)   Hunger Vital Sign    Worried About Running Out of Food in the Last Year: Never true    Ran Out of Food in the Last Year: Never true  Transportation Needs: No Transportation Needs (01/02/2023)   PRAPARE - Administrator, Civil Service (Medical): No    Lack of Transportation (Non-Medical): No  Physical Activity: Sufficiently Active (01/02/2023)   Exercise Vital Sign    Days of Exercise per Week: 6 days    Minutes of Exercise per Session: 60 min  Stress: No Stress Concern Present (01/02/2023)   [employer] of Occupational Health - Occupational Stress Questionnaire    Feeling of Stress : Not at all  Social Connections: Unknown (01/02/2023)   Social Connection and Isolation Panel [NHANES]    Frequency of Communication with Friends and Family: More than three times a week    Frequency of Social Gatherings with Friends and Family: More than three times a week    Attends Religious Services: Patient declined    Database Administrator or Organizations: Yes    Attends Engineer, Structural: More than 4 times per year    Marital Status: Married  Catering Manager Violence: Not At Risk (05/10/2022)   Humiliation, Afraid, Rape, and Kick questionnaire    Fear of Current or Ex-Partner: No    Emotionally Abused: No    Physically Abused: No    Sexually Abused: No    Family History  Problem Relation Age of Onset   Heart disease Mother    Diabetes Mother     Heart disease Father    Stomach cancer Brother    Diabetes Brother    Asthma Maternal Aunt    Colon cancer Neg Hx    Esophageal cancer Neg Hx    Rectal cancer Neg Hx    Colon polyps Neg Hx    BP 110/70   Pulse 70   Wt 67.6 kg (149 lb)   SpO2 98%   BMI 25.58 kg/m   Wt Readings from Last 3 Encounters:  04/22/23 67.6 kg (149 lb)  03/15/23 65.3 kg (144 lb)  02/18/23 65.3 kg (144 lb)   PHYSICAL EXAM: General:  Well appearing. No resp difficulty HEENT: normal Neck: supple. no JVD. Carotids 2+ bilat; no bruits. No lymphadenopathy or thryomegaly appreciated. Cor: PMI nondisplaced. Regular rate & rhythm. No rubs, gallops or murmurs. Lungs: clear Abdomen: soft, nontender, nondistended. No hepatosplenomegaly. No bruits or masses. Good bowel sounds. Extremities: no cyanosis, clubbing, rash, edema Neuro: alert & orientedx3, cranial nerves grossly intact. moves all 4 extremities w/o difficulty. Affect pleasant   ReDs: 28%  ASSESSMENT & PLAN:  1. Cough and SOB - unclear etiology - recent chest CT reviewed personally and has new diffuse ground glass abnormality not present on recent hi-res CT felt to be possible pneumonitis - lung water today is normal. (ReDS 28%) as was previous RHC so doubt fluid overload - will check ESR, CRP, ANA and [place]-70 - have d/w Dr. Geronimo who will review CT as well - hold losartan  to see if it helps with cough - if still not improved can consider increasing PPI to treat GERD - will need repeat imaging at some point to ensure resolution  2. CAD  - s/p RCA stent in  11/2015 - cath 5/19 with mild non-obstructive CAD - Cath 9/23 with patent RCA stent, 40% stenosis pLAD  - Continue ASA - Continue atorvastatin  + Repatha .  - LDL 24 (1/24), goal LDL < 70 - doubt recent CP was ischemic with normal hstrop - continue to follow. Can rpeath cath as needed  3. Scleroderma - DLCO mildly reduced on PFTs 6/18. No R heart strain on echo. CT without ILD.  - RHC 5/19  no PAH - PFTs 12/20 with normalization of DLCO.  - No evidence of scleroderma-related lung disease (PAH or fibrosis).  - Echo and PFTs stable 6/23. No sign PAH - RHC 9/23 with normal right heart pressures with small step up between SVC and RA - Echo 8/24 EF 60-65% RV normal. Small effusion Mild AI  - Hi-res CT 11/24. No ILD - Chest CT 12/24 + GGO (see above)  4. Hypertension - Blood pressure well controlled. Continue current regimen.  5. GERD - Had EGD 4/24, has small hiatal hernia - On Pepcid  + Pantoprazole  40 daily - consider increase protonix  to 40 bid if cough not improving  I spent a total of 45 minutes today: 1) reviewing the patient's medical records including previous charts, labs and recent notes from other providers; 2) examining the patient and counseling them on their medical issues/explaining the plan of care; 3) adjusting meds as needed and 4) ordering lab work or other needed tests.     Toribio Fuel, MD  10:17 AM

## 2023-04-22 ENCOUNTER — Encounter (HOSPITAL_COMMUNITY): Payer: Self-pay | Admitting: Internal Medicine

## 2023-04-22 ENCOUNTER — Ambulatory Visit (HOSPITAL_COMMUNITY)
Admission: RE | Admit: 2023-04-22 | Discharge: 2023-04-22 | Disposition: A | Discharge status: Home / Self Care | Payer: Medicare Other | Source: Ambulatory Visit | Attending: Internal Medicine | Admitting: Internal Medicine

## 2023-04-22 VITALS — BP 110/70 | HR 70 | Wt 149.0 lb

## 2023-04-22 DIAGNOSIS — I2510 Atherosclerotic heart disease of native coronary artery without angina pectoris: Secondary | ICD-10-CM

## 2023-04-22 DIAGNOSIS — R059 Cough, unspecified: Secondary | ICD-10-CM | POA: Insufficient documentation

## 2023-04-22 DIAGNOSIS — R053 Chronic cough: Secondary | ICD-10-CM

## 2023-04-22 DIAGNOSIS — I10 Essential (primary) hypertension: Secondary | ICD-10-CM | POA: Diagnosis not present

## 2023-04-22 DIAGNOSIS — M349 Systemic sclerosis, unspecified: Secondary | ICD-10-CM

## 2023-04-22 DIAGNOSIS — R918 Other nonspecific abnormal finding of lung field: Secondary | ICD-10-CM

## 2023-04-22 DIAGNOSIS — Z79899 Other long term (current) drug therapy: Secondary | ICD-10-CM | POA: Diagnosis not present

## 2023-04-22 DIAGNOSIS — K449 Diaphragmatic hernia without obstruction or gangrene: Secondary | ICD-10-CM | POA: Insufficient documentation

## 2023-04-22 DIAGNOSIS — I110 Hypertensive heart disease with heart failure: Secondary | ICD-10-CM | POA: Insufficient documentation

## 2023-04-22 DIAGNOSIS — R0602 Shortness of breath: Secondary | ICD-10-CM | POA: Diagnosis not present

## 2023-04-22 DIAGNOSIS — Z955 Presence of coronary angioplasty implant and graft: Secondary | ICD-10-CM | POA: Insufficient documentation

## 2023-04-22 DIAGNOSIS — K219 Gastro-esophageal reflux disease without esophagitis: Secondary | ICD-10-CM | POA: Insufficient documentation

## 2023-04-22 DIAGNOSIS — M341 CR(E)ST syndrome: Secondary | ICD-10-CM | POA: Diagnosis not present

## 2023-04-22 LAB — BASIC METABOLIC PANEL
Anion gap: 8 (ref 5–15)
BUN: 16 mg/dL (ref 8–23)
CO2: 25 mmol/L (ref 22–32)
Calcium: 9.6 mg/dL (ref 8.9–10.3)
Chloride: 107 mmol/L (ref 98–111)
Creatinine, Ser: 0.81 mg/dL (ref 0.44–1.00)
GFR, Estimated: >60 mL/min (ref >60)
Glucose, Bld: 100 mg/dL — ABNORMAL HIGH (ref 70–99)
Potassium: 4.7 mmol/L (ref 3.5–5.1)
Sodium: 140 mmol/L (ref 135–145)

## 2023-04-22 LAB — CBC
HCT: 40.3 % (ref 36.0–46.0)
Hemoglobin: 13.3 g/dL (ref 12.0–15.0)
MCH: 31.8 pg (ref 26.0–34.0)
MCHC: 33 g/dL (ref 30.0–36.0)
MCV: 96.4 fL (ref 80.0–100.0)
Platelets: 236 10*3/uL (ref 150–400)
RBC: 4.18 MIL/uL (ref 3.87–5.11)
RDW: 11.7 % (ref 11.5–15.5)
WBC: 5.9 10*3/uL (ref 4.0–10.5)
nRBC: 0 % (ref 0.0–0.2)

## 2023-04-22 LAB — SEDIMENTATION RATE: Sed Rate: 9 mm/h (ref 0–22)

## 2023-04-22 LAB — C-REACTIVE PROTEIN: CRP: <0.5 mg/dL (ref <1.0)

## 2023-04-22 NOTE — Progress Notes (Signed)
ReDS Vest / Clip - 04/22/23 1000       ReDS Vest / Clip   Station Marker A    Ruler Value 29    ReDS Value Range Low volume    ReDS Actual Value 28

## 2023-04-22 NOTE — Patient Instructions (Signed)
 Great to see you today!!!  HOLD Losartan  for 1 week, if no improvement in your cough please restart  Labs done today, your results will be available in MyChart, we will contact you for abnormal readings.  Your physician recommends that you schedule a follow-up appointment in: 3 months (May), **PLEASE CALL OUR OFFICE IN MARCH TO SCHEDULE THIS APPOINTMENT  If you have any questions or concerns before your next appointment please send us  a message through [facility] or call our office at [phone].    TO LEAVE A MESSAGE FOR THE NURSE SELECT OPTION 2, PLEASE LEAVE A MESSAGE INCLUDING: YOUR NAME DATE OF BIRTH CALL BACK NUMBER REASON FOR CALL**this is important as we prioritize the call backs  YOU WILL RECEIVE A CALL BACK THE SAME DAY AS LONG AS YOU CALL BEFORE 4:00 PM  At the [facility], you and your health needs are our priority. As part of our continuing mission to provide you with exceptional heart care, we have created designated Provider Care Teams. These Care Teams include your primary Cardiologist (physician) and Advanced Practice Providers (APPs- Physician Assistants and Nurse Practitioners) who all work together to provide you with the care you need, when you need it.   You may see any of the following providers on your designated Care Team at your next follow up: Dr Toribio Fuel Dr Ezra Shuck Dr. Ria Commander Dr. Morene Brownie Amy Lenetta, NP Caffie Shed, [place] [facility] [place], [place] Beckey Coe, NP Jordan Lee, NP Tinnie Redman, PharmD   Please be sure to bring in all your medications bottles to every appointment.    Thank you for choosing [facility]

## 2023-04-23 LAB — ENA+DNA/DS+SJORGEN'S
ENA SM Ab Ser-aCnc: <0.2 AI (ref 0.0–0.9)
Ribonucleic Protein: <0.2 AI (ref 0.0–0.9)
SSA (Ro) (ENA) Antibody, IgG: <0.2 AI (ref 0.0–0.9)
SSB (La) (ENA) Antibody, IgG: <0.2 AI (ref 0.0–0.9)
ds DNA Ab: 3 [IU]/mL (ref 0–9)

## 2023-04-23 LAB — ANTI-SCLERODERMA ANTIBODY: Scleroderma (Scl-70) (ENA) Antibody, IgG: <0.2 AI (ref 0.0–0.9)

## 2023-04-23 LAB — ANA W/REFLEX: Anti Nuclear Antibody (ANA): POSITIVE — AB

## 2023-04-24 ENCOUNTER — Encounter (HOSPITAL_COMMUNITY): Payer: Self-pay | Admitting: Internal Medicine

## 2023-04-29 ENCOUNTER — Encounter (HOSPITAL_COMMUNITY): Payer: Self-pay | Admitting: Internal Medicine

## 2023-04-29 MED ORDER — PANTOPRAZOLE SODIUM 40 MG PO TBEC: 40.0000 mg | DELAYED_RELEASE_TABLET | Freq: Two times a day (BID) | ORAL | 3 refills | Status: DC

## 2023-05-13 ENCOUNTER — Encounter (HOSPITAL_COMMUNITY): Payer: Self-pay | Admitting: Internal Medicine

## 2023-05-20 ENCOUNTER — Encounter: Payer: Self-pay | Admitting: Internal Medicine

## 2023-05-20 NOTE — Telephone Encounter (Signed)
 Called and spoke with patient regard her results, informed  of Dr  Marchelle Gearing recommendation from Ct scan in 12/24. Per Dr Marchelle Gearing  Result on lung are reassuring as discussed at clinic visit and visual of CT iamges. The liver suggest possible cirrhosis. Could be overcall on CT but talk to PCP Caro Laroche, DO , pt said that she will reach to her pcp regarding this

## 2023-05-28 ENCOUNTER — Other Ambulatory Visit: Payer: Self-pay | Admitting: Family Medicine

## 2023-05-28 DIAGNOSIS — Z1231 Encounter for screening mammogram for malignant neoplasm of breast: Secondary | ICD-10-CM | POA: Diagnosis not present

## 2023-06-02 ENCOUNTER — Ambulatory Visit: Payer: Medicare Other

## 2023-06-02 VITALS — Ht 64.0 in | Wt 148.0 lb

## 2023-06-02 DIAGNOSIS — Z0000 Encounter for general adult medical examination without abnormal findings: Secondary | ICD-10-CM

## 2023-06-02 NOTE — Patient Instructions (Signed)
 Ms. Lorge , Thank you for taking time to come for your Medicare Wellness Visit. I appreciate your ongoing commitment to your health goals. Please review the following plan we discussed and let me know if I can assist you in the future.   Referrals/Orders/Follow-Ups/Clinician Recommendations: Yes; Keep maintaining your health by keeping your appointments with Dr. Linwood Dibbles and any specialists that you may see.  Call us if you need anything.  Have a great year!!!!  This is a list of the screening recommended for you and due dates:  Health Maintenance  Topic Date Due   Zoster (Shingles) Vaccine (1 of 2) Never done   DTaP/Tdap/Td vaccine (2 - Tdap) 07/17/2021   COVID-19 Vaccine (6 - Moderna risk 2024-25 season) 06/04/2023   Mammogram  05/26/2024   Medicare Annual Wellness Visit  06/01/2024   Colon Cancer Screening  02/17/2026   Pneumonia Vaccine  Completed   Flu Shot  Completed   DEXA scan (bone density measurement)  Completed   Hepatitis C Screening  Completed   HPV Vaccine  Aged Out    Advanced directives: (In Chart) A copy of your advanced directives are scanned into your chart should your provider ever need it.  Next Medicare Annual Wellness Visit scheduled for next year: Yes

## 2023-06-02 NOTE — Progress Notes (Cosign Needed Addendum)
 Subjective:   Rhonda Brewer is a 73 y.o. who presents for a Medicare Wellness preventive visit.  Visit Complete: Virtual I connected with  [name] on 06/02/23 by a audio enabled telemedicine application and verified that I am speaking with the correct person using two identifiers.  Patient Location: Home  Provider Location: Office/Clinic  I discussed the limitations of evaluation and management by telemedicine. The patient expressed understanding and agreed to proceed.  Vital Signs: Because this visit was a virtual/telehealth visit, some criteria may be missing or patient reported. Any vitals not documented were not able to be obtained and vitals that have been documented are patient reported.  VideoDeclined- This patient declined Librarian, academic. Therefore the visit was completed with audio only.  Persons Participating in Visit: Patient.  AWV Questionnaire: Yes: Patient Medicare AWV questionnaire was completed by the patient on 05/31/2023; I have confirmed that all information answered by patient is correct and no changes since this date.  Cardiac Risk Factors include: advanced age (>33men, >58 women);dyslipidemia;family history of premature cardiovascular disease;hypertension     Objective:    Today's Vitals   06/02/23 1342 06/02/23 1352  Weight: 148 lb (67.1 kg)   Height: 5\' 4"  (1.626 m)   PainSc: 0-No pain 0-No pain   Body mass index is 25.4 kg/m.     06/02/2023    1:44 PM 03/15/2023   12:09 PM 01/06/2023    8:55 AM 12/19/2022    9:25 AM 09/22/2022   10:12 AM 05/10/2022    1:48 PM 12/31/2021    8:46 AM  Advanced Directives  Does Patient Have a Medical Advance Directive? Yes No No No Yes Yes No  Type of Estate agent of [place];Living will    Healthcare Power of [place];Living will Healthcare Power of [place];Living will   Does patient want to make changes to medical advance directive? No - Patient declined      No - Patient declined   Copy of Healthcare Power of Attorney in Chart? Yes - validated most recent copy scanned in chart (See row information)     Yes - validated most recent copy scanned in chart (See row information)   Would patient like information on creating a medical advance directive?  No - Patient declined   No - Patient declined      Current Medications (verified) Outpatient Encounter Medications as of 06/02/2023  Medication Sig   acetaminophen (TYLENOL) 500 MG tablet Take 500-1,000 mg by mouth every 6 (six) hours as needed (pain.).   albuterol (VENTOLIN HFA) 108 (90 Base) MCG/ACT inhaler Inhale 2 puffs into the lungs every 4 (four) hours as needed for wheezing or shortness of breath.   aspirin EC 81 MG tablet Take 81 mg by mouth at bedtime.   atorvastatin (LIPITOR) 40 MG tablet TAKE 1 TABLET BY MOUTH DAILY   Bacillus Coagulans-Inulin (PROBIOTIC) 1-250 BILLION-MG CAPS    Calcium Carbonate-Vitamin D (CALCIUM-VITAMIN D3 PO) Take 1 tablet by mouth in the morning and at bedtime.   Cyanocobalamin (VITAMIN B-12 PO) Take 1 tablet by mouth in the morning.   cycloSPORINE (RESTASIS) 0.05 % ophthalmic emulsion Place 1 drop into both eyes 2 (two) times daily.   famotidine (PEPCID) 20 MG tablet TAKE 1 TABLET(20 MG) BY MOUTH AT BEDTIME   fexofenadine (ALLEGRA) 180 MG tablet Take 180 mg by mouth as needed for allergies or rhinitis.   Glucos-Chond-MSM-Bor-D3-Hyalur (MOVE FREE JOINT HEALTH ADV + D PO)    levothyroxine (SYNTHROID) 75  MCG tablet Take 1 tablet (75 mcg total) by mouth daily before breakfast.   losartan (COZAAR) 25 MG tablet Take 1 tablet (25 mg total) by mouth daily.   meclizine (ANTIVERT) 25 MG tablet Take 1 tablet (25 mg total) by mouth 3 (three) times daily as needed for dizziness.   Multiple Vitamin (MULTI-VITAMINS) TABS Take 1 tablet by mouth in the morning.   pantoprazole (PROTONIX) 40 MG tablet TAKE 1 TABLET BY MOUTH DAILY (Patient taking differently: Take 40 mg by mouth 2 (two)  times daily. Heart doctor increased to 2 tablets per day.)   REPATHA SURECLICK 140 MG/ML SOAJ INJECT 140MG  SUBCUTANEOUSLY  EVERY 2 WEEKS   VAGINAL LUBRICANT VA Place vaginally.   valACYclovir (VALTREX) 500 MG tablet Take 500 mg by mouth See admin instructions. Take 1 tablet (500 mg) by mouth twice daily x 5 day at onset of outbreak.   No facility-administered encounter medications on file as of 06/02/2023.    Allergies (verified) Avelox [moxifloxacin hcl in nacl], Latex, Moxifloxacin, and Other   History: Past Medical History:  Diagnosis Date   Allergy    Arthritis    CAD (coronary artery disease)    a. DES to RCA 11/2015   Cancer [facility])    Uterine 2008   Cataract    CHF (congestive heart failure) (HCC)    GERD (gastroesophageal reflux disease)    Heart murmur    Hyperlipidemia    Osteoporosis    Thyroid disease    Past Surgical History:  Procedure Laterality Date   ABDOMINAL HYSTERECTOMY     APPENDECTOMY     CARDIAC ELECTROPHYSIOLOGY MAPPING AND ABLATION  04/19/2003   CARPAL TUNNEL RELEASE Right 10/15/2017   CARPAL TUNNEL RELEASE Left 06/14/2022   CATARACT EXTRACTION Bilateral    07/2021 R 08/2021 L   COLONOSCOPY     CORONARY ANGIOPLASTY WITH STENT PLACEMENT     DE QUERVAIN'S RELEASE Left 06/14/2022   RIGHT/LEFT HEART CATH AND CORONARY ANGIOGRAPHY [name] 08/14/2017   Procedure: RIGHT/LEFT HEART CATH AND CORONARY ANGIOGRAPHY;  Surgeon: Dolores Patty, MD;  Location: [facility] INVASIVE CV LAB;  Service: Cardiovascular;  Laterality: [name];   RIGHT/LEFT HEART CATH AND CORONARY ANGIOGRAPHY [name] 11/20/2021   Procedure: RIGHT/LEFT HEART CATH AND CORONARY ANGIOGRAPHY;  Surgeon: Dolores Patty, MD;  Location: [facility] INVASIVE CV LAB;  Service: Cardiovascular;  Laterality: [name];   ULTRASOUND GUIDANCE FOR VASCULAR ACCESS  08/14/2017   Procedure: Ultrasound Guidance For Vascular Access;  Surgeon: Dolores Patty, MD;  Location: [facility] INVASIVE CV LAB;  Service: Cardiovascular;;   UPPER GI  ENDOSCOPY  07/15/2022   Family History  Problem Relation Age of Onset   Heart disease Mother    Diabetes Mother    Heart disease Father    Stomach cancer Brother    Diabetes Brother    Asthma Maternal Aunt    Colon cancer Neg Hx    Esophageal cancer Neg Hx    Rectal cancer Neg Hx    Colon polyps Neg Hx    Social History   Socioeconomic History   Marital status: Married    Spouse name: Juliene Pina   Number of children: 2   Years of education: 12   Highest education level: 12th grade  Occupational History   Occupation: retired    Comment: Research scientist (medical)  Tobacco Use   Smoking status: Never    Passive exposure: Past   Smokeless tobacco: Never  Vaping Use   Vaping status: Never Used  Substance and Sexual Activity  Alcohol use: Yes    Comment: occasional wine   Drug use: No   Sexual activity: Yes    Birth control/protection: Surgical    Comment: hysterectomy  Other Topics Concern   Not on file  Social History Narrative   Lives in home with husband. Daughters live out of state. Step-daughter in [place]. Has 8 grandchildren. Lives in 2 level home but most living is on first floor. Stairs have handrails, no problem with stairs. No grab bars in bathroom. No tripping hazards. Smoke alarms present.   No pets.    Eats a good variety of foods, husband is a cook. Eats meats, fruits, vegetables. Drinks water mostly. Occasional tea.   Goes to gym 5 times a week. Enjoys reading, sewing, movies. Walking when weather is nice.    Wears seat belt in car.    Social Drivers of Corporate investment banker Strain: Low Risk  (06/02/2023)   Overall Financial Resource Strain (CARDIA)    Difficulty of Paying Living Expenses: Not hard at all  Food Insecurity: No Food Insecurity (06/02/2023)   Hunger Vital Sign    Worried About Running Out of Food in the Last Year: Never true    Ran Out of Food in the Last Year: Never true  Transportation Needs: No Transportation Needs (06/02/2023)   PRAPARE -  Administrator, Civil Service (Medical): No    Lack of Transportation (Non-Medical): No  Physical Activity: Sufficiently Active (06/02/2023)   Exercise Vital Sign    Days of Exercise per Week: 7 days    Minutes of Exercise per Session: 60 min  Stress: No Stress Concern Present (06/02/2023)   [employer] of Occupational Health - Occupational Stress Questionnaire    Feeling of Stress : Not at all  Social Connections: Unknown (06/02/2023)   Social Connection and Isolation Panel [NHANES]    Frequency of Communication with Friends and Family: More than three times a week    Frequency of Social Gatherings with Friends and Family: More than three times a week    Attends Religious Services: Patient declined    Database administrator or Organizations: Yes    Attends Engineer, structural: More than 4 times per year    Marital Status: Married    Tobacco Counseling Counseling given: Not Answered    Clinical Intake:  Pre-visit preparation completed: Yes  Pain : No/denies pain Pain Score: 0-No pain     BMI - recorded: 25.4 Nutritional Status: BMI 25 -29 Overweight Nutritional Risks: None Diabetes: No  How often do you need to have someone help you when you read instructions, pamphlets, or other written materials from your doctor or pharmacy?: 1 - Never What is the last grade level you completed in school?: HSG  Interpreter Needed?: No  Information entered by :: Lemoyne Nestor N. Jisela Merlino, LPN.   Activities of Daily Living     06/02/2023    1:56 PM 05/31/2023    8:57 AM  In your present state of health, do you have any difficulty performing the following activities:  Hearing? 0 0  Vision? 0 0  Difficulty concentrating or making decisions? 0 0  Walking or climbing stairs? 0 0  Dressing or bathing? 0 0  Doing errands, shopping? 0 0  Preparing Food and eating ? N N  Using the Toilet? N N  In the past six months, have you accidently leaked urine? N N  Do you  have problems with loss of bowel control?  N N  Managing your Medications? N N  Managing your Finances? N N  Housekeeping or managing your Housekeeping? N N    Patient Care Team: Caro Laroche, DO as PCP - General (Family Medicine) Bensimhon, Bevelyn Buckles, MD as PCP - Advanced Heart Failure (Cardiology) Pollyann Savoy, MD as Consulting Physician (Rheumatology) Bensimhon, Bevelyn Buckles, MD as Consulting Physician (Cardiology) Napoleon Form, MD as Consulting Physician (Gastroenterology) Kalman Shan, MD as Consulting Physician (Pulmonary Disease) Sallye Lat, MD as Consulting Physician (Ophthalmology) Elon Spanner Madelaine Etienne, MD as Consulting Physician (Obstetrics and Gynecology)  Indicate any recent Medical Services you may have received from other than [facility] providers in the past year (date may be approximate).     Assessment:   This is a routine wellness examination for [facility].  Hearing/Vision screen Hearing Screening - Comments:: Denies hearing difficulties.   Vision Screening - Comments:: Wears reading glasses - up to date with routine eye exams with Sallye Lat, MD.    Goals Addressed   None    Depression Screen     06/02/2023    1:44 PM 12/19/2022    9:25 AM 05/10/2022    1:20 PM 12/31/2021    8:47 AM 08/07/2021    3:31 PM 12/07/2020    8:50 AM 11/29/2019   10:21 AM  PHQ 2/9 Scores  PHQ - 2 Score 0 0 0 0 0 1 0  PHQ- 9 Score 0 0  0 4 1     Fall Risk     06/02/2023    1:56 PM 05/31/2023    8:57 AM 05/10/2022    1:21 PM 08/07/2021    3:32 PM 11/12/2018    8:30 AM  Fall Risk   Falls in the past year? 0 0 0 0 0  Number falls in past yr: 0 0 0 0   Injury with Fall? 0 0 0 0   Risk for fall due to : No Fall Risks  No Fall Risks    Follow up Falls prevention discussed;Falls evaluation completed  Falls evaluation completed      MEDICARE RISK AT HOME:  Medicare Risk at Home Any stairs in or around the home?: Yes If so, are there any without handrails?:  No Home free of loose throw rugs in walkways, pet beds, electrical cords, etc?: Yes Adequate lighting in your home to reduce risk of falls?: Yes Life alert?: No Use of a cane, walker or w/c?: No Grab bars in the bathroom?: No Shower chair or bench in shower?: No Elevated toilet seat or a handicapped toilet?: Yes  TIMED UP AND GO:  Was the test performed?  No  Cognitive Function: 6CIT completed    06/02/2023    1:45 PM 03/09/2018   10:24 AM  MMSE - Mini Mental State Exam  Not completed: Unable to complete   Orientation to time  5  Orientation to Place  5  Registration  3  Attention/ Calculation  5  Recall  3  Language- name 2 objects  2  Language- repeat  1  Language- follow 3 step command  3  Language- read & follow direction  1  Write a sentence  1  Copy design  1  Total score  30        06/02/2023    1:45 PM 05/10/2022    1:22 PM 03/09/2018   10:25 AM  6CIT Screen  What Year? 0 points 0 points 0 points  What month? 0 points 0 points 0 points  What time? 0 points 0 points 0 points  Count back from 20 0 points 0 points 0 points  Months in reverse 0 points 0 points 0 points  Repeat phrase 0 points 0 points 0 points  Total Score 0 points 0 points 0 points    Immunizations Immunization History  Administered Date(s) Administered   Fluad Quad(high Dose 65+) 11/29/2019, 12/07/2020, 12/31/2021   Influenza Split 01/17/2016   Influenza, Seasonal, Injecte, Preservative Fre 02/20/2012   Influenza,inj,Quad PF,6+ Mos 11/12/2018   Influenza-Unspecified 04/02/2017, 12/08/2017, 12/05/2022   Moderna Covid-19 Vaccine Bivalent Booster 35yrs & up 01/31/2021   Moderna SARS-COV2 Booster Vaccination 01/13/2020, 09/25/2020   Moderna Sars-Covid-2 Vaccination 04/09/2019, 05/14/2019   Pneumococcal Conjugate-13 10/26/2014   Pneumococcal Polysaccharide-23 11/12/2018   Pneumococcal-Unspecified 09/14/2013   Td 07/18/2011   Unspecified SARS-COV-2 Vaccination 02/19/2022, 12/05/2022     Screening Tests Health Maintenance  Topic Date Due   Zoster Vaccines- Shingrix (1 of 2) Never done   DTaP/Tdap/Td (2 - Tdap) 07/17/2021   COVID-19 Vaccine (6 - Moderna risk 2024-25 season) 06/04/2023   MAMMOGRAM  05/26/2024   Medicare Annual Wellness (AWV)  06/01/2024   Colonoscopy  02/17/2026   Pneumonia Vaccine 88+ Years old  Completed   INFLUENZA VACCINE  Completed   DEXA SCAN  Completed   Hepatitis C Screening  Completed   HPV VACCINES  Aged Out    Health Maintenance  Health Maintenance Due  Topic Date Due   Zoster Vaccines- Shingrix (1 of 2) Never done   DTaP/Tdap/Td (2 - Tdap) 07/17/2021   COVID-19 Vaccine (6 - Moderna risk 2024-25 season) 06/04/2023   Health Maintenance Items Addressed: Yes; Patient is overdue for Shingrix, Dtap and Covid vaccines.  Additional Screening:  Vision Screening: Recommended annual ophthalmology exams for early detection of glaucoma and other disorders of the eye.  Dental Screening: Recommended annual dental exams for proper oral hygiene  Community Resource Referral / Chronic Care Management: CRR required this visit?  No   CCM required this visit?  No     Plan:     I have personally reviewed and noted the following in the patient's chart:   Medical and social history Use of alcohol, tobacco or illicit drugs  Current medications and supplements including opioid prescriptions. Patient is not currently taking opioid prescriptions. Functional ability and status Nutritional status Physical activity Advanced directives List of other physicians Hospitalizations, surgeries, and ER visits in previous 12 months Vitals Screenings to include cognitive, depression, and falls Referrals and appointments  In addition, I have reviewed and discussed with patient certain preventive protocols, quality metrics, and best practice recommendations. A written personalized care plan for preventive services as well as general preventive health  recommendations were provided to patient.     Mickeal Needy, LPN   [id number]   After Visit Summary: (MyChart) Due to this being a telephonic visit, the after visit summary with patients personalized plan was offered to patient via MyChart   Notes: Nothing significant to report at this time.

## 2023-06-07 ENCOUNTER — Encounter: Payer: Self-pay | Admitting: Family Medicine

## 2023-06-24 DIAGNOSIS — H43811 Vitreous degeneration, right eye: Secondary | ICD-10-CM | POA: Diagnosis not present

## 2023-06-24 DIAGNOSIS — Z961 Presence of intraocular lens: Secondary | ICD-10-CM | POA: Diagnosis not present

## 2023-06-24 DIAGNOSIS — M069 Rheumatoid arthritis, unspecified: Secondary | ICD-10-CM | POA: Diagnosis not present

## 2023-06-24 DIAGNOSIS — H04123 Dry eye syndrome of bilateral lacrimal glands: Secondary | ICD-10-CM | POA: Diagnosis not present

## 2023-07-14 DIAGNOSIS — R300 Dysuria: Secondary | ICD-10-CM | POA: Diagnosis not present

## 2023-07-15 ENCOUNTER — Telehealth (HOSPITAL_COMMUNITY): Payer: Self-pay | Admitting: Internal Medicine

## 2023-07-15 NOTE — Telephone Encounter (Signed)
 Called to confirm/remind patient of their appointment [facility] on 07/15/23.   Appointment:   [x] Confirmed  [] Left mess   [] No answer/No voice mail  [] VM Full/unable to leave message  [] Phone not in service  Patient reminded to bring all medications and/or complete list.  Confirmed patient has transportation. Gave directions, instructed to utilize valet parking.

## 2023-07-16 ENCOUNTER — Encounter (HOSPITAL_COMMUNITY): Payer: Self-pay | Admitting: Internal Medicine

## 2023-07-16 ENCOUNTER — Ambulatory Visit (HOSPITAL_COMMUNITY)
Admission: RE | Admit: 2023-07-16 | Discharge: 2023-07-16 | Disposition: A | Discharge status: Home / Self Care | Source: Ambulatory Visit | Attending: Internal Medicine | Admitting: Internal Medicine

## 2023-07-16 VITALS — BP 130/70 | HR 67 | Wt 144.2 lb

## 2023-07-16 DIAGNOSIS — E785 Hyperlipidemia, unspecified: Secondary | ICD-10-CM | POA: Diagnosis not present

## 2023-07-16 DIAGNOSIS — I2510 Atherosclerotic heart disease of native coronary artery without angina pectoris: Secondary | ICD-10-CM

## 2023-07-16 DIAGNOSIS — E7849 Other hyperlipidemia: Secondary | ICD-10-CM

## 2023-07-16 DIAGNOSIS — M349 Systemic sclerosis, unspecified: Secondary | ICD-10-CM | POA: Diagnosis not present

## 2023-07-16 LAB — LIPID PANEL
Cholesterol: 120 mg/dL (ref 0–200)
HDL: 81 mg/dL (ref >40)
LDL Cholesterol: 29 mg/dL (ref 0–99)
Total CHOL/HDL Ratio: 1.5 ratio
Triglycerides: 48 mg/dL (ref <150)
VLDL: 10 mg/dL (ref 0–40)

## 2023-07-16 LAB — COMPREHENSIVE METABOLIC PANEL WITH GFR
ALT: 18 U/L (ref 0–44)
AST: 34 U/L (ref 15–41)
Albumin: 4.2 g/dL (ref 3.5–5.0)
Alkaline Phosphatase: 66 U/L (ref 38–126)
Anion gap: 8 (ref 5–15)
BUN: 13 mg/dL (ref 8–23)
CO2: 24 mmol/L (ref 22–32)
Calcium: 10.1 mg/dL (ref 8.9–10.3)
Chloride: 109 mmol/L (ref 98–111)
Creatinine, Ser: 0.92 mg/dL (ref 0.44–1.00)
GFR, Estimated: >60 mL/min (ref >60)
Glucose, Bld: 95 mg/dL (ref 70–99)
Potassium: 4.8 mmol/L (ref 3.5–5.1)
Sodium: 141 mmol/L (ref 135–145)
Total Bilirubin: 0.8 mg/dL (ref 0.0–1.2)
Total Protein: 7 g/dL (ref 6.5–8.1)

## 2023-07-16 NOTE — Progress Notes (Signed)
 PCP: Dr. Bronson Brewer Referring: Dr. Tammy Brewer Cardiologist: Dr. Julane Brewer    HPI: [name] is a 74 y.o.woman with h/o scleroderma, CAD s/p stent 9/17 , SVT s/p ablation 2/05 referred by Dr. Georgiann Brewer for screening for [facility] in setting of scleroderma.   Previously lived in [place]. 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   In 9/17 had heart cath due to positive stress test. At time had exertional fatigue and dyspnea and arm tingling. No CP.    Cath 11/24/15 LM: mild irregs LAD: 20% mid LCX: mild irreg RCA: mRCA 99% ->Xience Alpine DES 3.5x48mm  Echo 11/22/15: LVEF 55-60% Mild AI. Normal RV. Mild TR.   Echo 08/27/16: EF 60-65%,  RV normal Mild AI. No RV strain or PAH.    She was seen in the HF clinic for initial evaluation in June 2018. It was felt that her dyspnea could be due to Brilinta . She was switched to Plavix . Chest CT was ordered and showed no interstitial lung disease.   Cath 5/19 which showed very mild CAD. No evidence. There was a small step-up in saturations at RA level suggestive of anomalous pulmonary vein or shunt at atrial level   Repeat hi-res CT on 09/07/21. No ILD.  3v coronary calcium   Acute visit 9/23 with CP.  Arranged for cath-->R/LHC (9/23) stable CAD with patent RCA stent and 40% mLAD lesion. EF 60-65%, normal right heart pressures.  Echo 8/24 EF 60-65% RV normal. Small effusion   Here  for routine f/u. Remains active going to gym regularly without CP or undue SOB. Did have an episode recently when she was working in the yard and chest felt tight so she stopped and went inside and felt fine.No recurrnce. Compliant with meds.    Cardiac Studies:  - R/LHC (9/23): stable CAD, patent RCA stent and 40 % mLAD lesion RA 3, PA 21/3 (11), PCWP 4, CO/CI (Fick) 5.4/3.2, PVR 1.3 WU - Echo 2/23 : 55-60% mild MR mild to mod AI  RV normal Personally reviewed - Echo 1/22 EF 55-60% mild MR, mild to mod AI Personally reviewed -  PFTs 08/2016 FEV1 2.11 (88%) FVC 2.86 (91%) DLCO 63% - PFTs 12/20 FEV1 2.32 (99%) FVC 2.90 (94%) DLCO 85% - PFTs 12/22 FEV1 2.05L (90%) FVC 2.63L (87%) DLCO 76% - PFTs 6/23  FEV1 2.17 (95%) FVC 2.75 (91%) DLCO 82%  Past Medical History:  Diagnosis Date   Allergy    Arthritis    CAD (coronary artery disease)    a. DES to RCA 11/2015   Cancer [facility])    Uterine 2008   Cataract    CHF (congestive heart failure) (HCC)    GERD (gastroesophageal reflux disease)    Heart murmur    Hyperlipidemia    Osteoporosis    Thyroid  disease     Current Outpatient Medications  Medication Sig Dispense Refill   acetaminophen  (TYLENOL ) 500 MG tablet Take 500-1,000 mg by mouth every 6 (six) hours as needed (pain.).     albuterol  (VENTOLIN  HFA) 108 (90 Base) MCG/ACT inhaler Inhale 2 puffs into the lungs every 4 (four) hours as needed for wheezing or shortness of breath. 18 g 6   aspirin  EC 81 MG tablet Take 81 mg by mouth at bedtime.     atorvastatin  (LIPITOR) 40 MG tablet TAKE 1 TABLET BY MOUTH DAILY 100 tablet 2   Bacillus Coagulans-Inulin (PROBIOTIC) 1-250 BILLION-MG CAPS      Calcium  Carbonate-Vitamin D (CALCIUM -VITAMIN  D3 PO) Take 1 tablet by mouth in the morning and at bedtime.     Cyanocobalamin (VITAMIN B-12 PO) Take 1 tablet by mouth in the morning.     cycloSPORINE (RESTASIS) 0.05 % ophthalmic emulsion Place 1 drop into both eyes 2 (two) times daily.     famotidine  (PEPCID ) 20 MG tablet TAKE 1 TABLET(20 MG) BY MOUTH AT BEDTIME 90 tablet 3   fexofenadine (ALLEGRA) 180 MG tablet Take 180 mg by mouth as needed for allergies or rhinitis.     Glucos-Chond-MSM-Bor-D3-Hyalur (MOVE FREE JOINT HEALTH ADV + D PO)      levothyroxine  (SYNTHROID ) 75 MCG tablet Take 1 tablet (75 mcg total) by mouth daily before breakfast. 90 tablet 3   losartan  (COZAAR ) 25 MG tablet Take 1 tablet (25 mg total) by mouth daily. 90 tablet 3   Multiple Vitamin (MULTI-VITAMINS) TABS Take 1 tablet by mouth in the  morning.     pantoprazole  (PROTONIX ) 40 MG tablet Take 40 mg by mouth 2 (two) times daily.     REPATHA  SURECLICK 140 MG/ML SOAJ INJECT 140MG  SUBCUTANEOUSLY  EVERY 2 WEEKS 6 mL 3   VAGINAL LUBRICANT VA Place vaginally.     valACYclovir (VALTREX) 500 MG tablet Take 500 mg by mouth See admin instructions. Take 1 tablet (500 mg) by mouth twice daily x 5 day at onset of outbreak.     meclizine  (ANTIVERT ) 25 MG tablet Take 1 tablet (25 mg total) by mouth 3 (three) times daily as needed for dizziness. (Patient not taking: Reported on 07/16/2023) 20 tablet 0   No current facility-administered medications for this encounter.     Allergies  Allergen Reactions   Avelox [Moxifloxacin Hcl In Nacl] Other (See Comments)    dizziness   Latex Rash    Mild sensitivity( Rash)per patient-    Moxifloxacin Other (See Comments)   Other Other (See Comments)    SEASONAL ALLERGIES     Social History   Socioeconomic History   Marital status: Married    Spouse name: Rhonda Brewer   Number of children: 2   Years of education: 12   Highest education level: 12th grade  Occupational History   Occupation: retired    Comment: Research scientist (medical)  Tobacco Use   Smoking status: Never    Passive exposure: Past   Smokeless tobacco: Never  Vaping Use   Vaping status: Never Used  Substance and Sexual Activity   Alcohol use: Yes    Comment: occasional wine   Drug use: No   Sexual activity: Yes    Birth control/protection: Surgical    Comment: hysterectomy  Other Topics Concern   Not on file  Social History Narrative   Lives in home with husband. Daughters live out of state. Step-daughter in [place]. Has 8 grandchildren. Lives in 2 level home but most living is on first floor. Stairs have handrails, no problem with stairs. No grab bars in bathroom. No tripping hazards. Smoke alarms present.   No pets.    Eats a good variety of foods, husband is a cook. Eats meats, fruits, vegetables. Drinks water mostly. Occasional  tea.   Goes to gym 5 times a week. Enjoys reading, sewing, movies. Walking when weather is nice.    Wears seat belt in car.    Social Drivers of Corporate investment banker Strain: Low Risk  (06/02/2023)   Overall Financial Resource Strain (CARDIA)    Difficulty of Paying Living Expenses: Not hard at all  Food Insecurity: No  Food Insecurity (06/02/2023)   Hunger Vital Sign    Worried About Running Out of Food in the Last Year: Never true    Ran Out of Food in the Last Year: Never true  Transportation Needs: No Transportation Needs (06/02/2023)   PRAPARE - Administrator, Civil Service (Medical): No    Lack of Transportation (Non-Medical): No  Physical Activity: Sufficiently Active (06/02/2023)   Exercise Vital Sign    Days of Exercise per Week: 7 days    Minutes of Exercise per Session: 60 min  Stress: No Stress Concern Present (06/02/2023)   [employer] of Occupational Health - Occupational Stress Questionnaire    Feeling of Stress : Not at all  Social Connections: Unknown (06/02/2023)   Social Connection and Isolation Panel [NHANES]    Frequency of Communication with Friends and Family: More than three times a week    Frequency of Social Gatherings with Friends and Family: More than three times a week    Attends Religious Services: Patient declined    Database administrator or Organizations: Yes    Attends Engineer, structural: More than 4 times per year    Marital Status: Married  Catering manager Violence: Not At Risk (06/02/2023)   Humiliation, Afraid, Rape, and Kick questionnaire    Fear of Current or Ex-Partner: No    Emotionally Abused: No    Physically Abused: No    Sexually Abused: No    Family History  Problem Relation Age of Onset   Heart disease Mother    Diabetes Mother    Heart disease Father    Stomach cancer Brother    Diabetes Brother    Asthma Maternal Aunt    Colon cancer Neg Hx    Esophageal cancer Neg Hx    Rectal cancer Neg  Hx    Colon polyps Neg Hx    BP 130/70   Pulse 67   Wt 65.4 kg (144 lb 3.2 oz)   SpO2 97%   BMI 24.75 kg/m   Wt Readings from Last 3 Encounters:  07/16/23 65.4 kg (144 lb 3.2 oz)  06/02/23 67.1 kg (148 lb)  04/22/23 67.6 kg (149 lb)   PHYSICAL EXAM: General:  Well appearing. No resp difficulty HEENT: normal Neck: supple. no JVD. Carotids 2+ bilat; no bruits. No lymphadenopathy or thryomegaly appreciated. Cor: PMI nondisplaced. Regular rate & rhythm. No rubs, gallops or murmurs. Lungs: clear Abdomen: soft, nontender, nondistended. No hepatosplenomegaly. No bruits or masses. Good bowel sounds. Extremities: no cyanosis, clubbing, rash, edema Neuro: alert & orientedx3, cranial nerves grossly intact. moves all 4 extremities w/o difficulty. Affect pleasant  ASSESSMENT & PLAN:  1. CAD  - s/p RCA stent in 11/2015 - cath 5/19 with mild non-obstructive CAD - Cath 9/23 with patent RCA stent, 40% stenosis pLAD  - Continue ASA - Continue atorvastatin  + Repatha .  - LDL 24 (1/24), goal LDL < 70 - Continues to have intermittent CP but doubt anginal. Continue close surveillance - Check labs including CMET and lipids  2. Scleroderma - DLCO mildly reduced on PFTs 6/18. No R heart strain on echo. CT without ILD.  - RHC 5/19 no PAH - PFTs 12/20 with normalization of DLCO.  - No evidence of scleroderma-related lung disease (PAH or fibrosis).  - Echo and PFTs stable. No evidence PAH - RHC 9/23 with normal right heart pressures with small step up between SVC and RA - Echo 8/24 EF 60-65% RV normal. Small effusion Mild  AI  - Hi-res CT 11/24. No ILD - Chest CT 12/24 + GGO (see above) - Continue to surveil with routine echos  3. Hypertension - Blood pressure well controlled. Continue current regimen.\\  4. GERD - Had EGD 4/24, has small hiatal hernia - On Pepcid  + Pantoprazole  40 daily - stable. Can consider Voquenza if breakthrough despite PPI  Jenesys Casseus, MD  11:38 AM

## 2023-07-16 NOTE — Patient Instructions (Signed)
 Great to see you today!!!  Labs done today, your results will be available in MyChart, we will contact you for abnormal readings.  Your physician recommends that you schedule a follow-up appointment in: 6 months with an echocardiogram (October/November), **PLEASE CALL OUR OFFICE IN AUGUST TO SCHEDULE THIS APPOINTMENT  If you have any questions or concerns before your next appointment please send us  a message through [place] or call our office at [phone].    TO LEAVE A MESSAGE FOR THE NURSE SELECT OPTION 2, PLEASE LEAVE A MESSAGE INCLUDING: YOUR NAME DATE OF BIRTH CALL BACK NUMBER REASON FOR CALL**this is important as we prioritize the call backs  YOU WILL RECEIVE A CALL BACK THE SAME DAY AS LONG AS YOU CALL BEFORE 4:00 PM

## 2023-07-22 NOTE — Progress Notes (Deleted)
 Office Visit Note  Patient: Rhonda Brewer             Date of Birth: 1950-09-30           MRN: [id number]             PCP: Kandis Ormond, DO Referring: Kandis Ormond, DO Visit Date: 07/24/2023 Occupation: @GUAROCC @  Subjective:  No chief complaint on file.   History of Present Illness: Rhonda Brewer is a 73 y.o. female ***     Activities of Daily Living:  Patient reports morning stiffness for *** {minute/hour:19697}.   Patient {ACTIONS;DENIES/REPORTS:21021675::"Denies"} nocturnal pain.  Difficulty dressing/grooming: {ACTIONS;DENIES/REPORTS:21021675::"Denies"} Difficulty climbing stairs: {ACTIONS;DENIES/REPORTS:21021675::"Denies"} Difficulty getting out of chair: {ACTIONS;DENIES/REPORTS:21021675::"Denies"} Difficulty using hands for taps, buttons, cutlery, and/or writing: {ACTIONS;DENIES/REPORTS:21021675::"Denies"}  No Rheumatology ROS completed.   PMFS History:  Patient Active Problem List   Diagnosis Date Noted   Strain of lumbar region 12/19/2022   Abnormal liver CT 09/04/2021   Family history of diabetes mellitus (DM) 12/07/2020   De Quervain's tenosynovitis, left 12/07/2020   Genital herpes simplex 09/13/2020   Chronic interstitial cystitis 04/09/2019   Heart disease 01/08/2019   Hypertensive disorder 01/08/2019   Hypothyroidism 01/08/2019   Hyperlipidemia 01/08/2019   Acquired trigger finger of right ring finger 04/27/2018   Carpal tunnel syndrome 05/29/2017   Low back pain without sciatica 04/29/2017   H/O cardiac radiofrequency ablation 06/26/2016   CAD (coronary artery disease), native coronary artery 06/19/2016   History of uterine cancer 06/19/2016   Scleroderma (HCC) 06/16/2016   Osteopenia of multiple sites 06/16/2016   Raynaud's phenomenon 09/22/2012   Arthritis 09/22/2012   CREST syndrome (HCC) 04/08/2011    Past Medical History:  Diagnosis Date   Allergy    Arthritis    CAD (coronary artery disease)    a. DES to RCA 11/2015   Cancer  [facility])    Uterine 2008   Cataract    CHF (congestive heart failure) (HCC)    GERD (gastroesophageal reflux disease)    Heart murmur    Hyperlipidemia    Osteoporosis    Thyroid  disease     Family History  Problem Relation Age of Onset   Heart disease Mother    Diabetes Mother    Heart disease Father    Stomach cancer Brother    Diabetes Brother    Asthma Maternal Aunt    Colon cancer Neg Hx    Esophageal cancer Neg Hx    Rectal cancer Neg Hx    Colon polyps Neg Hx    Past Surgical History:  Procedure Laterality Date   ABDOMINAL HYSTERECTOMY     APPENDECTOMY     CARDIAC ELECTROPHYSIOLOGY MAPPING AND ABLATION  04/19/2003   CARPAL TUNNEL RELEASE Right 10/15/2017   CARPAL TUNNEL RELEASE Left 06/14/2022   CATARACT EXTRACTION Bilateral    07/2021 R 08/2021 L   COLONOSCOPY     CORONARY ANGIOPLASTY WITH STENT PLACEMENT     DE QUERVAIN'S RELEASE Left 06/14/2022   RIGHT/LEFT HEART CATH AND CORONARY ANGIOGRAPHY [name] 08/14/2017   Procedure: RIGHT/LEFT HEART CATH AND CORONARY ANGIOGRAPHY;  Surgeon: Mardell Shade, MD;  Location: [facility] INVASIVE CV LAB;  Service: Cardiovascular;  Laterality: [name];   RIGHT/LEFT HEART CATH AND CORONARY ANGIOGRAPHY [name] 11/20/2021   Procedure: RIGHT/LEFT HEART CATH AND CORONARY ANGIOGRAPHY;  Surgeon: Mardell Shade, MD;  Location: [facility] INVASIVE CV LAB;  Service: Cardiovascular;  Laterality: [name];   ULTRASOUND GUIDANCE FOR VASCULAR ACCESS  08/14/2017   Procedure: Ultrasound Guidance For  Vascular Access;  Surgeon: Mardell Shade, MD;  Location: [facility] INVASIVE CV LAB;  Service: Cardiovascular;;   UPPER GI ENDOSCOPY  07/15/2022   Social History   Social History Narrative   Lives in home with husband. Daughters live out of state. Step-daughter in [place]. Has 8 grandchildren. Lives in 2 level home but most living is on first floor. Stairs have handrails, no problem with stairs. No grab bars in bathroom. No tripping hazards. Smoke alarms present.   No pets.     Eats a good variety of foods, husband is a cook. Eats meats, fruits, vegetables. Drinks water mostly. Occasional tea.   Goes to gym 5 times a week. Enjoys reading, sewing, movies. Walking when weather is nice.    Wears seat belt in car.    Immunization History  Administered Date(s) Administered   Fluad Quad(high Dose 65+) 11/29/2019, 12/07/2020, 12/31/2021   Influenza Split 01/17/2016   Influenza, Seasonal, Injecte, Preservative Fre 02/20/2012   Influenza,inj,Quad PF,6+ Mos 11/12/2018   Influenza-Unspecified 04/02/2017, 12/08/2017, 12/05/2022   Moderna Covid-19 Vaccine Bivalent Booster 49yrs & up 01/31/2021   Moderna SARS-COV2 Booster Vaccination 01/13/2020, 09/25/2020   Moderna Sars-Covid-2 Vaccination 04/09/2019, 05/14/2019   Pneumococcal Conjugate-13 10/26/2014   Pneumococcal Polysaccharide-23 11/12/2018   Pneumococcal-Unspecified 09/14/2013   Td 07/18/2011   Unspecified SARS-COV-2 Vaccination 02/19/2022, 12/05/2022     Objective: Vital Signs: There were no vitals taken for this visit.   Physical Exam   Musculoskeletal Exam: ***  CDAI Exam: CDAI Score: -- Patient Global: --; Provider Global: -- Swollen: --; Tender: -- Joint Exam 07/24/2023   No joint exam has been documented for this visit   There is currently no information documented on the homunculus. Go to the Rheumatology activity and complete the homunculus joint exam.  Investigation: No additional findings.  Imaging: No results found.  Recent Labs: Lab Results  Component Value Date   WBC 5.9 04/22/2023   HGB 13.3 04/22/2023   PLT 236 04/22/2023   NA 141 07/16/2023   K 4.8 07/16/2023   CL 109 07/16/2023   CO2 24 07/16/2023   GLUCOSE 95 07/16/2023   BUN 13 07/16/2023   CREATININE 0.92 07/16/2023   BILITOT 0.8 07/16/2023   ALKPHOS 66 07/16/2023   AST 34 07/16/2023   ALT 18 07/16/2023   PROT 7.0 07/16/2023   ALBUMIN 4.2 07/16/2023   CALCIUM  10.1 07/16/2023   GFRAA 78 07/11/2020    Speciality  Comments: PLQ Eye Exam: 08/07/17 PLQ toxcity,  PLQ discotinued @ [facility]  Procedures:  No procedures performed Allergies: Avelox [moxifloxacin hcl in nacl], Latex, Moxifloxacin, and Other   Assessment / Plan:     Visit Diagnoses: No diagnosis found.  Orders: No orders of the defined types were placed in this encounter.  No orders of the defined types were placed in this encounter.   Face-to-face time spent with patient was *** minutes. Greater than 50% of time was spent in counseling and coordination of care.  Follow-Up Instructions: No follow-ups on file.   Dee Farber, CMA  Note - This record has been created using Animal nutritionist.  Chart creation errors have been sought, but may not always  have been located. Such creation errors do not reflect on  the standard of medical care.

## 2023-07-24 ENCOUNTER — Ambulatory Visit: Payer: Medicare Other | Admitting: Rheumatology

## 2023-07-24 DIAGNOSIS — Z9889 Other specified postprocedural states: Secondary | ICD-10-CM

## 2023-07-24 DIAGNOSIS — Z8639 Personal history of other endocrine, nutritional and metabolic disease: Secondary | ICD-10-CM

## 2023-07-24 DIAGNOSIS — Z8719 Personal history of other diseases of the digestive system: Secondary | ICD-10-CM

## 2023-07-24 DIAGNOSIS — M349 Systemic sclerosis, unspecified: Secondary | ICD-10-CM

## 2023-07-24 DIAGNOSIS — I7300 Raynaud's syndrome without gangrene: Secondary | ICD-10-CM

## 2023-07-24 DIAGNOSIS — Z79899 Other long term (current) drug therapy: Secondary | ICD-10-CM

## 2023-07-24 DIAGNOSIS — G8929 Other chronic pain: Secondary | ICD-10-CM

## 2023-07-24 DIAGNOSIS — Z8542 Personal history of malignant neoplasm of other parts of uterus: Secondary | ICD-10-CM

## 2023-07-24 DIAGNOSIS — Z8679 Personal history of other diseases of the circulatory system: Secondary | ICD-10-CM

## 2023-07-24 DIAGNOSIS — E785 Hyperlipidemia, unspecified: Secondary | ICD-10-CM

## 2023-07-24 DIAGNOSIS — M8589 Other specified disorders of bone density and structure, multiple sites: Secondary | ICD-10-CM

## 2023-07-24 NOTE — Progress Notes (Signed)
 Office Visit Note  Patient: Rhonda Brewer             Date of Birth: 1950/04/13           MRN: [id number]             PCP: Kandis Ormond, DO Referring: Kandis Ormond, DO Visit Date: 07/31/2023 Occupation: @GUAROCC @  Subjective:  Increased reflux symptoms  History of Present Illness: Rhonda Brewer is a 73 y.o. female with limited systemic sclerosis.  She returns today after her last visit in November 2024.  She continues to have some Raynauds symptoms.  She denies any digital ulcers or  increased skin tightness.  She denies any increased shortness of breath or palpitations.  She continues to have some reflux symptoms.  She denies any episodes of angina.  Patient was evaluated by Dr. Julane Ny on July 16, 2023.  Her right heart cath September 2023 showed normal right heart pressures and echocardiogram August 2024 ejection fraction was normal.  There was no ILD on high-resolution CT November 2024.  He has been monitoring her for coronary artery disease.  She is on Repatha  and atorvastatin .  She has been experiencing increased reflux symptoms.  She is increased Pepcid  and pantoprazole  dose.  She has an appointment coming up with Dr. Marcheta Seta.  Her last visit with Dr. Marcheta Seta was on January 21, 2023.[name]  From August 2024 was negative for pulmonary hypertension.  Activities of Daily Living:  Patient reports morning stiffness for 5 minutes.   Patient Denies nocturnal pain.  Difficulty dressing/grooming: Denies Difficulty climbing stairs: Denies Difficulty getting out of chair: Denies Difficulty using hands for taps, buttons, cutlery, and/or writing: Denies  Review of Systems  Constitutional:  Negative for fatigue.  HENT:  Positive for mouth dryness. Negative for mouth sores.   Eyes:  Positive for dryness.  Respiratory:  Negative for shortness of breath.   Cardiovascular:  Negative for chest pain and palpitations.  Gastrointestinal:  Positive for constipation. Negative for blood  in stool and diarrhea.  Endocrine: Positive for increased urination.  Genitourinary:  Positive for painful urination. Negative for involuntary urination.  Musculoskeletal:  Positive for morning stiffness. Negative for joint pain, gait problem, joint pain, joint swelling, myalgias, muscle weakness, muscle tenderness and myalgias.  Skin:  Negative for color change, rash, hair loss and sensitivity to sunlight.  Allergic/Immunologic: Negative for susceptible to infections.  Neurological:  Negative for dizziness and headaches.  Hematological:  Negative for swollen glands.  Psychiatric/Behavioral:  Positive for sleep disturbance. Negative for depressed mood. The patient is not nervous/anxious.     PMFS History:  Patient Active Problem List   Diagnosis Date Noted   Strain of lumbar region 12/19/2022   Abnormal liver CT 09/04/2021   Family history of diabetes mellitus (DM) 12/07/2020   De Quervain's tenosynovitis, left 12/07/2020   Genital herpes simplex 09/13/2020   Chronic interstitial cystitis 04/09/2019   Heart disease 01/08/2019   Hypertensive disorder 01/08/2019   Hypothyroidism 01/08/2019   Hyperlipidemia 01/08/2019   Acquired trigger finger of right ring finger 04/27/2018   Carpal tunnel syndrome 05/29/2017   Low back pain without sciatica 04/29/2017   H/O cardiac radiofrequency ablation 06/26/2016   CAD (coronary artery disease), native coronary artery 06/19/2016   History of uterine cancer 06/19/2016   Scleroderma (HCC) 06/16/2016   Osteopenia of multiple sites 06/16/2016   Raynaud's phenomenon 09/22/2012   Arthritis 09/22/2012   CREST syndrome (HCC) 04/08/2011    Past Medical History:  Diagnosis Date  Allergy    Arthritis    CAD (coronary artery disease)    a. DES to RCA 11/2015   Cancer [facility])    Uterine 2008   Cataract    CHF (congestive heart failure) (HCC)    GERD (gastroesophageal reflux disease)    Heart murmur    Hyperlipidemia    Osteoporosis    Thyroid   disease     Family History  Problem Relation Age of Onset   Heart disease Mother    Diabetes Mother    Heart disease Father    Stomach cancer Brother    Diabetes Brother    Asthma Maternal Aunt    Colon cancer Neg Hx    Esophageal cancer Neg Hx    Rectal cancer Neg Hx    Colon polyps Neg Hx    Past Surgical History:  Procedure Laterality Date   ABDOMINAL HYSTERECTOMY     APPENDECTOMY     CARDIAC ELECTROPHYSIOLOGY MAPPING AND ABLATION  04/19/2003   CARPAL TUNNEL RELEASE Right 10/15/2017   CARPAL TUNNEL RELEASE Left 06/14/2022   CATARACT EXTRACTION Bilateral    07/2021 R 08/2021 L   COLONOSCOPY     CORONARY ANGIOPLASTY WITH STENT PLACEMENT     DE QUERVAIN'S RELEASE Left 06/14/2022   RIGHT/LEFT HEART CATH AND CORONARY ANGIOGRAPHY [name] 08/14/2017   Procedure: RIGHT/LEFT HEART CATH AND CORONARY ANGIOGRAPHY;  Surgeon: Mardell Shade, MD;  Location: [facility] INVASIVE CV LAB;  Service: Cardiovascular;  Laterality: [name];   RIGHT/LEFT HEART CATH AND CORONARY ANGIOGRAPHY [name] 11/20/2021   Procedure: RIGHT/LEFT HEART CATH AND CORONARY ANGIOGRAPHY;  Surgeon: Mardell Shade, MD;  Location: [facility] INVASIVE CV LAB;  Service: Cardiovascular;  Laterality: [name];   ULTRASOUND GUIDANCE FOR VASCULAR ACCESS  08/14/2017   Procedure: Ultrasound Guidance For Vascular Access;  Surgeon: Mardell Shade, MD;  Location: [facility] INVASIVE CV LAB;  Service: Cardiovascular;;   UPPER GI ENDOSCOPY  07/15/2022   Social History   Social History Narrative   Lives in home with husband. Daughters live out of state. Step-daughter in [place]. Has 8 grandchildren. Lives in 2 level home but most living is on first floor. Stairs have handrails, no problem with stairs. No grab bars in bathroom. No tripping hazards. Smoke alarms present.   No pets.    Eats a good variety of foods, husband is a cook. Eats meats, fruits, vegetables. Drinks water mostly. Occasional tea.   Goes to gym 5 times a week. Enjoys reading, sewing, movies.  Walking when weather is nice.    Wears seat belt in car.    Immunization History  Administered Date(s) Administered   Fluad Quad(high Dose 65+) 11/29/2019, 12/07/2020, 12/31/2021   Influenza Split 01/17/2016   Influenza, Seasonal, Injecte, Preservative Fre 02/20/2012   Influenza,inj,Quad PF,6+ Mos 11/12/2018   Influenza-Unspecified 04/02/2017, 12/08/2017, 12/05/2022   Moderna Covid-19 Vaccine Bivalent Booster 70yrs & up 01/31/2021   Moderna SARS-COV2 Booster Vaccination 01/13/2020, 09/25/2020   Moderna Sars-Covid-2 Vaccination 04/09/2019, 05/14/2019   Pneumococcal Conjugate-13 10/26/2014   Pneumococcal Polysaccharide-23 11/12/2018   Pneumococcal-Unspecified 09/14/2013   Td 07/18/2011   Unspecified SARS-COV-2 Vaccination 02/19/2022, 12/05/2022     Objective: Vital Signs: BP 130/77 (BP Location: Left Arm, Patient Position: Sitting, Cuff Size: Normal)   Pulse 60   Resp 14   Ht 5\' 3"  (1.6 m)   Wt 144 lb 12.8 oz (65.7 kg)   BMI 25.65 kg/m    Physical Exam Vitals and nursing note reviewed.  Constitutional:      Appearance: She  is well-developed.  HENT:     Head: Normocephalic and atraumatic.  Eyes:     Conjunctiva/sclera: Conjunctivae normal.  Cardiovascular:     Rate and Rhythm: Normal rate and regular rhythm.     Heart sounds: Normal heart sounds.  Pulmonary:     Effort: Pulmonary effort is normal.     Breath sounds: Normal breath sounds.  Abdominal:     General: Bowel sounds are normal.     Palpations: Abdomen is soft.  Musculoskeletal:     Cervical back: Normal range of motion.  Lymphadenopathy:     Cervical: No cervical adenopathy.  Skin:    General: Skin is warm and dry.     Capillary Refill: Capillary refill takes 2 to 3 seconds.     Comments: Sclerodactyly was noted distal to PIP joints.  Poikiloderma was noted.  Nailbed capillary dropout was noted.  No digital ulcers were noted.  Telangiectasias were noted on the palmar aspect of both hands.  Skin tightness was  noted on the lower extremities with platelet normal.  Oral pressure was normal.  Neurological:     Mental Status: She is alert and oriented to person, place, and time.  Psychiatric:        Behavior: Behavior normal.      Musculoskeletal Exam: Cervical, thoracic and lumbar spine with good range of motion.  Shoulder joints, elbow joints, wrist joints, MCPs PIPs and DIPs in good range of motion with no synovitis.  Hip joints, knee joints, ankles, MTPs and PIPs with good range of motion with no synovitis.  CDAI Exam: CDAI Score: -- Patient Global: --; Provider Global: -- Swollen: --; Tender: -- Joint Exam 07/31/2023   No joint exam has been documented for this visit   There is currently no information documented on the homunculus. Go to the Rheumatology activity and complete the homunculus joint exam.  Investigation: No additional findings.  Imaging: No results found.  Recent Labs: Lab Results  Component Value Date   WBC 5.9 04/22/2023   HGB 13.3 04/22/2023   PLT 236 04/22/2023   NA 141 07/16/2023   K 4.8 07/16/2023   CL 109 07/16/2023   CO2 24 07/16/2023   GLUCOSE 95 07/16/2023   BUN 13 07/16/2023   CREATININE 0.92 07/16/2023   BILITOT 0.8 07/16/2023   ALKPHOS 66 07/16/2023   AST 34 07/16/2023   ALT 18 07/16/2023   PROT 7.0 07/16/2023   ALBUMIN 4.2 07/16/2023   CALCIUM  10.1 07/16/2023   GFRAA 78 07/11/2020    Speciality Comments: PLQ Eye Exam: 08/07/17 PLQ toxcity,  PLQ discotinued @ [facility]  Procedures:  No procedures performed Allergies: Avelox [moxifloxacin hcl in nacl], Latex, and Other   Assessment / Plan:     Visit Diagnoses: Scleroderma (HCC) - Limited systemic with Raynauds, Telangiectasia, sclerodactyly, arthralgias, erosions in right fifth and left third DIP, ANA centromere:.  She had sclerodactyly in bilateral hands with telangiectasias.  No digital ulcers were noted.  She had decreased capillary refill with nailbed capillary dropout.  Previous  high-resolution CT from February 12, 2023 was negative for ILD.  Coronary artery disease was noted on the CT scan.  Liver also showed mild cirrhotic changes.  She was evaluated by Dr. Julane Ny.  The last echocardiogram  High risk medication use - she is not on any immunosuppressive agents.  Dr. Candi Chafe advised patient to stop taking plaquenil -may 2019.  Raynaud's phenomenon without gangrene-Raynaud's symptoms are mild.  She had no digital ulcers.  Keeping core  temperature warm was discussed.  Chronic pain of left knee-she has noticed improvement in her knee joint pain.  Osteopenia of multiple sites - DEXA 12/07/2019:The BMD measured at Femur Neck Left is 0.827 g/cm2 with a T-score of -1.5.  Repeat DEXA scan was advised.  History of uterine cancer  History of coronary artery disease - status post stent placement.  She is followed by Dr. Julane Ny.  She is on Repatha  and statins.  Blood pressure was normal today.  She was advised to monitor blood pressure closely.  Increased risk of malignant hypertension with the use of steroids was discussed.  History of hypothyroidism  H/O cardiac radiofrequency ablation - Followed by Dr. Julane Ny  Dyslipidemia  History of gastroesophageal reflux (GERD) -she has been experiencing increased reflux symptoms.  She is followed by Dr. Nandigam.  She is on Protonix  40 mg twice daily now and Pepcid  20 mg at bedtime.There is history of gastric cancer in her brother and maternal uncle.  Orders: No orders of the defined types were placed in this encounter.  No orders of the defined types were placed in this encounter.    Follow-Up Instructions: Return in about 5 months (around 12/31/2023) for Scleroderma.   Nicholas Bari, MD  Note - This record has been created using Animal nutritionist.  Chart creation errors have been sought, but may not always  have been located. Such creation errors do not reflect on  the standard of medical care.

## 2023-07-30 ENCOUNTER — Other Ambulatory Visit (HOSPITAL_COMMUNITY): Payer: Self-pay | Admitting: Cardiology

## 2023-07-30 MED ORDER — PANTOPRAZOLE SODIUM 40 MG PO TBEC: 40.0000 mg | DELAYED_RELEASE_TABLET | Freq: Two times a day (BID) | ORAL | 3 refills | Status: AC

## 2023-07-31 ENCOUNTER — Ambulatory Visit: Attending: Rheumatology | Admitting: Rheumatology

## 2023-07-31 ENCOUNTER — Encounter: Payer: Self-pay | Admitting: Rheumatology

## 2023-07-31 VITALS — BP 130/77 | HR 60 | Resp 14 | Ht 63.0 in | Wt 144.8 lb

## 2023-07-31 DIAGNOSIS — Z8639 Personal history of other endocrine, nutritional and metabolic disease: Secondary | ICD-10-CM | POA: Diagnosis not present

## 2023-07-31 DIAGNOSIS — Z8719 Personal history of other diseases of the digestive system: Secondary | ICD-10-CM | POA: Diagnosis not present

## 2023-07-31 DIAGNOSIS — G8929 Other chronic pain: Secondary | ICD-10-CM

## 2023-07-31 DIAGNOSIS — M8589 Other specified disorders of bone density and structure, multiple sites: Secondary | ICD-10-CM

## 2023-07-31 DIAGNOSIS — Z8679 Personal history of other diseases of the circulatory system: Secondary | ICD-10-CM

## 2023-07-31 DIAGNOSIS — M25562 Pain in left knee: Secondary | ICD-10-CM | POA: Diagnosis not present

## 2023-07-31 DIAGNOSIS — Z79899 Other long term (current) drug therapy: Secondary | ICD-10-CM | POA: Diagnosis not present

## 2023-07-31 DIAGNOSIS — I7300 Raynaud's syndrome without gangrene: Secondary | ICD-10-CM | POA: Diagnosis not present

## 2023-07-31 DIAGNOSIS — E785 Hyperlipidemia, unspecified: Secondary | ICD-10-CM

## 2023-07-31 DIAGNOSIS — Z9889 Other specified postprocedural states: Secondary | ICD-10-CM

## 2023-07-31 DIAGNOSIS — M349 Systemic sclerosis, unspecified: Secondary | ICD-10-CM

## 2023-07-31 DIAGNOSIS — R300 Dysuria: Secondary | ICD-10-CM | POA: Diagnosis not present

## 2023-07-31 DIAGNOSIS — Z8542 Personal history of malignant neoplasm of other parts of uterus: Secondary | ICD-10-CM

## 2023-08-06 ENCOUNTER — Other Ambulatory Visit: Payer: Self-pay | Admitting: Family Medicine

## 2023-08-28 ENCOUNTER — Ambulatory Visit: Admitting: Internal Medicine

## 2023-08-28 ENCOUNTER — Encounter: Payer: Self-pay | Admitting: Internal Medicine

## 2023-08-28 VITALS — BP 128/58 | HR 66 | Ht 63.0 in | Wt 145.0 lb

## 2023-08-28 DIAGNOSIS — R918 Other nonspecific abnormal finding of lung field: Secondary | ICD-10-CM | POA: Diagnosis not present

## 2023-08-28 DIAGNOSIS — R0600 Dyspnea, unspecified: Secondary | ICD-10-CM

## 2023-08-28 DIAGNOSIS — M349 Systemic sclerosis, unspecified: Secondary | ICD-10-CM | POA: Diagnosis not present

## 2023-08-28 DIAGNOSIS — R0689 Other abnormalities of breathing: Secondary | ICD-10-CM

## 2023-08-28 NOTE — Progress Notes (Addendum)
 PCP Rhonda Kent, MD   HPI  IOV 08/16/2016  Chief Complaint  Patient presents with   Advice Only    Referred by Dr. Alvira Brewer for scleroderma.  c/o worsening sob, chest tightness with exertion X1 month.    HPI 73 year old woman with history of scleroderma on hydroxychloroquine , CAD s/p stent 11/2015, uterine cancer dx in 2008 s/p hysterectomy, chemo/radtx presenting for evaluation of dyspnea on exertion.  She is followed by Dr. Georgiann Brewer for rheumatology, Dr. Bensimhon for cardiology. Previously was followed by Dr. Rodolfo Brewer in [place]. Last saw Dr. Alvira Brewer 06/19/2016. She has had scleroderma for 10 years and only has been on hydroxychloroquine . She moved to [facility] in January 2018.  For the past month, she has dyspnea with going up one flight of stairs most times. This is new. Also sometimes gets short of breath with bending over to pick something up. Denies cough. She does yoga, strength training, and walking for exercise. She can walk a couple of miles without any issues. She is independent in all her activities of daily living. Does not use any devices to aid in walking. Denies GERD. Has not had a pulmonologist.  She had shortness of breath that prompted the cardiac evaluation leading to stent placement. She has an appointment with cardiology June 12. Review of Systems  S: See resident physician for details. She has scleroderma for over 10 years for which she is on Actonel. Denies any associated acid reflux. Started on was believed only to involve the skin. I personally evaluated the history that this patient had a few months of shortness of breath in 2017 between summer and fall that then resulted in worsening dyspnea on exertion. That then resulted in a cardiac stent. After this dyspnea resolved. Then subsequently in January 2018 moved from the [place] area to [place], [place] . Now for the last 1 month she's having recurrent dyspnea on exertion. She feels this is from  the heart. She does not think is a lung issue. Relieved by rest. She notices it for climbing stairs relieved by rest. She has cardiology appointment pending. There is a pulmonary function test and echocardiogram pending on 08/27/2016. She is reluctant to get a CT chest. Walking desat test in office 185 feet x  3 laps on RA: 100% at rest and exertion  Recent pertinent labs  Results for Rhonda, Brewer (MRN [id number]) as of 08/16/2016 10:29  Ref. Range 06/19/2016 10:38 06/26/2016 10:04  Creatinine Latest Ref Range: 0.50 - 0.99 mg/dL 8.65   Results for Rhonda, Brewer (MRN [id number]) as of 08/16/2016 10:29  Ref. Range 06/19/2016 10:38 06/26/2016 10:04  Hemoglobin Latest Ref Range: 11.7 - 15.5 g/dL 28.4      has a past medical history of Cancer (HCC).   reports that she has never smoked. She has never used smokeless tobacco.   OV 11/05/2016  Chief Complaint  Patient presents with   Follow-up    Pt here after CT and PFT. Pt denies change in SOB since last OV. Pt denies cough, CP/tightness, f/c/s.     Follow-up scleroderma with associated shortness of breath   last seen in June 2018. At that time he thought the suspicion for interstitial lung disease was low. She had Pulm  function test that showed isolated reduction in diffusion capacity to 63%. This raises the possibility of interstitial lung disease but she did have a high-resolution CT scan of the chest that is documented below. Interstitial lung disease has been ruled out. This  no pulmonary parenchymal abnormality. Her hemoglobin was 14.8 g percent suggesting no anemia causing shortness of breath. She then followed up with cardiology. According to the echocardiogram she does not have pulmonary hypertension which can be seen and started on the patient's. She was on  BRILINTA  for CAD - this got changed to Plavix  and her dyspnea resolved. Currently she is doing well does not have any rest symptoms.   Results for Rhonda, Brewer (MRN [id number]) as of  11/05/2016 11:24  Ref. Range 08/27/2016 11:19  FVC-Pre Latest Units: L 2.86  FVC-%Pred-Pre Latest Units: % 91  FEV1-Pre Latest Units: L 2.11  FEV1-%Pred-Pre Latest Units: % 88  Pre FEV1/FVC ratio Latest Units: % 74    Results for Rhonda, Brewer (MRN [id number]) as of 11/05/2016 11:24  Ref. Range 08/27/2016 11:19  TLC Latest Units: L 4.64  TLC % pred Latest Units: % 91  Results for Rhonda, Brewer (MRN [id number]) as of 11/05/2016 11:24  Ref. Range 08/27/2016 11:19  DLCO unc Latest Units: ml/min/mmHg 15.42  DLCO unc % pred Latest Units: % 63   IMPRESSION: 1. No evidence of interstitial lung disease. No acute pulmonary disease . 2. Solitary 3 mm solid apical right upper lobe pulmonary nodule. No follow-up needed if patient is low-risk. Non-contrast chest CT can be considered in 12 months if patient is high-risk. This recommendation follows the consensus statement: Guidelines for Management of Incidental Pulmonary Nodules Detected on CT Images: From the Fleischner Society 2017; Radiology 2017; 284:228-243. 3. Small pericardial effusion/thickening. 4. Three-vessel coronary atherosclerosis.   Aortic Atherosclerosis (ICD10-I70.0).     Electronically Signed   By: Rhonda Brewer M.D.   On: 09/03/2016 15:18  OV 12/16/2017  Subjective:  Patient ID: Rhonda Brewer, female , DOB: Jan 08, 1951 , age 71 y.o. , MRN: [id number] , ADDRESS: [address] Rhonda Brewer [facility] [address]   12/16/2017 -   Chief Complaint  Patient presents with   Follow-up    Pt is here for a 1 year follow up and states she has been doing well. Pt denies any complaints.     HPI Rhonda Brewer 73 y.o. -1 year follow-up for ILD monitoring in the setting of scleroderma.  She says overall she is stable.  She is no longer on Plaquenil  for scleroderma.  She continues to have Raynaud.  She tells me other than extremely mild shortness of breath IV exertion she is stable.  There is no interim medical issues or surgical issues.  No change in  medications of significance.  No ER visits no hospitalizations.  Only interim medical issues carpal tunnel surgery.  She had a CT scan of the chest July 2019.  This is not a high-resolution CT chest.  On the report there is no evidence of ILD.  Last echocardiogram June 2018 without any pulmonary hypertension.  There is no wheezing cough orthopnea proximal nocturnal dyspnea.    OV 08/10/2020  Subjective:  Patient ID: Rhonda Brewer, female , DOB: 10-17-1950 , age 35 y.o. , MRN: [id number] , ADDRESS: [address] Rhonda Brewer [facility] [address] PCP Rhonda Kent, MD Patient Care Team: Rhonda Kent, MD as PCP - General (Family Medicine) Rhonda Brewer, Rheta Celestine, MD as PCP - Advanced Heart Failure (Cardiology) Nicholas Bari, MD as Consulting Physician (Rheumatology) Rhonda Brewer, Rheta Celestine, MD as Consulting Physician (Cardiology) Sergio Dandy, MD as Consulting Physician (Gastroenterology) Maire Scot, MD as Consulting Physician (Pulmonary Disease) Devin Foerster, MD as Consulting Physician (Ophthalmology) Finis Hugger Kendrick Pax, MD as Consulting Physician (Obstetrics and Gynecology)  This Provider for this visit: Treatment Team:  Attending Provider: Maire Scot, MD    08/10/2020 -   Chief Complaint  Patient presents with   Follow-up    Pt states she has been doing okay since last visit. Denies any issues with her breathing.   Follow-up scleroderma with very mild shortness of breath.  At risk for ILD.  - 2019 CT without ILD -2020 pulmonary function test with improvement/normal  HPI Rhonda Brewer 73 y.o. -returns for follow-up.  I personally saw her in 2019.  Then after that because of the pandemic I did not see her.  She did see nurse practitioner December 2020.  Pulmonary function test was normal//slightly improved/stable.  She is on expectant follow-up with monitoring.  In April 2022 she is a Dr. D her rheumatologist.  She asked to be established back in pulmonary to  see me.  Patient has continued ongoing issues with sclerodactyly and also Raynaud's.  In January 2022 she had echocardiogram that I visualized the result.  It shows valvular regurgitation is worse but no evidence of pulmonary hypertension.  She has very mild shortness of breath if at all for climbing stairs.  Symptom scores are not detailed below.  Walking desaturation test is stable compared to 3 years ago and normal.   PFT Subjective:  Patient ID: Rhonda Brewer, female , DOB: 02/18/1951 , age 63 y.o. , MRN: [id number] , ADDRESS: [address] Rhonda Brewer [facility] [address] PCP Rhonda Kent, MD Patient Care Team: Rhonda Kent, MD as PCP - General (Family Medicine) Rhonda Brewer, Rheta Celestine, MD as PCP - Advanced Heart Failure (Cardiology) Nicholas Bari, MD as Consulting Physician (Rheumatology) Rhonda Brewer, Rheta Celestine, MD as Consulting Physician (Cardiology) Sergio Dandy, MD as Consulting Physician (Gastroenterology) Maire Scot, MD as Consulting Physician (Pulmonary Disease) Devin Foerster, MD as Consulting Physician (Ophthalmology) Finis Hugger Kendrick Pax, MD as Consulting Physician (Obstetrics and Gynecology)  This Provider for this visit: Treatment Team:  Attending Provider: Maire Scot, MD    03/01/2021 -   Chief Complaint  Patient presents with   Follow-up    PFT performed today.  Pt states she has been doing okay since last visit and denies any complaints.   Follow-up scleroderma with very mild shortness of breath.  At risk for ILD.  - 2019 CT without ILD -2020 pulmonary function test with improvement/normal  HPI Rhonda Brewer 73 y.o. -returns for follow-up.  In the last 6 months she reports no change in her shortness of breath.  She admits to shortness of breath for stairs relieved by rest.  Last visit 2 she had the same thing but she scored 0 but she actually tells me she has shortness of breath with stairs relieved by rest.  There is no associated chest  pain.  She says this is stable without any worsening.  No cough.  She had pulmonary function test today it shows a slight drop in DLCO below 80%.  The first time there is a drop this low.  I did share these results with her.  Last CT scan of the chest was in 2019.  She is willing to have another CT scan of the chest  She is on pulmonary hypertension monitoring through Dr. Bensimhon.  Next echocardiogram was in March 2023 according to history.    CT Chest data  No results found.  OV 09/07/2021  Subjective:  Patient ID: Rhonda Brewer, female , DOB: 1951-02-03 , age 2 y.o. , MRN: [id number] , ADDRESS: [phone]  [place] [address] PCP Bronson Canny Azucena Bollard, MD Patient Care Team: Rhonda Kent, MD as PCP - General (Family Medicine) Rhonda Brewer, Rheta Celestine, MD as PCP - Advanced Heart Failure (Cardiology) Nicholas Bari, MD as Consulting Physician (Rheumatology) Rhonda Brewer, Rheta Celestine, MD as Consulting Physician (Cardiology) Sergio Dandy, MD as Consulting Physician (Gastroenterology) Maire Scot, MD as Consulting Physician (Pulmonary Disease) Devin Foerster, MD as Consulting Physician (Ophthalmology) Finis Hugger Kendrick Pax, MD as Consulting Physician (Obstetrics and Gynecology)  This Provider for this visit: Treatment Team:  Attending Provider: Maire Scot, MD    09/07/2021 -   Chief Complaint  Patient presents with   Follow-up    PFT performed today.  Pt states she has been doing okay since last visit and denies any complaints.   Follow-up scleroderma at risk for ILD.  Normal pulmonary function test.  HPI Rhonda Brewer 73 y.o. -returns for follow-up.  Last visit there was concern that the pulmonary function test was changing.  So we repeated it.  Brought in 67-month interval.  The test is actually normal.  High-resolution CT chest shows that there is no ILD.  She just has planned scarring.  She continues to feel well.  Very minimal to no symptoms.   Echocardiogram is being monitored by cardiology.  She has no new complaints.  No changes to health status.  She is content with the level of monitoring.  We briefly discussed possible enrollment in clinical trials: She might be interested We also discussed joining support group: We gave her the number for Abe Abed the support group leader.  Of note CT scan shows possible cirrhosis of the liver.  Personally visualized the images of the pulmonary function test and CT scan.     OV 01/21/2023  Subjective:  Patient ID: Rhonda Brewer, female , DOB: May 27, 1950 , age 65 y.o. , MRN: [id number] , ADDRESS: [address] Rhonda Brewer [facility] [place] [facility] [id number] PCP Kandis Ormond, DO Patient Care Team: Kandis Ormond, DO as PCP - General (Family Medicine) Rhonda Brewer, Rheta Celestine, MD as PCP - Advanced Heart Failure (Cardiology) Nicholas Bari, MD as Consulting Physician (Rheumatology) Rhonda Brewer, Rheta Celestine, MD as Consulting Physician (Cardiology) Sergio Dandy, MD as Consulting Physician (Gastroenterology) Maire Scot, MD as Consulting Physician (Pulmonary Disease) Devin Foerster, MD as Consulting Physician (Ophthalmology) Finis Hugger Kendrick Pax, MD as Consulting Physician (Obstetrics and Gynecology)  This Provider for this visit: Treatment Team:  Attending Provider: Maire Scot, MD    01/21/2023 -   Chief Complaint  Patient presents with   Follow-up    Pft f/u   Scleroderma without ILD but mild bland scarring on surveillance  HPI Rhonda Brewer 73 y.o. -returns for follow-up.  Last seen in June 2023.  She tells me that since then she has had insidious onset of shortness of breath.  Present walking stairs.  It was always mild but now it is more noticeable.  She is also having mild chronic dry cough at night there is no wheezing.  This cough is also more noticeable.  Definitely symptoms are progressive in the last 6 months.  Symptom score below clearly shows that the symptoms were absent  in 2022 and now present and more prominent.  She had an echocardiogram in August 2024 and it is normal.  She had pulmonary function test today that I reviewed and it shows a decline FVC is now 2.5 L / 86% and DLCO is now 13.98/72%.  There is a change.  I visualized the previous CT scan and agreed with  the previous findings.  I did indicate to her that I am concerned about potential ILD developing.  She also has seasonal allergies we will check a CBC with differential blood IgE She indicated to me she has family Struve diabetes and wants her hemoglobin A1c checked.   Nw   OV 02/20/2023  Subjective:  Patient ID: Rhonda Brewer, female , DOB: 1950-09-13 , age 58 y.o. , MRN: [id number] , ADDRESS: [address] [place] [address] PCP Kandis Ormond, DO Patient Care Team: Kandis Ormond, DO as PCP - General (Family Medicine) Rhonda Brewer, Rheta Celestine, MD as PCP - Advanced Heart Failure (Cardiology) Nicholas Bari, MD as Consulting Physician (Rheumatology) Rhonda Brewer, Rheta Celestine, MD as Consulting Physician (Cardiology) Sergio Dandy, MD as Consulting Physician (Gastroenterology) Maire Scot, MD as Consulting Physician (Pulmonary Disease) Devin Foerster, MD as Consulting Physician (Ophthalmology) Finis Hugger Kendrick Pax, MD as Consulting Physician (Obstetrics and Gynecology)  This Provider for this visit: Treatment Team:  Attending Provider: Maire Scot, MD  Type of visit: Video Virtual Visit Identification of patient Rhonda Brewer with 05/04/1950 and MRN [id number] - 2 person identifier Risks: Risks, benefits, limitations of telephone visit explained. Patient understood and verbalized agreement to proceed Anyone else on call: jus patient Patient location: her home This provider location: [address]; [place]; [place] [address]. [facility]. [phone]     02/20/2023 -  scleroderma patinet    HPI Rhonda Brewer 73 y.o.  -presents for this video visit to discuss test results.  Last visit perhaps she was a little more short of breath but perhaps not but pulmonary function test suggestive of decline.  Therefore we did a blood BNP this is slightly high but otherwise normal.  Hemoglobin A1c was normal.  Anemia test was normal.  Recent echo this year was also normal.  She open overall she is continuing to do stable and no interim complaints.  CT scan visualized personally and there is no evidence of ILD.  I showed this to her and also the June 2023 high-resolution CT chest.  Unfortunately the official report is still pending.  Nevertheless overall because of his symptom stability and no interim issues and in my personal visualization no development of ILD without this was reassuring.  She is going to monitor her symptoms and come back for follow-up in approximately 9 months with a breathing test.     CT Chest data from date: 111/27/24  - personally visualized and independently interpreted : yes - my findings are: no ILD. -official report pending    OV 08/28/2023  Subjective:  Patient ID: Rhonda Brewer, female , DOB: 21-Mar-1950 , age 24 y.o. , MRN: [id number] , ADDRESS: [address] Rhonda Brewer [facility] [address] PCP Kandis Ormond, DO Patient Care Team: Kandis Ormond, DO as PCP - General (Family Medicine) Rhonda Brewer, Rheta Celestine, MD as PCP - Advanced Heart Failure (Cardiology) Nicholas Bari, MD as Consulting Physician (Rheumatology) Rhonda Brewer, Rheta Celestine, MD as Consulting Physician (Cardiology) Sergio Dandy, MD as Consulting Physician (Gastroenterology) Maire Scot, MD as Consulting Physician (Pulmonary Disease) Devin Foerster, MD as Consulting Physician (Ophthalmology) Finis Hugger Kendrick Pax, MD as Consulting Physician (Obstetrics and Gynecology)  This Provider for this visit: Treatment Team:  Attending Provider: Maire Scot, MD    08/28/2023 -   Chief Complaint  Patient presents  with   Follow-up  Scleroderma patient   HPI Rhonda Brewer 73 y.o. -scleroderma patient here for routine follow-up.  I last saw her in November  2020 for and at that time high-resolution CT chest was clear without any ILD.  However March 07, 2023 she ended up in the ED because of atypical chest pain and also vertigo and dizziness and cough.  She ended up getting a CT angiogram chest that ruled out pulmonary embolism but showed significant groundglass opacities.  She was discharged with Z-Pak.  She subsequently did see Dr. Jules Oar 04/22/2023.  At that time she was still having cough that was getting worse when lying down.  She also reported increasing shortness of breath.  She was reporting increased acid reflux no recurrent chest pain no edema.  Dr. Climmie Damme more than doubled up her pantoprazole .  At this point in time she says that her cough is back to baseline significantly improved.  Shortness of breath is stable.  She does have nonspecific fatigue by end of the day.  She saw Dr. Julane Ny April 2025 and told him that she is going to the gym regularly without any chest pain or undue shortness of breath.  She is also followed up 07/31/2023 with rheumatology.  At this point in time she is stable.  She did a sit/stand exercise hypoxemia test.  It was essentially normal.   Given multiple CT scans last had opted for a more conservative line.  Will see her in more close follow-up.  Will get a PFT in September 2025 and if it is stable we will get a high-resolution CT chest in December 2025     New isssue is GGO  SYMPTOM SCALE  08/10/2020  03/01/2021  01/21/2023  08/28/2023   O2 use ra ra ra ra  Shortness of Breath 0 -> 5 scale with 5 being worst (score 6 If unable to do)     At rest 0 0 0 0  Simple tasks - showers, clothes change, eating, shaving 0 0 0 0  Household (dishes, doing bed, laundry) 000 0 1 1  Shopping 0 0 0 0  Walking level at own pace 0 0 0 0  Walking up Stairs 0 1 2 2   Total  (30-36) Dyspnea Score 00 1 3 3   How bad is your cough? 0 0 2 1  How bad is your fatigue 0 0 0 2  How bad is nausea 00 0 0 0  How bad is vomiting?  0 0 0 00  How bad is diarrhea? 0 0 0 0  How bad is anxiety? 0 0 0 0  How bad is depression 0 0 0 0    Simple office walk 185 feet x  3 laps goal with forehead probe 12/16/2017  08/10/2020  01/21/2023  08/28/2023   O2 used Room air ra ra Room air forehead probe  Number laps completed 3 3 Sit stand x 15 Sit stand x 15  Comments about pace Normal brisk avg space  40 seconds  Resting Pulse Ox/HR 99% and 65/min 98% and HR 67 96% and HR 70 98% and heart rate 64  Final Pulse Ox/HR 99% and 74/min 98% and HR 82 95% and HR 77 90% and heart rate 86  Desaturated </= 88% no no  no  Desaturated <= 3% points no no  no  Got Tachycardic >/= 90/min no no  no  Symptoms at end of test non3 No complaints  No complaints no dyspnea at all.  Miscellaneous comments Normal test      CT Chest data from date: dec 2024*  - personally visualized and independently interpreted : yes -  my findings are:  GGO but somewhat false poisitve due to CTA Narrative & Impression  CLINICAL DATA:  SOB, weakness, tightness of the chest.   EXAM: CT ANGIOGRAPHY CHEST WITH CONTRAST   TECHNIQUE: Multidetector CT imaging of the chest was performed using the standard protocol during bolus administration of intravenous contrast. Multiplanar CT image reconstructions and MIPs were obtained to evaluate the vascular anatomy.   RADIATION DOSE REDUCTION: This exam was performed according to the departmental dose-optimization program which includes automated exposure control, adjustment of the mA and/or kV according to patient size and/or use of iterative reconstruction technique.   CONTRAST:  75mL OMNIPAQUE  IOHEXOL  350 MG/ML SOLN   COMPARISON:  02/12/2023.   FINDINGS: Cardiovascular: No pulmonary artery filling defects to indicate PE. Cardiomegaly. No aortic aneurysm. Atheromatous  calcifications aorta and coronary arteries. Reflux contrast into the IVC consistent with tricuspid insufficiency.   Mediastinum/Nodes: No suspicious mediastinal adenopathy. Diminutive thyroid . Unremarkable tracheobronchial tree and esophagus.   Lungs/Pleura: Patchy ground-glass opacities consistent with pneumonitis or edema. No focal consolidation. No pneumothorax or pleural effusion.   Upper Abdomen: No acute abnormality.   Musculoskeletal: Thoracic degenerative changes.   Review of the MIP images confirms the above findings.   IMPRESSION: 1. No evidence of PE. 2. Cardiomegaly. 3. Patchy ground-glass opacities consistent with pneumonitis or edema.     Electronically Signed   By: Sydell Eva M.D.   On: 03/15/2023 19:13    PFT     Latest Ref Rng & Units 01/21/2023   10:55 AM 09/07/2021   11:39 AM 03/01/2021    2:50 PM 03/03/2019    2:00 PM 08/27/2016   11:19 AM  PFT Results  FVC-Pre L 2.54  2.75  2.63  2.90  2.86   FVC-Predicted Pre % 86  91  87  94  91   FVC-Post L  2.69    2.98   FVC-Predicted Post %  89    94   Pre FEV1/FVC % % 74  79  78  80  74   Post FEV1/FCV % %  83    82   FEV1-Pre L 1.88  2.17  2.05  2.32  2.11   FEV1-Predicted Pre % 85  95  90  99  88   FEV1-Post L  2.23    2.45   DLCO uncorrected ml/min/mmHg 13.98  16.20  14.84  16.78  15.42   DLCO UNC% % 72  83  76  85  63   DLCO corrected ml/min/mmHg 13.98  16.10  14.84     DLCO COR %Predicted % 72  82  76     DLVA Predicted % 77  85  85  93  70   TLC L  4.91    4.64   TLC % Predicted %  97    91   RV % Predicted %  93    80        LAB RESULTS last 96 hours No results found.       has a past medical history of Allergy, Arthritis, CAD (coronary artery disease), Cancer (HCC), Cataract, CHF (congestive heart failure) (HCC), GERD (gastroesophageal reflux disease), Heart murmur, Hyperlipidemia, Osteoporosis, and Thyroid  disease.   reports that she has never smoked. She has been exposed to  tobacco smoke. She has never used smokeless tobacco.  Past Surgical History:  Procedure Laterality Date   ABDOMINAL HYSTERECTOMY     APPENDECTOMY     CARDIAC ELECTROPHYSIOLOGY MAPPING AND ABLATION  04/19/2003   CARPAL TUNNEL RELEASE Right 10/15/2017   CARPAL TUNNEL RELEASE Left 06/14/2022   CATARACT EXTRACTION Bilateral    07/2021 R 08/2021 L   COLONOSCOPY     CORONARY ANGIOPLASTY WITH STENT PLACEMENT     DE QUERVAIN'S RELEASE Left 06/14/2022   RIGHT/LEFT HEART CATH AND CORONARY ANGIOGRAPHY [name] 08/14/2017   Procedure: RIGHT/LEFT HEART CATH AND CORONARY ANGIOGRAPHY;  Surgeon: Mardell Shade, MD;  Location: [facility];  Service: Cardiovascular;  Laterality: [name];   RIGHT/LEFT HEART CATH AND CORONARY ANGIOGRAPHY [name] 11/20/2021   Procedure: RIGHT/LEFT HEART CATH AND CORONARY ANGIOGRAPHY;  Surgeon: Mardell Shade, MD;  Location: [facility] INVASIVE CV LAB;  Service: Cardiovascular;  Laterality: [name];   ULTRASOUND GUIDANCE FOR VASCULAR ACCESS  08/14/2017   Procedure: Ultrasound Guidance For Vascular Access;  Surgeon: Mardell Shade, MD;  Location: [facility] INVASIVE CV LAB;  Service: Cardiovascular;;   UPPER GI ENDOSCOPY  07/15/2022    Allergies  Allergen Reactions   Avelox [Moxifloxacin Hcl In Nacl] Other (See Comments)    dizziness   Latex Rash    Mild sensitivity( Rash)per patient-    Other Other (See Comments)    SEASONAL ALLERGIES     Immunization History  Administered Date(s) Administered   Fluad Quad(high Dose 65+) 11/29/2019, 12/07/2020, 12/31/2021   Influenza Split 01/17/2016   Influenza, Seasonal, Injecte, Preservative Fre 02/20/2012   Influenza,inj,Quad PF,6+ Mos 11/12/2018   Influenza-Unspecified 04/02/2017, 12/08/2017, 12/05/2022   Moderna Covid-19 Vaccine Bivalent Booster 19yrs & up 01/31/2021   Moderna SARS-COV2 Booster Vaccination 01/13/2020, 09/25/2020   Moderna Sars-Covid-2 Vaccination 04/09/2019, 05/14/2019   Pneumococcal Conjugate-13 10/26/2014    Pneumococcal Polysaccharide-23 11/12/2018   Pneumococcal-Unspecified 09/14/2013   Td 07/18/2011   Unspecified SARS-COV-2 Vaccination 02/19/2022, 12/05/2022    Family History  Problem Relation Age of Onset   Heart disease Mother    Diabetes Mother    Heart disease Father    Stomach cancer Brother    Diabetes Brother    Asthma Maternal Aunt    Colon cancer Neg Hx    Esophageal cancer Neg Hx    Rectal cancer Neg Hx    Colon polyps Neg Hx      Current Outpatient Medications:    acetaminophen  (TYLENOL ) 500 MG tablet, Take 500-1,000 mg by mouth every 6 (six) hours as needed (pain.)., Disp: , Rfl:    albuterol  (VENTOLIN  HFA) 108 (90 Base) MCG/ACT inhaler, Inhale 2 puffs into the lungs every 4 (four) hours as needed for wheezing or shortness of breath., Disp: 18 g, Rfl: 6   aspirin  EC 81 MG tablet, Take 81 mg by mouth at bedtime., Disp: , Rfl:    atorvastatin  (LIPITOR) 40 MG tablet, TAKE 1 TABLET BY MOUTH DAILY, Disp: 100 tablet, Rfl: 2   Bacillus Coagulans-Inulin (PROBIOTIC) 1-250 BILLION-MG CAPS, , Disp: , Rfl:    Calcium  Carbonate-Vitamin D (CALCIUM -VITAMIN D3 PO), Take 1 tablet by mouth in the morning and at bedtime., Disp: , Rfl:    Cyanocobalamin (VITAMIN B-12 PO), Take 1 tablet by mouth in the morning., Disp: , Rfl:    cycloSPORINE (RESTASIS) 0.05 % ophthalmic emulsion, Place 1 drop into both eyes 2 (two) times daily., Disp: , Rfl:    famotidine  (PEPCID ) 20 MG tablet, TAKE 1 TABLET(20 MG) BY MOUTH AT BEDTIME, Disp: 90 tablet, Rfl: 3   fexofenadine (ALLEGRA) 180 MG tablet, Take 180 mg by mouth as needed for allergies or rhinitis., Disp: , Rfl:    Glucos-Chond-MSM-Bor-D3-Hyalur (MOVE FREE JOINT HEALTH ADV +  D PO), , Disp: , Rfl:    levothyroxine  (SYNTHROID ) 75 MCG tablet, TAKE 1 TABLET BY MOUTH DAILY  BEFORE BREAKFAST, Disp: 100 tablet, Rfl: 2   losartan  (COZAAR ) 25 MG tablet, Take 1 tablet (25 mg total) by mouth daily., Disp: 90 tablet, Rfl: 3   meclizine  (ANTIVERT ) 25 MG tablet,  Take 1 tablet (25 mg total) by mouth 3 (three) times daily as needed for dizziness., Disp: 20 tablet, Rfl: 0   Multiple Vitamin (MULTI-VITAMINS) TABS, Take 1 tablet by mouth in the morning., Disp: , Rfl:    pantoprazole  (PROTONIX ) 40 MG tablet, Take 1 tablet (40 mg total) by mouth 2 (two) times daily., Disp: 180 tablet, Rfl: 3   REPATHA  SURECLICK 140 MG/ML SOAJ, INJECT 140MG  SUBCUTANEOUSLY  EVERY 2 WEEKS, Disp: 6 mL, Rfl: 3   VAGINAL LUBRICANT VA, Place vaginally., Disp: , Rfl:    valACYclovir (VALTREX) 500 MG tablet, Take 500 mg by mouth See admin instructions. Take 1 tablet (500 mg) by mouth twice daily x 5 day at onset of outbreak., Disp: , Rfl:       Objective:   Vitals:   08/28/23 1436  BP: (!) 128/58  Pulse: 66  SpO2: 100%  Weight: 145 lb (65.8 kg)  Height: 5' 3 (1.6 m)    Estimated body mass index is 25.69 kg/m as calculated from the following:   Height as of this encounter: 5' 3 (1.6 m).   Weight as of this encounter: 145 lb (65.8 kg).  @WEIGHTCHANGE @  [employer]   08/28/23 1436  Weight: 145 lb (65.8 kg)     Physical Exam   General: No distress. Looks well O2 at rest: no Cane present: no Sitting in wheel chair: no Frail: no Obese: no Neuro: Alert and Oriented x 3. GCS 15. Speech normal Psych: Pleasant Resp:  Barrel Chest - no.  Wheeze - no, Crackles - no, No overt respiratory distress CVS: Normal heart sounds. Murmurs - no Ext: Stigmata of Connective Tissue Disease - SSC HEENT: Normal upper airway. PEERL +. No post nasal drip        Assessment:       ICD-10-CM   1. Scleroderma (HCC)  M34.9 Pulmonary function test    2. Dyspnea and respiratory abnormalities  R06.00 Pulmonary function test   R06.89     3. Ground glass opacity present on imaging of lung  R91.8          Plan:     Patient Instructions     ICD-10-CM   1. Scleroderma (HCC)  M34.9     2. Dyspnea and respiratory abnormalities  R06.00    R06.89        November 2024 CT  chest without any interstitial lung disease and clear but in December 2024 at ER visit CT angiogram rule out pulmonary embolism but did have groundglass opacities.  You seem to have recovered from that after increasing her acid reflux medicine.  So I suspect you had acid reflux related pneumonitis and you are better right now.  Clinically I do not suspect any development of interstitial lung disease based on the fact you are better, no crackles and exercise hypoxemia test is normal.  However, we need to keep a close eye   Plan - Spirometry and DLCO in 76-month September 2025 -High-resolution CT chest in December 2025/January 2026 [we will order later]  - If the pulmonary function test is declining then we will get the CT scan immediately after the September 2025  visit  followup - September 2025 -15 minute visit after spirometry and DLCO   FOLLOWUP Return in about 3 months (around 11/28/2023) for 15 min visit, after Spiro and DLCO, with Dr Bertrum Brodie, with any of the APPS, Face to Face Visit.    SIGNATURE    Dr. Maire Scot, M.D., F.C.C.P,  Pulmonary and Critical Care Medicine Staff Physician, [facility] Director - Interstitial Lung Disease  Program  Pulmonary Fibrosis [facility] at [facility] [place], [place], [address]  Pager: [phone], If no answer or between  15:00h - 7:00h: call [phone] Telephone: [phone]  3:14 PM 08/28/2023

## 2023-08-28 NOTE — Patient Instructions (Addendum)
 ICD-10-CM   1. Scleroderma (HCC)  M34.9     2. Dyspnea and respiratory abnormalities  R06.00    R06.89        November 2024 CT chest without any interstitial lung disease and clear but in December 2024 at ER visit CT angiogram rule out pulmonary embolism but did have groundglass opacities.  You seem to have recovered from that after increasing her acid reflux medicine.  So I suspect you had acid reflux related pneumonitis and you are better right now.  Clinically I do not suspect any development of interstitial lung disease based on the fact you are better, no crackles and exercise hypoxemia test is normal.  However, we need to keep a close eye   Plan - Spirometry and DLCO in 41-month September 2025 -High-resolution CT chest in December 2025/January 2026 [we will order later]  - If the pulmonary function test is declining then we will get the CT scan immediately after the September 2025 visit  followup - September 2025 -15 minute visit after spirometry and DLCO

## 2023-09-04 ENCOUNTER — Other Ambulatory Visit (HOSPITAL_COMMUNITY): Payer: Self-pay | Admitting: Internal Medicine

## 2023-09-16 ENCOUNTER — Ambulatory Visit (INDEPENDENT_AMBULATORY_CARE_PROVIDER_SITE_OTHER)

## 2023-09-16 ENCOUNTER — Encounter: Payer: Self-pay | Admitting: Podiatry

## 2023-09-16 ENCOUNTER — Ambulatory Visit: Admitting: Podiatry

## 2023-09-16 VITALS — Ht 63.0 in | Wt 145.0 lb

## 2023-09-16 DIAGNOSIS — L600 Ingrowing nail: Secondary | ICD-10-CM | POA: Diagnosis not present

## 2023-09-16 DIAGNOSIS — M7751 Other enthesopathy of right foot: Secondary | ICD-10-CM

## 2023-09-16 DIAGNOSIS — M899 Disorder of bone, unspecified: Secondary | ICD-10-CM | POA: Diagnosis not present

## 2023-09-16 DIAGNOSIS — M7750 Other enthesopathy of unspecified foot: Secondary | ICD-10-CM

## 2023-09-18 NOTE — Progress Notes (Signed)
  Subjective:  Patient ID: Rhonda Brewer Race, female    DOB: 10-02-1950,  MRN: [id number]  Chief Complaint  Patient presents with   Toe Pain    Patient is here for right toe hallux toe pain feels like something is poking on lateral side between toe, also has ingrown nail 3rd left lateral toe    73 y.o. female presents with the above complaint. History confirmed with patient.  No known injury that she knows of.  No drainage or infection from the ingrown toenails  Objective:  Physical Exam: warm, good capillary refill, no trophic changes or ulcerative lesions, normal DP and PT pulses, normal sensory exam, and right hallux lateral border slight incurvation with pain, medial border of the left third incurvated with pain.   Radiographs: Multiple views x-ray of the right foot: On the right great toe there is no bony abnormality or exostosis in the area of concern Assessment:   1. Subungual exostosis of great toe   2. Ingrowing nail      Plan:  Patient was evaluated and treated and all questions answered.  Reviewed her clinical presentation this is likely secondary to ingrown toenail pain without paronychia of the right great toe lateral border as well as the left third toe medial border.  Discussed partial temporary and permanent matricectomy.  She will consider this option and return as needed when she is ready to proceed with this.  We reviewed her x-ray that showed no exostosis or bony concern causing these symptoms for her.  She has mild hallux valgus deformity which may be contributing but does not have pain at the bunion that I would recommend surgical intervention.  Discussed a silicone toe Pad or spacer may alleviate some pressure on this area.  Follow-up as needed.  Return if symptoms worsen or fail to improve.

## 2023-10-04 ENCOUNTER — Other Ambulatory Visit: Payer: Self-pay | Admitting: Gastroenterology

## 2023-10-06 ENCOUNTER — Encounter: Payer: Self-pay | Admitting: Family Medicine

## 2023-10-06 ENCOUNTER — Other Ambulatory Visit: Payer: Self-pay

## 2023-10-06 MED ORDER — FAMOTIDINE 20 MG PO TABS: ORAL_TABLET | ORAL | 3 refills | Status: DC

## 2023-10-10 ENCOUNTER — Encounter: Payer: Self-pay | Admitting: Family Medicine

## 2023-10-10 ENCOUNTER — Ambulatory Visit

## 2023-10-10 DIAGNOSIS — W57XXXA Bitten or stung by nonvenomous insect and other nonvenomous arthropods, initial encounter: Secondary | ICD-10-CM

## 2023-10-10 MED ORDER — TRIAMCINOLONE ACETONIDE 0.5 % EX OINT: 1.0000 | TOPICAL_OINTMENT | Freq: Two times a day (BID) | CUTANEOUS | 1 refills | Status: AC

## 2023-10-13 ENCOUNTER — Ambulatory Visit

## 2023-10-13 VITALS — BP 121/60 | HR 60 | Temp 98.5°F | Ht 63.0 in | Wt 145.2 lb

## 2023-10-13 DIAGNOSIS — Z0000 Encounter for general adult medical examination without abnormal findings: Secondary | ICD-10-CM

## 2023-10-13 DIAGNOSIS — W57XXXA Bitten or stung by nonvenomous insect and other nonvenomous arthropods, initial encounter: Secondary | ICD-10-CM | POA: Diagnosis not present

## 2023-10-13 NOTE — Patient Instructions (Addendum)
 Today we talked about the bite on your left leg.  Look for worsening of the redness, new swelling, drainage of pus, blisters or what looks like the skin is dying (blackening), or if it feels hot to touch. These could be signs of infection and we would want to reevaluate your bite.   Call back for an appointment if things get worse.  Best, Dr. Alena

## 2023-10-13 NOTE — Progress Notes (Addendum)
    SUBJECTIVE:   CHIEF COMPLAINT / HPI: Left leg bite  Bite  She was in the garden on Tuesday, felt something bite her but didn't see it. There was no tick or other bug attached to her skin. She did not immediately notice any skin changes until Wednesday and it worsened after that.  No systemic concerns like fever or joint pain. Initially, she tried Allegra, otc hydrocortisone without benefit. She started to notice blisters and called in on Friday. She was prescribed Triamcinolone  cream, which she has been using since Friday. Improvement of her symptoms but not resolution. Still has itching.   PERTINENT  PMH / PSH: none  OBJECTIVE:   BP 121/60   Pulse 60   Temp 98.5 F (36.9 C)   Ht 5' 3 (1.6 m)   Wt 145 lb 3.2 oz (65.9 kg)   SpO2 100%   BMI 25.72 kg/m     Left knee: No swelling of the left knee. Full ROM of the joint. There is a 5 cm area of circumferential erythema over the lateral left knee. No blistering. No warmth. No drainage.   ASSESSMENT/PLAN:   Assessment & Plan Bug bite without infection, initial encounter Continue use of Triamcinolone  cream. Offered Zyrtec during visit but she declined. She can call back for a prescription if she changes her mind. Return precautions discussed.    Follow up PRN.  Sakoya Win Alena Morrison, MD [facility]

## 2023-10-25 ENCOUNTER — Other Ambulatory Visit: Payer: Self-pay | Admitting: Internal Medicine

## 2023-10-25 DIAGNOSIS — I2510 Atherosclerotic heart disease of native coronary artery without angina pectoris: Secondary | ICD-10-CM

## 2023-11-28 DIAGNOSIS — L900 Lichen sclerosus et atrophicus: Secondary | ICD-10-CM | POA: Diagnosis not present

## 2023-12-08 ENCOUNTER — Encounter: Payer: Self-pay | Admitting: Family Medicine

## 2023-12-08 ENCOUNTER — Ambulatory Visit (INDEPENDENT_AMBULATORY_CARE_PROVIDER_SITE_OTHER): Admitting: Family Medicine

## 2023-12-08 VITALS — BP 126/70 | HR 67 | Temp 98.4°F | Wt 146.0 lb

## 2023-12-08 DIAGNOSIS — Z23 Encounter for immunization: Secondary | ICD-10-CM | POA: Diagnosis not present

## 2023-12-08 DIAGNOSIS — J069 Acute upper respiratory infection, unspecified: Secondary | ICD-10-CM | POA: Diagnosis not present

## 2023-12-08 DIAGNOSIS — I10 Essential (primary) hypertension: Secondary | ICD-10-CM | POA: Diagnosis not present

## 2023-12-08 NOTE — Patient Instructions (Signed)
 You have a cold and it should start to get better about 10-14 days after it started.    Continue robatussin and dayquil/nyquil as needed for symptoms.  For your nasal congestion and runny nose, try using Afrin (generic is Oxymetazoline) twice daily for 3 days.  Do not use for longer that 3 days.    Some other therapies you can try are: push fluids, rest, use vaporizer or mist as needed, and return office visit if symptoms persist or worsen.   Drinking warm liquids such as teas and soups can help with secretions and cough. A mist humidifier or vaporizer can work well to help with secretions and cough.  It is very important to clean the humidifier between use according to the instructions.    It was good to see you.  If you're still having trouble in the next week, come back and see us .    Of course, if you start having trouble breathing, worsening fevers, vomiting and unable to hold down any fluids, or you have other concerns, don't hesitate to come back or go to the ED after hours.

## 2023-12-08 NOTE — Progress Notes (Signed)
   SUBJECTIVE:   CHIEF COMPLAINT / HPI:   Discussed the use of AI scribe software for clinical note transcription with the patient, who gave verbal consent to proceed.  History of Present Illness Rhonda Brewer is a 73 year old female who presents with a persistent cough and fatigue.  Cough and upper respiratory symptoms - Persistent cough and malaise since last Tuesday, described as nonstop and disruptive to sleep - Coughing fits are frequent and rarely productive of sputum - Nasal congestion, headaches, and rhinorrhea, especially when not using over-the-counter medications - Low-grade fever began on Tuesday - No recent wheezing or ear pain - some shortness of breath with coughing, relieved by albuterol  prn - Tested negative for COVID-19 at home - Over-the-counter medications (Robitussin and NyQuil) provide some relief of symptoms - Has not received influenza vaccination this season, would like to get today.      OBJECTIVE:   BP (!) 150/57   Pulse 67   Temp 98.4 F (36.9 C)   Wt 146 lb (66.2 kg)   SpO2 99%   BMI 25.86 kg/m   Gen: tired appearing, in NAD HEENT: orophyarynx clear without exudate or erythema. Uvula midline. No tonsillar enlargement. Good dentition. TM visible b/l without bulging, erythema, purulence. No cervical or supraclavicular lymphadenopathy. Card: RRR Lungs: CTAB Ext: WWP, no edema   ASSESSMENT/PLAN:   Assessment & Plan Acute viral upper respiratory infection Symptoms align with viral URI. COVID negative. Low suspicion for influenza. Antiviral treatment not indicated. Expected resolution in 10-14 days with supportive care. - Continue Robitussin and NyQuil as needed. - Use Afrin nasal spray for congestion, limit to three days. - Monitor for high fever or productive cough. - Consider Tessalon  Perles for cough. - Use humidifier. - Administer flu vaccine today.  Hypertension Initially elevated, improved on recheck. No changes.          Rhonda CHRISTELLA Lai, DO

## 2023-12-10 ENCOUNTER — Encounter: Payer: Self-pay | Admitting: Family Medicine

## 2023-12-17 NOTE — Progress Notes (Unsigned)
 Office Visit Note  Patient: Rhonda Brewer             Date of Birth: Jul 02, 1950           MRN: [id number]             PCP: Madelon Donald HERO, DO Referring: Madelon Donald HERO, DO Visit Date: 12/31/2023 Occupation: Data Unavailable  Subjective:  Routine follow up    History of Present Illness: Rhonda Brewer is a 73 y.o. female with history of scleroderma.  Patient is not currently taking any immunosuppressive agents.  Patient has had infrequent symptoms of Raynaud's phenomenon.  She denies any digital ulcerations or increased skin tightness or thickening.  She remains on aspirin  81 mg daily.  She denies any increased pain or swelling in her joints at this time.  She has been exercising on a regular basis.  Her blood pressure has been well-controlled.  Patient states that she continues to have reflux and has been taking Protonix  twice a day and Pepcid  once daily for symptomatic relief.  She denies any dysphagia.  Patient is scheduled to follow-up with Dr. Cherrie on 01/02/2024 and Dr. Geronimo on 01/05/2024 for routine follow-up.  She denies any new or worsening pulmonary symptoms.  She denies any new medical conditions. She received the annual flu shot in the left arm recently.  That night she had difficulty using the left arm due to severity of pain in the left shoulder.  She was experiencing nocturnal pain and limited range of motion of the left shoulder joint.  Her symptoms persisted to the point that she had to have a cortisone injection on Monday which is started to ease the discomfort and improve the range of motion.   Activities of Daily Living:  Patient reports morning stiffness for 5-10 minutes.   Patient Denies nocturnal pain.  Difficulty dressing/grooming: Denies Difficulty climbing stairs: Denies Difficulty getting out of chair: Denies Difficulty using hands for taps, buttons, cutlery, and/or writing: Denies  Review of Systems  Constitutional:  Negative for fatigue.  HENT:   Positive for mouth dryness. Negative for mouth sores.   Eyes:  Positive for dryness.  Respiratory:  Negative for shortness of breath.   Cardiovascular:  Negative for chest pain and palpitations.  Gastrointestinal:  Positive for constipation. Negative for blood in stool and diarrhea.  Endocrine: Negative for increased urination.  Genitourinary:  Negative for involuntary urination.  Musculoskeletal:  Positive for morning stiffness. Negative for joint pain, gait problem, joint pain, joint swelling, myalgias, muscle weakness, muscle tenderness and myalgias.  Skin:  Negative for color change, rash, hair loss and sensitivity to sunlight.  Allergic/Immunologic: Negative for susceptible to infections.  Neurological:  Negative for dizziness and headaches.  Hematological:  Negative for swollen glands.  Psychiatric/Behavioral:  Positive for sleep disturbance. Negative for depressed mood. The patient is not nervous/anxious.     PMFS History:  Patient Active Problem List   Diagnosis Date Noted   Strain of lumbar region 12/19/2022   Abnormal liver CT 09/04/2021   Family history of diabetes mellitus (DM) 12/07/2020   De Quervain's tenosynovitis, left 12/07/2020   Genital herpes simplex 09/13/2020   Chronic interstitial cystitis 04/09/2019   Heart disease 01/08/2019   Hypertensive disorder 01/08/2019   Hypothyroidism 01/08/2019   Hyperlipidemia 01/08/2019   Acquired trigger finger of right ring finger 04/27/2018   Carpal tunnel syndrome 05/29/2017   Low back pain without sciatica 04/29/2017   H/O cardiac radiofrequency ablation 06/26/2016   CAD (coronary artery  disease), native coronary artery 06/19/2016   History of uterine cancer 06/19/2016   Scleroderma (HCC) 06/16/2016   Osteopenia of multiple sites 06/16/2016   Raynaud's phenomenon 09/22/2012   Arthritis 09/22/2012   CREST syndrome (HCC) 04/08/2011    Past Medical History:  Diagnosis Date   Allergy    Arthritis    CAD (coronary  artery disease)    a. DES to RCA 11/2015   Cancer [facility])    Uterine 2008   Cataract    CHF (congestive heart failure) (HCC)    GERD (gastroesophageal reflux disease)    Heart murmur    Hyperlipidemia    Osteoporosis    Thyroid  disease     Family History  Problem Relation Age of Onset   Heart disease Mother    Diabetes Mother    Heart disease Father    Stomach cancer Brother    Diabetes Brother    Asthma Maternal Aunt    Colon cancer Neg Hx    Esophageal cancer Neg Hx    Rectal cancer Neg Hx    Colon polyps Neg Hx    Past Surgical History:  Procedure Laterality Date   ABDOMINAL HYSTERECTOMY     APPENDECTOMY     CARDIAC ELECTROPHYSIOLOGY MAPPING AND ABLATION  04/19/2003   CARPAL TUNNEL RELEASE Right 10/15/2017   CARPAL TUNNEL RELEASE Left 06/14/2022   CATARACT EXTRACTION Bilateral    07/2021 R 08/2021 L   COLONOSCOPY     CORONARY ANGIOPLASTY WITH STENT PLACEMENT     DE QUERVAIN'S RELEASE Left 06/14/2022   RIGHT/LEFT HEART CATH AND CORONARY ANGIOGRAPHY [name] 08/14/2017   Procedure: RIGHT/LEFT HEART CATH AND CORONARY ANGIOGRAPHY;  Surgeon: Cherrie Toribio SAUNDERS, MD;  Location: [facility] INVASIVE CV LAB;  Service: Cardiovascular;  Laterality: [name];   RIGHT/LEFT HEART CATH AND CORONARY ANGIOGRAPHY [name] 11/20/2021   Procedure: RIGHT/LEFT HEART CATH AND CORONARY ANGIOGRAPHY;  Surgeon: Cherrie Toribio SAUNDERS, MD;  Location: [facility] INVASIVE CV LAB;  Service: Cardiovascular;  Laterality: [name];   ULTRASOUND GUIDANCE FOR VASCULAR ACCESS  08/14/2017   Procedure: Ultrasound Guidance For Vascular Access;  Surgeon: Cherrie Toribio SAUNDERS, MD;  Location: [facility] INVASIVE CV LAB;  Service: Cardiovascular;;   UPPER GI ENDOSCOPY  07/15/2022   Social History   Tobacco Use   Smoking status: Never    Passive exposure: Past   Smokeless tobacco: Never  Vaping Use   Vaping status: Never Used  Substance Use Topics   Alcohol use: Yes    Comment: occasional wine   Drug use: No   Social History   Social History  Narrative   Lives in home with husband. Daughters live out of state. Step-daughter in [place]. Has 8 grandchildren. Lives in 2 level home but most living is on first floor. Stairs have handrails, no problem with stairs. No grab bars in bathroom. No tripping hazards. Smoke alarms present.   No pets.    Eats a good variety of foods, husband is a cook. Eats meats, fruits, vegetables. Drinks water mostly. Occasional tea.   Goes to gym 5 times a week. Enjoys reading, sewing, movies. Walking when weather is nice.    Wears seat belt in car.      Immunization History  Administered Date(s) Administered   Fluad Quad(high Dose 65+) 11/29/2019, 12/07/2020, 12/31/2021   INFLUENZA, HIGH DOSE SEASONAL PF 12/08/2023   Influenza Split 01/17/2016   Influenza, Seasonal, Injecte, Preservative Fre 02/20/2012   Influenza,inj,Quad PF,6+ Mos 11/12/2018   Influenza-Unspecified 04/02/2017, 12/08/2017, 12/05/2022   Moderna Covid-19 Vaccine  Bivalent Booster 36yrs & up 01/31/2021   Moderna SARS-COV2 Booster Vaccination 01/13/2020, 09/25/2020   Moderna Sars-Covid-2 Vaccination 04/09/2019, 05/14/2019   Pneumococcal Conjugate-13 10/26/2014   Pneumococcal Polysaccharide-23 11/12/2018   Pneumococcal-Unspecified 09/14/2013   Td 07/18/2011   Unspecified SARS-COV-2 Vaccination 02/19/2022, 12/05/2022     Objective: Vital Signs: BP 127/75   Pulse 60   Temp 97.6 F (36.4 C)   Resp 12   Ht 5' 4 (1.626 m)   Wt 141 lb 6.4 oz (64.1 kg)   BMI 24.27 kg/m    Physical Exam Vitals and nursing note reviewed.  Constitutional:      Appearance: She is well-developed.  HENT:     Head: Normocephalic and atraumatic.  Eyes:     Conjunctiva/sclera: Conjunctivae normal.  Cardiovascular:     Rate and Rhythm: Normal rate and regular rhythm.     Heart sounds: Normal heart sounds.  Pulmonary:     Effort: Pulmonary effort is normal.     Breath sounds: Normal breath sounds.  Abdominal:     General: Bowel sounds are normal.      Palpations: Abdomen is soft.  Musculoskeletal:     Cervical back: Normal range of motion.  Lymphadenopathy:     Cervical: No cervical adenopathy.  Skin:    General: Skin is warm and dry.     Capillary Refill: Capillary refill takes less than 2 seconds.     Comments: Sclerodactyly was noted distal to PIP joints.  Poikiloderma was noted.  Nailbed capillary dropout was noted.  No digital ulcers were noted.  Telangiectasias were noted on the palmar aspect of both hands.  Neurological:     Mental Status: She is alert and oriented to person, place, and time.  Psychiatric:        Behavior: Behavior normal.      Musculoskeletal Exam: C-spine, thoracic spine, lumbar spine have good range of motion.  No midline spinal tenderness.  No SI joint tenderness.  Shoulder joints, elbow joints, wrist joints, MCPs, PIPs, DIPs have good range of motion with no synovitis.  Complete fist formation bilaterally.  Hip joints have good range of motion with no groin pain.  Knee joints have good range of motion no warmth or effusion.  Ankle joints have good range of motion no tenderness or joint swelling.  No evidence of Achilles tendinitis or plantar fasciitis.   CDAI Exam: CDAI Score: -- Patient Global: --; Provider Global: -- Swollen: --; Tender: -- Joint Exam 12/31/2023   No joint exam has been documented for this visit   There is currently no information documented on the homunculus. Go to the Rheumatology activity and complete the homunculus joint exam.  Investigation: No additional findings.  Imaging: No results found.  Recent Labs: Lab Results  Component Value Date   WBC 5.9 04/22/2023   HGB 13.3 04/22/2023   PLT 236 04/22/2023   NA 141 07/16/2023   K 4.8 07/16/2023   CL 109 07/16/2023   CO2 24 07/16/2023   GLUCOSE 95 07/16/2023   BUN 13 07/16/2023   CREATININE 0.92 07/16/2023   BILITOT 0.8 07/16/2023   ALKPHOS 66 07/16/2023   AST 34 07/16/2023   ALT 18 07/16/2023   PROT 7.0 07/16/2023    ALBUMIN 4.2 07/16/2023   CALCIUM  10.1 07/16/2023   GFRAA 78 07/11/2020    Speciality Comments: PLQ Eye Exam: 08/07/17 PLQ toxcity,  PLQ discotinued @ [facility]  Procedures:  No procedures performed Allergies: Avelox [moxifloxacin hcl in nacl], Latex, and  Other   Assessment / Plan:     Visit Diagnoses: Scleroderma (HCC) - Limited systemic with Raynauds, Telangiectasia, sclerodactyly, arthralgias, erosions in right fifth and left third DIP, ANA centromere: Sclerodactyly noted distal to PIP joints.  No digital ulcerations noted..  Poikiloderma noted.  Telangiectasias on plamar aspect of both hands.  No progression noted.  No synovitis noted.  She has had infrequent symptoms of Raynaud's phenomenon.  Patient continues to experience reflux but overall her symptoms have been manageable taking Protonix  twice daily and Pepcid  once daily.  No dysphagia.  Intermittent constipation-she has increased fiber and water intake.  Blood pressure has been well-controlled.  Her blood pressure was 127/75 today in the office. No new or worsening pulmonary symptoms.  High chest CT updated on 02/12/2023 did not reveal any evidence of fibrotic interstitial lung disease.  Scheduled for follow up with Dr. Geronimo on 01/05/24--plan to update pulmonary function testing. Scheduled to see Dr. Cherrie on 01/02/24--planning to update echocardiogram.   Plan to update CBC with diff, CMP with GFR, and UA today.   She will notify us  if she develops any new or worsening symptoms.  She is not currently taking immunosuppressive agents. She will follow up in 5 months or sooner if needed.   - Plan: CBC with Differential/Platelet, Comprehensive metabolic panel with GFR, Urinalysis, Routine w reflex microscopic  High risk medication use - She is not currently on any immunosuppressive agents.  Dr. Octavia advised patient to stop taking plaquenil -may 2019. - Plan: CBC with Differential/Platelet, Comprehensive metabolic panel with  GFR  Raynaud's phenomenon without gangrene: She has had infrequent symptoms of Raynaud's phenomenon.  No digital ulcerations.  Capillary refill was less than 2 seconds today. She remains on aspirin  81 mg daily.  Discussed the importance of keeping her core body temperature warm as well as wearing gloves and socks especially during the winter months.  She will notify us  if she develops any new or worsening symptoms.  Chronic pain of left knee: No warmth or effusion noted.  History of gastroesophageal reflux (GERD) - She is followed by Dr. Shila.  She is on Protonix  40 mg twice daily now and Pepcid  20 mg at bedtime.hx gastric cancer in her brother and maternal uncle.  Osteopenia of multiple sites - DEXA 12/07/2019:The BMD measured at Femur Neck Left is 0.827 g/cm2 with a T-score of -1.5.   She is taking a calcium  vitamin D supplement daily. No updated DEXA available to review in Epic.   Other medical conditions are listed as follows:   History of uterine cancer  History of coronary artery disease  History of hypothyroidism  H/O cardiac radiofrequency ablation  Dyslipidemia    Orders: Orders Placed This Encounter  Procedures   CBC with Differential/Platelet   Comprehensive metabolic panel with GFR   Urinalysis, Routine w reflex microscopic   No orders of the defined types were placed in this encounter.   Follow-Up Instructions: Return in about 5 months (around 05/30/2024) for Scleroderma.   Waddell CHRISTELLA Craze, PA-C  Note - This record has been created using Dragon software.  Chart creation errors have been sought, but may not always  have been located. Such creation errors do not reflect on  the standard of medical care.

## 2023-12-19 ENCOUNTER — Other Ambulatory Visit: Payer: Self-pay | Admitting: Internal Medicine

## 2023-12-19 DIAGNOSIS — E785 Hyperlipidemia, unspecified: Secondary | ICD-10-CM

## 2023-12-19 DIAGNOSIS — I2510 Atherosclerotic heart disease of native coronary artery without angina pectoris: Secondary | ICD-10-CM

## 2023-12-22 ENCOUNTER — Ambulatory Visit (INDEPENDENT_AMBULATORY_CARE_PROVIDER_SITE_OTHER): Admitting: Family Medicine

## 2023-12-22 ENCOUNTER — Encounter: Payer: Self-pay | Admitting: Family Medicine

## 2023-12-22 VITALS — BP 116/72 | Ht 64.0 in | Wt 140.0 lb

## 2023-12-22 DIAGNOSIS — M25512 Pain in left shoulder: Secondary | ICD-10-CM

## 2023-12-22 NOTE — Progress Notes (Signed)
 PCP: No primary care provider on file.  Subjective:   HPI: Patient is a 73 y.o. female here for left shoulder pain.  Patient denies known injury or trauma. She's had pain in left shoulder previously but usually it has gone away. Current pain present for about a week. Worse laterally. Wakes her up at night. Bothers with reaching and overhead motions. She is right handed. Taking tylenol , icing and using heat. Rarely takes aleve due to family history of stomach cancer.  Past Medical History:  Diagnosis Date   Allergy    Arthritis    CAD (coronary artery disease)    a. DES to RCA 11/2015   Cancer [facility])    Uterine 2008   Cataract    CHF (congestive heart failure) (HCC)    GERD (gastroesophageal reflux disease)    Heart murmur    Hyperlipidemia    Osteoporosis    Thyroid  disease     Current Outpatient Medications on File Prior to Visit  Medication Sig Dispense Refill   acetaminophen  (TYLENOL ) 500 MG tablet Take 500-1,000 mg by mouth every 6 (six) hours as needed (pain.).     albuterol  (VENTOLIN  HFA) 108 (90 Base) MCG/ACT inhaler Inhale 2 puffs into the lungs every 4 (four) hours as needed for wheezing or shortness of breath. 18 g 6   aspirin  EC 81 MG tablet Take 81 mg by mouth at bedtime.     atorvastatin  (LIPITOR) 40 MG tablet TAKE 1 TABLET BY MOUTH DAILY 100 tablet 2   Bacillus Coagulans-Inulin (PROBIOTIC) 1-250 BILLION-MG CAPS      Calcium  Carbonate-Vitamin D (CALCIUM -VITAMIN D3 PO) Take 1 tablet by mouth in the morning and at bedtime.     Cyanocobalamin (VITAMIN B-12 PO) Take 1 tablet by mouth in the morning.     cycloSPORINE (RESTASIS) 0.05 % ophthalmic emulsion Place 1 drop into both eyes 2 (two) times daily.     famotidine  (PEPCID ) 20 MG tablet TAKE 1 TABLET(20 MG) BY MOUTH AT BEDTIME 90 tablet 3   fexofenadine (ALLEGRA) 180 MG tablet Take 180 mg by mouth as needed for allergies or rhinitis.     Glucos-Chond-MSM-Bor-D3-Hyalur (MOVE FREE JOINT HEALTH ADV + D PO)       levothyroxine  (SYNTHROID ) 75 MCG tablet TAKE 1 TABLET BY MOUTH DAILY  BEFORE BREAKFAST 100 tablet 2   losartan  (COZAAR ) 25 MG tablet TAKE 1 TABLET BY MOUTH DAILY 90 tablet 3   meclizine  (ANTIVERT ) 25 MG tablet Take 1 tablet (25 mg total) by mouth 3 (three) times daily as needed for dizziness. (Patient not taking: Reported on 10/13/2023) 20 tablet 0   Multiple Vitamin (MULTI-VITAMINS) TABS Take 1 tablet by mouth in the morning.     pantoprazole  (PROTONIX ) 40 MG tablet Take 1 tablet (40 mg total) by mouth 2 (two) times daily. 180 tablet 3   REPATHA  SURECLICK 140 MG/ML SOAJ INJECT 1 PEN SUBCUTANEOUSLY  EVERY 2 WEEKS 6 mL 3   triamcinolone  ointment (KENALOG ) 0.5 % Apply 1 Application topically 2 (two) times daily. 60 g 1   VAGINAL LUBRICANT VA Place vaginally.     valACYclovir (VALTREX) 500 MG tablet Take 500 mg by mouth See admin instructions. Take 1 tablet (500 mg) by mouth twice daily x 5 day at onset of outbreak.     No current facility-administered medications on file prior to visit.    Past Surgical History:  Procedure Laterality Date   ABDOMINAL HYSTERECTOMY     APPENDECTOMY     CARDIAC ELECTROPHYSIOLOGY MAPPING AND  ABLATION  04/19/2003   CARPAL TUNNEL RELEASE Right 10/15/2017   CARPAL TUNNEL RELEASE Left 06/14/2022   CATARACT EXTRACTION Bilateral    07/2021 R 08/2021 L   COLONOSCOPY     CORONARY ANGIOPLASTY WITH STENT PLACEMENT     DE QUERVAIN'S RELEASE Left 06/14/2022   RIGHT/LEFT HEART CATH AND CORONARY ANGIOGRAPHY [name] 08/14/2017   Procedure: RIGHT/LEFT HEART CATH AND CORONARY ANGIOGRAPHY;  Surgeon: Cherrie Toribio SAUNDERS, MD;  Location: [facility] INVASIVE CV LAB;  Service: Cardiovascular;  Laterality: [name];   RIGHT/LEFT HEART CATH AND CORONARY ANGIOGRAPHY [name] 11/20/2021   Procedure: RIGHT/LEFT HEART CATH AND CORONARY ANGIOGRAPHY;  Surgeon: Cherrie Toribio SAUNDERS, MD;  Location: [facility] INVASIVE CV LAB;  Service: Cardiovascular;  Laterality: [name];   ULTRASOUND GUIDANCE FOR VASCULAR ACCESS  08/14/2017    Procedure: Ultrasound Guidance For Vascular Access;  Surgeon: Cherrie Toribio SAUNDERS, MD;  Location: [facility] INVASIVE CV LAB;  Service: Cardiovascular;;   UPPER GI ENDOSCOPY  07/15/2022    Allergies  Allergen Reactions   Avelox [Moxifloxacin Hcl In Nacl] Other (See Comments)    dizziness   Latex Rash    Mild sensitivity( Rash)per patient-    Other Other (See Comments)    SEASONAL ALLERGIES     BP 116/72   Ht 5' 4 (1.626 m)   Wt 140 lb (63.5 kg)   BMI 24.03 kg/m      12/11/2020   11:36 AM 12/25/2020   11:26 AM  [facility] Adult Exercise  Frequency of aerobic exercise (# of days/week) 6 6  Average time in minutes 60 60  Frequency of strengthening activities (# of days/week) 5 5        No data to display              Objective:  Physical Exam:  Gen: NAD, comfortable in exam room  Left shoulder: No swelling, ecchymoses.  No gross deformity. No TTP AC or biceps tendon. FROM with painful arc. Positive Hawkins, Neers. Negative Yergasons. Strength 5/5 with empty can and resisted internal/external rotation.  Pain empty can. Negative apprehension. NV intact distally.   Assessment & Plan:  1. Left shoulder pain - due to rotator cuff impingement.  Home exercise program reviewed.  Tylenol .  Consider aleve or voltaren gel.  Consider physical therapy if not improving.  Discussed activity avoidance.  Follow up in 1 month.

## 2023-12-22 NOTE — Patient Instructions (Signed)
 You have rotator cuff impingement Try to avoid painful activities (overhead activities, lifting with extended arm) as much as possible. Tylenol  500mg  1-2 tabs three times a day as needed for pain. Consider aleve 2 tabs twice a day with food OR voltaren gel up to 4 times a day topically. Subacromial injection may be beneficial to help with pain and to decrease inflammation. Consider physical therapy with transition to home exercise program. Do home exercise program with theraband and scapular stabilization exercises daily 3 sets of 10 once a day. If not improving at follow-up we will consider imaging, injection, physical therapy, and/or nitro patches. Follow up with me in 1 month but call me sooner if you're struggling.

## 2023-12-29 ENCOUNTER — Ambulatory Visit: Admitting: Family Medicine

## 2023-12-29 ENCOUNTER — Encounter: Payer: Self-pay | Admitting: Family Medicine

## 2023-12-29 ENCOUNTER — Other Ambulatory Visit: Payer: Self-pay

## 2023-12-29 VITALS — BP 118/64 | Ht 64.0 in | Wt 140.0 lb

## 2023-12-29 DIAGNOSIS — M25512 Pain in left shoulder: Secondary | ICD-10-CM | POA: Diagnosis not present

## 2023-12-29 MED ORDER — METHYLPREDNISOLONE ACETATE 40 MG/ML IJ SUSP
40.0000 mg | Freq: Once | INTRAMUSCULAR | Status: AC
  Administered 2023-12-29: 40 mg via INTRA_ARTICULAR

## 2023-12-29 NOTE — Progress Notes (Signed)
 PCP: No primary care provider on file.  Subjective:   HPI: Patient is a 73 y.o. female here for left shoulder pain.  10/6: Patient denies known injury or trauma. She's had pain in left shoulder previously but usually it has gone away. Current pain present for about a week. Worse laterally. Wakes her up at night. Bothers with reaching and overhead motions. She is right handed. Taking tylenol , icing and using heat. Rarely takes aleve due to family history of stomach cancer.  10/13: Patient reports pain has worsened since last visit. Difficulty sleeping as a result of her pain. Doing exercises but these are difficult. Worse with reaching and overhead motions.  Past Medical History:  Diagnosis Date   Allergy    Arthritis    CAD (coronary artery disease)    a. DES to RCA 11/2015   Cancer [facility])    Uterine 2008   Cataract    CHF (congestive heart failure) (HCC)    GERD (gastroesophageal reflux disease)    Heart murmur    Hyperlipidemia    Osteoporosis    Thyroid  disease     Current Outpatient Medications on File Prior to Visit  Medication Sig Dispense Refill   acetaminophen  (TYLENOL ) 500 MG tablet Take 500-1,000 mg by mouth every 6 (six) hours as needed (pain.).     albuterol  (VENTOLIN  HFA) 108 (90 Base) MCG/ACT inhaler Inhale 2 puffs into the lungs every 4 (four) hours as needed for wheezing or shortness of breath. 18 g 6   aspirin  EC 81 MG tablet Take 81 mg by mouth at bedtime.     atorvastatin  (LIPITOR) 40 MG tablet TAKE 1 TABLET BY MOUTH DAILY 100 tablet 2   Bacillus Coagulans-Inulin (PROBIOTIC) 1-250 BILLION-MG CAPS      Calcium  Carbonate-Vitamin D (CALCIUM -VITAMIN D3 PO) Take 1 tablet by mouth in the morning and at bedtime.     Cyanocobalamin (VITAMIN B-12 PO) Take 1 tablet by mouth in the morning.     cycloSPORINE (RESTASIS) 0.05 % ophthalmic emulsion Place 1 drop into both eyes 2 (two) times daily.     famotidine  (PEPCID ) 20 MG tablet TAKE 1 TABLET(20 MG) BY MOUTH  AT BEDTIME 90 tablet 3   fexofenadine (ALLEGRA) 180 MG tablet Take 180 mg by mouth as needed for allergies or rhinitis.     Glucos-Chond-MSM-Bor-D3-Hyalur (MOVE FREE JOINT HEALTH ADV + D PO)      levothyroxine  (SYNTHROID ) 75 MCG tablet TAKE 1 TABLET BY MOUTH DAILY  BEFORE BREAKFAST 100 tablet 2   losartan  (COZAAR ) 25 MG tablet TAKE 1 TABLET BY MOUTH DAILY 90 tablet 3   meclizine  (ANTIVERT ) 25 MG tablet Take 1 tablet (25 mg total) by mouth 3 (three) times daily as needed for dizziness. (Patient not taking: Reported on 10/13/2023) 20 tablet 0   Multiple Vitamin (MULTI-VITAMINS) TABS Take 1 tablet by mouth in the morning.     pantoprazole  (PROTONIX ) 40 MG tablet Take 1 tablet (40 mg total) by mouth 2 (two) times daily. 180 tablet 3   REPATHA  SURECLICK 140 MG/ML SOAJ INJECT 1 PEN SUBCUTANEOUSLY  EVERY 2 WEEKS 6 mL 3   triamcinolone  ointment (KENALOG ) 0.5 % Apply 1 Application topically 2 (two) times daily. 60 g 1   VAGINAL LUBRICANT VA Place vaginally.     valACYclovir (VALTREX) 500 MG tablet Take 500 mg by mouth See admin instructions. Take 1 tablet (500 mg) by mouth twice daily x 5 day at onset of outbreak.     No current facility-administered medications on  file prior to visit.    Past Surgical History:  Procedure Laterality Date   ABDOMINAL HYSTERECTOMY     APPENDECTOMY     CARDIAC ELECTROPHYSIOLOGY MAPPING AND ABLATION  04/19/2003   CARPAL TUNNEL RELEASE Right 10/15/2017   CARPAL TUNNEL RELEASE Left 06/14/2022   CATARACT EXTRACTION Bilateral    07/2021 R 08/2021 L   COLONOSCOPY     CORONARY ANGIOPLASTY WITH STENT PLACEMENT     DE QUERVAIN'S RELEASE Left 06/14/2022   RIGHT/LEFT HEART CATH AND CORONARY ANGIOGRAPHY [name] 08/14/2017   Procedure: RIGHT/LEFT HEART CATH AND CORONARY ANGIOGRAPHY;  Surgeon: Cherrie Toribio SAUNDERS, MD;  Location: [facility] INVASIVE CV LAB;  Service: Cardiovascular;  Laterality: [name];   RIGHT/LEFT HEART CATH AND CORONARY ANGIOGRAPHY [name] 11/20/2021   Procedure: RIGHT/LEFT HEART  CATH AND CORONARY ANGIOGRAPHY;  Surgeon: Cherrie Toribio SAUNDERS, MD;  Location: [facility] INVASIVE CV LAB;  Service: Cardiovascular;  Laterality: [name];   ULTRASOUND GUIDANCE FOR VASCULAR ACCESS  08/14/2017   Procedure: Ultrasound Guidance For Vascular Access;  Surgeon: Cherrie Toribio SAUNDERS, MD;  Location: [facility] INVASIVE CV LAB;  Service: Cardiovascular;;   UPPER GI ENDOSCOPY  07/15/2022    Allergies  Allergen Reactions   Avelox [Moxifloxacin Hcl In Nacl] Other (See Comments)    dizziness   Latex Rash    Mild sensitivity( Rash)per patient-    Other Other (See Comments)    SEASONAL ALLERGIES     BP 118/64   Ht 5' 4 (1.626 m)   Wt 140 lb (63.5 kg)   BMI 24.03 kg/m      12/11/2020   11:36 AM 12/25/2020   11:26 AM  [facility] Adult Exercise  Frequency of aerobic exercise (# of days/week) 6 6  Average time in minutes 60 60  Frequency of strengthening activities (# of days/week) 5 5        No data to display              Objective:  Physical Exam:  Gen: NAD, comfortable in exam room  Left shoulder: No swelling, ecchymoses.  No gross deformity. No TTP AC or biceps tendon. FROM with painful arc. Positive Hawkins, Neers. Strength 5/5 with empty can and resisted internal/external rotation.  Pain empty can. NV intact distally.   Assessment & Plan:  1. Left shoulder pain - due to rotator cuff impingement.  Waking her from sleep - went ahead with subacromial injection today after discussion of risks/benefits. Aleve or voltaren gel if needed.  Wait about 5 days before restarting home exercise program.  Consider physical therapy if not improving.  Follow up in 1 month.  After informed written consent timeout was performed, patient was seated in chair in exam room. Left shoulder was prepped with alcohol swab and utilizing lateral approach with ultrasound guidance, patient's left subacromial space was injected with 3:1 lidocaine : depomedrol. Patient tolerated the procedure well  without immediate complications.

## 2023-12-31 ENCOUNTER — Ambulatory Visit: Attending: Physician Assistant | Admitting: Physician Assistant

## 2023-12-31 ENCOUNTER — Encounter: Payer: Self-pay | Admitting: Physician Assistant

## 2023-12-31 VITALS — BP 127/75 | HR 60 | Temp 97.6°F | Resp 12 | Ht 64.0 in | Wt 141.4 lb

## 2023-12-31 DIAGNOSIS — M25562 Pain in left knee: Secondary | ICD-10-CM | POA: Diagnosis not present

## 2023-12-31 DIAGNOSIS — Z8542 Personal history of malignant neoplasm of other parts of uterus: Secondary | ICD-10-CM

## 2023-12-31 DIAGNOSIS — M8589 Other specified disorders of bone density and structure, multiple sites: Secondary | ICD-10-CM

## 2023-12-31 DIAGNOSIS — I7300 Raynaud's syndrome without gangrene: Secondary | ICD-10-CM

## 2023-12-31 DIAGNOSIS — Z9889 Other specified postprocedural states: Secondary | ICD-10-CM | POA: Diagnosis not present

## 2023-12-31 DIAGNOSIS — G8929 Other chronic pain: Secondary | ICD-10-CM | POA: Diagnosis not present

## 2023-12-31 DIAGNOSIS — Z8719 Personal history of other diseases of the digestive system: Secondary | ICD-10-CM | POA: Diagnosis not present

## 2023-12-31 DIAGNOSIS — Z79899 Other long term (current) drug therapy: Secondary | ICD-10-CM | POA: Diagnosis not present

## 2023-12-31 DIAGNOSIS — Z8679 Personal history of other diseases of the circulatory system: Secondary | ICD-10-CM

## 2023-12-31 DIAGNOSIS — Z8639 Personal history of other endocrine, nutritional and metabolic disease: Secondary | ICD-10-CM

## 2023-12-31 DIAGNOSIS — M349 Systemic sclerosis, unspecified: Secondary | ICD-10-CM

## 2023-12-31 DIAGNOSIS — E785 Hyperlipidemia, unspecified: Secondary | ICD-10-CM | POA: Diagnosis not present

## 2024-01-01 ENCOUNTER — Ambulatory Visit: Payer: Self-pay | Admitting: Physician Assistant

## 2024-01-01 LAB — COMPREHENSIVE METABOLIC PANEL WITH GFR
AG Ratio: 2 (calc) (ref 1.0–2.5)
ALT: 14 U/L (ref 6–29)
AST: 20 U/L (ref 10–35)
Albumin: 4.8 g/dL (ref 3.6–5.1)
Alkaline phosphatase (APISO): 79 U/L (ref 37–153)
BUN: 19 mg/dL (ref 7–25)
CO2: 23 mmol/L (ref 20–32)
Calcium: 9.7 mg/dL (ref 8.6–10.4)
Chloride: 105 mmol/L (ref 98–110)
Creat: 0.86 mg/dL (ref 0.60–1.00)
Globulin: 2.4 g/dL (ref 1.9–3.7)
Glucose, Bld: 92 mg/dL (ref 65–99)
Potassium: 4.3 mmol/L (ref 3.5–5.3)
Sodium: 138 mmol/L (ref 135–146)
Total Bilirubin: 0.3 mg/dL (ref 0.2–1.2)
Total Protein: 7.2 g/dL (ref 6.1–8.1)
eGFR: 71 mL/min/1.73m2 (ref >=60)

## 2024-01-01 LAB — URINALYSIS, ROUTINE W REFLEX MICROSCOPIC
Bacteria, UA: NONE SEEN /HPF
Bilirubin Urine: NEGATIVE
Glucose, UA: NEGATIVE
Hgb urine dipstick: NEGATIVE
Hyaline Cast: NONE SEEN /LPF
Ketones, ur: NEGATIVE
Nitrite: NEGATIVE
Protein, ur: NEGATIVE
RBC / HPF: NONE SEEN /HPF (ref 0–2)
Specific Gravity, Urine: 1.022 (ref 1.001–1.035)
Squamous Epithelial / HPF: NONE SEEN /HPF (ref <=5)
pH: <=5 — AB (ref 5.0–8.0)

## 2024-01-01 LAB — CBC WITH DIFFERENTIAL/PLATELET
Absolute Lymphocytes: 1902 {cells}/uL (ref 850–3900)
Absolute Monocytes: 548 {cells}/uL (ref 200–950)
Basophils Absolute: 22 {cells}/uL (ref 0–200)
Basophils Relative: 0.3 %
Eosinophils Absolute: 118 {cells}/uL (ref 15–500)
Eosinophils Relative: 1.6 %
HCT: 40.9 % (ref 35.0–45.0)
Hemoglobin: 13.5 g/dL (ref 11.7–15.5)
MCH: 31.5 pg (ref 27.0–33.0)
MCHC: 33 g/dL (ref 32.0–36.0)
MCV: 95.3 fL (ref 80.0–100.0)
MPV: 10.5 fL (ref 7.5–12.5)
Monocytes Relative: 7.4 %
Neutro Abs: 4810 {cells}/uL (ref 1500–7800)
Neutrophils Relative %: 65 %
Platelets: 241 Thousand/uL (ref 140–400)
RBC: 4.29 Million/uL (ref 3.80–5.10)
RDW: 12.5 % (ref 11.0–15.0)
Total Lymphocyte: 25.7 %
WBC: 7.4 Thousand/uL (ref 3.8–10.8)

## 2024-01-01 LAB — MICROSCOPIC MESSAGE

## 2024-01-01 NOTE — Progress Notes (Signed)
 CBC and CMP WNL UA 1+ leukocytes.  Negative for bacteria and nitrites.

## 2024-01-02 ENCOUNTER — Encounter (HOSPITAL_COMMUNITY): Payer: Self-pay | Admitting: Internal Medicine

## 2024-01-02 ENCOUNTER — Ambulatory Visit (HOSPITAL_COMMUNITY)
Admission: RE | Admit: 2024-01-02 | Discharge: 2024-01-02 | Disposition: A | Source: Ambulatory Visit | Attending: Internal Medicine | Admitting: Internal Medicine

## 2024-01-02 ENCOUNTER — Ambulatory Visit (HOSPITAL_COMMUNITY)
Admission: RE | Admit: 2024-01-02 | Discharge: 2024-01-02 | Disposition: A | Discharge status: Home / Self Care | Source: Ambulatory Visit | Attending: Internal Medicine | Admitting: Internal Medicine

## 2024-01-02 VITALS — BP 118/64 | HR 62 | Wt 143.6 lb

## 2024-01-02 DIAGNOSIS — Z955 Presence of coronary angioplasty implant and graft: Secondary | ICD-10-CM | POA: Insufficient documentation

## 2024-01-02 DIAGNOSIS — I471 Supraventricular tachycardia, unspecified: Secondary | ICD-10-CM | POA: Insufficient documentation

## 2024-01-02 DIAGNOSIS — M341 CR(E)ST syndrome: Secondary | ICD-10-CM | POA: Diagnosis not present

## 2024-01-02 DIAGNOSIS — I509 Heart failure, unspecified: Secondary | ICD-10-CM | POA: Diagnosis not present

## 2024-01-02 DIAGNOSIS — E785 Hyperlipidemia, unspecified: Secondary | ICD-10-CM | POA: Diagnosis not present

## 2024-01-02 DIAGNOSIS — I2510 Atherosclerotic heart disease of native coronary artery without angina pectoris: Secondary | ICD-10-CM | POA: Insufficient documentation

## 2024-01-02 DIAGNOSIS — Z79899 Other long term (current) drug therapy: Secondary | ICD-10-CM | POA: Diagnosis not present

## 2024-01-02 DIAGNOSIS — K219 Gastro-esophageal reflux disease without esophagitis: Secondary | ICD-10-CM | POA: Insufficient documentation

## 2024-01-02 DIAGNOSIS — M349 Systemic sclerosis, unspecified: Secondary | ICD-10-CM | POA: Diagnosis not present

## 2024-01-02 DIAGNOSIS — I110 Hypertensive heart disease with heart failure: Secondary | ICD-10-CM | POA: Diagnosis not present

## 2024-01-02 LAB — ECHOCARDIOGRAM COMPLETE
Area-P 1/2: 3.28 cm2
S' Lateral: 3.2 cm

## 2024-01-02 NOTE — Patient Instructions (Signed)
 Good to see you today!  No medication changes were made  Your physician has requested that you have an echocardiogram. Echocardiography is a painless test that uses sound waves to create images of your heart. It provides your doctor with information about the size and shape of your heart and how well your heart's chambers and valves are working. This procedure takes approximately one hour. There are no restrictions for this procedure. Please do NOT wear cologne, perfume, aftershave, or lotions (deodorant is allowed). Please arrive 15 minutes prior to your appointment time.  Please note: We ask at that you not bring children with you during ultrasound (echo/ vascular) testing. Due to room size and safety concerns, children are not allowed in the ultrasound rooms during exams. Our front office staff cannot provide observation of children in our lobby area while testing is being conducted. An adult accompanying a patient to their appointment will only be allowed in the ultrasound room at the discretion of the ultrasound technician under special circumstances. We apologize for any inconvenience.  Your physician recommends that you schedule a follow-up appointment in: 1 year with echocardiogram( October 2026) Call office in August 2026 to schedule an appointment   If you have any questions or concerns before your next appointment please send us  a message through [place] or call our office at [phone].    TO LEAVE A MESSAGE FOR THE NURSE SELECT OPTION 2, PLEASE LEAVE A MESSAGE INCLUDING: YOUR NAME DATE OF BIRTH CALL BACK NUMBER REASON FOR CALL**this is important as we prioritize the call backs  YOU WILL RECEIVE A CALL BACK THE SAME DAY AS LONG AS YOU CALL BEFORE 4:00 PM At the [facility], you and your health needs are our priority. As part of our continuing mission to provide you with exceptional heart care, we have created designated Provider Care Teams. These Care Teams  include your primary Cardiologist (physician) and Advanced Practice Providers (APPs- Physician Assistants and Nurse Practitioners) who all work together to provide you with the care you need, when you need it.   You may see any of the following providers on your designated Care Team at your next follow up: Dr Toribio Fuel Dr Ezra Shuck Dr. Ria Commander Dr. Morene Brownie Amy Lenetta, NP Caffie Shed, [place] [facility] [place], [place] Beckey Coe, NP [place] Lee, NP Ellouise Class, NP Tinnie Redman, PharmD Jaun Bash, PharmD   Please be sure to bring in all your medications bottles to every appointment.    Thank you for choosing [facility]

## 2024-01-02 NOTE — Addendum Note (Signed)
 Encounter addended by: Larrissa Stivers M, RN on: 01/02/2024 12:52 PM  Actions taken: Order list changed, Diagnosis association updated, Clinical Note Signed

## 2024-01-02 NOTE — Progress Notes (Signed)
 PCP: Madelon Donald HERO, DO HF Cardiologist: Dr. Cherrie   HPI: Rhonda Brewer is a 73 y.o.woman with h/o scleroderma, CAD s/p stent 9/17 , SVT s/p ablation 2/05 referred by Dr. Monna for screening for [facility] in setting of scleroderma.   Previously lived in [place]. 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   In 9/17 had heart cath due to positive stress test. At time had exertional fatigue and dyspnea and arm tingling. No CP.    Cath 11/24/15 LM: mild irregs LAD: 20% mid LCX: mild irreg RCA: mRCA 99% ->Xience Alpine DES 3.5x35mm  Echo 11/22/15: LVEF 55-60% Mild AI. Normal RV. Mild TR.   Echo 08/27/16: EF 60-65%,  RV normal Mild AI. No RV strain or PAH.   She was seen in the HF clinic for initial evaluation in June 2018. It was felt that her dyspnea could be due to Brilinta . She was switched to Plavix . Chest CT was ordered and showed no interstitial lung disease.   Cath 5/19 which showed very mild CAD. No evidence. There was a small step-up in saturations at RA level suggestive of anomalous pulmonary vein or shunt at atrial level   Repeat Hi-res CT on 09/07/21. No ILD.  3v coronary calcium   Acute visit 9/23 with CP. Arranged for cath-->R/LHC (9/23) stable CAD with patent RCA stent and 40% mLAD lesion. EF 60-65%, normal right heart pressures.  Echo 8/24 EF 60-65% RV normal. Small effusion   Today she returns for follow up. Overall feeling fine. Works out at [employer] 2x/week, does a Body Attack class, she gets HR up to 150-160 bpm, no undue dyspnea, CP or palpitations. Resting HR 60 bpm. Denies abnormal bleeding, dizziness, edema, or PND/Orthopnea. Appetite ok.Taking all medications.   Echo today 01/02/24, EF 60-66%, mild MI  Cardiac Studies: - Echo 8/24 EF 60-65% RV normal. Small effusion  - R/LHC (9/23): stable CAD, patent RCA stent and 40 % mLAD lesion RA 3, PA 21/3 (11), PCWP 4, CO/CI (Fick) 5.4/3.2, PVR 1.3 WU - Echo 2/23 : 55-60%  mild MR mild to mod AI  RV normal Personally reviewed - Echo 1/22 EF 55-60% mild MR, mild to mod AI Personally reviewed - PFTs 08/2016 FEV1 2.11 (88%) FVC 2.86 (91%) DLCO 63% - PFTs 12/20 FEV1 2.32 (99%) FVC 2.90 (94%) DLCO 85% - PFTs 12/22 FEV1 2.05L (90%) FVC 2.63L (87%) DLCO 76% - PFTs 6/23  FEV1 2.17 (95%) FVC 2.75 (91%) DLCO 82%  Past Medical History:  Diagnosis Date   Allergy    Arthritis    CAD (coronary artery disease)    a. DES to RCA 11/2015   Cancer [facility])    Uterine 2008   Cataract    CHF (congestive heart failure) (HCC)    GERD (gastroesophageal reflux disease)    Heart murmur    Hyperlipidemia    Osteoporosis    Thyroid  disease     Current Outpatient Medications  Medication Sig Dispense Refill   acetaminophen  (TYLENOL ) 500 MG tablet Take 500-1,000 mg by mouth every 6 (six) hours as needed (pain.).     albuterol  (VENTOLIN  HFA) 108 (90 Base) MCG/ACT inhaler Inhale 2 puffs into the lungs every 4 (four) hours as needed for wheezing or shortness of breath. 18 g 6   aspirin  EC 81 MG tablet Take 81 mg by mouth at bedtime.     atorvastatin  (LIPITOR) 40 MG tablet TAKE 1 TABLET BY MOUTH DAILY 100 tablet 2  Bacillus Coagulans-Inulin (PROBIOTIC) 1-250 BILLION-MG CAPS      Calcium  Carbonate-Vitamin D (CALCIUM -VITAMIN D3 PO) Take 1 tablet by mouth in the morning and at bedtime.     Cyanocobalamin (VITAMIN B-12 PO) Take 1 tablet by mouth in the morning.     famotidine  (PEPCID ) 20 MG tablet Take 20 mg by mouth in the morning.     fexofenadine (ALLEGRA) 180 MG tablet Take 180 mg by mouth as needed for allergies or rhinitis.     Glucos-Chond-MSM-Bor-D3-Hyalur (MOVE FREE JOINT HEALTH ADV + D PO)      levothyroxine  (SYNTHROID ) 75 MCG tablet TAKE 1 TABLET BY MOUTH DAILY  BEFORE BREAKFAST 100 tablet 2   losartan  (COZAAR ) 25 MG tablet TAKE 1 TABLET BY MOUTH DAILY 90 tablet 3   meclizine  (ANTIVERT ) 25 MG tablet Take 1 tablet (25 mg total) by mouth 3 (three) times daily as  needed for dizziness. 20 tablet 0   MIEBO 1.338 GM/ML SOLN      Multiple Vitamin (MULTI-VITAMINS) TABS Take 1 tablet by mouth in the morning.     pantoprazole  (PROTONIX ) 40 MG tablet Take 1 tablet (40 mg total) by mouth 2 (two) times daily. 180 tablet 3   REPATHA  SURECLICK 140 MG/ML SOAJ INJECT 1 PEN SUBCUTANEOUSLY  EVERY 2 WEEKS 6 mL 3   VAGINAL LUBRICANT VA Place vaginally.     valACYclovir (VALTREX) 500 MG tablet Take 500 mg by mouth See admin instructions. Take 1 tablet (500 mg) by mouth twice daily x 5 day at onset of outbreak.     triamcinolone  ointment (KENALOG ) 0.5 % Apply 1 Application topically 2 (two) times daily. (Patient not taking: Reported on 01/02/2024) 60 g 1   No current facility-administered medications for this encounter.     Allergies  Allergen Reactions   Avelox [Moxifloxacin Hcl In Nacl] Other (See Comments)    dizziness   Latex Rash    Mild sensitivity( Rash)per patient-    Other Other (See Comments)    SEASONAL ALLERGIES     Social History   Socioeconomic History   Marital status: Married    Spouse name: Rhonda Brewer   Number of children: 2   Years of education: 12   Highest education level: 12th grade  Occupational History   Occupation: retired    Comment: Research scientist (medical)  Tobacco Use   Smoking status: Never    Passive exposure: Past   Smokeless tobacco: Never  Vaping Use   Vaping status: Never Used  Substance and Sexual Activity   Alcohol use: Yes    Comment: occasional wine   Drug use: No   Sexual activity: Yes    Birth control/protection: Surgical    Comment: hysterectomy  Other Topics Concern   Not on file  Social History Narrative   Lives in home with husband. Daughters live out of state. Step-daughter in [place]. Has 8 grandchildren. Lives in 2 level home but most living is on first floor. Stairs have handrails, no problem with stairs. No grab bars in bathroom. No tripping hazards. Smoke alarms present.   No pets.    Eats a good variety  of foods, husband is a cook. Eats meats, fruits, vegetables. Drinks water mostly. Occasional tea.   Goes to gym 5 times a week. Enjoys reading, sewing, movies. Walking when weather is nice.    Wears seat belt in car.    Social Drivers of Health   Financial Resource Strain: Low Risk  (06/02/2023)   Overall Financial Resource Strain (CARDIA)  Difficulty of Paying Living Expenses: Not hard at all  Food Insecurity: No Food Insecurity (06/02/2023)   Hunger Vital Sign    Worried About Running Out of Food in the Last Year: Never true    Ran Out of Food in the Last Year: Never true  Transportation Needs: No Transportation Needs (06/02/2023)   PRAPARE - Administrator, Civil Service (Medical): No    Lack of Transportation (Non-Medical): No  Physical Activity: Sufficiently Active (06/02/2023)   Exercise Vital Sign    Days of Exercise per Week: 7 days    Minutes of Exercise per Session: 60 min  Stress: No Stress Concern Present (06/02/2023)   [employer] of Occupational Health - Occupational Stress Questionnaire    Feeling of Stress : Not at all  Social Connections: Unknown (06/02/2023)   Social Connection and Isolation Panel    Frequency of Communication with Friends and Family: More than three times a week    Frequency of Social Gatherings with Friends and Family: More than three times a week    Attends Religious Services: Patient declined    Database administrator or Organizations: Yes    Attends Engineer, structural: More than 4 times per year    Marital Status: Married  Catering manager Violence: Not At Risk (06/02/2023)   Humiliation, Afraid, Rape, and Kick questionnaire    Fear of Current or Ex-Partner: No    Emotionally Abused: No    Physically Abused: No    Sexually Abused: No    Family History  Problem Relation Age of Onset   Heart disease Mother    Diabetes Mother    Heart disease Father    Stomach cancer Brother    Diabetes Brother    Asthma  Maternal Aunt    Colon cancer Neg Hx    Esophageal cancer Neg Hx    Rectal cancer Neg Hx    Colon polyps Neg Hx    BP 118/64   Pulse 62   Wt 65.1 kg (143 lb 9.6 oz)   SpO2 97%   BMI 24.65 kg/m   Wt Readings from Last 3 Encounters:  01/02/24 65.1 kg (143 lb 9.6 oz)  12/31/23 64.1 kg (141 lb 6.4 oz)  12/29/23 63.5 kg (140 lb)   PHYSICAL EXAM: General:  NAD. No resp difficulty HEENT: Normal Neck: Supple. No JVD. Cor: Regular rate & rhythm. No rubs, gallops or murmurs. Lungs: Clear Abdomen: Soft, nontender, nondistended.  Extremities: No cyanosis, clubbing, rash, edema Neuro: Alert & oriented x 3, moves all 4 extremities w/o difficulty. Affect pleasant.  ECG (personally reviewed): NSR 62 bpm  ASSESSMENT & PLAN:  1. CAD  - s/p RCA stent in 11/2015 - cath 5/19 with mild non-obstructive CAD - Cath 9/23 with patent RCA stent, 40% stenosis pLAD  - No chest pain - Continue ASA - Continue atorvastatin  + Repatha .  - LDL 29 (4/25)  2. Scleroderma - DLCO mildly reduced on PFTs 6/18. No R heart strain on echo. CT without ILD.  - RHC 5/19 no PAH - PFTs 12/20 with normalization of DLCO.  - No evidence of scleroderma-related lung disease (PAH or fibrosis).  - Echo and PFTs stable. No evidence PAH - RHC 9/23 with normal right heart pressures with small step up between SVC and RA - Echo 8/24 EF 60-65% RV normal. Small effusion Mild AI  - Hi-res CT 11/24. No ILD - Chest CT 12/24 + GGO (see above) - Echo today 01/02/24, EF  60-65%, mild AI Personally reviewed - Continue to surveil with routine echos  3. Hypertension - Blood pressure well controlled.  - Continue losartan  25 mg daily - Labs reviewed from 12/31/23 and are stable, K4.3, SCr 0.86  4. GERD - Had EGD 4/24, has small hiatal hernia - On Pepcid  + Pantoprazole  40 daily - Symptoms stable. Can consider Voquenza if breakthrough despite PPI  Follow up in 1 year with Echo.  Harlene CHRISTELLA Gainer, FNP  12:13 PM   Patient seen  and examined with the above-signed Advanced Practice Provider and/or Housestaff. I personally reviewed laboratory data, imaging studies and relevant notes. I independently examined the patient and formulated the important aspects of the plan. I have edited the note to reflect any of my changes or salient points. I have personally discussed the plan with the patient and/or family.   Remains very active. Doing HIIT 1x/week. No CP or undue SOB. No recent scleroderma flares  ECHO today EF 60-65% mild AI. RV ok  LDL 29   PFTs pending next week   General:  Well appearing. No resp difficulty HEENT: normal Neck: supple. no JVD. Carotids 2+ bilat; no bruits. No lymphadenopathy or thryomegaly appreciated. Cor: PMI nondisplaced. Regular rate & rhythm. No rubs, gallops or murmurs. Lungs: clear Abdomen: soft, nontender, nondistended. No hepatosplenomegaly. No bruits or masses. Good bowel sounds. Extremities: no cyanosis, clubbing, rash, edema Neuro: alert & orientedx3, cranial nerves grossly intact. moves all 4 extremities w/o difficulty. Affect pleasant  Doing great. No evidence of angina. LDL at goal   Echo stable no evidence of PAH. AI stable.   Will continue to follow.   Toribio Fuel, MD  12:46 PM

## 2024-01-05 ENCOUNTER — Ambulatory Visit (INDEPENDENT_AMBULATORY_CARE_PROVIDER_SITE_OTHER): Admitting: Internal Medicine

## 2024-01-05 ENCOUNTER — Encounter: Payer: Self-pay | Admitting: Internal Medicine

## 2024-01-05 VITALS — BP 124/62 | HR 59 | Temp 98.3°F | Ht 60.0 in | Wt 144.0 lb

## 2024-01-05 DIAGNOSIS — M349 Systemic sclerosis, unspecified: Secondary | ICD-10-CM | POA: Diagnosis not present

## 2024-01-05 DIAGNOSIS — R0689 Other abnormalities of breathing: Secondary | ICD-10-CM

## 2024-01-05 DIAGNOSIS — R0600 Dyspnea, unspecified: Secondary | ICD-10-CM

## 2024-01-05 DIAGNOSIS — R918 Other nonspecific abnormal finding of lung field: Secondary | ICD-10-CM | POA: Diagnosis not present

## 2024-01-05 NOTE — Progress Notes (Signed)
 PCP Rhonda Elsie LABOR, MD   HPI  IOV 08/16/2016  Chief Complaint  Patient presents with   Advice Only    Referred by Dr. dolphus for scleroderma.  c/o worsening sob, chest tightness with exertion X1 month.    HPI 73 year old woman with history of scleroderma on hydroxychloroquine , CAD s/p stent 11/2015, uterine cancer dx in 2008 s/p hysterectomy, chemo/radtx presenting for evaluation of dyspnea on exertion.  She is followed by Dr. Monna for rheumatology, Dr. Bensimhon for cardiology. Previously was followed by Dr. Thornell in [place]. Last saw Dr. dolphus 06/19/2016. She has had scleroderma for 10 years and only has been on hydroxychloroquine . She moved to [facility] in January 2018.  For the past month, she has dyspnea with going up one flight of stairs most times. This is new. Also sometimes gets short of breath with bending over to pick something up. Denies cough. She does yoga, strength training, and walking for exercise. She can walk a couple of miles without any issues. She is independent in all her activities of daily living. Does not use any devices to aid in walking. Denies GERD. Has not had a pulmonologist.  She had shortness of breath that prompted the cardiac evaluation leading to stent placement. She has an appointment with cardiology June 12. Review of Systems  S: See resident physician for details. She has scleroderma for over 10 years for which she is on Actonel. Denies any associated acid reflux. Started on was believed only to involve the skin. I personally evaluated the history that this patient had a few months of shortness of breath in 2017 between summer and fall that then resulted in worsening dyspnea on exertion. That then resulted in a cardiac stent. After this dyspnea resolved. Then subsequently in January 2018 moved from the [place] area to [place], [place] . Now for the last 1 month she's having recurrent dyspnea on exertion. She feels this is from  the heart. She does not think is a lung issue. Relieved by rest. She notices it for climbing stairs relieved by rest. She has cardiology appointment pending. There is a pulmonary function test and echocardiogram pending on 08/27/2016. She is reluctant to get a CT chest. Walking desat test in office 185 feet x  3 laps on RA: 100% at rest and exertion  Recent pertinent labs  Results for Brewer, Rhonda (MRN [id number]) as of 08/16/2016 10:29  Ref. Range 06/19/2016 10:38 06/26/2016 10:04  Creatinine Latest Ref Range: 0.50 - 0.99 mg/dL 9.20   Results for Brewer, Rhonda (MRN [id number]) as of 08/16/2016 10:29  Ref. Range 06/19/2016 10:38 06/26/2016 10:04  Hemoglobin Latest Ref Range: 11.7 - 15.5 g/dL 85.1      has a past medical history of Cancer (HCC).   reports that she has never smoked. She has never used smokeless tobacco.   OV 11/05/2016  Chief Complaint  Patient presents with   Follow-up    Pt here after CT and PFT. Pt denies change in SOB since last OV. Pt denies cough, CP/tightness, f/c/s.     Follow-up scleroderma with associated shortness of breath   last seen in June 2018. At that time he thought the suspicion for interstitial lung disease was low. She had Pulm  function test that showed isolated reduction in diffusion capacity to 63%. This raises the possibility of interstitial lung disease but she did have a high-resolution CT scan of the chest that is documented below. Interstitial lung disease has been ruled out. This  no pulmonary parenchymal abnormality. Her hemoglobin was 14.8 g percent suggesting no anemia causing shortness of breath. She then followed up with cardiology. According to the echocardiogram she does not have pulmonary hypertension which can be seen and started on the patient's. She was on  BRILINTA  for CAD - this got changed to Plavix  and her dyspnea resolved. Currently she is doing well does not have any rest symptoms.   Results for Brewer, Rhonda (MRN [id number]) as of  11/05/2016 11:24  Ref. Range 08/27/2016 11:19  FVC-Pre Latest Units: L 2.86  FVC-%Pred-Pre Latest Units: % 91  FEV1-Pre Latest Units: L 2.11  FEV1-%Pred-Pre Latest Units: % 88  Pre FEV1/FVC ratio Latest Units: % 74    Results for Brewer, Rhonda (MRN [id number]) as of 11/05/2016 11:24  Ref. Range 08/27/2016 11:19  TLC Latest Units: L 4.64  TLC % pred Latest Units: % 91  Results for Brewer, Rhonda (MRN [id number]) as of 11/05/2016 11:24  Ref. Range 08/27/2016 11:19  DLCO unc Latest Units: ml/min/mmHg 15.42  DLCO unc % pred Latest Units: % 63   IMPRESSION: 1. No evidence of interstitial lung disease. No acute pulmonary disease . 2. Solitary 3 mm solid apical right upper lobe pulmonary nodule. No follow-up needed if patient is low-risk. Non-contrast chest CT can be considered in 12 months if patient is high-risk. This recommendation follows the consensus statement: Guidelines for Management of Incidental Pulmonary Nodules Detected on CT Images: From the Fleischner Society 2017; Radiology 2017; 284:228-243. 3. Small pericardial effusion/thickening. 4. Three-vessel coronary atherosclerosis.   Aortic Atherosclerosis (ICD10-I70.0).     Electronically Signed   By: Selinda DELENA Blue M.D.   On: 09/03/2016 15:18  OV 12/16/2017  Subjective:  Patient ID: Rhonda Brewer, female , DOB: 05/25/1950 , age 73 y.o. , MRN: [id number] , ADDRESS: [address] [place] [facility] [address]   12/16/2017 -   Chief Complaint  Patient presents with   Follow-up    Pt is here for a 1 year follow up and states she has been doing well. Pt denies any complaints.     HPI Rhonda Brewer 73 y.o. -1 year follow-up for ILD monitoring in the setting of scleroderma.  She says overall she is stable.  She is no longer on Plaquenil  for scleroderma.  She continues to have Raynaud.  She tells me other than extremely mild shortness of breath IV exertion she is stable.  There is no interim medical issues or surgical issues.  No change in  medications of significance.  No ER visits no hospitalizations.  Only interim medical issues carpal tunnel surgery.  She had a CT scan of the chest July 2019.  This is not a high-resolution CT chest.  On the report there is no evidence of ILD.  Last echocardiogram June 2018 without any pulmonary hypertension.  There is no wheezing cough orthopnea proximal nocturnal dyspnea.    OV 08/10/2020  Subjective:  Patient ID: Rhonda Brewer, female , DOB: 1950-06-17 , age 2 y.o. , MRN: [id number] , ADDRESS: [id number] [place] [facility] [address] PCP Rhonda Elsie DELENA, MD Patient Care Team: Rhonda Elsie DELENA, MD as PCP - General (Family Medicine) Bensimhon, Toribio SAUNDERS, MD as PCP - Advanced Heart Failure (Cardiology) Dolphus Reiter, MD as Consulting Physician (Rheumatology) Bensimhon, Toribio SAUNDERS, MD as Consulting Physician (Cardiology) Shila Gustav GAILS, MD as Consulting Physician (Gastroenterology) Geronimo Amel, MD as Consulting Physician (Pulmonary Disease) Octavia Bruckner, MD as Consulting Physician (Ophthalmology) Marne Kelly Nest, MD as Consulting Physician (Obstetrics and Gynecology)  This Provider for this visit: Treatment Team:  Attending Provider: Geronimo Amel, MD    08/10/2020 -   Chief Complaint  Patient presents with   Follow-up    Pt states she has been doing okay since last visit. Denies any issues with her breathing.   Follow-up scleroderma with very mild shortness of breath.  At risk for ILD.  - 2019 CT without ILD -2020 pulmonary function test with improvement/normal  HPI Rhonda Brewer 72 y.o. -returns for follow-up.  I personally saw her in 2019.  Then after that because of the pandemic I did not see her.  She did see nurse practitioner December 2020.  Pulmonary function test was normal//slightly improved/stable.  She is on expectant follow-up with monitoring.  In April 2022 she is a Dr. D her rheumatologist.  She asked to be established back in pulmonary to  see me.  Patient has continued ongoing issues with sclerodactyly and also Raynaud's.  In January 2022 she had echocardiogram that I visualized the result.  It shows valvular regurgitation is worse but no evidence of pulmonary hypertension.  She has very mild shortness of breath if at all for climbing stairs.  Symptom scores are not detailed below.  Walking desaturation test is stable compared to 3 years ago and normal.   PFT Subjective:  Patient ID: Rhonda Brewer, female , DOB: [birth year] , age 66 y.o. , MRN: [id number] , ADDRESS: [address] [place] [facility] [address] PCP Rhonda Elsie LABOR, MD Patient Care Team: Rhonda Elsie LABOR, MD as PCP - General (Family Medicine) Bensimhon, Toribio SAUNDERS, MD as PCP - Advanced Heart Failure (Cardiology) Dolphus Reiter, MD as Consulting Physician (Rheumatology) Bensimhon, Toribio SAUNDERS, MD as Consulting Physician (Cardiology) Shila Gustav GAILS, MD as Consulting Physician (Gastroenterology) Geronimo Amel, MD as Consulting Physician (Pulmonary Disease) Octavia Bruckner, MD as Consulting Physician (Ophthalmology) Marne Kelly Nest, MD as Consulting Physician (Obstetrics and Gynecology)  This Provider for this visit: Treatment Team:  Attending Provider: Geronimo Amel, MD    03/01/2021 -   Chief Complaint  Patient presents with   Follow-up    PFT performed today.  Pt states she has been doing okay since last visit and denies any complaints.   Follow-up scleroderma with very mild shortness of breath.  At risk for ILD.  - 2019 CT without ILD -2020 pulmonary function test with improvement/normal  HPI Rhonda Brewer 73 y.o. -returns for follow-up.  In the last 6 months she reports no change in her shortness of breath.  She admits to shortness of breath for stairs relieved by rest.  Last visit 2 she had the same thing but she scored 0 but she actually tells me she has shortness of breath with stairs relieved by rest.  There is no associated chest  pain.  She says this is stable without any worsening.  No cough.  She had pulmonary function test today it shows a slight drop in DLCO below 80%.  The first time there is a drop this low.  I did share these results with her.  Last CT scan of the chest was in 2019.  She is willing to have another CT scan of the chest  She is on pulmonary hypertension monitoring through Dr. Bensimhon.  Next echocardiogram was in March 2023 according to history.    CT Chest data  No results found.  OV 09/07/2021  Subjective:  Patient ID: Rhonda Brewer, female , DOB: 1950-03-24 , age 84 y.o. , MRN: [id number] , ADDRESS: [phone]  [place] [address] PCP Rhonda Elsie LABOR, MD Patient Care Team: Rhonda Elsie LABOR, MD as PCP - General (Family Medicine) Bensimhon, Toribio SAUNDERS, MD as PCP - Advanced Heart Failure (Cardiology) Dolphus Reiter, MD as Consulting Physician (Rheumatology) Bensimhon, Toribio SAUNDERS, MD as Consulting Physician (Cardiology) Shila Gustav GAILS, MD as Consulting Physician (Gastroenterology) Geronimo Amel, MD as Consulting Physician (Pulmonary Disease) Octavia Bruckner, MD as Consulting Physician (Ophthalmology) Marne Kelly Nest, MD as Consulting Physician (Obstetrics and Gynecology)  This Provider for this visit: Treatment Team:  Attending Provider: Geronimo Amel, MD    09/07/2021 -   Chief Complaint  Patient presents with   Follow-up    PFT performed today.  Pt states she has been doing okay since last visit and denies any complaints.   Follow-up scleroderma at risk for ILD.  Normal pulmonary function test.  HPI Rhonda Brewer 73 y.o. -returns for follow-up.  Last visit there was concern that the pulmonary function test was changing.  So we repeated it.  Brought in 68-month interval.  The test is actually normal.  High-resolution CT chest shows that there is no ILD.  She just has planned scarring.  She continues to feel well.  Very minimal to no symptoms.   Echocardiogram is being monitored by cardiology.  She has no new complaints.  No changes to health status.  She is content with the level of monitoring.  We briefly discussed possible enrollment in clinical trials: She might be interested We also discussed joining support group: We gave her the number for Arland the support group leader.  Of note CT scan shows possible cirrhosis of the liver.  Personally visualized the images of the pulmonary function test and CT scan.     OV 01/21/2023  Subjective:  Patient ID: Rhonda Brewer, female , DOB: Aug 01, 1950 , age 41 y.o. , MRN: [id number] , ADDRESS: [address] [place] [facility] [address] PCP Madelon Donald HERO, DO Patient Care Team: Madelon Donald HERO, DO as PCP - General (Family Medicine) Bensimhon, Toribio SAUNDERS, MD as PCP - Advanced Heart Failure (Cardiology) Dolphus Reiter, MD as Consulting Physician (Rheumatology) Bensimhon, Toribio SAUNDERS, MD as Consulting Physician (Cardiology) Shila Gustav GAILS, MD as Consulting Physician (Gastroenterology) Geronimo Amel, MD as Consulting Physician (Pulmonary Disease) Octavia Bruckner, MD as Consulting Physician (Ophthalmology) Marne Kelly Nest, MD as Consulting Physician (Obstetrics and Gynecology)  This Provider for this visit: Treatment Team:  Attending Provider: Geronimo Amel, MD    01/21/2023 -   Chief Complaint  Patient presents with   Follow-up    Pft f/u   Scleroderma without ILD but mild bland scarring on surveillance  HPI Rhonda Brewer 73 y.o. -returns for follow-up.  Last seen in June 2023.  She tells me that since then she has had insidious onset of shortness of breath.  Present walking stairs.  It was always mild but now it is more noticeable.  She is also having mild chronic dry cough at night there is no wheezing.  This cough is also more noticeable.  Definitely symptoms are progressive in the last 6 months.  Symptom score below clearly shows that the symptoms were absent  in 2022 and now present and more prominent.  She had an echocardiogram in August 2024 and it is normal.  She had pulmonary function test today that I reviewed and it shows a decline FVC is now 2.5 L / 86% and DLCO is now 13.98/72%.  There is a change.  I visualized the previous CT scan and agreed with  the previous findings.  I did indicate to her that I am concerned about potential ILD developing.  She also has seasonal allergies we will check a CBC with differential blood IgE She indicated to me she has family Struve diabetes and wants her hemoglobin A1c checked.   Nw   OV 02/20/2023  Subjective:  Patient ID: Rhonda Brewer, female , DOB: 12-20-1950 , age 73 y.o. , MRN: [id number] , ADDRESS: [address] [place] [address] PCP Madelon Donald HERO, DO Patient Care Team: Madelon Donald HERO, DO as PCP - General (Family Medicine) Bensimhon, Toribio SAUNDERS, MD as PCP - Advanced Heart Failure (Cardiology) Dolphus Reiter, MD as Consulting Physician (Rheumatology) Bensimhon, Toribio SAUNDERS, MD as Consulting Physician (Cardiology) Shila Gustav GAILS, MD as Consulting Physician (Gastroenterology) Geronimo Amel, MD as Consulting Physician (Pulmonary Disease) Octavia Bruckner, MD as Consulting Physician (Ophthalmology) Marne Kelly Nest, MD as Consulting Physician (Obstetrics and Gynecology)  This Provider for this visit: Treatment Team:  Attending Provider: Geronimo Amel, MD  Type of visit: Video Virtual Visit Identification of patient Rhonda Brewer with 16-Mar-1951 and MRN [id number] - 2 person identifier Risks: Risks, benefits, limitations of telephone visit explained. Patient understood and verbalized agreement to proceed Anyone else on call: jus patient Patient location: her home This provider location: [address]; [place]; [place] [address]. [facility]. [phone]     02/20/2023 -  scleroderma patinet    HPI Rhonda Brewer 73 y.o.  -presents for this video visit to discuss test results.  Last visit perhaps she was a little more short of breath but perhaps not but pulmonary function test suggestive of decline.  Therefore we did a blood BNP this is slightly high but otherwise normal.  Hemoglobin A1c was normal.  Anemia test was normal.  Recent echo this year was also normal.  She open overall she is continuing to do stable and no interim complaints.  CT scan visualized personally and there is no evidence of ILD.  I showed this to her and also the June 2023 high-resolution CT chest.  Unfortunately the official report is still pending.  Nevertheless overall because of his symptom stability and no interim issues and in my personal visualization no development of ILD without this was reassuring.  She is going to monitor her symptoms and come back for follow-up in approximately 9 months with a breathing test.     CT Chest data from date: 111/27/24  - personally visualized and independently interpreted : yes - my findings are: no ILD. -official report pending    OV 08/28/2023  Subjective:  Patient ID: Rhonda Brewer, female , DOB: November 26, 1950 , age 30 y.o. , MRN: [id number] , ADDRESS: [address] [place] [facility] [address] PCP Madelon Donald HERO, DO Patient Care Team: Madelon Donald HERO, DO as PCP - General (Family Medicine) Bensimhon, Toribio SAUNDERS, MD as PCP - Advanced Heart Failure (Cardiology) Dolphus Reiter, MD as Consulting Physician (Rheumatology) Bensimhon, Toribio SAUNDERS, MD as Consulting Physician (Cardiology) Shila Gustav GAILS, MD as Consulting Physician (Gastroenterology) Geronimo Amel, MD as Consulting Physician (Pulmonary Disease) Octavia Bruckner, MD as Consulting Physician (Ophthalmology) Marne Kelly Nest, MD as Consulting Physician (Obstetrics and Gynecology)  This Provider for this visit: Treatment Team:  Attending Provider: Geronimo Amel, MD    08/28/2023 -   Chief Complaint  Patient presents  with   Follow-up  Scleroderma patient   HPI Rhonda Brewer 73 y.o. -scleroderma patient here for routine follow-up.  I last saw her in November  2020 for and at that time high-resolution CT chest was clear without any ILD.  However March 07, 2023 she ended up in the ED because of atypical chest pain and also vertigo and dizziness and cough.  She ended up getting a CT angiogram chest that ruled out pulmonary embolism but showed significant groundglass opacities.  She was discharged with Z-Pak.  She subsequently did see Dr. Toribio Fuel 04/22/2023.  At that time she was still having cough that was getting worse when lying down.  She also reported increasing shortness of breath.  She was reporting increased acid reflux no recurrent chest pain no edema.  Dr. Theda more than doubled up her pantoprazole .  At this point in time she says that her cough is back to baseline significantly improved.  Shortness of breath is stable.  She does have nonspecific fatigue by end of the day.  She saw Dr. Fuel April 2025 and told him that she is going to the gym regularly without any chest pain or undue shortness of breath.  She is also followed up 07/31/2023 with rheumatology.  At this point in time she is stable.  She did a sit/stand exercise hypoxemia test.  It was essentially normal.   Given multiple CT scans last had opted for a more conservative line.  Will see her in more close follow-up.  Will get a PFT in September 2025 and if it is stable we will get a high-resolution CT chest in December 2025     New isssue is GGO  CT Chest data from date: dec 2024*  - personally visualized and independently interpreted : yes - my findings are:  GGO but somewhat false poisitve due to CTA Narrative & Impression  CLINICAL DATA:  SOB, weakness, tightness of the chest.   EXAM: CT ANGIOGRAPHY CHEST WITH CONTRAST   TECHNIQUE: Multidetector CT imaging of the chest was performed using the standard protocol during  bolus administration of intravenous contrast. Multiplanar CT image reconstructions and MIPs were obtained to evaluate the vascular anatomy.   RADIATION DOSE REDUCTION: This exam was performed according to the departmental dose-optimization program which includes automated exposure control, adjustment of the mA and/or kV according to patient size and/or use of iterative reconstruction technique.   CONTRAST:  75mL OMNIPAQUE  IOHEXOL  350 MG/ML SOLN   COMPARISON:  02/12/2023.   FINDINGS: Cardiovascular: No pulmonary artery filling defects to indicate PE. Cardiomegaly. No aortic aneurysm. Atheromatous calcifications aorta and coronary arteries. Reflux contrast into the IVC consistent with tricuspid insufficiency.   Mediastinum/Nodes: No suspicious mediastinal adenopathy. Diminutive thyroid . Unremarkable tracheobronchial tree and esophagus.   Lungs/Pleura: Patchy ground-glass opacities consistent with pneumonitis or edema. No focal consolidation. No pneumothorax or pleural effusion.   Upper Abdomen: No acute abnormality.   Musculoskeletal: Thoracic degenerative changes.   Review of the MIP images confirms the above findings.   IMPRESSION: 1. No evidence of PE. 2. Cardiomegaly. 3. Patchy ground-glass opacities consistent with pneumonitis or edema.     Electronically Signed   By: Fonda Field M.D.   On: 03/15/2023 19:13      OV 01/05/2024  Subjective:  Patient ID: Rhonda Brewer, female , DOB: [birth year] , age 29 y.o. , MRN: [id number] , ADDRESS: [address] [place] [facility] [address] PCP Madelon Donald HERO, DO Patient Care Team: Madelon Donald HERO, DO as PCP - General (Family Medicine) Bensimhon, Toribio SAUNDERS, MD as PCP - Advanced Heart Failure (Cardiology) Bensimhon, Toribio SAUNDERS, MD as Consulting Physician (Cardiology) Shila Gustav GAILS, MD as Consulting  Physician (Gastroenterology) Geronimo Amel, MD as Consulting Physician (Pulmonary Disease) Octavia Bruckner, MD  as Consulting Physician (Ophthalmology) Marne Kelly Nest, MD as Consulting Physician (Obstetrics and Gynecology)  This Provider for this visit: Treatment Team:  Attending Provider: Geronimo Amel, MD    01/05/2024 -   Chief Complaint  Patient presents with   scleroderma    Pt stated pt stated breathing has been good, no issues       HPI Rhonda Brewer 72 y.o. -presents for follow-up.  Last seen in June 2025.  She has scleroderma and was on monitoring but end of last year she developed pulmonary infiltrates that was presumably because of acid reflux.  She is supposed to get pulmonary function test as part of monitoring today but our office did not schedule this.  In any event she has had a flu shot. .Interim Health status: No new complaints No new medical problems. No new surgeries. No ER visits. No Urgent care visits. No changes to medications.  She denies any shortness of breath or cough.  She does activities of daily living.  Other issues based on external medical review: - 12/31/2022 Cheryl Birmingham rheumatology PA-currently not taking immunosuppressive agents.  Monitor with routine lab work - 01/02/2024:  Bensimhon heart failure specialist: Echocardiogram 01/02/2024 normal ejection fraction.  Plan to come back in 1 year.  SYMPTOM SCALE  08/10/2020  03/01/2021  01/21/2023  08/28/2023   O2 use ra ra ra ra  Shortness of Breath 0 -> 5 scale with 5 being worst (score 6 If unable to do)     At rest 0 0 0 0  Simple tasks - showers, clothes change, eating, shaving 0 0 0 0  Household (dishes, doing bed, laundry) 000 0 1 1  Shopping 0 0 0 0  Walking level at own pace 0 0 0 0  Walking up Stairs 0 1 2 2   Total (30-36) Dyspnea Score 00 1 3 3   How bad is your cough? 0 0 2 1  How bad is your fatigue 0 0 0 2  How bad is nausea 00 0 0 0  How bad is vomiting?  0 0 0 00  How bad is diarrhea? 0 0 0 0  How bad is anxiety? 0 0 0 0  How bad is depression 0 0 0 0    Simple office walk 185  feet x  3 laps goal with forehead probe 12/16/2017  08/10/2020  01/21/2023  08/28/2023   O2 used Room air ra ra Room air forehead probe  Number laps completed 3 3 Sit stand x 15 Sit stand x 15  Comments about pace Normal brisk avg space  40 seconds  Resting Pulse Ox/HR 99% and 65/min 98% and HR 67 96% and HR 70 98% and heart rate 64  Final Pulse Ox/HR 99% and 74/min 98% and HR 82 95% and HR 77 90% and heart rate 86  Desaturated </= 88% no no  no  Desaturated <= 3% points no no  no  Got Tachycardic >/= 90/min no no  no  Symptoms at end of test non3 No complaints  No complaints no dyspnea at all.  Miscellaneous comments Normal test           SIT STAND TEST - goal 15 times   01/05/2024    O2 used ra   PRobe - finter or forehead forehead   Number sit and stand completed - goal 15 15   Time taken to complete 35 sec  Resting Pulse Ox/HR/Dyspnea  100% and 65/min and dyspnea of 0/10    Peak measures 100 % and 65/min and dyspnea of 0/10   Final Pulse Ox/HR 100% and 67/min and dyspnea of 0/10   Desaturated </= 88% no   Desaturated <= 3% points no   Got Tachycardic >/= 90/min yes   Miscellaneous comments no      PFT     Latest Ref Rng & Units 01/21/2023   10:55 AM 09/07/2021   11:39 AM 03/01/2021    2:50 PM 03/03/2019    2:00 PM 08/27/2016   11:19 AM  PFT Results  FVC-Pre L 2.54  2.75  2.63  2.90  2.86   FVC-Predicted Pre % 86  91  87  94  91   FVC-Post L  2.69    2.98   FVC-Predicted Post %  89    94   Pre FEV1/FVC % % 74  79  78  80  74   Post FEV1/FCV % %  83    82   FEV1-Pre L 1.88  2.17  2.05  2.32  2.11   FEV1-Predicted Pre % 85  95  90  99  88   FEV1-Post L  2.23    2.45   DLCO uncorrected ml/min/mmHg 13.98  16.20  14.84  16.78  15.42   DLCO UNC% % 72  83  76  85  63   DLCO corrected ml/min/mmHg 13.98  16.10  14.84     DLCO COR %Predicted % 72  82  76     DLVA Predicted % 77  85  85  93  70   TLC L  4.91    4.64   TLC % Predicted %  97    91   RV % Predicted %   93    80        LAB RESULTS last 96 hours ECHOCARDIOGRAM COMPLETE Result Date: 01/02/2024    ECHOCARDIOGRAM REPORT   Patient Name:   Rhonda Brewer Date of Exam: 01/02/2024 Medical Rec #:  [id number]     Height:       64.0 in Accession #:    [id number]    Weight:       141.4 lb Date of Birth:  1950-10-14    BSA:          1.688 m Patient Age:    73 years      BP:           121/72 mmHg Patient Gender: F             HR:           60 bpm. Exam Location:  Outpatient Procedure: 2D Echo, 3D Echo, Cardiac Doppler, Color Doppler and Strain Analysis            (Both Spectral and Color Flow Doppler were utilized during            procedure). Indications:    Scleroderma M34.9  History:        Patient has prior history of Echocardiogram examinations, most                 recent 10/28/2022. CHF, CAD; Risk Factors:Hypertension and                 Dyslipidemia.  Sonographer:    Damien Senior RDCS Referring Phys: DONALD HERO RUMBALL IMPRESSIONS  1. Left ventricular ejection fraction, by estimation, is 55 to 60%. Left  ventricular ejection fraction by 3D volume is 57 %. The left ventricle has normal function. The left ventricle has no regional wall motion abnormalities. Left ventricular diastolic  parameters were normal. The average left ventricular global longitudinal strain is -21.1 %. The global longitudinal strain is normal.  2. Right ventricular systolic function is normal. The right ventricular size is normal. There is normal pulmonary artery systolic pressure.  3. Right atrial size was mildly dilated.  4. The mitral valve is normal in structure. Trivial mitral valve regurgitation.  5. The aortic valve is tricuspid. Aortic valve regurgitation is trivial.  6. The inferior vena cava is normal in size with greater than 50% respiratory variability, suggesting right atrial pressure of 3 mmHg. Comparison(s): No significant change from prior study. Prior images reviewed side by side. FINDINGS  Left Ventricle: Left ventricular  ejection fraction, by estimation, is 55 to 60%. Left ventricular ejection fraction by 3D volume is 57 %. The left ventricle has normal function. The left ventricle has no regional wall motion abnormalities. The average left ventricular global longitudinal strain is -21.1 %. Strain was performed and the global longitudinal strain is normal. The left ventricular internal cavity size was normal in size. There is no left ventricular hypertrophy. Left ventricular diastolic parameters were normal. Right Ventricle: The right ventricular size is normal. No increase in right ventricular wall thickness. Right ventricular systolic function is normal. There is normal pulmonary artery systolic pressure. The tricuspid regurgitant velocity is 2.03 m/s, and  with an assumed right atrial pressure of 3 mmHg, the estimated right ventricular systolic pressure is 19.5 mmHg. Left Atrium: Left atrial size was normal in size. Right Atrium: Right atrial size was mildly dilated. Pericardium: There is no evidence of pericardial effusion. Mitral Valve: The mitral valve is normal in structure. Trivial mitral valve regurgitation. Tricuspid Valve: The tricuspid valve is normal in structure. Tricuspid valve regurgitation is mild. Aortic Valve: The aortic valve is tricuspid. Aortic valve regurgitation is trivial. Pulmonic Valve: The pulmonic valve was grossly normal. Pulmonic valve regurgitation is trivial. Aorta: The aortic root and ascending aorta are structurally normal, with no evidence of dilitation. Venous: The inferior vena cava is normal in size with greater than 50% respiratory variability, suggesting right atrial pressure of 3 mmHg. IAS/Shunts: No atrial level shunt detected by color flow Doppler. Additional Comments: 3D was performed not requiring image post processing on an independent workstation and was normal.  LEFT VENTRICLE PLAX 2D LVIDd:         4.60 cm         Diastology LVIDs:         3.20 cm         LV e' medial:    7.07 cm/s  LV PW:         0.90 cm         LV E/e' medial:  7.8 LV IVS:        0.80 cm         LV e' lateral:   11.00 cm/s LVOT diam:     2.00 cm         LV E/e' lateral: 5.0 LV SV:         65 LV SV Index:   39              2D Longitudinal LVOT Area:     3.14 cm        Strain LV IVRT:       134 msec  2D Strain GLS   -19.0 %                                (A4C):                                2D Strain GLS   -21.8 %                                (A3C):                                2D Strain GLS   -22.5 %                                (A2C):                                2D Strain GLS   -21.1 %                                Avg:                                 3D Volume EF                                LV 3D EF:    Left                                             ventricul                                             ar                                             ejection                                             fraction                                             by 3D                                             volume is  57 %.                                 3D Volume EF:                                3D EF:        57 %                                LV EDV:       105 ml                                LV ESV:       45 ml                                LV SV:        60 ml RIGHT VENTRICLE RV S prime:     10.70 cm/s TAPSE (M-mode): 2.3 cm LEFT ATRIUM             Index        RIGHT ATRIUM           Index LA diam:        3.10 cm 1.84 cm/m   RA Area:     21.70 cm LA Vol (A2C):   58.4 ml 34.62 ml/m  RA Volume:   64.80 ml  38.38 ml/m LA Vol (A4C):   45.6 ml 26.98 ml/m LA Biplane Vol: 54.2 ml 32.10 ml/m  AORTIC VALVE LVOT Vmax:   90.20 cm/s LVOT Vmean:  63.000 cm/s LVOT VTI:    0.207 m  AORTA Ao Root diam: 2.60 cm Ao Asc diam:  2.50 cm MITRAL VALVE               TRICUSPID VALVE MV Area (PHT): 3.28 cm    TR Peak grad:   16.5 mmHg MV Decel Time: 231 msec    TR Vmax:         203.00 cm/s MV E velocity: 54.80 cm/s MV A velocity: 55.30 cm/s  SHUNTS MV E/A ratio:  0.99        Systemic VTI:  0.21 m                            Systemic Diam: 2.00 cm Mihai Croitoru MD Electronically signed by Jerel Balding MD Signature Date/Time: 01/02/2024/1:48:57 PM    Final          has a past medical history of Allergy, Arthritis, CAD (coronary artery disease), Cancer (HCC), Cataract, CHF (congestive heart failure) (HCC), GERD (gastroesophageal reflux disease), Heart murmur, Hyperlipidemia, Osteoporosis, and Thyroid  disease.   reports that she has never smoked. She has been exposed to tobacco smoke. She has never used smokeless tobacco.  Past Surgical History:  Procedure Laterality Date   ABDOMINAL HYSTERECTOMY     APPENDECTOMY     CARDIAC ELECTROPHYSIOLOGY MAPPING AND ABLATION  04/19/2003   CARPAL TUNNEL RELEASE Right 10/15/2017   CARPAL TUNNEL RELEASE Left 06/14/2022   CATARACT EXTRACTION Bilateral    07/2021 R 08/2021 L   COLONOSCOPY     CORONARY ANGIOPLASTY WITH  STENT PLACEMENT     DE QUERVAIN'S RELEASE Left 06/14/2022   RIGHT/LEFT HEART CATH AND CORONARY ANGIOGRAPHY [name] 08/14/2017   Procedure: RIGHT/LEFT HEART CATH AND CORONARY ANGIOGRAPHY;  Surgeon: Cherrie Toribio SAUNDERS, MD;  Location: [facility] INVASIVE CV LAB;  Service: Cardiovascular;  Laterality: [name];   RIGHT/LEFT HEART CATH AND CORONARY ANGIOGRAPHY [name] 11/20/2021   Procedure: RIGHT/LEFT HEART CATH AND CORONARY ANGIOGRAPHY;  Surgeon: Cherrie Toribio SAUNDERS, MD;  Location: [facility] INVASIVE CV LAB;  Service: Cardiovascular;  Laterality: [name];   ULTRASOUND GUIDANCE FOR VASCULAR ACCESS  08/14/2017   Procedure: Ultrasound Guidance For Vascular Access;  Surgeon: Cherrie Toribio SAUNDERS, MD;  Location: [facility] INVASIVE CV LAB;  Service: Cardiovascular;;   UPPER GI ENDOSCOPY  07/15/2022    Allergies  Allergen Reactions   Avelox [Moxifloxacin Hcl In Nacl] Other (See Comments)    dizziness   Latex Rash    Mild sensitivity( Rash)per patient-     Other Other (See Comments)    SEASONAL ALLERGIES     Immunization History  Administered Date(s) Administered   Fluad Quad(high Dose 65+) 11/29/2019, 12/07/2020, 12/31/2021   INFLUENZA, HIGH DOSE SEASONAL PF 12/08/2023   Influenza Split 01/17/2016   Influenza, Seasonal, Injecte, Preservative Fre 02/20/2012   Influenza,inj,Quad PF,6+ Mos 11/12/2018   Influenza-Unspecified 04/02/2017, 12/08/2017, 12/05/2022   Moderna Covid-19 Vaccine Bivalent Booster 43yrs & up 01/31/2021   Moderna SARS-COV2 Booster Vaccination 01/13/2020, 09/25/2020   Moderna Sars-Covid-2 Vaccination 04/09/2019, 05/14/2019   Pneumococcal Conjugate-13 10/26/2014   Pneumococcal Polysaccharide-23 11/12/2018   Pneumococcal-Unspecified 09/14/2013   Td 07/18/2011   Unspecified SARS-COV-2 Vaccination 02/19/2022, 12/05/2022    Family History  Problem Relation Age of Onset   Heart disease Mother    Diabetes Mother    Heart disease Father    Stomach cancer Brother    Diabetes Brother    Asthma Maternal Aunt    Colon cancer Neg Hx    Esophageal cancer Neg Hx    Rectal cancer Neg Hx    Colon polyps Neg Hx      Current Outpatient Medications:    acetaminophen  (TYLENOL ) 500 MG tablet, Take 500-1,000 mg by mouth every 6 (six) hours as needed (pain.)., Disp: , Rfl:    albuterol  (VENTOLIN  HFA) 108 (90 Base) MCG/ACT inhaler, Inhale 2 puffs into the lungs every 4 (four) hours as needed for wheezing or shortness of breath., Disp: 18 g, Rfl: 6   aspirin  EC 81 MG tablet, Take 81 mg by mouth at bedtime., Disp: , Rfl:    atorvastatin  (LIPITOR) 40 MG tablet, TAKE 1 TABLET BY MOUTH DAILY, Disp: 100 tablet, Rfl: 2   Bacillus Coagulans-Inulin (PROBIOTIC) 1-250 BILLION-MG CAPS, , Disp: , Rfl:    Calcium  Carbonate-Vitamin D (CALCIUM -VITAMIN D3 PO), Take 1 tablet by mouth in the morning and at bedtime., Disp: , Rfl:    Cyanocobalamin (VITAMIN B-12 PO), Take 1 tablet by mouth in the morning., Disp: , Rfl:    famotidine  (PEPCID ) 20 MG  tablet, Take 20 mg by mouth in the morning., Disp: , Rfl:    fexofenadine (ALLEGRA) 180 MG tablet, Take 180 mg by mouth as needed for allergies or rhinitis., Disp: , Rfl:    Glucos-Chond-MSM-Bor-D3-Hyalur (MOVE FREE JOINT HEALTH ADV + D PO), , Disp: , Rfl:    levothyroxine  (SYNTHROID ) 75 MCG tablet, TAKE 1 TABLET BY MOUTH DAILY  BEFORE BREAKFAST, Disp: 100 tablet, Rfl: 2   losartan  (COZAAR ) 25 MG tablet, TAKE 1 TABLET BY MOUTH DAILY, Disp: 90 tablet, Rfl: 3   meclizine  (ANTIVERT )  25 MG tablet, Take 1 tablet (25 mg total) by mouth 3 (three) times daily as needed for dizziness., Disp: 20 tablet, Rfl: 0   MIEBO 1.338 GM/ML SOLN, , Disp: , Rfl:    Multiple Vitamin (MULTI-VITAMINS) TABS, Take 1 tablet by mouth in the morning., Disp: , Rfl:    pantoprazole  (PROTONIX ) 40 MG tablet, Take 1 tablet (40 mg total) by mouth 2 (two) times daily., Disp: 180 tablet, Rfl: 3   REPATHA  SURECLICK 140 MG/ML SOAJ, INJECT 1 PEN SUBCUTANEOUSLY  EVERY 2 WEEKS, Disp: 6 mL, Rfl: 3   triamcinolone  ointment (KENALOG ) 0.5 %, Apply 1 Application topically 2 (two) times daily., Disp: 60 g, Rfl: 1   VAGINAL LUBRICANT VA, Place vaginally., Disp: , Rfl:    valACYclovir (VALTREX) 500 MG tablet, Take 500 mg by mouth See admin instructions. Take 1 tablet (500 mg) by mouth twice daily x 5 day at onset of outbreak., Disp: , Rfl:       Objective:   Vitals:   01/05/24 0822  BP: 124/62  Pulse: (!) 59  Temp: 98.3 F (36.8 C)  TempSrc: Oral  SpO2: 99%  Weight: 144 lb (65.3 kg)  Height: 5' (1.524 m)    Estimated body mass index is 28.12 kg/m as calculated from the following:   Height as of this encounter: 5' (1.524 m).   Weight as of this encounter: 144 lb (65.3 kg).  @WEIGHTCHANGE @  [employer]   01/05/24 0822  Weight: 144 lb (65.3 kg)     Physical Exam   General: No distress. Looks well O2 at rest: no Cane present: no Sitting in wheel chair: no Frail: no Obese: o Neuro: Alert and Oriented x 3. GCS 15.  Speech normal Psych: Pleasant Resp:  Barrel Chest - no.  Wheeze - no, Crackles - no, No overt respiratory distress CVS: Normal heart sounds. Murmurs - no Ext: Stigmata of Connective Tissue Disease - no HEENT: Normal upper airway. PEERL +. No post nasal drip        Assessment/     Assessment & Plan Scleroderma (HCC)  Dyspnea and respiratory abnormalities  Pulmonary infiltrate present on computed tomography    PLAN Patient Instructions     ICD-10-CM   1. Scleroderma (HCC)  M34.9     2. Dyspnea and respiratory abnormalities  R06.00    R06.89        November 2024 CT chest without any interstitial lung disease and clear but in December 2024 at ER visit CT angiogram rule out pulmonary embolism but did have groundglass opacities.    You seem to have recovered from that after increasing her acid reflux medicine.  So I suspect you had acid reflux related pneumonitis and you are better right now.  Clinically I do not suspect any development of interstitial lung disease based on the fact you are better, no crackles and exercise hypoxemia test is normal.  However, we need to keep a close eye   Plan -High-resolution CT chest in December 2025   followup - Dec 2025/Jan 2026 -15 minute visit with aPP to review CT chest results    FOLLOWUP    Return for - Dec 2025/Jan 2026 -15 minute visit with aPP to review CT chest results.    SIGNATURE    Dr. Dorethia Cave, M.D., F.C.C.P,  Pulmonary and Critical Care Medicine Staff Physician, [facility] Center Director - Interstitial Lung Disease  Program  Pulmonary Fibrosis [facility] at [facility] [place], [place],  [address]  Pager: [phone], If no answer or between  15:00h - 7:00h: call [phone] Telephone: [phone]  8:41 AM 01/05/2024

## 2024-01-05 NOTE — Patient Instructions (Addendum)
 ICD-10-CM   1. Scleroderma (HCC)  M34.9     2. Dyspnea and respiratory abnormalities  R06.00    R06.89        November 2024 CT chest without any interstitial lung disease and clear but in December 2024 at ER visit CT angiogram rule out pulmonary embolism but did have groundglass opacities.    You seem to have recovered from that after increasing her acid reflux medicine.  So I suspect you had acid reflux related pneumonitis and you are better right now.  Clinically I do not suspect any development of interstitial lung disease based on the fact you are better, no crackles and exercise hypoxemia test is normal.  However, we need to keep a close eye   Plan -High-resolution CT chest in December 2025   followup - Dec 2025/Jan 2026 -15 minute visit with aPP to review CT chest results

## 2024-01-08 DIAGNOSIS — D2371 Other benign neoplasm of skin of right lower limb, including hip: Secondary | ICD-10-CM | POA: Diagnosis not present

## 2024-01-08 DIAGNOSIS — D2272 Melanocytic nevi of left lower limb, including hip: Secondary | ICD-10-CM | POA: Diagnosis not present

## 2024-01-08 DIAGNOSIS — L814 Other melanin hyperpigmentation: Secondary | ICD-10-CM | POA: Diagnosis not present

## 2024-01-08 DIAGNOSIS — L821 Other seborrheic keratosis: Secondary | ICD-10-CM | POA: Diagnosis not present

## 2024-01-12 ENCOUNTER — Encounter: Payer: Self-pay | Admitting: Rheumatology

## 2024-01-12 NOTE — Telephone Encounter (Signed)
 Any color changes? Ulcers? Numbness/tingling?   Joint pain? Swelling?

## 2024-01-21 ENCOUNTER — Ambulatory Visit: Admitting: Family Medicine

## 2024-01-21 ENCOUNTER — Encounter: Payer: Self-pay | Admitting: Family Medicine

## 2024-01-28 ENCOUNTER — Encounter: Payer: Self-pay | Admitting: Family Medicine

## 2024-01-28 ENCOUNTER — Ambulatory Visit: Admitting: Family Medicine

## 2024-01-28 VITALS — BP 135/57 | Ht 64.0 in | Wt 140.0 lb

## 2024-01-28 DIAGNOSIS — M25512 Pain in left shoulder: Secondary | ICD-10-CM

## 2024-01-28 NOTE — Progress Notes (Addendum)
 Established Patient Office Visit  PCP: Madelon Donald HERO, DO  Patient is a 73 y.o. female here for follow up of left shoulder pain attributed to rotator cuff impingement.  She was last evaluated on 10/13 at which time she endorsed worsening pain with difficulty sleeping.  She was given a subacromial corticosteroid injection and told to resume her home exercise program after 5 days.  Today she states that her pain has significantly improved.  She states it is approximately 95% better.  She does not have any discomfort at night or with overhead activity.  Occasionally she experiences discomfort with cross body adduction.  She does not have any additional concerns to discuss.  Past Medical History:  Diagnosis Date   Allergy    Arthritis    CAD (coronary artery disease)    a. DES to RCA 11/2015   Cancer [facility])    Uterine 2008   Cataract    CHF (congestive heart failure) (HCC)    GERD (gastroesophageal reflux disease)    Heart murmur    Hyperlipidemia    Osteoporosis    Thyroid  disease     Current Outpatient Medications on File Prior to Visit  Medication Sig Dispense Refill   acetaminophen  (TYLENOL ) 500 MG tablet Take 500-1,000 mg by mouth every 6 (six) hours as needed (pain.).     albuterol  (VENTOLIN  HFA) 108 (90 Base) MCG/ACT inhaler Inhale 2 puffs into the lungs every 4 (four) hours as needed for wheezing or shortness of breath. 18 g 6   aspirin  EC 81 MG tablet Take 81 mg by mouth at bedtime.     atorvastatin  (LIPITOR) 40 MG tablet TAKE 1 TABLET BY MOUTH DAILY 100 tablet 2   Bacillus Coagulans-Inulin (PROBIOTIC) 1-250 BILLION-MG CAPS      Calcium  Carbonate-Vitamin D (CALCIUM -VITAMIN D3 PO) Take 1 tablet by mouth in the morning and at bedtime.     Cyanocobalamin (VITAMIN B-12 PO) Take 1 tablet by mouth in the morning.     famotidine  (PEPCID ) 20 MG tablet Take 20 mg by mouth in the morning.     fexofenadine (ALLEGRA) 180 MG tablet Take 180 mg by mouth as needed for allergies or  rhinitis.     Glucos-Chond-MSM-Bor-D3-Hyalur (MOVE FREE JOINT HEALTH ADV + D PO)      levothyroxine  (SYNTHROID ) 75 MCG tablet TAKE 1 TABLET BY MOUTH DAILY  BEFORE BREAKFAST 100 tablet 2   losartan  (COZAAR ) 25 MG tablet TAKE 1 TABLET BY MOUTH DAILY 90 tablet 3   meclizine  (ANTIVERT ) 25 MG tablet Take 1 tablet (25 mg total) by mouth 3 (three) times daily as needed for dizziness. 20 tablet 0   MIEBO 1.338 GM/ML SOLN      Multiple Vitamin (MULTI-VITAMINS) TABS Take 1 tablet by mouth in the morning.     pantoprazole  (PROTONIX ) 40 MG tablet Take 1 tablet (40 mg total) by mouth 2 (two) times daily. 180 tablet 3   REPATHA  SURECLICK 140 MG/ML SOAJ INJECT 1 PEN SUBCUTANEOUSLY  EVERY 2 WEEKS 6 mL 3   triamcinolone  ointment (KENALOG ) 0.5 % Apply 1 Application topically 2 (two) times daily. 60 g 1   VAGINAL LUBRICANT VA Place vaginally.     valACYclovir (VALTREX) 500 MG tablet Take 500 mg by mouth See admin instructions. Take 1 tablet (500 mg) by mouth twice daily x 5 day at onset of outbreak.     No current facility-administered medications on file prior to visit.    Past Surgical History:  Procedure Laterality Date  ABDOMINAL HYSTERECTOMY     APPENDECTOMY     CARDIAC ELECTROPHYSIOLOGY MAPPING AND ABLATION  04/19/2003   CARPAL TUNNEL RELEASE Right 10/15/2017   CARPAL TUNNEL RELEASE Left 06/14/2022   CATARACT EXTRACTION Bilateral    07/2021 R 08/2021 L   COLONOSCOPY     CORONARY ANGIOPLASTY WITH STENT PLACEMENT     DE QUERVAIN'S RELEASE Left 06/14/2022   RIGHT/LEFT HEART CATH AND CORONARY ANGIOGRAPHY [name] 08/14/2017   Procedure: RIGHT/LEFT HEART CATH AND CORONARY ANGIOGRAPHY;  Surgeon: Cherrie Toribio SAUNDERS, MD;  Location: [facility] INVASIVE CV LAB;  Service: Cardiovascular;  Laterality: [name];   RIGHT/LEFT HEART CATH AND CORONARY ANGIOGRAPHY [name] 11/20/2021   Procedure: RIGHT/LEFT HEART CATH AND CORONARY ANGIOGRAPHY;  Surgeon: Cherrie Toribio SAUNDERS, MD;  Location: [facility] INVASIVE CV LAB;  Service: Cardiovascular;   Laterality: [name];   ULTRASOUND GUIDANCE FOR VASCULAR ACCESS  08/14/2017   Procedure: Ultrasound Guidance For Vascular Access;  Surgeon: Cherrie Toribio SAUNDERS, MD;  Location: [facility] INVASIVE CV LAB;  Service: Cardiovascular;;   UPPER GI ENDOSCOPY  07/15/2022    Allergies  Allergen Reactions   Avelox [Moxifloxacin Hcl In Nacl] Other (See Comments)    dizziness   Latex Rash    Mild sensitivity( Rash)per patient-    Other Other (See Comments)    SEASONAL ALLERGIES     BP (!) 135/57   Ht 5' 4 (1.626 m)   Wt 140 lb (63.5 kg)   BMI 24.03 kg/m      12/11/2020   11:36 AM 12/25/2020   11:26 AM  [facility] Adult [facility]  Frequency of aerobic exercise (# of days/week) 6 6  Average time in minutes 60 60  Frequency of strengthening activities (# of days/week) 5 5        No data to display              Objective:  Physical Exam:  Gen: NAD, comfortable in exam room  Left shoulder No swelling, ecchymoses.  No gross deformity. No TTP FROM without painful arc Negative Hawkins, Neers, and speeds. Strength 5/5 with empty can and resisted internal/external rotation.  No pain with empty can. NV intact distally.   Assessment and Plan:  Left shoulder pain Secondary to rotator cuff impingement.  Significantly improved with subacromial corticosteroid injection and home exercise program.  We recommend that she maintain her home exercise program.  OTC medications only if needed.  She can follow-[facility] at [facility] as needed.

## 2024-01-29 NOTE — Progress Notes (Signed)
 Chief Complaint: Fecal Incontinence  HPI:    Rhonda Brewer is a 73 year old Caucasian female with a past medical history as listed below including scleroderma on Hydroxychloroquine , CAD status post DES as well as CHF and GERD, known to Dr. Shila, who was referred to me by Madelon Donald HERO, DO for a complaint of fecal incontinence.    01/03/2017 EGD for epigastric abdominal pain and pain in the left upper quadrant with esophageal mucosal changes suspicious for short segment Barrett's, normal stomach, normal duodenum.  Pathology showed mild inflammation consistent with reflux.    09/09/2019 patient called cardiologist describing some chest discomfort when she woke up, she took a few times and the chest comfort was relieved.  Dr. Bensimhon advised her that it was common for patients with scleroderma to get reflux and this is likely what it was.    03/17/2023 colonoscopy for a positive Cologuard with a 16 mm polyp in the ascending colon, 4 mm polyp in the transverse colon, erythematous mucosa in the rectum and nonbleeding internal and external hemorrhoids.  Repeat recommended 3 years.  Pathology showed tubular adenoma and colonic mucosa without significant diagnostic alteration in the rectum.    04/22/23 ANA and ESR as well as CRP normal.    07/16/2023 echo with LVEF 55-60%.    12/31/2023 CBC and CMP normal.    12/31/2023 patient followed with rheumatology.  Not currently on immunosuppressive agents.  At that time discussed continued reflux regardless of Pantoprazole  twice a day and Pepcid .    Today, the patient presents to clinic and tells me over the past 6 months about once a week or once every couple of weeks she will have an episode of fecal incontinence.  Tells me its typically soft solid stool, not just diarrhea.  Describes that she does not even know that she has had a bowel movement until she goes to the bathroom for something else.  In between has fairly normal bowel movements.  She has had 2  children vaginally.  Also went through pelvic floor physical therapy about a year and a half ago for some bladder trouble.    Reflux is stable currently on Pantoprazole  40 mg twice daily and Pepcid  20 mg at night.    Denies fever, chills or weight loss.  Past Medical History:  Diagnosis Date   Allergy    Arthritis    CAD (coronary artery disease)    a. DES to RCA 11/2015   Cancer [facility])    Uterine 2008   Cataract    CHF (congestive heart failure) (HCC)    GERD (gastroesophageal reflux disease)    Heart murmur    Hyperlipidemia    Osteoporosis    Thyroid  disease     Past Surgical History:  Procedure Laterality Date   ABDOMINAL HYSTERECTOMY     APPENDECTOMY     CARDIAC ELECTROPHYSIOLOGY MAPPING AND ABLATION  04/19/2003   CARPAL TUNNEL RELEASE Right 10/15/2017   CARPAL TUNNEL RELEASE Left 06/14/2022   CATARACT EXTRACTION Bilateral    07/2021 R 08/2021 L   COLONOSCOPY     CORONARY ANGIOPLASTY WITH STENT PLACEMENT     DE QUERVAIN'S RELEASE Left 06/14/2022   RIGHT/LEFT HEART CATH AND CORONARY ANGIOGRAPHY [name] 08/14/2017   Procedure: RIGHT/LEFT HEART CATH AND CORONARY ANGIOGRAPHY;  Surgeon: Cherrie Toribio SAUNDERS, MD;  Location: [facility] INVASIVE CV LAB;  Service: Cardiovascular;  Laterality: [name];   RIGHT/LEFT HEART CATH AND CORONARY ANGIOGRAPHY [name] 11/20/2021   Procedure: RIGHT/LEFT HEART CATH AND CORONARY ANGIOGRAPHY;  Surgeon: Cherrie Toribio SAUNDERS, MD;  Location: [facility] INVASIVE CV LAB;  Service: Cardiovascular;  Laterality: [name];   ULTRASOUND GUIDANCE FOR VASCULAR ACCESS  08/14/2017   Procedure: Ultrasound Guidance For Vascular Access;  Surgeon: Cherrie Toribio SAUNDERS, MD;  Location: [facility] INVASIVE CV LAB;  Service: Cardiovascular;;   UPPER GI ENDOSCOPY  07/15/2022    Current Outpatient Medications  Medication Sig Dispense Refill   acetaminophen  (TYLENOL ) 500 MG tablet Take 500-1,000 mg by mouth every 6 (six) hours as needed (pain.).     albuterol  (VENTOLIN  HFA) 108 (90 Base) MCG/ACT inhaler Inhale 2  puffs into the lungs every 4 (four) hours as needed for wheezing or shortness of breath. 18 g 6   aspirin  EC 81 MG tablet Take 81 mg by mouth at bedtime.     atorvastatin  (LIPITOR) 40 MG tablet TAKE 1 TABLET BY MOUTH DAILY 100 tablet 2   Bacillus Coagulans-Inulin (PROBIOTIC) 1-250 BILLION-MG CAPS      Calcium  Carbonate-Vitamin D (CALCIUM -VITAMIN D3 PO) Take 1 tablet by mouth in the morning and at bedtime.     Cyanocobalamin (VITAMIN B-12 PO) Take 1 tablet by mouth in the morning.     famotidine  (PEPCID ) 20 MG tablet Take 20 mg by mouth in the morning.     fexofenadine (ALLEGRA) 180 MG tablet Take 180 mg by mouth as needed for allergies or rhinitis.     Glucos-Chond-MSM-Bor-D3-Hyalur (MOVE FREE JOINT HEALTH ADV + D PO)      levothyroxine  (SYNTHROID ) 75 MCG tablet TAKE 1 TABLET BY MOUTH DAILY  BEFORE BREAKFAST 100 tablet 2   losartan  (COZAAR ) 25 MG tablet TAKE 1 TABLET BY MOUTH DAILY 90 tablet 3   meclizine  (ANTIVERT ) 25 MG tablet Take 1 tablet (25 mg total) by mouth 3 (three) times daily as needed for dizziness. 20 tablet 0   MIEBO 1.338 GM/ML SOLN      Multiple Vitamin (MULTI-VITAMINS) TABS Take 1 tablet by mouth in the morning.     pantoprazole  (PROTONIX ) 40 MG tablet Take 1 tablet (40 mg total) by mouth 2 (two) times daily. 180 tablet 3   REPATHA  SURECLICK 140 MG/ML SOAJ INJECT 1 PEN SUBCUTANEOUSLY  EVERY 2 WEEKS 6 mL 3   triamcinolone  ointment (KENALOG ) 0.5 % Apply 1 Application topically 2 (two) times daily. 60 g 1   VAGINAL LUBRICANT VA Place vaginally.     valACYclovir (VALTREX) 500 MG tablet Take 500 mg by mouth See admin instructions. Take 1 tablet (500 mg) by mouth twice daily x 5 day at onset of outbreak.     No current facility-administered medications for this visit.    Allergies as of 02/02/2024 - Review Complete 01/28/2024  Allergen Reaction Noted   Avelox [moxifloxacin hcl in nacl] Other (See Comments) 06/19/2016   Latex Rash 02/18/2023   Other Other (See Comments)  07/12/2015    Family History  Problem Relation Age of Onset   Heart disease Mother    Diabetes Mother    Heart disease Father    Stomach cancer Brother    Diabetes Brother    Asthma Maternal Aunt    Colon cancer Neg Hx    Esophageal cancer Neg Hx    Rectal cancer Neg Hx    Colon polyps Neg Hx     Social History   Socioeconomic History   Marital status: Married    Spouse name: Isla   Number of children: 2   Years of education: 12   Highest education level: 12th grade  Occupational History  Occupation: retired    Comment: research scientist (medical)  Tobacco Use   Smoking status: Never    Passive exposure: Past   Smokeless tobacco: Never  Vaping Use   Vaping status: Never Used  Substance and Sexual Activity   Alcohol use: Yes    Comment: occasional wine   Drug use: No   Sexual activity: Yes    Birth control/protection: Surgical    Comment: hysterectomy  Other Topics Concern   Not on file  Social History Narrative   Lives in home with husband. Daughters live out of state. Step-daughter in [place]. Has 8 grandchildren. Lives in 2 level home but most living is on first floor. Stairs have handrails, no problem with stairs. No grab bars in bathroom. No tripping hazards. Smoke alarms present.   No pets.    Eats a good variety of foods, husband is a cook. Eats meats, fruits, vegetables. Drinks water mostly. Occasional tea.   Goes to gym 5 times a week. Enjoys reading, sewing, movies. Walking when weather is nice.    Wears seat belt in car.    Social Drivers of Corporate Investment Banker Strain: Low Risk  (06/02/2023)   Overall Financial Resource Strain (CARDIA)    Difficulty of Paying Living Expenses: Not hard at all  Food Insecurity: No Food Insecurity (06/02/2023)   Hunger Vital Sign    Worried About Running Out of Food in the Last Year: Never true    Ran Out of Food in the Last Year: Never true  Transportation Needs: No Transportation Needs (06/02/2023)   PRAPARE -  Administrator, Civil Service (Medical): No    Lack of Transportation (Non-Medical): No  Physical Activity: Sufficiently Active (06/02/2023)   Exercise Vital Sign    Days of Exercise per Week: 7 days    Minutes of Exercise per Session: 60 min  Stress: No Stress Concern Present (06/02/2023)   [employer] of Occupational Health - Occupational Stress Questionnaire    Feeling of Stress : Not at all  Social Connections: Unknown (06/02/2023)   Social Connection and Isolation Panel    Frequency of Communication with Friends and Family: More than three times a week    Frequency of Social Gatherings with Friends and Family: More than three times a week    Attends Religious Services: Patient declined    Database Administrator or Organizations: Yes    Attends Engineer, Structural: More than 4 times per year    Marital Status: Married  Catering Manager Violence: Not At Risk (06/02/2023)   Humiliation, Afraid, Rape, and Kick questionnaire    Fear of Current or Ex-Partner: No    Emotionally Abused: No    Physically Abused: No    Sexually Abused: No    Review of Systems:    Constitutional: No weight loss, fever or chills Cardiovascular: No chest pain Respiratory: No SOB  Gastrointestinal: See HPI and otherwise negative   Physical Exam:  Vital signs: BP 118/60   Pulse 62   Ht 5' 3 (1.6 m)   Wt 143 lb (64.9 kg)   BMI 25.33 kg/m    Constitutional:   Pleasant elderly Caucasian female appears to be in NAD, Well developed, Well nourished, alert and cooperative Respiratory: Respirations even and unlabored. Lungs clear to auscultation bilaterally.   No wheezes, crackles, or rhonchi.  Cardiovascular: Normal S1, S2. No MRG. Regular rate and rhythm. No peripheral edema, cyanosis or pallor.  Gastrointestinal:  Soft, nondistended, nontender. No  rebound or guarding. Normal bowel sounds. No appreciable masses or hepatomegaly. Rectal: External: No abnormality, slightly decreased  sphincter tone; internal: Slightly decreased sphincter tone with Valsalva; anoscopy: Grade 1 hemorrhoids in 2 columns Psychiatric: Demonstrates good judgement and reason without abnormal affect or behaviors.  RELEVANT LABS AND IMAGING: CBC    Component Value Date/Time   WBC 7.4 12/31/2023 1340   RBC 4.29 12/31/2023 1340   HGB 13.5 12/31/2023 1340   HCT 40.9 12/31/2023 1340   PLT 241 12/31/2023 1340   MCV 95.3 12/31/2023 1340   MCH 31.5 12/31/2023 1340   MCHC 33.0 12/31/2023 1340   RDW 12.5 12/31/2023 1340   LYMPHSABS 1.6 03/04/2023 0932   MONOABS 0.5 03/04/2023 0932   EOSABS 118 12/31/2023 1340   BASOSABS 22 12/31/2023 1340    CMP     Component Value Date/Time   NA 138 12/31/2023 1340   NA 138 02/19/2023 1041   K 4.3 12/31/2023 1340   CL 105 12/31/2023 1340   CO2 23 12/31/2023 1340   GLUCOSE 92 12/31/2023 1340   BUN 19 12/31/2023 1340   BUN 13 02/19/2023 1041   CREATININE 0.86 12/31/2023 1340   CALCIUM  9.7 12/31/2023 1340   PROT 7.2 12/31/2023 1340   PROT 6.7 02/19/2023 1041   ALBUMIN 4.2 07/16/2023 1146   ALBUMIN 4.2 02/19/2023 1041   AST 20 12/31/2023 1340   ALT 14 12/31/2023 1340   ALKPHOS 66 07/16/2023 1146   BILITOT 0.3 12/31/2023 1340   BILITOT 0.4 02/19/2023 1041   GFRNONAA >60 07/16/2023 1146   GFRNONAA 67 07/11/2020 1330   GFRAA 78 07/11/2020 1330    Assessment: 1.  Fecal incontinence: Colonoscopy in December 2024 with normal biopsies, internal and external hemorrhoids, on exam today grade 1 hemorrhoids and 2 columns which could be contributing as well as decreased pelvic floor tone after 2 vaginal births 2.  GERD: Controlled on Pantoprazole  40 mg twice daily and Pepcid  20 mg at night  Plan: 1.  Discussed fecal incontinence with the patient today.  Would recommend that she pick back up for pelvic floor exercises, if she needs referral back to pelvic floor physical therapy we can do that too. 2.  Prescribed suppositories.  Hydrocortisone suppositories  twice daily x 7 days, #14.  We will see if this helps with her hemorrhoids and sphincter tone. 3.  Recommend patient use a fiber supplement such as Metamucil, Citrucel or Benefiber to help bulk stools. 4.  Patient to follow in clinic with me in 3 to 4 months or sooner if necessary.  Delon Failing, PA-C [facility] 01/29/2024, 10:40 AM  Cc: Rumball, Alison M, DO

## 2024-02-02 ENCOUNTER — Encounter: Payer: Self-pay | Admitting: Physician Assistant

## 2024-02-02 ENCOUNTER — Ambulatory Visit (INDEPENDENT_AMBULATORY_CARE_PROVIDER_SITE_OTHER): Admitting: Physician Assistant

## 2024-02-02 VITALS — BP 118/60 | HR 62 | Ht 63.0 in | Wt 143.0 lb

## 2024-02-02 DIAGNOSIS — K6289 Other specified diseases of anus and rectum: Secondary | ICD-10-CM

## 2024-02-02 DIAGNOSIS — K219 Gastro-esophageal reflux disease without esophagitis: Secondary | ICD-10-CM | POA: Diagnosis not present

## 2024-02-02 DIAGNOSIS — R159 Full incontinence of feces: Secondary | ICD-10-CM | POA: Diagnosis not present

## 2024-02-02 DIAGNOSIS — M6289 Other specified disorders of muscle: Secondary | ICD-10-CM

## 2024-02-02 DIAGNOSIS — K640 First degree hemorrhoids: Secondary | ICD-10-CM

## 2024-02-02 NOTE — Patient Instructions (Signed)
 Start Benefiber or Citrucel 2 teaspoons in 8 ounces of liquid daily and may increase to twice daily if tolerated.   We have sent the following medications to your pharmacy for you to pick up at your convenience: Hydrocortisone suppository twice daily for 7 days, may repeat as needed.  Continue with Pelvic Floor Therapy exercise.   _______________________________________________________  If your blood pressure at your visit was 140/90 or greater, please contact your primary care physician to follow up on this.  _______________________________________________________  If you are age 73 or older, your body mass index should be between 23-30. Your Body mass index is 25.33 kg/m. If this is out of the aforementioned range listed, please consider follow up with your Primary Care Provider.  If you are age 73 or younger, your body mass index should be between 19-25. Your Body mass index is 25.33 kg/m. If this is out of the aformentioned range listed, please consider follow up with your Primary Care Provider.   ________________________________________________________  The [facility] providers would like to encourage you to use MYCHART to communicate with providers for non-urgent requests or questions.  Due to long hold times on the telephone, sending your provider a message by [facility] may be a faster and more efficient way to get a response.  Please allow 48 business hours for a response.  Please remember that this is for non-urgent requests.  _______________________________________________________  [facility] is using a team-based approach to care.  Your team is made up of your doctor and two to three APPS. Our APPS (Nurse Practitioners and Physician Assistants) work with your physician to ensure care continuity for you. They are fully qualified to address your health concerns and develop a treatment plan. They communicate directly with your gastroenterologist to care for you. Seeing the  Advanced Practice Practitioners on your physician's team can help you by facilitating care more promptly, often allowing for earlier appointments, access to diagnostic testing, procedures, and other specialty referrals.

## 2024-02-03 ENCOUNTER — Telehealth: Payer: Self-pay | Admitting: Physician Assistant

## 2024-02-03 MED ORDER — HYDROCORTISONE ACETATE 25 MG RE SUPP: 25.0000 mg | Freq: Two times a day (BID) | RECTAL | 2 refills | Status: DC

## 2024-02-03 NOTE — Telephone Encounter (Signed)
 PT is calling to inquire bout the Hydrocortisone suppository that was to be called in for her. It needs to be sent to [facility] on [address]. Please advise.

## 2024-02-03 NOTE — Telephone Encounter (Signed)
 Script sent to pharmacy.

## 2024-03-03 ENCOUNTER — Encounter: Payer: Self-pay | Admitting: Family Medicine

## 2024-03-03 ENCOUNTER — Ambulatory Visit (HOSPITAL_BASED_OUTPATIENT_CLINIC_OR_DEPARTMENT_OTHER): Admission: RE | Admit: 2024-03-03 | Discharge: 2024-03-03 | Attending: Internal Medicine | Admitting: Internal Medicine

## 2024-03-03 DIAGNOSIS — R0600 Dyspnea, unspecified: Secondary | ICD-10-CM | POA: Diagnosis present

## 2024-03-03 DIAGNOSIS — M349 Systemic sclerosis, unspecified: Secondary | ICD-10-CM | POA: Diagnosis present

## 2024-03-03 DIAGNOSIS — R0689 Other abnormalities of breathing: Secondary | ICD-10-CM | POA: Insufficient documentation

## 2024-03-03 DIAGNOSIS — R918 Other nonspecific abnormal finding of lung field: Secondary | ICD-10-CM | POA: Insufficient documentation

## 2024-03-09 ENCOUNTER — Ambulatory Visit: Admitting: Gastroenterology

## 2024-03-10 ENCOUNTER — Ambulatory Visit: Payer: Self-pay | Admitting: Internal Medicine

## 2024-03-10 NOTE — Progress Notes (Signed)
 CT has no acute process. Will discuss at followup Xxxxx  IMPRESSION: 1. No evidence of interstitial lung disease or interstitial lung abnormality. 2. Air trapping is indicative of small airways disease. 3. Mild cylindrical bronchiectasis. 4. Aortic atherosclerosis (ICD10-I70.0). Coronary artery calcification. 5. Enlarged pulmonic trunk, indicative of pulmonary arterial hypertension.     Electronically Signed   By: Newell Eke M.D.   On: 03/03/2024 14:30

## 2024-03-15 ENCOUNTER — Encounter: Payer: Self-pay | Admitting: Family Medicine

## 2024-03-16 MED ORDER — ACETAZOLAMIDE 125 MG PO TABS: 125.0000 mg | ORAL_TABLET | Freq: Two times a day (BID) | ORAL | 0 refills | Status: AC

## 2024-03-22 ENCOUNTER — Ambulatory Visit: Admitting: Adult Health

## 2024-04-05 ENCOUNTER — Encounter: Payer: Self-pay | Admitting: Internal Medicine

## 2024-04-05 ENCOUNTER — Ambulatory Visit: Admitting: Internal Medicine

## 2024-04-05 VITALS — BP 124/77 | HR 60 | Temp 98.1°F | Ht 64.0 in | Wt 147.0 lb

## 2024-04-05 DIAGNOSIS — M349 Systemic sclerosis, unspecified: Secondary | ICD-10-CM | POA: Diagnosis not present

## 2024-04-05 DIAGNOSIS — R053 Chronic cough: Secondary | ICD-10-CM

## 2024-04-05 DIAGNOSIS — R0600 Dyspnea, unspecified: Secondary | ICD-10-CM

## 2024-04-05 DIAGNOSIS — R0989 Other specified symptoms and signs involving the circulatory and respiratory systems: Secondary | ICD-10-CM

## 2024-04-05 NOTE — Progress Notes (Signed)
 "       PCP Rhonda Elsie LABOR, MD   HPI  IOV 08/16/2016  Chief Complaint  Patient presents with   Advice Only    Referred by Dr. dolphus for scleroderma.  c/o worsening sob, chest tightness with exertion X1 month.    HPI 74 year old woman with history of scleroderma on hydroxychloroquine , CAD s/p stent 11/2015, uterine cancer dx in 2008 s/p hysterectomy, chemo/radtx presenting for evaluation of dyspnea on exertion.  She is followed by Dr. Monna for rheumatology, Dr. Bensimhon for cardiology. Previously was followed by Dr. Thornell in [place]. Last saw Dr. Dolphus 06/19/2016. She has had scleroderma for 10 years and only has been on hydroxychloroquine . She moved to [facility] in January 2018.  For the past month, she has dyspnea with going up one flight of stairs most times. This is new. Also sometimes gets short of breath with bending over to pick something up. Denies cough. She does yoga, strength training, and walking for exercise. She can walk a couple of miles without any issues. She is independent in all her activities of daily living. Does not use any devices to aid in walking. Denies GERD. Has not had a pulmonologist.  She had shortness of breath that prompted the cardiac evaluation leading to stent placement. She has an appointment with cardiology June 12. Review of Systems  S: See resident physician for details. She has scleroderma for over 10 years for which she is on Actonel. Denies any associated acid reflux. Started on was believed only to involve the skin. I personally evaluated the history that this patient had a few months of shortness of breath in 2017 between summer and fall that then resulted in worsening dyspnea on exertion. That then resulted in a cardiac stent. After this dyspnea resolved. Then subsequently in January 2018 moved from the [place] area to [place], [place] . Now for the last 1 month she's having recurrent dyspnea on exertion. She feels this is  from the heart. She does not think is a lung issue. Relieved by rest. She notices it for climbing stairs relieved by rest. She has cardiology appointment pending. There is a pulmonary function test and echocardiogram pending on 08/27/2016. She is reluctant to get a CT chest. Walking desat test in office 185 feet x  3 laps on RA: 100% at rest and exertion  Recent pertinent labs  Results for Brewer, Rhonda (MRN [id number]) as of 08/16/2016 10:29  Ref. Range 06/19/2016 10:38 06/26/2016 10:04  Creatinine Latest Ref Range: 0.50 - 0.99 mg/dL 9.20   Results for Brewer, Rhonda (MRN [id number]) as of 08/16/2016 10:29  Ref. Range 06/19/2016 10:38 06/26/2016 10:04  Hemoglobin Latest Ref Range: 11.7 - 15.5 g/dL 85.1      has a past medical history of Cancer (HCC).   reports that she has never smoked. She has never used smokeless tobacco.   OV 11/05/2016  Chief Complaint  Patient presents with   Follow-up    Pt here after CT and PFT. Pt denies change in SOB since last OV. Pt denies cough, CP/tightness, f/c/s.     Follow-up scleroderma with associated shortness of breath   last seen in June 2018. At that time he thought the suspicion for interstitial lung disease was low. She had Pulm  function test that showed isolated reduction in diffusion capacity to 63%. This raises the possibility of interstitial lung disease but she did have a high-resolution CT scan of the chest that is documented below. Interstitial lung disease has  been ruled out. This no pulmonary parenchymal abnormality. Her hemoglobin was 14.8 g percent suggesting no anemia causing shortness of breath. She then followed up with cardiology. According to the echocardiogram she does not have pulmonary hypertension which can be seen and started on the patient's. She was on  BRILINTA  for CAD - this got changed to Plavix  and her dyspnea resolved. Currently she is doing well does not have any rest symptoms.   Results for Brewer, Rhonda (MRN [id number]) as of  11/05/2016 11:24  Ref. Range 08/27/2016 11:19  FVC-Pre Latest Units: L 2.86  FVC-%Pred-Pre Latest Units: % 91  FEV1-Pre Latest Units: L 2.11  FEV1-%Pred-Pre Latest Units: % 88  Pre FEV1/FVC ratio Latest Units: % 74    Results for Brewer, Rhonda (MRN [id number]) as of 11/05/2016 11:24  Ref. Range 08/27/2016 11:19  TLC Latest Units: L 4.64  TLC % pred Latest Units: % 91  Results for Brewer, Rhonda (MRN [id number]) as of 11/05/2016 11:24  Ref. Range 08/27/2016 11:19  DLCO unc Latest Units: ml/min/mmHg 15.42  DLCO unc % pred Latest Units: % 63   IMPRESSION: 1. No evidence of interstitial lung disease. No acute pulmonary disease . 2. Solitary 3 mm solid apical right upper lobe pulmonary nodule. No follow-up needed if patient is low-risk. Non-contrast chest CT can be considered in 12 months if patient is high-risk. This recommendation follows the consensus statement: Guidelines for Management of Incidental Pulmonary Nodules Detected on CT Images: From the Fleischner Society 2017; Radiology 2017; 284:228-243. 3. Small pericardial effusion/thickening. 4. Three-vessel coronary atherosclerosis.   Aortic Atherosclerosis (ICD10-I70.0).     Electronically Signed   By: Selinda Rhonda Blue M.D.   On: 09/03/2016 15:18  OV 12/16/2017  Subjective:  Patient ID: Rhonda Brewer, female , DOB: 1950-06-30 , age 74 y.o. , MRN: [id number] , ADDRESS: [address] Rhonda Brewer [facility] [address]   12/16/2017 -   Chief Complaint  Patient presents with   Follow-up    Pt is here for a 1 year follow up and states she has been doing well. Pt denies any complaints.     HPI Rhonda Brewer 74 y.o. -1 year follow-up for ILD monitoring in the setting of scleroderma.  She says overall she is stable.  She is no longer on Plaquenil  for scleroderma.  She continues to have Raynaud.  She tells me other than extremely mild shortness of breath IV exertion she is stable.  There is no interim medical issues or surgical issues.  No change in  medications of significance.  No ER visits no hospitalizations.  Only interim medical issues carpal tunnel surgery.  She had a CT scan of the chest July 2019.  This is not a high-resolution CT chest.  On the report there is no evidence of ILD.  Last echocardiogram June 2018 without any pulmonary hypertension.  There is no wheezing cough orthopnea proximal nocturnal dyspnea.    OV 08/10/2020  Subjective:  Patient ID: Rhonda Brewer, female , DOB: [birth year] , age 76 y.o. , MRN: [id number] , ADDRESS: [address] Rhonda Brewer [facility] [address] PCP Rhonda Elsie DELENA, MD Patient Care Team: Rhonda Elsie DELENA, MD as PCP - General (Family Medicine) Rhonda Brewer, Toribio SAUNDERS, MD as PCP - Advanced Heart Failure (Cardiology) Rhonda Brewer Reiter, MD as Consulting Physician (Rheumatology) Rhonda Brewer, Toribio SAUNDERS, MD as Consulting Physician (Cardiology) Shila Gustav GAILS, MD as Consulting Physician (Gastroenterology) Geronimo Amel, MD as Consulting Physician (Pulmonary Disease) Octavia Bruckner, MD as Consulting Physician (Ophthalmology) Marne Kelly Nest, MD as Consulting Physician (  Obstetrics and Gynecology)  This Provider for this visit: Treatment Team:  Attending Provider: Geronimo Amel, MD    08/10/2020 -   Chief Complaint  Patient presents with   Follow-up    Pt states she has been doing okay since last visit. Denies any issues with her breathing.   Follow-up scleroderma with very mild shortness of breath.  At risk for ILD.  - 2019 CT without ILD -2020 pulmonary function test with improvement/normal  HPI Jeanny Rymer 74 y.o. -returns for follow-up.  I personally saw her in 2019.  Then after that because of the pandemic I did not see her.  She did see nurse practitioner December 2020.  Pulmonary function test was normal//slightly improved/stable.  She is on expectant follow-up with monitoring.  In April 2022 she is a Dr. D her rheumatologist.  She asked to be established back in pulmonary to  see me.  Patient has continued ongoing issues with sclerodactyly and also Raynaud's.  In January 2022 she had echocardiogram that I visualized the result.  It shows valvular regurgitation is worse but no evidence of pulmonary hypertension.  She has very mild shortness of breath if at all for climbing stairs.  Symptom scores are not detailed below.  Walking desaturation test is stable compared to 3 years ago and normal.   PFT Subjective:  Patient ID: Rhonda Brewer, female , DOB: 02/23/1951 , age 72 y.o. , MRN: [id number] , ADDRESS: [address] Rhonda Brewer [facility] [address] PCP Rhonda Elsie LABOR, MD Patient Care Team: Rhonda Elsie LABOR, MD as PCP - General (Family Medicine) Rhonda Brewer, Toribio SAUNDERS, MD as PCP - Advanced Heart Failure (Cardiology) Rhonda Brewer Reiter, MD as Consulting Physician (Rheumatology) Rhonda Brewer, Toribio SAUNDERS, MD as Consulting Physician (Cardiology) Shila Gustav GAILS, MD as Consulting Physician (Gastroenterology) Geronimo Amel, MD as Consulting Physician (Pulmonary Disease) Octavia Bruckner, MD as Consulting Physician (Ophthalmology) Marne Kelly Nest, MD as Consulting Physician (Obstetrics and Gynecology)  This Provider for this visit: Treatment Team:  Attending Provider: Geronimo Amel, MD    03/01/2021 -   Chief Complaint  Patient presents with   Follow-up    PFT performed today.  Pt states she has been doing okay since last visit and denies any complaints.   Follow-up scleroderma with very mild shortness of breath.  At risk for ILD.  - 2019 CT without ILD -2020 pulmonary function test with improvement/normal  HPI Rhonda Brewer 74 y.o. -returns for follow-up.  In the last 6 months she reports no change in her shortness of breath.  She admits to shortness of breath for stairs relieved by rest.  Last visit 2 she had the same thing but she scored 0 but she actually tells me she has shortness of breath with stairs relieved by rest.  There is no associated chest  pain.  She says this is stable without any worsening.  No cough.  She had pulmonary function test today it shows a slight drop in DLCO below 80%.  The first time there is a drop this low.  I did share these results with her.  Last CT scan of the chest was in 2019.  She is willing to have another CT scan of the chest  She is on pulmonary hypertension monitoring through Dr. Bensimhon.  Next echocardiogram was in March 2023 according to history.    CT Chest data  No results found.  OV 09/07/2021  Subjective:  Patient ID: Rhonda Brewer, female , DOB: [birth year] , age 17 y.o. , MRN: [id number] ,  ADDRESS: [address] Rhonda Brewer [facility] [address] PCP Rhonda Elsie LABOR, MD Patient Care Team: Rhonda Elsie LABOR, MD as PCP - General (Family Medicine) Rhonda Brewer, Toribio SAUNDERS, MD as PCP - Advanced Heart Failure (Cardiology) Rhonda Brewer Reiter, MD as Consulting Physician (Rheumatology) Rhonda Brewer, Toribio SAUNDERS, MD as Consulting Physician (Cardiology) Shila Gustav GAILS, MD as Consulting Physician (Gastroenterology) Geronimo Amel, MD as Consulting Physician (Pulmonary Disease) Octavia Bruckner, MD as Consulting Physician (Ophthalmology) Marne Kelly Nest, MD as Consulting Physician (Obstetrics and Gynecology)  This Provider for this visit: Treatment Team:  Attending Provider: Geronimo Amel, MD    09/07/2021 -   Chief Complaint  Patient presents with   Follow-up    PFT performed today.  Pt states she has been doing okay since last visit and denies any complaints.   Follow-up scleroderma at risk for ILD.  Normal pulmonary function test.  HPI Rhonda Brewer 74 y.o. -returns for follow-up.  Last visit there was concern that the pulmonary function test was changing.  So we repeated it.  Brought in 37-month interval.  The test is actually normal.  High-resolution CT chest shows that there is no ILD.  She just has planned scarring.  She continues to feel well.  Very minimal to no symptoms.   Echocardiogram is being monitored by cardiology.  She has no new complaints.  No changes to health status.  She is content with the level of monitoring.  We briefly discussed possible enrollment in clinical trials: She might be interested We also discussed joining support group: We gave her the number for Arland the support group leader.  Of note CT scan shows possible cirrhosis of the liver.  Personally visualized the images of the pulmonary function test and CT scan.     OV 01/21/2023  Subjective:  Patient ID: Rhonda Brewer, female , DOB: [birth year] , age 73 y.o. , MRN: [id number] , ADDRESS: [address] Rhonda Brewer [facility] [address] PCP Madelon Donald HERO, DO Patient Care Team: Madelon Donald HERO, DO as PCP - General (Family Medicine) Rhonda Brewer, Toribio SAUNDERS, MD as PCP - Advanced Heart Failure (Cardiology) Rhonda Brewer Reiter, MD as Consulting Physician (Rheumatology) Rhonda Brewer, Toribio SAUNDERS, MD as Consulting Physician (Cardiology) Shila Gustav GAILS, MD as Consulting Physician (Gastroenterology) Geronimo Amel, MD as Consulting Physician (Pulmonary Disease) Octavia Bruckner, MD as Consulting Physician (Ophthalmology) Marne Kelly Nest, MD as Consulting Physician (Obstetrics and Gynecology)  This Provider for this visit: Treatment Team:  Attending Provider: Geronimo Amel, MD    01/21/2023 -   Chief Complaint  Patient presents with   Follow-up    Pft f/u   Scleroderma without ILD but mild bland scarring on surveillance  HPI Rhonda Brewer 74 y.o. -returns for follow-up.  Last seen in June 2023.  She tells me that since then she has had insidious onset of shortness of breath.  Present walking stairs.  It was always mild but now it is more noticeable.  She is also having mild chronic dry cough at night there is no wheezing.  This cough is also more noticeable.  Definitely symptoms are progressive in the last 6 months.  Symptom score below clearly shows that the symptoms were absent  in 2022 and now present and more prominent.  She had an echocardiogram in August 2024 and it is normal.  She had pulmonary function test today that I reviewed and it shows a decline FVC is now 2.5 L / 86% and DLCO is now 13.98/72%.  There is a change.  I visualized the previous CT  scan and agreed with the previous findings.  I did indicate to her that I am concerned about potential ILD developing.  She also has seasonal allergies we will check a CBC with differential blood IgE She indicated to me she has family Struve diabetes and wants her hemoglobin A1c checked.   Nw   OV 02/20/2023  Subjective:  Patient ID: Rhonda Brewer, female , DOB: 11/15/1950 , age 87 y.o. , MRN: [id number] , ADDRESS: [address] [place] [address] PCP Madelon Donald HERO, DO Patient Care Team: Madelon Donald HERO, DO as PCP - General (Family Medicine) Rhonda Brewer, Toribio SAUNDERS, MD as PCP - Advanced Heart Failure (Cardiology) Rhonda Brewer Reiter, MD as Consulting Physician (Rheumatology) Rhonda Brewer, Toribio SAUNDERS, MD as Consulting Physician (Cardiology) Shila Gustav GAILS, MD as Consulting Physician (Gastroenterology) Geronimo Amel, MD as Consulting Physician (Pulmonary Disease) Octavia Bruckner, MD as Consulting Physician (Ophthalmology) Marne Kelly Nest, MD as Consulting Physician (Obstetrics and Gynecology)  This Provider for this visit: Treatment Team:  Attending Provider: Geronimo Amel, MD  Type of visit: Video Virtual Visit Identification of patient Rhonda Brewer with Feb 08, 1951 and MRN [id number] - 2 person identifier Risks: Risks, benefits, limitations of telephone visit explained. Patient understood and verbalized agreement to proceed Anyone else on call: jus patient Patient location: her home This provider location: [address]; [place]; [place] [address]. [facility]. [phone]     02/20/2023 -  scleroderma patinet    HPI Rhonda Brewer 74 y.o.  -presents for this video visit to discuss test results.  Last visit perhaps she was a little more short of breath but perhaps not but pulmonary function test suggestive of decline.  Therefore we did a blood BNP this is slightly high but otherwise normal.  Hemoglobin A1c was normal.  Anemia test was normal.  Recent echo this year was also normal.  She open overall she is continuing to do stable and no interim complaints.  CT scan visualized personally and there is no evidence of ILD.  I showed this to her and also the June 2023 high-resolution CT chest.  Unfortunately the official report is still pending.  Nevertheless overall because of his symptom stability and no interim issues and in my personal visualization no development of ILD without this was reassuring.  She is going to monitor her symptoms and come back for follow-up in approximately 9 months with a breathing test.     CT Chest data from date: 111/27/24  - personally visualized and independently interpreted : yes - my findings are: no ILD. -official report pending    OV 08/28/2023  Subjective:  Patient ID: Rhonda Brewer, female , DOB: 04-12-1950 , age 59 y.o. , MRN: [id number] , ADDRESS: [address] Rhonda Brewer [facility] [address] PCP Madelon Donald HERO, DO Patient Care Team: Madelon Donald HERO, DO as PCP - General (Family Medicine) Rhonda Brewer, Toribio SAUNDERS, MD as PCP - Advanced Heart Failure (Cardiology) Rhonda Brewer Reiter, MD as Consulting Physician (Rheumatology) Rhonda Brewer, Toribio SAUNDERS, MD as Consulting Physician (Cardiology) Shila Gustav GAILS, MD as Consulting Physician (Gastroenterology) Geronimo Amel, MD as Consulting Physician (Pulmonary Disease) Octavia Bruckner, MD as Consulting Physician (Ophthalmology) Marne Kelly Nest, MD as Consulting Physician (Obstetrics and Gynecology)  This Provider for this visit: Treatment Team:  Attending Provider: Geronimo Amel, MD    08/28/2023 -   Chief Complaint  Patient presents  with   Follow-up  Scleroderma patient   HPI Rhonda Brewer 74 y.o. -scleroderma patient here for routine follow-up.  I last  saw her in November 2020 for and at that time high-resolution CT chest was clear without any ILD.  However March 07, 2023 she ended up in the ED because of atypical chest pain and also vertigo and dizziness and cough.  She ended up getting a CT angiogram chest that ruled out pulmonary embolism but showed significant groundglass opacities.  She was discharged with Z-Pak.  She subsequently did see Dr. Toribio Fuel 04/22/2023.  At that time she was still having cough that was getting worse when lying down.  She also reported increasing shortness of breath.  She was reporting increased acid reflux no recurrent chest pain no edema.  Dr. Theda more than doubled up her pantoprazole .  At this point in time she says that her cough is back to baseline significantly improved.  Shortness of breath is stable.  She does have nonspecific fatigue by end of the day.  She saw Dr. Fuel April 2025 and told him that she is going to the gym regularly without any chest pain or undue shortness of breath.  She is also followed up 07/31/2023 with rheumatology.  At this point in time she is stable.  She did a sit/stand exercise hypoxemia test.  It was essentially normal.   Given multiple CT scans last had opted for a more conservative line.  Will see her in more close follow-up.  Will get a PFT in September 2025 and if it is stable we will get a high-resolution CT chest in December 2025     New isssue is GGO  CT Chest data from date: dec 2024*  - personally visualized and independently interpreted : yes - my findings are:  GGO but somewhat false poisitve due to CTA Narrative & Impression  CLINICAL DATA:  SOB, weakness, tightness of the chest.   EXAM: CT ANGIOGRAPHY CHEST WITH CONTRAST   TECHNIQUE: Multidetector CT imaging of the chest was performed using the standard protocol during  bolus administration of intravenous contrast. Multiplanar CT image reconstructions and MIPs were obtained to evaluate the vascular anatomy.   RADIATION DOSE REDUCTION: This exam was performed according to the departmental dose-optimization program which includes automated exposure control, adjustment of the mA and/or kV according to patient size and/or use of iterative reconstruction technique.   CONTRAST:  75mL OMNIPAQUE  IOHEXOL  350 MG/ML SOLN   COMPARISON:  02/12/2023.   FINDINGS: Cardiovascular: No pulmonary artery filling defects to indicate PE. Cardiomegaly. No aortic aneurysm. Atheromatous calcifications aorta and coronary arteries. Reflux contrast into the IVC consistent with tricuspid insufficiency.   Mediastinum/Nodes: No suspicious mediastinal adenopathy. Diminutive thyroid . Unremarkable tracheobronchial tree and esophagus.   Lungs/Pleura: Patchy ground-glass opacities consistent with pneumonitis or edema. No focal consolidation. No pneumothorax or pleural effusion.   Upper Abdomen: No acute abnormality.   Musculoskeletal: Thoracic degenerative changes.   Review of the MIP images confirms the above findings.   IMPRESSION: 1. No evidence of PE. 2. Cardiomegaly. 3. Patchy ground-glass opacities consistent with pneumonitis or edema.     Electronically Signed   By: Fonda Field M.D.   On: 03/15/2023 19:13      OV 01/05/2024  Subjective:  Patient ID: Rhonda Brewer, female , DOB: [birth year] , age 89 y.o. , MRN: [id number] , ADDRESS: [address] [place] [address] PCP Madelon Donald HERO, DO Patient Care Team: Madelon Donald HERO, DO as PCP - General (Family Medicine) Rhonda Brewer, Toribio SAUNDERS, MD as PCP - Advanced Heart Failure (Cardiology) Rhonda Brewer, Toribio SAUNDERS, MD as Consulting Physician (Cardiology) Nandigam, Kavitha  LULLA, MD as Consulting Physician (Gastroenterology) Geronimo Amel, MD as Consulting Physician (Pulmonary Disease) Octavia Bruckner, MD  as Consulting Physician (Ophthalmology) Marne Kelly Nest, MD as Consulting Physician (Obstetrics and Gynecology)  This Provider for this visit: Treatment Team:  Attending Provider: Geronimo Amel, MD    01/05/2024 -   Chief Complaint  Patient presents with   scleroderma    Pt stated pt stated breathing has been good, no issues       HPI Rhonda Brewer 74 y.o. -presents for follow-up.  Last seen in June 2025.  She has scleroderma and was on monitoring but end of last year she developed pulmonary infiltrates that was presumably because of acid reflux.  She is supposed to get pulmonary function test as part of monitoring today but our office did not schedule this.  In any event she has had a flu shot. .Interim Health status: No new complaints No new medical problems. No new surgeries. No ER visits. No Urgent care visits. No changes to medications.  She denies any shortness of breath or cough.  She does activities of daily living.  Other issues based on external medical review: - 12/31/2022 Cheryl Birmingham rheumatology PA-currently not taking immunosuppressive agents.  Monitor with routine lab work - 01/02/2024:  Rhonda Brewer heart failure specialist: Echocardiogram 01/02/2024 normal ejection fraction.  Plan to come back in 1 year.     OV 04/05/2024  Subjective:  Patient ID: Rhonda Brewer, female , DOB: October 03, 1950 , age 44 y.o. , MRN: [id number] , ADDRESS: [address] Rhonda Brewer [facility] [address] PCP Madelon Donald HERO, DO Patient Care Team: Madelon Donald HERO, DO as PCP - General (Family Medicine) Rhonda Brewer, Toribio SAUNDERS, MD as PCP - Advanced Heart Failure (Cardiology) Rhonda Brewer, Toribio SAUNDERS, MD as Consulting Physician (Cardiology) Shila Gustav LULLA, MD as Consulting Physician (Gastroenterology) Geronimo Amel, MD as Consulting Physician (Pulmonary Disease) Octavia Bruckner, MD as Consulting Physician (Ophthalmology) Marne Kelly Nest, MD as Consulting Physician (Obstetrics and  Gynecology)  This Provider for this visit: Treatment Team:  Attending Provider: Geronimo Amel, MD    04/05/2024 -   Chief Complaint  Patient presents with   Cough    Pt states since LOV breathing has been good      HPI Rhonda Brewer 74 y.o. -Rhonda Brewer is a 74 year old female who presents for follow-up of scleroderma who as of November 2024 had normal high-resolution CT chest but in December 2024 had concern for aspiration episode and pulmonary lower lobe inflammation with air trapping.  She had a follow-up CT chest 03/03/2024 that a person visualized it looks improved from a year ago but she still does have air trapping which was nonexistent in my view  Nov 2024.  On further questioning she did admit to having down comforter but no mold exposure in the house.  Towards the end of December 2025, she experienced symptoms including cough, lethargy, and tiredness, without fever. These symptoms affected her chest and sinuses and persisted for about two weeks. She did not seek medical attention during this period, suspecting a viral illness. She has since recovered and feels she is back to her baseline health.     She does not have asthma, and there is no mold in her house. She uses a down comforter at home.  She is originally from [place], [place].     SYMPTOM SCALE  08/10/2020  03/01/2021  01/21/2023  08/28/2023   O2 use ra ra ra ra  Shortness of Breath 0 -> 5  scale with 5 being worst (score 6 If unable to do)     At rest 0 0 0 0  Simple tasks - showers, clothes change, eating, shaving 0 0 0 0  Household (dishes, doing bed, laundry) 000 0 1 1  Shopping 0 0 0 0  Walking level at own pace 0 0 0 0  Walking up Stairs 0 1 2 2   Total (30-36) Dyspnea Score 00 1 3 3   How bad is your cough? 0 0 2 1  How bad is your fatigue 0 0 0 2  How bad is nausea 00 0 0 0  How bad is vomiting?  0 0 0 00  How bad is diarrhea? 0 0 0 0  How bad is anxiety? 0 0 0 0  How bad is  depression 0 0 0 0    Simple office walk 185 feet x  3 laps goal with forehead probe 12/16/2017  08/10/2020  01/21/2023  08/28/2023   O2 used Room air ra ra Room air forehead probe  Number laps completed 3 3 Sit stand x 15 Sit stand x 15  Comments about pace Normal brisk avg space  40 seconds  Resting Pulse Ox/HR 99% and 65/min 98% and HR 67 96% and HR 70 98% and heart rate 64  Final Pulse Ox/HR 99% and 74/min 98% and HR 82 95% and HR 77 90% and heart rate 86  Desaturated </= 88% no no  no  Desaturated <= 3% points no no  no  Got Tachycardic >/= 90/min no no  no  Symptoms at end of test non3 No complaints  No complaints no dyspnea at all.  Miscellaneous comments Normal test           SIT STAND TEST - goal 15 times   01/05/2024    O2 used ra   PRobe - finter or forehead forehead   Number sit and stand completed - goal 15 15   Time taken to complete 35 sec   Resting Pulse Ox/HR/Dyspnea  100% and 65/min and dyspnea of 0/10    Peak measures 100 % and 65/min and dyspnea of 0/10   Final Pulse Ox/HR 100% and 67/min and dyspnea of 0/10   Desaturated </= 88% no   Desaturated <= 3% points no   Got Tachycardic >/= 90/min yes   Miscellaneous comments no      CT Chest data from date: dec 2025  - personally visualized and independently interpreted : YES  - my findings are: AIR TRAPPING Go to the ER  IMPRESSION: 1. No evidence of interstitial lung disease or interstitial lung abnormality. 2. Air trapping is indicative of small airways disease. 3. Mild cylindrical bronchiectasis. 4. Aortic atherosclerosis (ICD10-I70.0). Coronary artery calcification. 5. Enlarged pulmonic trunk, indicative of pulmonary arterial hypertension.     Electronically Signed   By: Newell Eke M.D.   On: 03/03/2024 14:30  PFT     Latest Ref Rng & Units 01/21/2023   10:55 AM 09/07/2021   11:39 AM 03/01/2021    2:50 PM 03/03/2019    2:00 PM 08/27/2016   11:19 AM  PFT Results  FVC-Pre L 2.54   2.75  2.63  2.90  2.86   FVC-Predicted Pre % 86  91  87  94  91   FVC-Post L  2.69    2.98   FVC-Predicted Post %  89    94   Pre FEV1/FVC % % 74  79  78  80  74   Post FEV1/FCV % %  83    82   FEV1-Pre L 1.88  2.17  2.05  2.32  2.11   FEV1-Predicted Pre % 85  95  90  99  88   FEV1-Post L  2.23    2.45   DLCO uncorrected ml/min/mmHg 13.98  16.20  14.84  16.78  15.42   DLCO UNC% % 72  83  76  85  63   DLCO corrected ml/min/mmHg 13.98  16.10  14.84     DLCO COR %Predicted % 72  82  76     DLVA Predicted % 77  85  85  93  70   TLC L  4.91    4.64   TLC % Predicted %  97    91   RV % Predicted %  93    80        LAB RESULTS last 96 hours No results found.       has a past medical history of Allergy, Arthritis, CAD (coronary artery disease), Cancer [facility]) (April 2007), Cataract (2021), CHF (congestive heart failure) (HCC), GERD (gastroesophageal reflux disease), Heart murmur, Hyperlipidemia, Hypertension, Osteoporosis, and Thyroid  disease.   reports that she has never smoked. She has been exposed to tobacco smoke. She has never used smokeless tobacco.  Past Surgical History:  Procedure Laterality Date   ABDOMINAL HYSTERECTOMY  July 08, 2005   APPENDECTOMY  July 2006   CARDIAC ELECTROPHYSIOLOGY MAPPING AND ABLATION  04/19/2003   CARPAL TUNNEL RELEASE Right 10/15/2017   CARPAL TUNNEL RELEASE Left 06/14/2022   CATARACT EXTRACTION Bilateral    07/2021 R 08/2021 L   COLONOSCOPY     CORONARY ANGIOPLASTY WITH STENT PLACEMENT     DE QUERVAIN'S RELEASE Left 06/14/2022   EYE SURGERY  May 2023 & July 2023   Cataract Surgery   RIGHT/LEFT HEART CATH AND CORONARY ANGIOGRAPHY [name] 08/14/2017   Procedure: RIGHT/LEFT HEART CATH AND CORONARY ANGIOGRAPHY;  Surgeon: Cherrie Toribio SAUNDERS, MD;  Location: [facility] INVASIVE CV LAB;  Service: Cardiovascular;  Laterality: [name];   RIGHT/LEFT HEART CATH AND CORONARY ANGIOGRAPHY [name] 11/20/2021   Procedure: RIGHT/LEFT HEART CATH AND CORONARY ANGIOGRAPHY;   Surgeon: Cherrie Toribio SAUNDERS, MD;  Location: [facility] INVASIVE CV LAB;  Service: Cardiovascular;  Laterality: [name];   ULTRASOUND GUIDANCE FOR VASCULAR ACCESS  08/14/2017   Procedure: Ultrasound Guidance For Vascular Access;  Surgeon: Cherrie Toribio SAUNDERS, MD;  Location: [facility] INVAS[facility] CV LAB;  Service: Cardiovascular;;   UPPER GI ENDOSCOPY  07/15/2022    Allergies[1]  Immunization History  Administered Date(s) Administered   Fluad Quad(high Dose 65+) 11/29/2019, 12/07/2020, 12/31/2021   INFLUENZA, HIGH DOSE SEASONAL PF 12/08/2023   Influenza Split 01/17/2016   Influenza, Seasonal, Injecte, Preservative Fre 02/20/2012   Influenza,inj,Quad PF,6+ Mos 11/12/2018   Influenza-Unspecified 04/02/2017, 12/08/2017, 12/05/2022   Moderna Covid-19 Vaccine Bivalent Booster 22yrs & up 01/31/2021   Moderna SARS-COV2 Booster Vaccination 01/13/2020, 09/25/2020   Moderna Sars-Covid-2 Vaccination 04/09/2019, 05/14/2019   PFIZER Comirnaty(Gray Top)Covid-19 Tri-Sucrose Vaccine 01/21/2024   Pneumococcal Conjugate-13 10/26/2014   Pneumococcal Polysaccharide-23 11/12/2018   Pneumococcal-Unspecified 09/14/2013   Td 07/18/2011   Unspecified SARS-COV-2 Vaccination 02/19/2022, 12/05/2022    Family History  Problem Relation Age of Onset   Heart disease Mother    Diabetes Mother    Varicose Veins Mother    Heart disease Father    Diabetes Father    Stomach cancer Brother    Cancer Brother  Diabetes Brother    Asthma Maternal Aunt    Cancer Maternal Grandmother    Varicose Veins Maternal Grandmother    Cancer Maternal Uncle    Colon cancer Neg Hx    Esophageal cancer Neg Hx    Rectal cancer Neg Hx    Colon polyps Neg Hx     Current Medications[2]      Objective:   Vitals:   04/05/24 1429  BP: 124/77  Pulse: 60  Temp: 98.1 F (36.7 C)  TempSrc: Oral  SpO2: 95%  Weight: 147 lb (66.7 kg)  Height: 5' 4 (1.626 m)    Estimated body mass index is 25.23 kg/m as calculated from the following:    Height as of this encounter: 5' 4 (1.626 m).   Weight as of this encounter: 147 lb (66.7 kg).  @WEIGHTCHANGE @  Filed Weights   04/05/24 1429  Weight: 147 lb (66.7 kg)     Physical Exam   General: No distress. Looks well O2 at rest: no Cane present: no Sitting in wheel chair: no Frail: no Obese: no Neuro: Alert and Oriented x 3. GCS 15. Speech normal Psych: Pleasant Resp:  Barrel Chest - no.  Wheeze - no, Crackles - no, No overt respiratory distress CVS: Normal heart sounds. Murmurs - no Ext: Stigmata of Connective Tissue Disease - no HEENT: Normal upper airway. PEERL +. No post nasal drip        Assessment/     Assessment & Plan Dyspnea and respiratory abnormalities  Scleroderma (HCC)  Pulmonary air trapping    PLAN Patient Instructions     ICD-10-CM   1. Scleroderma (HCC)  M34.9     2. Dyspnea and respiratory abnormalities  R06.00    R06.89        November 2024 CT chest without any interstitial lung disease and clear but in December 2024 at ER visit CT angiogram rule out pulmonary embolism but did have groundglass opacities.  You seem to have recovered from that after increasing her acid reflux medicine.  And in December 2025 CT scan of the chest is improved but you do have air trapping which she also had in December 2024 but you did not have in November 2024  However you remain asymptomatic  Noted that you have down comforter exposure   Plan - Get rid of down comforter -Do spirometry and DLCO in 9 months -Continued monitoring approach recommended   followup - 9 spirometry and DLCO and return to see Dr. Geronimo in a 15-minute visit    FOLLOWUP    Return for - 9 spirometry and DLCO and return to see Dr. Geronimo in a 15-minute visit.    SIGNATURE    Dr. Dorethia Geronimo, M.D., F.C.C.P,  Pulmonary and Critical Care Medicine Staff Physician, [facility] Center Director - Interstitial Lung Disease  Program  Pulmonary Fibrosis  [facility] [place], [place], [address]  Pager: [phone], If no answer or between  15:00h - 7:00h: call [phone] Telephone: [phone]  3:03 PM 04/05/2024 3    [1]  Allergies Allergen Reactions   Avelox [Moxifloxacin Hcl In Nacl] Other (See Comments)    dizziness   Latex Rash    Mild sensitivity( Rash)per patient-    Other Other (See Comments)    SEASONAL ALLERGIES   [2]  Current Outpatient Medications:    acetaminophen  (TYLENOL ) 500 MG tablet, Take 500-1,000 mg by mouth every 6 (six)  hours as needed (pain.)., Disp: , Rfl:    acetaZOLAMIDE  (DIAMOX ) 125 MG tablet, Take 1 tablet (125 mg total) by mouth 2 (two) times daily. (Patient taking differently: Take 125 mg by mouth 2 (two) times daily. As needed), Disp: 10 tablet, Rfl: 0   albuterol  (VENTOLIN  HFA) 108 (90 Base) MCG/ACT inhaler, Inhale 2 puffs into the lungs every 4 (four) hours as needed for wheezing or shortness of breath., Disp: 18 g, Rfl: 6   aspirin  EC 81 MG tablet, Take 81 mg by mouth at bedtime., Disp: , Rfl:    atorvastatin  (LIPITOR) 40 MG tablet, TAKE 1 TABLET BY MOUTH DAILY, Disp: 100 tablet, Rfl: 2   Bacillus Coagulans-Inulin (PROBIOTIC) 1-250 BILLION-MG CAPS, , Disp: , Rfl:    Calcium  Carbonate-Vitamin D (CALCIUM -VITAMIN D3 PO), Take 1 tablet by mouth in the morning and at bedtime., Disp: , Rfl:    Cyanocobalamin (VITAMIN B-12 PO), Take 1 tablet by mouth in the morning., Disp: , Rfl:    famotidine  (PEPCID ) 20 MG tablet, Take 20 mg by mouth in the morning., Disp: , Rfl:    fexofenadine (ALLEGRA) 180 MG tablet, Take 180 mg by mouth as needed for allergies or rhinitis., Disp: , Rfl:    hydrocortisone  (ANUSOL -HC) 25 MG suppository, Place 1 suppository (25 mg total) rectally every 12 (twelve) hours., Disp: 14 suppository, Rfl: 2   levothyroxine  (SYNTHROID ) 75 MCG tablet, TAKE 1 TABLET BY MOUTH DAILY  BEFORE BREAKFAST, Disp: 100 tablet, Rfl: 2   losartan  (COZAAR ) 25 MG tablet,  TAKE 1 TABLET BY MOUTH DAILY, Disp: 90 tablet, Rfl: 3   meclizine  (ANTIVERT ) 25 MG tablet, Take 1 tablet (25 mg total) by mouth 3 (three) times daily as needed for dizziness., Disp: 20 tablet, Rfl: 0   MIEBO 1.338 GM/ML SOLN, , Disp: , Rfl:    Multiple Vitamin (MULTI-VITAMINS) TABS, Take 1 tablet by mouth in the morning., Disp: , Rfl:    pantoprazole  (PROTONIX ) 40 MG tablet, Take 1 tablet (40 mg total) by mouth 2 (two) times daily., Disp: 180 tablet, Rfl: 3   REPATHA  SURECLICK 140 MG/ML SOAJ, INJECT 1 PEN SUBCUTANEOUSLY  EVERY 2 WEEKS, Disp: 6 mL, Rfl: 3   triamcinolone  ointment (KENALOG ) 0.5 %, Apply 1 Application topically 2 (two) times daily. (Patient taking differently: Apply 1 Application topically 2 (two) times daily. As needed), Disp: 60 g, Rfl: 1   VAGINAL LUBRICANT VA, Place vaginally., Disp: , Rfl:    valACYclovir (VALTREX) 500 MG tablet, Take 500 mg by mouth See admin instructions. Take 1 tablet (500 mg) by mouth twice daily x 5 day at onset of outbreak., Disp: , Rfl:    Glucos-Chond-MSM-Bor-D3-Hyalur (MOVE FREE JOINT HEALTH ADV + D PO), , Disp: , Rfl:   "

## 2024-04-05 NOTE — Patient Instructions (Addendum)
"    ICD-10-CM   1. Scleroderma (HCC)  M34.9     2. Dyspnea and respiratory abnormalities  R06.00    R06.89        November 2024 CT chest without any interstitial lung disease and clear but in December 2024 at ER visit CT angiogram rule out pulmonary embolism but did have groundglass opacities.  You seem to have recovered from that after increasing her acid reflux medicine.  And in December 2025 CT scan of the chest is improved but you do have air trapping which she also had in December 2024 but you did not have in November 2024  However you remain asymptomatic  Noted that you have down comforter exposure   Plan - Get rid of down comforter -Do spirometry and DLCO in 9 months -Continued monitoring approach recommended   followup - 9 spirometry and DLCO and return to see Dr. Geronimo in a 15-minute visit "

## 2024-04-12 ENCOUNTER — Encounter: Payer: Self-pay | Admitting: Family Medicine

## 2024-04-22 NOTE — Progress Notes (Unsigned)
" ° ° °  SUBJECTIVE:   CHIEF COMPLAINT / HPI:   Discussed the use of AI scribe software for clinical note transcription with the patient, who gave verbal consent to proceed.  History of Present Illness Rhonda Brewer is a 74 year old female who presents with shoulder pain.  Left shoulder pain - Dull pain present for approximately five months - Worsened by overhead movements, taking off her shirt, and lifting heavy objects - Associated with weakness and radiation throughout the shoulder - Persistent pain, not improved with ice, heat, or activity modification - Pain can wake her at night with sudden movements - No recent trauma or change in activities preceding onset - does work out with trainer, has had to modify workouts due to pain. - Tylenol  provides minimal relief - Occasionally uses Advil for more intense pain, helps - Limits Advil use due to reflux and concern for stomach problems - h/o scleroderma and CAD not on immunosuppressive medications or blood thinners.    OBJECTIVE:   BP 128/70   Pulse 70   Ht 5' 4 (1.626 m)   Wt 148 lb 3.2 oz (67.2 kg)   SpO2 98%   BMI 25.44 kg/m   Gen: well appearing, in NAD Card: RRR Lungs: CTAB Ext: WWP, no edema L shoulder:  Inspection: no visible swelling, asymmetry Palpation: non TTP globally ROM: reduced active and passive ROM in flexion, extension, abduction, internal rotation due to pain Strength: 5/5 UE and grip strength Stability: no joint laxity. Special Tests: +Neer, +Hawkins. Negative Speeds. Neurovascular: intact.   ASSESSMENT/PLAN:   Acute pain of left shoulder Subacute at this point. Unclear etiology, possible overuse with frequent workouts? Testing concerning for possible rotator cuff impingement. Recommend scheduled NSAIDS x5 days, if reflux symptoms worsen in the meantime, recommend stopping. Will refer to Sports Med for dedicated ultrasound +/- CSI if no added benefit from or intolerance to NSAID.     [name], DO "

## 2024-04-23 ENCOUNTER — Ambulatory Visit: Admitting: Family Medicine

## 2024-04-23 ENCOUNTER — Encounter: Payer: Self-pay | Admitting: Family Medicine

## 2024-04-23 VITALS — BP 128/70 | HR 70 | Ht 64.0 in | Wt 148.2 lb

## 2024-04-23 DIAGNOSIS — M25512 Pain in left shoulder: Secondary | ICD-10-CM | POA: Insufficient documentation

## 2024-04-23 NOTE — Assessment & Plan Note (Signed)
 Subacute at this point. Unclear etiology, possible overuse with frequent workouts? Testing concerning for possible rotator cuff impingement. Recommend scheduled NSAIDS x5 days, if reflux symptoms worsen in the meantime, recommend stopping. Will refer to Sports Med for dedicated ultrasound +/- CSI if no added benefit from or intolerance to NSAID.

## 2024-04-23 NOTE — Patient Instructions (Addendum)
 It was great to see you!  Our plans for today:  - We are referring you to Sports Medicine. Let us  know if you don't hear about an appointment in the next few weeks.  - Take aleve every 12 hours for the next 5 days. If this exacerbates your reflux symptoms, stop taking.  - Schedule a physical whenever is good for you and Geoff.  Take care and seek immediate care sooner if you develop any concerns.   Dr. Paisyn Guercio

## 2024-06-03 ENCOUNTER — Encounter

## 2024-06-04 ENCOUNTER — Encounter: Admitting: Family Medicine

## 2024-06-09 ENCOUNTER — Ambulatory Visit: Admitting: Rheumatology
